# Patient Record
Sex: Female | Born: 1965 | Race: White | Hispanic: No | Marital: Married | Smoking: Current every day smoker
Health system: Southern US, Community
[De-identification: ages and names within clinical notes are randomized; demographics above are authoritative.]

## PROBLEM LIST (undated history)

## (undated) DIAGNOSIS — E559 Vitamin D deficiency, unspecified: Secondary | ICD-10-CM

## (undated) DIAGNOSIS — M26609 Unspecified temporomandibular joint disorder, unspecified side: Secondary | ICD-10-CM

## (undated) DIAGNOSIS — R232 Flushing: Secondary | ICD-10-CM

## (undated) DIAGNOSIS — F17200 Nicotine dependence, unspecified, uncomplicated: Secondary | ICD-10-CM

## (undated) DIAGNOSIS — M79659 Pain in unspecified thigh: Secondary | ICD-10-CM

## (undated) DIAGNOSIS — J189 Pneumonia, unspecified organism: Secondary | ICD-10-CM

## (undated) DIAGNOSIS — M2550 Pain in unspecified joint: Secondary | ICD-10-CM

## (undated) DIAGNOSIS — G4730 Sleep apnea, unspecified: Secondary | ICD-10-CM

## (undated) DIAGNOSIS — F32A Depression, unspecified: Secondary | ICD-10-CM

## (undated) DIAGNOSIS — K219 Gastro-esophageal reflux disease without esophagitis: Secondary | ICD-10-CM

## (undated) DIAGNOSIS — J309 Allergic rhinitis, unspecified: Secondary | ICD-10-CM

## (undated) DIAGNOSIS — E785 Hyperlipidemia, unspecified: Secondary | ICD-10-CM

## (undated) DIAGNOSIS — K589 Irritable bowel syndrome without diarrhea: Secondary | ICD-10-CM

## (undated) DIAGNOSIS — R202 Paresthesia of skin: Secondary | ICD-10-CM

## (undated) DIAGNOSIS — E119 Type 2 diabetes mellitus without complications: Secondary | ICD-10-CM

## (undated) DIAGNOSIS — I10 Essential (primary) hypertension: Secondary | ICD-10-CM

## (undated) DIAGNOSIS — M7918 Myalgia, other site: Secondary | ICD-10-CM

## (undated) DIAGNOSIS — K760 Fatty (change of) liver, not elsewhere classified: Secondary | ICD-10-CM

## (undated) DIAGNOSIS — R200 Anesthesia of skin: Secondary | ICD-10-CM

## (undated) DIAGNOSIS — M1990 Unspecified osteoarthritis, unspecified site: Secondary | ICD-10-CM

## (undated) DIAGNOSIS — K5900 Constipation, unspecified: Secondary | ICD-10-CM

## (undated) DIAGNOSIS — K143 Hypertrophy of tongue papillae: Secondary | ICD-10-CM

## (undated) DIAGNOSIS — M5432 Sciatica, left side: Secondary | ICD-10-CM

## (undated) DIAGNOSIS — K449 Diaphragmatic hernia without obstruction or gangrene: Secondary | ICD-10-CM

## (undated) DIAGNOSIS — M549 Dorsalgia, unspecified: Secondary | ICD-10-CM

## (undated) HISTORY — DX: Nicotine dependence, unspecified, uncomplicated: F17.200

## (undated) HISTORY — PX: BREAST BIOPSY: SHX20

## (undated) HISTORY — DX: Fatty (change of) liver, not elsewhere classified: K76.0

## (undated) HISTORY — PX: BREAST EXCISIONAL BIOPSY: SUR124

## (undated) HISTORY — PX: LAPAROSCOPIC NISSEN FUNDOPLICATION: SHX1932

## (undated) HISTORY — PX: ESOPHAGEAL MANOMETRY: SHX5429

## (undated) HISTORY — PX: CERVICAL FUSION: SHX112

## (undated) HISTORY — DX: Type 2 diabetes mellitus without complications: E11.9

## (undated) HISTORY — PX: COLONOSCOPY: SHX174

## (undated) HISTORY — PX: HERNIA REPAIR: SHX51

## (undated) HISTORY — PX: TONSILLECTOMY AND ADENOIDECTOMY: SUR1326

## (undated) HISTORY — DX: Diaphragmatic hernia without obstruction or gangrene: K44.9

## (undated) HISTORY — DX: Vitamin D deficiency, unspecified: E55.9

## (undated) HISTORY — PX: ESOPHAGOGASTRODUODENOSCOPY: SHX1529

## (undated) HISTORY — DX: Gastro-esophageal reflux disease without esophagitis: K21.9

## (undated) HISTORY — DX: Unspecified temporomandibular joint disorder, unspecified side: M26.609

## (undated) HISTORY — PX: BREAST SURGERY: SHX581

## (undated) HISTORY — DX: Constipation, unspecified: K59.00

## (undated) HISTORY — PX: GASTRIC FUNDOPLICATION: SHX226

## (undated) HISTORY — DX: Dorsalgia, unspecified: M54.9

## (undated) HISTORY — PX: DILATION AND CURETTAGE OF UTERUS: SHX78

## (undated) HISTORY — DX: Essential (primary) hypertension: I10

## (undated) HISTORY — DX: Sciatica, left side: M54.32

## (undated) HISTORY — PX: CARPAL TUNNEL RELEASE: SHX101

## (undated) HISTORY — PX: LUMBAR DISC SURGERY: SHX700

## (undated) HISTORY — DX: Pain in unspecified joint: M25.50

## (undated) HISTORY — PX: UPPER GASTROINTESTINAL ENDOSCOPY: SHX188

## (undated) HISTORY — DX: Flushing: R23.2

## (undated) HISTORY — DX: Hyperlipidemia, unspecified: E78.5

## (undated) HISTORY — PX: APPENDECTOMY: SHX54

## (undated) HISTORY — DX: Unspecified osteoarthritis, unspecified site: M19.90

## (undated) HISTORY — DX: Allergic rhinitis, unspecified: J30.9

## (undated) HISTORY — DX: Irritable bowel syndrome, unspecified: K58.9

## (undated) HISTORY — DX: Hypertrophy of tongue papillae: K14.3

---

## 1999-10-26 ENCOUNTER — Encounter: Payer: Self-pay | Admitting: Neurosurgery

## 1999-10-26 ENCOUNTER — Inpatient Hospital Stay (HOSPITAL_COMMUNITY): Admission: RE | Admit: 1999-10-26 | Discharge: 1999-10-27 | Payer: Self-pay | Admitting: Neurosurgery

## 2003-12-12 HISTORY — PX: LUMBAR LAMINECTOMY: SHX95

## 2004-08-27 ENCOUNTER — Ambulatory Visit (HOSPITAL_COMMUNITY): Admission: RE | Admit: 2004-08-27 | Discharge: 2004-08-27 | Payer: Self-pay | Admitting: Family Medicine

## 2004-11-08 ENCOUNTER — Other Ambulatory Visit: Admission: RE | Admit: 2004-11-08 | Discharge: 2004-11-08 | Payer: Self-pay | Admitting: Family Medicine

## 2005-08-08 ENCOUNTER — Other Ambulatory Visit: Admission: RE | Admit: 2005-08-08 | Discharge: 2005-08-08 | Payer: Self-pay | Admitting: Obstetrics and Gynecology

## 2005-08-28 ENCOUNTER — Encounter: Admission: RE | Admit: 2005-08-28 | Discharge: 2005-08-28 | Payer: Self-pay | Admitting: General Surgery

## 2005-08-28 ENCOUNTER — Encounter (INDEPENDENT_AMBULATORY_CARE_PROVIDER_SITE_OTHER): Payer: Self-pay | Admitting: *Deleted

## 2006-08-06 ENCOUNTER — Ambulatory Visit (HOSPITAL_COMMUNITY): Admission: RE | Admit: 2006-08-06 | Discharge: 2006-08-07 | Payer: Self-pay | Admitting: Neurological Surgery

## 2006-10-04 ENCOUNTER — Ambulatory Visit (HOSPITAL_COMMUNITY): Admission: RE | Admit: 2006-10-04 | Discharge: 2006-10-04 | Payer: Self-pay | Admitting: Neurological Surgery

## 2007-10-13 ENCOUNTER — Encounter (INDEPENDENT_AMBULATORY_CARE_PROVIDER_SITE_OTHER): Payer: Self-pay | Admitting: General Surgery

## 2007-10-13 ENCOUNTER — Observation Stay (HOSPITAL_COMMUNITY): Admission: EM | Admit: 2007-10-13 | Discharge: 2007-10-14 | Payer: Self-pay | Admitting: Emergency Medicine

## 2008-12-01 ENCOUNTER — Ambulatory Visit: Payer: Self-pay | Admitting: Internal Medicine

## 2008-12-01 DIAGNOSIS — F17200 Nicotine dependence, unspecified, uncomplicated: Secondary | ICD-10-CM | POA: Insufficient documentation

## 2008-12-01 DIAGNOSIS — M1990 Unspecified osteoarthritis, unspecified site: Secondary | ICD-10-CM | POA: Insufficient documentation

## 2008-12-01 DIAGNOSIS — K219 Gastro-esophageal reflux disease without esophagitis: Secondary | ICD-10-CM

## 2008-12-01 DIAGNOSIS — J069 Acute upper respiratory infection, unspecified: Secondary | ICD-10-CM | POA: Insufficient documentation

## 2008-12-01 HISTORY — DX: Unspecified osteoarthritis, unspecified site: M19.90

## 2008-12-01 HISTORY — DX: Nicotine dependence, unspecified, uncomplicated: F17.200

## 2008-12-01 HISTORY — DX: Gastro-esophageal reflux disease without esophagitis: K21.9

## 2008-12-07 LAB — CONVERTED CEMR LAB
ALT: 20 units/L (ref 0–35)
AST: 21 units/L (ref 0–37)
Albumin: 4.4 g/dL (ref 3.5–5.2)
Alkaline Phosphatase: 62 units/L (ref 39–117)
BUN: 11 mg/dL (ref 6–23)
Basophils Absolute: 0 10*3/uL (ref 0.0–0.1)
Basophils Relative: 0.3 % (ref 0.0–3.0)
Bilirubin, Direct: 0.1 mg/dL (ref 0.0–0.3)
CO2: 28 meq/L (ref 19–32)
Calcium: 9.6 mg/dL (ref 8.4–10.5)
Chloride: 105 meq/L (ref 96–112)
Cholesterol: 213 mg/dL (ref 0–200)
Creatinine, Ser: 0.7 mg/dL (ref 0.4–1.2)
Direct LDL: 149.3 mg/dL
Eosinophils Absolute: 0.1 10*3/uL (ref 0.0–0.7)
Eosinophils Relative: 1 % (ref 0.0–5.0)
GFR calc Af Amer: 117 mL/min
GFR calc non Af Amer: 97 mL/min
Glucose, Bld: 96 mg/dL (ref 70–99)
HCT: 41.8 % (ref 36.0–46.0)
Hemoglobin: 14.3 g/dL (ref 12.0–15.0)
Lymphocytes Relative: 27.1 % (ref 12.0–46.0)
MCHC: 34.3 g/dL (ref 30.0–36.0)
MCV: 95.2 fL (ref 78.0–100.0)
Monocytes Absolute: 0.6 10*3/uL (ref 0.1–1.0)
Monocytes Relative: 5.3 % (ref 3.0–12.0)
Neutro Abs: 8.1 10*3/uL — ABNORMAL HIGH (ref 1.4–7.7)
Neutrophils Relative %: 66.3 % (ref 43.0–77.0)
Platelets: 228 10*3/uL (ref 150–400)
Potassium: 3.9 meq/L (ref 3.5–5.1)
RBC: 4.39 M/uL (ref 3.87–5.11)
RDW: 13 % (ref 11.5–14.6)
Sodium: 142 meq/L (ref 135–145)
TSH: 0.5 microintl units/mL (ref 0.35–5.50)
Total Bilirubin: 0.8 mg/dL (ref 0.3–1.2)
Total Protein: 7.5 g/dL (ref 6.0–8.3)
WBC: 12.1 10*3/uL — ABNORMAL HIGH (ref 4.5–10.5)

## 2008-12-08 ENCOUNTER — Ambulatory Visit: Payer: Self-pay | Admitting: Family Medicine

## 2008-12-08 DIAGNOSIS — M26609 Unspecified temporomandibular joint disorder, unspecified side: Secondary | ICD-10-CM

## 2008-12-08 HISTORY — DX: Unspecified temporomandibular joint disorder, unspecified side: M26.609

## 2009-04-06 ENCOUNTER — Ambulatory Visit: Payer: Self-pay | Admitting: Internal Medicine

## 2009-04-06 DIAGNOSIS — R5381 Other malaise: Secondary | ICD-10-CM | POA: Insufficient documentation

## 2009-04-06 DIAGNOSIS — R5383 Other fatigue: Secondary | ICD-10-CM

## 2009-04-07 ENCOUNTER — Telehealth: Payer: Self-pay | Admitting: Internal Medicine

## 2009-04-08 LAB — CONVERTED CEMR LAB
Basophils Absolute: 0 10*3/uL (ref 0.0–0.1)
Basophils Relative: 0 % (ref 0.0–3.0)
Eosinophils Absolute: 0.2 10*3/uL (ref 0.0–0.7)
Eosinophils Relative: 2 % (ref 0.0–5.0)
HCT: 37.9 % (ref 36.0–46.0)
Hemoglobin: 13.2 g/dL (ref 12.0–15.0)
Lymphocytes Relative: 40.8 % (ref 12.0–46.0)
Lymphs Abs: 4.2 10*3/uL — ABNORMAL HIGH (ref 0.7–4.0)
MCHC: 34.9 g/dL (ref 30.0–36.0)
MCV: 93.4 fL (ref 78.0–100.0)
Monocytes Absolute: 0.7 10*3/uL (ref 0.1–1.0)
Monocytes Relative: 6.9 % (ref 3.0–12.0)
Neutro Abs: 5.3 10*3/uL (ref 1.4–7.7)
Neutrophils Relative %: 50.3 % (ref 43.0–77.0)
Platelets: 222 10*3/uL (ref 150.0–400.0)
RBC: 4.06 M/uL (ref 3.87–5.11)
RDW: 12.6 % (ref 11.5–14.6)
TSH: 0.95 microintl units/mL (ref 0.35–5.50)
WBC: 10.4 10*3/uL (ref 4.5–10.5)

## 2009-04-19 ENCOUNTER — Encounter: Payer: Self-pay | Admitting: Internal Medicine

## 2009-05-05 ENCOUNTER — Telehealth: Payer: Self-pay | Admitting: Internal Medicine

## 2009-10-18 ENCOUNTER — Ambulatory Visit: Payer: Self-pay | Admitting: Internal Medicine

## 2009-10-18 LAB — CONVERTED CEMR LAB
Bilirubin Urine: NEGATIVE
Blood in Urine, dipstick: NEGATIVE
Glucose, Urine, Semiquant: NEGATIVE
Ketones, urine, test strip: NEGATIVE
Nitrite: NEGATIVE
Protein, U semiquant: NEGATIVE
Specific Gravity, Urine: <1.005
Urobilinogen, UA: 0.2
WBC Urine, dipstick: NEGATIVE
pH: 6

## 2009-10-20 ENCOUNTER — Encounter: Payer: Self-pay | Admitting: Internal Medicine

## 2009-10-20 ENCOUNTER — Telehealth: Payer: Self-pay | Admitting: Internal Medicine

## 2009-11-02 ENCOUNTER — Ambulatory Visit: Payer: Self-pay | Admitting: Internal Medicine

## 2009-11-03 ENCOUNTER — Telehealth: Payer: Self-pay | Admitting: Internal Medicine

## 2010-01-24 ENCOUNTER — Ambulatory Visit: Payer: Self-pay | Admitting: Internal Medicine

## 2010-01-24 DIAGNOSIS — M549 Dorsalgia, unspecified: Secondary | ICD-10-CM

## 2010-01-24 HISTORY — DX: Dorsalgia, unspecified: M54.9

## 2010-02-03 ENCOUNTER — Encounter: Payer: Self-pay | Admitting: Internal Medicine

## 2010-02-08 ENCOUNTER — Encounter: Payer: Self-pay | Admitting: Internal Medicine

## 2010-03-08 ENCOUNTER — Telehealth: Payer: Self-pay | Admitting: Internal Medicine

## 2010-03-14 ENCOUNTER — Ambulatory Visit: Payer: Self-pay | Admitting: Family Medicine

## 2010-03-14 DIAGNOSIS — J309 Allergic rhinitis, unspecified: Secondary | ICD-10-CM | POA: Insufficient documentation

## 2010-03-14 DIAGNOSIS — K143 Hypertrophy of tongue papillae: Secondary | ICD-10-CM

## 2010-03-14 HISTORY — DX: Allergic rhinitis, unspecified: J30.9

## 2010-03-14 HISTORY — DX: Hypertrophy of tongue papillae: K14.3

## 2010-03-14 LAB — CONVERTED CEMR LAB: Vitamin B-12: 256 pg/mL (ref 211–911)

## 2010-03-17 LAB — CONVERTED CEMR LAB: Vit D, 25-Hydroxy: 25 ng/mL — ABNORMAL LOW (ref 30–89)

## 2010-04-04 ENCOUNTER — Ambulatory Visit (HOSPITAL_COMMUNITY): Admission: RE | Admit: 2010-04-04 | Discharge: 2010-04-04 | Payer: Self-pay | Admitting: Neurological Surgery

## 2010-05-23 ENCOUNTER — Ambulatory Visit: Payer: Self-pay | Admitting: Internal Medicine

## 2010-05-23 ENCOUNTER — Encounter (INDEPENDENT_AMBULATORY_CARE_PROVIDER_SITE_OTHER): Payer: Self-pay | Admitting: *Deleted

## 2010-05-23 DIAGNOSIS — E559 Vitamin D deficiency, unspecified: Secondary | ICD-10-CM

## 2010-05-23 HISTORY — DX: Vitamin D deficiency, unspecified: E55.9

## 2010-05-23 LAB — CONVERTED CEMR LAB: Vit D, 25-Hydroxy: 36 ng/mL (ref 30–89)

## 2010-05-27 ENCOUNTER — Ambulatory Visit: Payer: Self-pay | Admitting: Gastroenterology

## 2010-05-27 DIAGNOSIS — K449 Diaphragmatic hernia without obstruction or gangrene: Secondary | ICD-10-CM | POA: Insufficient documentation

## 2010-05-27 HISTORY — DX: Diaphragmatic hernia without obstruction or gangrene: K44.9

## 2010-05-31 ENCOUNTER — Ambulatory Visit (HOSPITAL_COMMUNITY): Admission: RE | Admit: 2010-05-31 | Discharge: 2010-05-31 | Payer: Self-pay | Admitting: Gastroenterology

## 2010-06-22 ENCOUNTER — Encounter: Payer: Self-pay | Admitting: Internal Medicine

## 2010-06-23 ENCOUNTER — Telehealth: Payer: Self-pay | Admitting: Gastroenterology

## 2010-07-09 ENCOUNTER — Telehealth: Payer: Self-pay | Admitting: Physician Assistant

## 2010-07-12 ENCOUNTER — Ambulatory Visit (HOSPITAL_COMMUNITY): Admission: RE | Admit: 2010-07-12 | Discharge: 2010-07-12 | Payer: Self-pay | Admitting: Gastroenterology

## 2010-07-13 ENCOUNTER — Telehealth: Payer: Self-pay | Admitting: Gastroenterology

## 2010-07-21 ENCOUNTER — Encounter (INDEPENDENT_AMBULATORY_CARE_PROVIDER_SITE_OTHER): Payer: Self-pay | Admitting: *Deleted

## 2010-07-21 ENCOUNTER — Ambulatory Visit: Payer: Self-pay | Admitting: Gastroenterology

## 2010-07-22 ENCOUNTER — Ambulatory Visit: Payer: Self-pay | Admitting: Internal Medicine

## 2010-07-22 LAB — CONVERTED CEMR LAB
ALT: 24 units/L (ref 0–35)
AST: 21 units/L (ref 0–37)
Albumin: 4.2 g/dL (ref 3.5–5.2)
Alkaline Phosphatase: 57 units/L (ref 39–117)
BUN: 12 mg/dL (ref 6–23)
Basophils Absolute: 0.1 10*3/uL (ref 0.0–0.1)
Basophils Relative: 0.4 % (ref 0.0–3.0)
Bilirubin Urine: NEGATIVE
Bilirubin, Direct: 0.1 mg/dL (ref 0.0–0.3)
CO2: 28 meq/L (ref 19–32)
Calcium: 9.7 mg/dL (ref 8.4–10.5)
Chloride: 105 meq/L (ref 96–112)
Cholesterol: 203 mg/dL — ABNORMAL HIGH (ref 0–200)
Creatinine, Ser: 0.7 mg/dL (ref 0.4–1.2)
Direct LDL: 153.7 mg/dL
Eosinophils Absolute: 0.1 10*3/uL (ref 0.0–0.7)
Eosinophils Relative: 0.6 % (ref 0.0–5.0)
GFR calc non Af Amer: 94.43 mL/min (ref >60)
Glucose, Bld: 89 mg/dL (ref 70–99)
Glucose, Urine, Semiquant: NEGATIVE
HCT: 41.1 % (ref 36.0–46.0)
HDL: 36.1 mg/dL — ABNORMAL LOW (ref >39.00)
Hemoglobin: 14 g/dL (ref 12.0–15.0)
Ketones, urine, test strip: NEGATIVE
Lymphocytes Relative: 32.6 % (ref 12.0–46.0)
Lymphs Abs: 4.6 10*3/uL — ABNORMAL HIGH (ref 0.7–4.0)
MCHC: 34.1 g/dL (ref 30.0–36.0)
MCV: 96 fL (ref 78.0–100.0)
Monocytes Absolute: 0.7 10*3/uL (ref 0.1–1.0)
Monocytes Relative: 5.3 % (ref 3.0–12.0)
Neutro Abs: 8.6 10*3/uL — ABNORMAL HIGH (ref 1.4–7.7)
Neutrophils Relative %: 61.1 % (ref 43.0–77.0)
Nitrite: NEGATIVE
Platelets: 230 10*3/uL (ref 150.0–400.0)
Potassium: 4.7 meq/L (ref 3.5–5.1)
Protein, U semiquant: NEGATIVE
RBC: 4.28 M/uL (ref 3.87–5.11)
RDW: 14.3 % (ref 11.5–14.6)
Sodium: 142 meq/L (ref 135–145)
Specific Gravity, Urine: 1.02
TSH: 1.16 microintl units/mL (ref 0.35–5.50)
Total Bilirubin: 0.5 mg/dL (ref 0.3–1.2)
Total CHOL/HDL Ratio: 6
Total Protein: 7 g/dL (ref 6.0–8.3)
Triglycerides: 204 mg/dL — ABNORMAL HIGH (ref 0.0–149.0)
Urobilinogen, UA: 0.2
VLDL: 40.8 mg/dL — ABNORMAL HIGH (ref 0.0–40.0)
WBC Urine, dipstick: NEGATIVE
WBC: 14 10*3/uL — ABNORMAL HIGH (ref 4.5–10.5)
pH: 7

## 2010-07-26 ENCOUNTER — Ambulatory Visit (HOSPITAL_COMMUNITY): Admission: RE | Admit: 2010-07-26 | Discharge: 2010-07-26 | Payer: Self-pay | Admitting: Gastroenterology

## 2010-07-26 ENCOUNTER — Ambulatory Visit: Payer: Self-pay | Admitting: Gastroenterology

## 2010-07-29 ENCOUNTER — Ambulatory Visit: Payer: Self-pay | Admitting: Internal Medicine

## 2010-08-19 ENCOUNTER — Encounter: Payer: Self-pay | Admitting: Gastroenterology

## 2010-08-24 ENCOUNTER — Encounter: Payer: Self-pay | Admitting: Gastroenterology

## 2010-08-29 ENCOUNTER — Encounter
Admission: RE | Admit: 2010-08-29 | Discharge: 2010-11-27 | Payer: Self-pay | Source: Home / Self Care | Attending: Neurological Surgery | Admitting: Neurological Surgery

## 2010-09-22 ENCOUNTER — Ambulatory Visit: Payer: Self-pay | Admitting: Internal Medicine

## 2010-09-26 ENCOUNTER — Ambulatory Visit (HOSPITAL_COMMUNITY): Admission: RE | Admit: 2010-09-26 | Discharge: 2010-09-26 | Payer: Self-pay | Admitting: Surgery

## 2010-09-28 ENCOUNTER — Telehealth: Payer: Self-pay | Admitting: Internal Medicine

## 2010-10-07 ENCOUNTER — Telehealth: Payer: Self-pay | Admitting: Internal Medicine

## 2010-10-26 ENCOUNTER — Encounter: Payer: Self-pay | Admitting: Gastroenterology

## 2010-11-23 ENCOUNTER — Inpatient Hospital Stay (HOSPITAL_COMMUNITY)
Admission: RE | Admit: 2010-11-23 | Discharge: 2010-11-26 | Payer: Self-pay | Source: Home / Self Care | Attending: Surgery | Admitting: Surgery

## 2010-12-15 ENCOUNTER — Encounter: Payer: Self-pay | Admitting: Internal Medicine

## 2011-01-10 NOTE — Letter (Signed)
Summary: Application for Wage Replacement/[employer]  Application for Wage Replacement/[employer]   Imported By: Maryln Gottron 02/09/2010 15:48:30  _____________________________________________________________________  External Attachment:    Type:   Image     Comment:   External Document

## 2011-01-10 NOTE — Progress Notes (Signed)
Summary: pt req out of work letter for rest of the week  Phone Note Call from Patient Call back at [employer] [phone]   Caller: Patient Summary of Call: Pt called and said that she is not feeling any better. Pt is wanting Dr. Amador Cunas to do an out of work letter for the rest of this week. Please call.  Initial call taken by: Lucy Antigua,  October 20, 2009 10:48 AM  Follow-up for Phone Call        not better- still having sweats, weak. Diarrhea better, still has cough. Want to return to work 11/15 if you will write her a note. Follow-up by: [name], RN,  October 20, 2009 11:25 AM    ok

## 2011-01-10 NOTE — Letter (Signed)
Summary: [facility]  [facility]   Imported By: Lester Trempealeau 11/11/2010 10:18:05  _____________________________________________________________________  External Attachment:    Type:   Image     Comment:   External Document

## 2011-01-10 NOTE — Letter (Signed)
Summary: Out of Work  Adult nurse at [employer]  [address]   [place], [place] [address]   Phone: [phone]  Fax: [phone]    October 20, 2009   Employee:  BRITTINY LEVITZ    To Whom It May Concern:   For Medical reasons, please excuse the above named employee from work for the following dates:  Start:   10-14-2009  End:   10-23-2009  If you need additional information, please feel free to contact our office.         Sincerely,    Gordy Savers  MD

## 2011-01-10 NOTE — Assessment & Plan Note (Signed)
Summary: acid reflux//ccm   Vital Signs:  Patient profile:   45 year old female Weight:      196 pounds Temp:     98.0 degrees F oral BP sitting:   100 / 70  (right arm) Cuff size:   regular  Vitals Entered By: Duard Brady LPN (May 23, 2010 11:05 AM) CC: c/o reflux getting worse, choking feeling Is Patient Diabetic? No   CC:  c/o reflux getting worse and choking feeling.  History of Present Illness: 45 year old patient who is seen today for follow-up.  She has multiple somatic complaints.  She does have a history of significant reflux.  Has had a difficult time getting preauthorization for Earna Coder had twice daily.  She has had ENT and GI evaluation in the past.  She is followed closely by neurosurgery due to cervical disk disease.  She has osteoarthritis and ongoing tobacco use.  Allergies: 1)  ! * Torecan  Past History:  Past Medical History: GERD since 1988  Osteoarthritis tobacco use Cervical fusion 07-2009  Past Surgical History: Appendectomy 2009 Lumpectomy 2001 Lumbar laminectomy 2000 Cervical fusion 2007, 07-2009 gravida two, para one, abortus one, status post C-section 1988  colonoscopy x 2 ( November 2007)- [facility]  Family History: Reviewed history from 12/01/2008 and no changes required.  father died age 47, complications of pneumonia ;history of EtOH mothered died age 72, history of coronary artery disease, breast cancer, hypertension, impaired glucose tolerance  A number of half-brothers and half-sisters from both her mother's and father's side of the family:  positive for colonic polyps, hypertension, alcoholism; coronary artery disease, peripheral vascular occlusive disease  Review of Systems       The patient complains of weight gain and severe indigestion/heartburn.  The patient denies anorexia, fever, weight loss, vision loss, decreased hearing, hoarseness, chest pain, syncope, dyspnea on exertion, peripheral edema, prolonged cough, headaches,  hemoptysis, abdominal pain, melena, hematochezia, hematuria, incontinence, genital sores, muscle weakness, suspicious skin lesions, transient blindness, difficulty walking, depression, unusual weight change, abnormal bleeding, enlarged lymph nodes, angioedema, and breast masses.    Physical Exam  General:  overweight-appearing.  normal  blood pressureoverweight-appearing.   Head:  Normocephalic and atraumatic without obvious abnormalities. No apparent alopecia or balding. Eyes:  No corneal or conjunctival inflammation noted. EOMI. Perrla. Funduscopic exam benign, without hemorrhages, exudates or papilledema. Vision grossly normal. Mouth:  Oral mucosa and oropharynx without lesions or exudates.  Teeth in good repair. Neck:  No deformities, masses, or tenderness noted. Lungs:  Normal respiratory effort, chest expands symmetrically. Lungs are clear to auscultation, no crackles or wheezes. Heart:  Normal rate and regular rhythm. S1 and S2 normal without gallop, murmur, click, rub or other extra sounds. Abdomen:  Bowel sounds positive,abdomen soft and non-tender without masses, organomegaly or hernias noted. Msk:  No deformity or scoliosis noted of thoracic or lumbar spine.   Pulses:  R and L carotid,radial,femoral,dorsalis pedis and posterior tibial pulses are full and equal bilaterally Extremities:  No clubbing, cyanosis, edema, or deformity noted with normal full range of motion of all joints.     Impression & Recommendations:  Problem # 1:  ALLERGIC RHINITIS (ICD-477.9)  The following medications were removed from the medication list:    Nasonex 50 Mcg/act Susp (Mometasone furoate) .Marland Kitchen... 2 sprays ea nostril two times a day Her updated medication list for this problem includes:    Fluticasone Propionate 50 Mcg/act Susp (Fluticasone propionate) ..... Use daily  The following medications were removed from the medication list:  Nasonex 50 Mcg/act Susp (Mometasone furoate) .Marland Kitchen... 2 sprays ea  nostril two times a day Her updated medication list for this problem includes:    Fluticasone Propionate 50 Mcg/act Susp (Fluticasone propionate) ..... Use daily  Problem # 2:  TOBACCO USE (ICD-305.1)  Problem # 3:  OSTEOARTHRITIS (ICD-715.90)  Her updated medication list for this problem includes:    Tramadol Hcl 50 Mg Tabs (Tramadol hcl) ..... One every 6 hours as needed for pain    Percocet 10-325 Mg Tabs (Oxycodone-acetaminophen) .Marland Kitchen... Prn  Her updated medication list for this problem includes:    Tramadol Hcl 50 Mg Tabs (Tramadol hcl) ..... One every 6 hours as needed for pain    Percocet 10-325 Mg Tabs (Oxycodone-acetaminophen) .Marland Kitchen... Prn  Problem # 4:  GERD (ICD-530.81)  Her updated medication list for this problem includes:    Zegerid 40-1100 Mg Caps (Omeprazole-sodium bicarbonate) .Marland Kitchen... 1 two times a day  Her updated medication list for this problem includes:    Zegerid 40-1100 Mg Caps (Omeprazole-sodium bicarbonate) .Marland Kitchen... 1 two times a day  Complete Medication List: 1)  Zegerid 40-1100 Mg Caps (Omeprazole-sodium bicarbonate) .Marland Kitchen.. 1 two times a day 2)  Diazepam 5 Mg Tabs (Diazepam) .... One at bedtime p.r.n. sleep 3)  Furosemide 20 Mg Tabs (Furosemide) .... One by mouth daily for swelling 4)  Tramadol Hcl 50 Mg Tabs (Tramadol hcl) .... One every 6 hours as needed for pain 5)  Percocet 10-325 Mg Tabs (Oxycodone-acetaminophen) .... Prn 6)  Soma 350 Mg Tabs (Carisoprodol) .... At bedtime prn 7)  Claritin-d 24 Hour 10-240 Mg Xr24h-tab (Loratadine-pseudoephedrine) .... Once daily as needed 8)  Vitamin D (ergocalciferol) 50000 Unit Caps (Ergocalciferol) .Marland Kitchen.. 1 by mouth qweek for 6 mos 9)  Fluticasone Propionate 50 Mcg/act Susp (Fluticasone propionate) .... Use daily  Other Orders: Venipuncture ([address]) T-Vitamin D (25-Hydroxy) [phone])  Patient Instructions: 1)  Please schedule a follow-up appointment in 4 months for annual exam 2)  Limit your Sodium (Salt) to less than  2 grams a day(slightly less than 1/2 a teaspoon) to prevent fluid retention, swelling, or worsening of symptoms. 3)  Avoid foods high in acid (tomatoes, citrus juices, spicy foods). Avoid eating within two hours of lying down or before exercising. Do not over eat; try smaller more frequent meals. Elevate head of bed twelve inches when sleeping. 4)  It is important that you exercise regularly at least 20 minutes 5 times a week. If you develop chest pain, have severe difficulty breathing, or feel very tired , stop exercising immediately and seek medical attention. 5)  You need to lose weight. Consider a lower calorie diet and regular exercise.  Prescriptions: FLUTICASONE PROPIONATE 50 MCG/ACT SUSP (FLUTICASONE PROPIONATE) use daily  #3 x 6   Entered and Authorized by:   Gordy Savers  MD   Signed by:   Gordy Savers  MD on 05/23/2010   Method used:   Print then Give to Patient   RxID:   [id number] VITAMIN D (ERGOCALCIFEROL) 50000 UNIT CAPS (ERGOCALCIFEROL) 1 by mouth qweek for 6 mos  #24 x 0   Entered and Authorized by:   Gordy Savers  MD   Signed by:   Gordy Savers  MD on 05/23/2010   Method used:   Print then Give to Patient   RxID:   [id number] TRAMADOL HCL 50 MG TABS (TRAMADOL HCL) one every 6 hours as needed for pain  #50 x 3   Entered and Authorized by:  Gordy Savers  MD   Signed by:   Gordy Savers  MD on 05/23/2010   Method used:   Print then Give to Patient   RxID:   [id number] FUROSEMIDE 20 MG TABS (FUROSEMIDE) one by mouth daily for swelling  #90 Tablet x 6   Entered and Authorized by:   Gordy Savers  MD   Signed by:   Gordy Savers  MD on 05/23/2010   Method used:   Print then Give to Patient   RxID:   [id number] DIAZEPAM 5 MG TABS (DIAZEPAM) one at bedtime p.r.n. sleep  #30 x 2   Entered and Authorized by:   Gordy Savers  MD   Signed by:   Gordy Savers  MD on 05/23/2010   Method used:    Print then Give to Patient   RxID:   [id number] ZEGERID 40-1100 MG CAPS (OMEPRAZOLE-SODIUM BICARBONATE) 1 two times a day  #180 Capsule x 6   Entered and Authorized by:   Gordy Savers  MD   Signed by:   Gordy Savers  MD on 05/23/2010   Method used:   Print then Give to Patient   RxID:   [id number]

## 2011-01-10 NOTE — Progress Notes (Signed)
Summary: please advise - needs abx  Phone Note Call from Patient Call back at Home Phone [phone] Call back at [phone]   Caller: Patient---left on voicemail @ 5:01 pm yesterday Summary of Call: was seen last week with bronchitis. was scheduled to have surgery today, because the bronchitis has not cleared up. pt is requesting a zpak. the other abx is not helping.  [facility]   ph----[phone]. please advise pt. on Muccinex too. Initial call taken by: Warnell Forester,  September 28, 2010 9:07 AM  Follow-up for Phone Call        z pack OK  Follow-up by: Gordy Savers  MD,  September 28, 2010 12:25 PM  Additional Follow-up for Phone Call Additional follow up Details #1::        changes med list - [facility] - [facility] aware -  pt now requesting cough med - can't sleep , using mucinex dm two times a day - not helping. KIK Additional Follow-up by: Duard Brady LPN,  September 28, 2010 1:03 PM    Additional Follow-up for Phone Call Additional follow up Details #2::    generic hydromet 6 oz one tsp every 6 hrs  for cough Follow-up by: Gordy Savers  MD,  September 29, 2010 11:08 AM  New/Updated Medications: ZITHROMAX 1 GM PACK (AZITHROMYCIN) take as diected HYDROMET 5-1.5 MG/5ML SYRP (HYDROCODONE-HOMATROPINE) 1 tsp every 6 hours pn cough Prescriptions: HYDROMET 5-1.5 MG/5ML SYRP (HYDROCODONE-HOMATROPINE) 1 tsp every 6 hours pn cough  #6oz x 0   Entered by:   Willy Eddy, LPN   Authorized by:   Gordy Savers  MD   Signed by:   Willy Eddy, LPN on 46/96/2952   Method used:   Telephoned to ...       [facility]  [address] [phone]* (retail)       [address]       [place], [place]  [address]       Ph: [phone] or [phone]       Fax: [phone]   RxID:   [id number] ZITHROMAX 1 GM PACK (AZITHROMYCIN) take as diected  #1 x 1   Entered by:   Duard Brady LPN   Authorized by:   Gordy Savers  MD  Signed by:   Duard Brady LPN on 09/32/3557   Method used:   Electronically to        [facility] [phone][facility] (retail)       [address]       [place], [place]  [address]       Ph: [phone] or [phone]       Fax: [phone]   RxID:   [id number]

## 2011-01-10 NOTE — Progress Notes (Signed)
Summary: REFILL  Phone Note Call from Patient Call back at [phone]   Caller: Patient-LIVE CALL Reason for Call: Refill Medication Summary of Call: RF DIAZEPAM . PT IS OUT. NEEDS TODAY. CALL [phone].  Initial call taken by: Warnell Forester,  November 03, 2009 4:49 PM    Prescriptions: DIAZEPAM 5 MG TABS (DIAZEPAM) one at bedtime p.r.n. sleep  #20 x 0   Entered by:   Lynann Beaver CMA   Authorized by:   Gordy Savers  MD   Signed by:   Lynann Beaver CMA on 11/03/2009   Method used:   Telephoned to ...       [facility] [phone]* (retail)       [address] [place]       [place], [place]  [address]       Ph: [phone] or [phone]       Fax: [phone]   RxID:   [id number]

## 2011-01-10 NOTE — Letter (Signed)
Summary: [facility]   Imported By: Lennie Odor 09/06/2010 14:51:17  _____________________________________________________________________  External Attachment:    Type:   Image     Comment:   External Document

## 2011-01-10 NOTE — Miscellaneous (Signed)
Summary: [facility] Previsit/prep  Clinical Lists Changes  Allergies: Changed allergy or adverse reaction from MORPHINE to MORPHINE Observations: Added new observation of ALLERGY REV: Done (07/21/2010 10:32)

## 2011-01-10 NOTE — Assessment & Plan Note (Signed)
Summary: feet swelling/tsh/njr   Vital Signs:  Patient profile:   45 year old female Weight:      194 pounds BMI:     32.65 Temp:     98.6 degrees F oral BP sitting:   124 / 86  (left arm) Cuff size:   regular  Vitals Entered By: [name], RN (April 06, 2009 4:26 PM)  CC:  C/o tired, sleeps a lot, and swollen hands & feet.Marland Kitchen  History of Present Illness: 45 year old female, who presents with a several week history of fatigue, myalgias, swelling, dry skin.  She states she has little energy, and hypersomnolence.  She also describes some left ear discomfort and tongue discomfort.  She continues to smoke.  She has a history of gastroesophageal reflux disease.  A TSH was normal in December of last year  Preventive Screening-Counseling & Management     Smoking Cessation Counseling: yes  Allergies: 1)  ! * Torecan  Review of Systems       The patient complains of weight gain and depression.    Physical Exam  General:  overweight-appearing.   Head:  Normocephalic and atraumatic without obvious abnormalities. No apparent alopecia or balding. Eyes:  No corneal or conjunctival inflammation noted. EOMI. Perrla. Funduscopic exam benign, without hemorrhages, exudates or papilledema. Vision grossly normal. Ears:  External ear exam shows no significant lesions or deformities.  Otoscopic examination reveals clear canals, tympanic membranes are intact bilaterally without bulging, retraction, inflammation or discharge. Hearing is grossly normal bilaterally. Mouth:  Oral mucosa and oropharynx without lesions or exudates.  Teeth in good repair. Neck:  No deformities, masses, or tenderness noted. Lungs:  Normal respiratory effort, chest expands symmetrically. Lungs are clear to auscultation, no crackles or wheezes. Heart:  Normal rate and regular rhythm. S1 and S2 normal without gallop, murmur, click, rub or other extra sounds. Abdomen:  Bowel sounds positive,abdomen soft and non-tender without  masses, organomegaly or hernias noted. Extremities:  hands appear to be a mildly erythematous Neurologic:  reflexes were probably normal.  Suggestion of a delayed relaxation phase   Impression & Recommendations:  Problem # 1:  TOBACCO USE (ICD-305.1)  Her updated medication list for this problem includes:    Chantix Starting Month Pak 0.5 Mg X 11 & 1 Mg X 42 Misc (Varenicline tartrate) ..... Uad  Her updated medication list for this problem includes:    Chantix Starting Month Pak 0.5 Mg X 11 & 1 Mg X 42 Misc (Varenicline tartrate) ..... Uad  Problem # 2:  GERD (ICD-530.81)  Her updated medication list for this problem includes:    Zegerid 40-1100 Mg Caps (Omeprazole-sodium bicarbonate) .Marland Kitchen... 1 two times a day  Her updated medication list for this problem includes:    Zegerid 40-1100 Mg Caps (Omeprazole-sodium bicarbonate) .Marland Kitchen... 1 two times a day  Problem # 3:  LASSITUDE (ICD-780.79)  Orders: Venipuncture (29518) TLB-TSH (Thyroid Stimulating Hormone) (84443-TSH) TLB-CBC Platelet - w/Differential (85025-CBCD) if TSH is normal will consider p.r.n. diuretic use for her mild edema.  Diet and exercise and weight loss.  All encouraged.  May consider treatment for depression.  If symptoms persist or worsen  Complete Medication List: 1)  Zegerid 40-1100 Mg Caps (Omeprazole-sodium bicarbonate) .Marland Kitchen.. 1 two times a day 2)  Diazepam 5 Mg Tabs (Diazepam) .... One at bedtime p.r.n. sleep 3)  Chantix Starting Month Pak 0.5 Mg X 11 & 1 Mg X 42 Misc (Varenicline tartrate) .... Uad  Patient Instructions: 1)  Tobacco is very  bad for your health and your loved ones! You Should stop smoking!. 2)  It is important that you exercise regularly at least 20 minutes 5 times a week. If you develop chest pain, have severe difficulty breathing, or feel very tired , stop exercising immediately and seek medical attention. 3)  You need to lose weight. Consider a lower calorie diet and regular exercise.

## 2011-01-10 NOTE — Assessment & Plan Note (Signed)
Summary: GERD....EM   History of Present Illness Visit Type: Initial Visit Primary GI MD: Melvia Heaps MD [facility] Primary Provider: Gordy Savers  MD Chief Complaint: Patient complains of increased reflux which she has seen three GI MDs for since 1991. She states that Zegerid works great but she knows that her insurance will not cover without a prior [place]. She complains of choking from the acid reflux.  History of Present Illness:   [name] is a 45 year old white female referred at the request of  Eleonore Chiquito, M.D.for evaluation of reflux.  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  She suffers from chronic hoarseness and coughing, especially if she discontinues her medicines.  She is now complaining of pressure in her upper chest and neck with and without swallowing suggestive of  globus.  She has undergone 2 cervical spine fusions.  She denies dysphagia, per se.  With high-dose PPI therapy  pyrosis and coughing are fairly well controlled.  She has undergone several endoscopies in the past and colonoscopy. She has taken various PPIs in the past as well.    GI Review of Systems    Reports abdominal pain, acid reflux, and  belching.     Location of  Abdominal pain: epigastric area.    Denies bloating, chest pain, dysphagia with liquids, dysphagia with solids, heartburn, loss of appetite, nausea, vomiting, vomiting blood, weight loss, and  weight gain.        Denies anal fissure, black tarry stools, change in bowel habit, constipation, diarrhea, diverticulosis, fecal incontinence, heme positive stool, hemorrhoids, irritable bowel syndrome, jaundice, light color stool, liver problems, rectal bleeding, and  rectal pain. Preventive Screening-Counseling & Management      Drug Use:  no.      Current Medications (verified): 1)  Zegerid Otc 20-1100 Mg Caps (Omeprazole-Sodium Bicarbonate) .... Take Two Tabs By Mouth Two Times A Day 2)  Diazepam 5 Mg Tabs (Diazepam)  .... One At Bedtime P.r.n. Sleep 3)  Furosemide 20 Mg Tabs (Furosemide) .... One By Mouth Daily For Swelling 4)  Percocet 10-325 Mg Tabs (Oxycodone-Acetaminophen) .... Prn 5)  Vitamin D (Ergocalciferol) 50000 Unit Caps (Ergocalciferol) .Marland Kitchen.. 1 By Mouth Qweek For 6 Mos  Allergies: 1)  ! * Torecan 2)  ! Morphine  Past History:  Past Medical History: GERD since 1988  Osteoarthritis tobacco use Cervical fusion 07-2009 hiatal hernia  Past Surgical History: Reviewed history from 05/23/2010 and no changes required. Appendectomy 2009 Lumpectomy 2001 Lumbar laminectomy 2000 Cervical fusion 2007, 07-2009 gravida two, para one, abortus one, status post C-section 1988  colonoscopy x 2 ( November 2007)- [facility]  Family History:  father died age 8, complications of pneumonia ;history of EtOH mothered died age 80, history of coronary artery disease, breast cancer, hypertension, impaired glucose tolerance A number of half-brothers and half-sisters from both her mother's and father's side of the family:  positive for colonic polyps, hypertension, alcoholism; coronary artery disease, peripheral vascular occlusive disease Family History of Colon Polyps: 1/2 brothers and sister  Social History: Married Current Smoker stepdaughter and stepson Alcohol Use - no Daily Caffeine Use 4-6 per day Illicit Drug Use - no Drug Use:  no  Review of Systems       The patient complains of allergy/sinus, anxiety-new, arthritis/joint pain, back pain, depression-new, fatigue, headaches-new, muscle pains/cramps, sleeping problems, and swelling of feet/legs.  The patient denies anemia, blood in urine, breast changes/lumps, change in vision, confusion, cough, coughing up blood, fainting, fever,  hearing problems, heart murmur, heart rhythm changes, itching, menstrual pain, night sweats, nosebleeds, pregnancy symptoms, shortness of breath, skin rash, sore throat, swollen lymph glands, thirst - excessive, urination -  excessive, urination changes/pain, urine leakage, vision changes, and voice change.         All other systems were reviewed and were negative   Vital Signs:  Patient profile:   45 year old female Height:      64.75 inches Weight:      194.0 pounds BMI:     32.65 Pulse rate:   80 / minute Pulse rhythm:   regular BP sitting:   124 / 80  (right arm) Cuff size:   regular  Vitals Entered By: Harlow Mares CMA Duncan Dull) (May 27, 2010 10:32 AM)  Physical Exam  Additional Exam:  She is a well-developed nourished female  skin: anicteric HEENT: normocephalic; PEERLA; no nasal or pharyngeal abnormalities neck: supple nodes: no cervical lymphadenopathy chest: clear to ausculatation and percussion heart: no murmurs, gallops, or rubs abd: soft, nontender; BS normoactive; no abdominal masses, tenderness, organomegaly rectal: deferred ext: no cynanosis, clubbing, edema skeletal: no deformities neuro: oriented x 3; no focal abnormalities    Impression & Recommendations:  Problem # 1:  GERD (ICD-530.81) Tthe patient is clearly PPI-dependent and probably symptomatic despite high-dose PPI therapy.  Her sensation of globus may be due to acid reflux or possible partial impingement of the cervical esophagus from her spine disease.  At any rate, I believe that she is an excellent candidate for fundoplication on the basis of the longevity of her symptoms requiring high-dose PPI therapy and possible persistence of symptoms contributing to her globus.  Accordingly, she will be referred to surgery, Dr. Wenda Low, for consideration of fundoplication.  In the interim she will continue Zegerid twice a day.  Dr. Daphine Deutscher can decide whether or not an esophageal manometry is required.  Problem # 2:  TMJ SYNDROME (ICD-524.60)  Patient Instructions: 1)  Copy sent to : Gordy Savers  MD, Rolan Bucco, MD 2)  We are referring you to Dr Luretha Murphy at [facility] on 06/22/2010 at 3:55 to arrive at 3:30pm 3)  Your  Upper GI series is scheduled at [facility] radiology on 05/31/2010 at 9am 4)  The medication list was reviewed and reconciled.  All changed / newly prescribed medications were explained.  A complete medication list was provided to the patient / caregiver.  Appended Document: Orders Update    Clinical Lists Changes  Problems: Added new problem of HIATAL HERNIA (ICD-553.3) Orders: Added new Test order of [facility] ([facility]) - Signed Added new Test order of [facility] ([facility]) - Signed

## 2011-01-10 NOTE — Progress Notes (Signed)
  Phone Note Call from Patient   Reason for Call: Talk to Doctor Summary of Call: PT IS SCHEDULED FOR MANOMETRY AND PH STUDY ON 8/2 AND IS OFF HER PPI-HAVING HORRIBLE HEARTBURN AND WANTED TO KNOE IF IT IS OK TO TAKE VINEGAR ?   SOUNDS AWFUL BUT HAS HELPED HER IN THE PAST-ADVISED OK TO TAKE. Initial call taken by: Sammuel Cooper PA-c,  July 09, 2010 2:19 PM

## 2011-01-10 NOTE — Procedures (Signed)
Summary: Medication list/[facility]  Medication list/ Endoscopy   Imported By: Sherian Rein 07/25/2010 09:39:26  _____________________________________________________________________  External Attachment:    Type:   Image     Comment:   External Document

## 2011-01-10 NOTE — Procedures (Signed)
Summary: Upper Endoscopy  Patient: Daun Rens Note: All result statuses are Final unless otherwise noted.  Tests: (1) Upper Endoscopy (EGD)   EGD Upper Endoscopy       DONE     [facility]     [address] [place], [place]  [address]           ENDOSCOPY PROCEDURE REPORT           PATIENT:  Olga, Bourbeau  MR#:  [id number]     BIRTHDATE:  04/21/1966, 44 yrs. old  GENDER:  female           ENDOSCOPIST:  Barbette Hair. Arlyce Dice, MD     Referred by:           PROCEDURE DATE:  07/26/2010     PROCEDURE:  Bravo pH probe placement     ASA CLASS:  Class I     INDICATIONS:  GERD           MEDICATIONS:   Fentanyl 150 mg, Versed 12 mg, Benadryl 50 mg     TOPICAL ANESTHETIC:  Cetacaine Spray           DESCRIPTION OF PROCEDURE:   After the risks benefits and     alternatives of the procedure were thoroughly explained, informed     consent was obtained.  The EG-2990i ([id number]) endoscope was     introduced through the mouth and advanced to the third portion of     the duodenum, without limitations.  The instrument was slowly     withdrawn as the mucosa was fully examined.     <<PROCEDUREIMAGES>>           Few erosions were found in the bulb of the duodenum (see     image001). Several superficial erosions  Otherwise the examination     was normal. GE junction at 39 cm from incisors. A Bravo pH monitor     was attached to the esophageal mucosa. Following removal of scope     Bravo pH monitor was placed.     Esophageal manometry tube was placed subsequently and knocked off     pH probe.    Retroflexed views revealed no abnormalities.    The     scope was then withdrawn from the patient and the procedure     completed.           COMPLICATIONS:  None           ENDOSCOPIC IMPRESSION:     1) Mild erosive duodenitis     2) s/p placement, then dislodgement of pH probe     3) s/p manometry tube placement     RECOMMENDATIONS:Await manometry results           REPEAT EXAM:  No           ______________________________     Barbette Hair. Arlyce Dice, MD           CC:  Luretha Murphy, MD, Gordy Savers, MD           n.     eSIGNED:   Barbette Hair. Tomekia Helton at 07/26/2010 09:35 AM           Tye Savoy, [id number]  Note: An exclamation mark (!) indicates a result that was not dispersed into the flowsheet. Document Creation Date: 07/26/2010 9:36 AM _______________________________________________________________________  (1) Order result status: Final Collection or observation date-time: 07/26/2010 09:28 Requested date-time:  Receipt date-time:  Reported  date-time:  Referring Physician:   Ordering Physician: Melvia Heaps [phone]) Specimen Source:  Source: Launa Grill Order Number: [id number] Lab site:

## 2011-01-10 NOTE — Assessment & Plan Note (Signed)
Summary: BACK PAIN // RS   Vital Signs:  Patient profile:   45 year old female Weight:      193 pounds Temp:     98.5 degrees F oral BP sitting:   120 / 70  (right arm) Cuff size:   regular  Vitals Entered By: Duard Brady LPN (January 24, 2010 3:33 PM) CC: c/o (r) arm and shoulder pain, no injury notied , x1wk   CC:  c/o (r) arm and shoulder pain, no injury notied , and x1wk.  History of Present Illness: 45 year old patient, who presents with a one-week history of pain in the left upper back area just medial to the scapula.  She has been using the diazepam and ibuprofen without much benefit.  She works on [employer] and requires considerable physical activity with her job.  She describes a making this in the interscapular area, and a sense of discomfort  Allergies: 1)  ! * Torecan  Review of Systems  The patient denies anorexia, fever, weight loss, weight gain, vision loss, decreased hearing, hoarseness, chest pain, syncope, dyspnea on exertion, peripheral edema, prolonged cough, headaches, hemoptysis, abdominal pain, melena, hematochezia, severe indigestion/heartburn, hematuria, incontinence, genital sores, muscle weakness, suspicious skin lesions, transient blindness, difficulty walking, depression, unusual weight change, abnormal bleeding, enlarged lymph nodes, angioedema, and breast masses.    Physical Exam  General:  overweight-appearing.  overweight-appearing.   Msk:  range-of-motion of the left shoulder intact; minimal tenderness noted involving the area just medial to the scapula on the left;  twisting of the upper thorax clockwise did tend to aggravate the pain   Impression & Recommendations:  Problem # 1:  BACK PAIN (ICD-724.5)  Her updated medication list for this problem includes:    Tramadol Hcl 50 Mg Tabs (Tramadol hcl) ..... One every 6 hours as needed for pain  Her updated medication list for this problem includes:    Tramadol Hcl 50 Mg Tabs  (Tramadol hcl) ..... One every 6 hours as needed for pain  Problem # 2:  OSTEOARTHRITIS (ICD-715.90)  Her updated medication list for this problem includes:    Tramadol Hcl 50 Mg Tabs (Tramadol hcl) ..... One every 6 hours as needed for pain  Her updated medication list for this problem includes:    Tramadol Hcl 50 Mg Tabs (Tramadol hcl) ..... One every 6 hours as needed for pain  Complete Medication List: 1)  Zegerid 40-1100 Mg Caps (Omeprazole-sodium bicarbonate) .Marland Kitchen.. 1 two times a day 2)  Diazepam 5 Mg Tabs (Diazepam) .... One at bedtime p.r.n. sleep 3)  Nasonex 50 Mcg/act Susp (Mometasone furoate) .... 2 sprays ea nostril two times a day 4)  Mucinex Dm Maximum Strength 60-1200 Mg Xr12h-tab (Dextromethorphan-guaifenesin) .... One by mouth bid 5)  Furosemide 20 Mg Tabs (Furosemide) .... One by mouth daily for swelling 6)  Vitamin D (ergocalciferol) 50000 Unit Caps (Ergocalciferol) .... Twice weekly 7)  Tramadol Hcl 50 Mg Tabs (Tramadol hcl) .... One every 6 hours as needed for pain  Patient Instructions: 1)  You may move around but avoid painful motions. Apply  heat  to sore area for 20 minutes 3-4 times a day for 2-3 days. Prescriptions: TRAMADOL HCL 50 MG TABS (TRAMADOL HCL) one every 6 hours as needed for pain  #50 x 3   Entered and Authorized by:   Gordy Savers  MD   Signed by:   Gordy Savers  MD on 01/24/2010   Method used:   Print then Give to  Patient   RxID:   [id number] DIAZEPAM 5 MG TABS (DIAZEPAM) one at bedtime p.r.n. sleep  #30 x 2   Entered and Authorized by:   Gordy Savers  MD   Signed by:   Gordy Savers  MD on 01/24/2010   Method used:   Print then Give to Patient   RxID:   [id number]

## 2011-01-10 NOTE — Assessment & Plan Note (Signed)
Summary: oral thrush?  strept throat/dm   Vital Signs:  Patient Profile:   45 Years Old Female Height:     64.75 inches Weight:      189 pounds Temp:     98.2 degrees F oral BP sitting:   110 / 80  (left arm) Cuff size:   regular  Vitals Entered By: Kern Reap CMA (December 08, 2008 9:37 AM)                 Chief Complaint:  left ear pain and thrush.  History of Present Illness: Lori [name] is a 45 year old female, smoker, who comes in today for a 3 to 4-week history of left earache and possible thrush.  She states she saw Dr. Kirtland Bouchard. a week or so ago as a new primary care patient.  However, she's had this problem with her ear for quite awhile.  She describes it as sometimes sharp sometimes dull.  She points to her left TMJ as the source of her discomfort.  She also is a smoker and has irritation of her tongue    Prior Medication List:  ZEGERID 40-1100 MG CAPS (OMEPRAZOLE-SODIUM BICARBONATE) 1 two times a day DIAZEPAM 5 MG TABS (DIAZEPAM) one at bedtime p.r.n. sleep   Current Allergies (reviewed today): ! * TORECAN  Past Medical History:    Reviewed history from 12/01/2008 and no changes required:       GERD       Osteoarthritis       tobacco use   Social History:    Reviewed history from 12/01/2008 and no changes required:       Married       Current Smoker       60 year old daughter       stepson   Risk Factors:     Counseled to quit/cut down tobacco use:  yes   Review of Systems      See HPI   Physical Exam  General:     Well-developed,well-nourished,in no acute distress; alert,appropriate and cooperative throughout examination Head:     Normocephalic and atraumatic without obvious abnormalities. No apparent alopecia or balding. Eyes:     No corneal or conjunctival inflammation noted. EOMI. Perrla. Funduscopic exam benign, without hemorrhages, exudates or papilledema. Vision grossly normal. Ears:     External ear exam shows no significant lesions or  deformities.  Otoscopic examination reveals clear canals, tympanic membranes are intact bilaterally without bulging, retraction, inflammation or discharge. Hearing is grossly normal bilaterally. Nose:     External nasal examination shows no deformity or inflammation. Nasal mucosa are pink and moist without lesions or exudates. Mouth:     Oral mucosa and oropharynx without lesions or exudates.  Teeth in good repair. Neck:     tender to palpation left TMJ    Impression & Recommendations:  Problem # 1:  TMJ SYNDROME (ICD-524.60) Assessment: New  Orders: Tobacco use cessation intermediate 3-10 minutes ([address])   Problem # 2:  TOBACCO USE (ICD-305.1) Assessment: Unchanged  Orders: Tobacco use cessation intermediate 3-10 minutes ([address])  Her updated medication list for this problem includes:    Chantix Starting Month Pak 0.5 Mg X 11 & 1 Mg X 42 Misc (Varenicline tartrate) ..... Uad   Complete Medication List: 1)  Zegerid 40-1100 Mg Caps (Omeprazole-sodium bicarbonate) .Marland Kitchen.. 1 two times a day 2)  Diazepam 5 Mg Tabs (Diazepam) .... One at bedtime p.r.n. sleep 3)  Chantix Starting Month Pak 0.5 Mg X 11 & 1 Mg X  42 Misc (Varenicline tartrate) .... Uad   Patient Instructions: 1)  Tobacco is very bad for your health and your loved ones! You Should stop smoking!. 2)  Stop Smoking Tips: Choose a Quit date. Cut down before the Quit date. decide what you will do as a substitute when you feel the urge to smoke(gum,toothpick,exercise). 3)  begin the smoking cessation program as outlined. 4)  Platelet trend 800 mg twice a day with food.  Avoid chewing gum and things that are difficult to chew.  Return in 3 weeks to see Dr. Kirtland Bouchard for follow-up   Prescriptions: CHANTIX STARTING MONTH PAK 0.5 MG X 11 & 1 MG X 42 MISC (VARENICLINE TARTRATE) UAD  #1 x 0   Entered and Authorized by:   Roderick Pee MD   Signed by:   Roderick Pee MD on 12/08/2008   Method used:   Print then Give to Patient   RxID:    [id number]  ]

## 2011-01-10 NOTE — Letter (Signed)
Summary: [facility]   Imported By: Maryln Gottron 07/04/2010 15:54:37  _____________________________________________________________________  External Attachment:    Type:   Image     Comment:   External Document

## 2011-01-10 NOTE — Progress Notes (Signed)
  Phone Note Call from Patient Call back at Home Phone [phone]   Caller: Patient Call For: Gordy Savers  MD Summary of Call: Pt is still coughing and cannot have surgery until it stops.  Wants to know whether to take the refill on the Zpack?  Initial call taken by: Lynann Beaver CMA AAMA,  October 07, 2010 4:44 PM  Follow-up for Phone Call        not necessary- stop all tobacco Follow-up by: Gordy Savers  MD,  October 07, 2010 4:48 PM  Additional Follow-up for Phone Call Additional follow up Details #1::        Notified pt. Additional Follow-up by: Lynann Beaver CMA AAMA,  October 07, 2010 5:18 PM

## 2011-01-10 NOTE — Letter (Signed)
Summary: New Patient letter  [facility] [place] [facility]  [address] [place], [place] [address]   Phone: [phone]  Fax: [phone]       05/23/2010 MRN: [id number]  Lori [name] [address] [place], [place]  [address]  Dear Lori [name],  Welcome to the Gastroenterology Division at [facility].    You are scheduled to see Dr.  Melvia Heaps on May 27, 2010 at 10:00am on the 3rd floor at [facility], [address]  We ask that you try to arrive at our office 15 minutes prior to your appointment time to allow for check-in.  We would like you to complete the enclosed self-administered evaluation form prior to your visit and bring it with you on the day of your appointment.  We will review it with you.  Also, please bring a complete list of all your medications or, if you prefer, bring the medication bottles and we will list them.  Please bring your insurance card so that we may make a copy of it.  If your insurance requires a referral to see a specialist, please bring your referral form from your primary care physician.  Co-payments are due at the time of your visit and may be paid by cash, check or credit card.     Your office visit will consist of a consult with your physician (includes a physical exam), any laboratory testing he/she may order, scheduling of any necessary diagnostic testing (e.g. x-ray, ultrasound, CT-scan), and scheduling of a procedure (e.g. Endoscopy, Colonoscopy) if required.  Please allow enough time on your schedule to allow for any/all of these possibilities.    If you cannot keep your appointment, please call [phone] to cancel or reschedule prior to your appointment date.  This allows [name] the opportunity to schedule an appointment for another patient in need of care.  If you do not cancel or reschedule by 5 p.m. the business day prior to your appointment date, you will be charged a $50.00 late cancellation/no-show fee.    Thank you for choosing  [facility] for your medical needs.  We appreciate the opportunity to care for you.  Please visit [name] at our website  to learn more about our practice.                     Sincerely,                                                             The Gastroenterology Division

## 2011-01-10 NOTE — Progress Notes (Signed)
Summary: Manometry/24 hour PH Probe Ordered  Phone Note From Other Clinic   Caller: Misty Stanley w/Dr.Matt Daphine Deutscher at [facility]  [phone] Call For: Dr.Yatzary Merriweather Summary of Call: Pt. was seen for a [employer] consult, referred by Dr.Browning South[place]. Dr.Martin wants pt. to have a 24 hour PH probe and a Manometry.  She is scheduled at [facility] for 07-11-10 at 11:30am. Appt. information reviewed w/pt. by phone and mailed to her. Pt. instructed to call back as needed.  Initial call taken by: Laureen Ochs LPN,  June 23, 2010 3:02 PM     Appended Document: Manometry/24 hour [facility] Probe Ordered [facility] Booking# [id number].

## 2011-01-10 NOTE — Procedures (Signed)
Summary: Instructions for procedure/[place]  Instructions for procedure/[place]   Imported By: Sherian Rein 07/25/2010 09:38:33  _____________________________________________________________________  External Attachment:    Type:   Image     Comment:   External Document

## 2011-01-10 NOTE — Progress Notes (Signed)
Summary: oral thrush  Phone Note Call from Patient   Caller: Patient Call For: Gordy Savers  MD Summary of Call: Pt took steroids about 6 weeks ago, and was given steroids from Urgent Care.   She now has what sounds like oral thrush, sore and white tongue.   [facility]) [phone] Initial call taken by: Lynann Beaver CMA,  March 08, 2010 1:26 PM  Follow-up for Phone Call        [facility]  Magic mouthwash-  6 ounces, 1 teaspoon swish and swallow q.i.d., refill one Follow-up by: Gordy Savers  MD,  March 08, 2010 2:10 PM  Additional Follow-up for Phone Call Additional follow up Details #1::        Called to pharmacy and pt notified. Additional Follow-up by: Lynann Beaver CMA,  March 08, 2010 3:04 PM

## 2011-01-10 NOTE — Letter (Signed)
Summary: [facility]   Imported By: Lester Oktaha 09/07/2010 11:42:26  _____________________________________________________________________  External Attachment:    Type:   Image     Comment:   External Document

## 2011-01-10 NOTE — Assessment & Plan Note (Signed)
Summary: consult re: coating on tongue and cough/cjr   Vital Signs:  Patient profile:   45 year old female Weight:      191 pounds Temp:     98.7 degrees F oral BP sitting:   126 / 80  Vitals Entered By: Duard Brady LPN (March 14, 6044 10:47 AM) CC: c/o coating to tongue , has tried several meds to resolve , also c/o (R) ear pain, has neuro appt this friday Is Patient Diabetic? No   History of Present Illness: Here for several issues. First for 2 weeks she has had  a fuzzy coating over her tongue and a brown discoloration on it. No burning or discomfort. This started after she took a course of steroids for her back. she tried a Diflucan pill from her GYN doctor, and then uses Magic Mouthwash from Dr. Amador Cunas, but nothing seems to help. Second, she would like some labs drawn per her GYN, Dr. Tenny Craw. She recently had complete labs drawn there including a thyroid panel, BMET, and CBC. All of these were normal, but she still complains of some chronic fatigue. Lastly she has had stuffy head, ear discomfort, and sneezing for several weeks.   Allergies: 1)  ! * Torecan  Past History:  Past Medical History: Reviewed history from 12/01/2008 and no changes required. GERD Osteoarthritis tobacco use  Past Surgical History: Reviewed history from 12/01/2008 and no changes required. Appendectomy 2009 Lumpectomy 2001 Lumbar laminectomy 2000 Cervical fusion 2007 gravida two, para one, abortus one, status post C-section 1988  colonoscopy x 2 ( November 2007)  Review of Systems  The patient denies anorexia, fever, weight loss, weight gain, vision loss, decreased hearing, hoarseness, chest pain, syncope, dyspnea on exertion, peripheral edema, prolonged cough, headaches, hemoptysis, abdominal pain, melena, hematochezia, severe indigestion/heartburn, hematuria, incontinence, genital sores, muscle weakness, suspicious skin lesions, transient blindness, difficulty walking, depression,  unusual weight change, abnormal bleeding, enlarged lymph nodes, angioedema, breast masses, and testicular masses.    Physical Exam  General:  Well-developed,well-nourished,in no acute distress; alert,appropriate and cooperative throughout examination Head:  Normocephalic and atraumatic without obvious abnormalities. No apparent alopecia or balding. Eyes:  No corneal or conjunctival inflammation noted. EOMI. Perrla. Funduscopic exam benign, without hemorrhages, exudates or papilledema. Vision grossly normal. Ears:  External ear exam shows no significant lesions or deformities.  Otoscopic examination reveals clear canals, tympanic membranes are intact bilaterally without bulging, retraction, inflammation or discharge. Hearing is grossly normal bilaterally. Nose:  External nasal examination shows no deformity or inflammation. Nasal mucosa are pink and moist without lesions or exudates. Mouth:  the tongue has elongated papillae posteriorly and a brown discoloration Neck:  No deformities, masses, or tenderness noted. Lungs:  Normal respiratory effort, chest expands symmetrically. Lungs are clear to auscultation, no crackles or wheezes. Heart:  Normal rate and regular rhythm. S1 and S2 normal without gallop, murmur, click, rub or other extra sounds.   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 2 sprays ea nostril two times a day  Complete Medication List: 1)  Zegerid 40-1100 Mg Caps (Omeprazole-sodium bicarbonate) .Marland Kitchen.. 1 two times a day 2)  Diazepam 5 Mg Tabs (Diazepam) .... One at bedtime p.r.n. sleep 3)  Nasonex 50 Mcg/act Susp (Mometasone furoate)  .... 2 sprays ea nostril two times a day 4)  Mucinex Dm Maximum Strength 60-1200 Mg Xr12h-tab (Dextromethorphan-guaifenesin) .... One by mouth bid 5)  Furosemide 20 Mg Tabs (Furosemide) .... One by mouth daily for swelling 6)  Vitamin D (ergocalciferol) 50000 Unit Caps (Ergocalciferol) .... Twice weekly 7)  Tramadol Hcl 50 Mg Tabs (Tramadol hcl) .... One every 6 hours as needed for pain 8)  Percocet 10-325 Mg Tabs (Oxycodone-acetaminophen) .... Prn 9)  Soma 350 Mg Tabs (Carisoprodol) .... At bedtime prn 10)  Magic Mouthwash  .... 1 tsp swish and swallow 11)  Claritin-d 24 Hour 10-240 Mg Xr24h-tab (Loratadine-pseudoephedrine) .... Once daily as needed  Patient Instructions: 1)  Reassured her that the hairy tongue is benign and self-limited. She could brush the tongue with her toothbrush several times a day. Try Claritin D for her allergies. Draw labs as below.  Prescriptions: MAGIC MOUTHWASH 1 tsp swish and swallow  #6oz x 0   Entered by:   Duard Brady LPN   Authorized by:   Nelwyn Salisbury MD   Signed by:   Nelwyn Salisbury MD on 03/14/2010   Method used:   Print then Give to Patient   RxID:   [id number]

## 2011-01-10 NOTE — Letter (Signed)
Summary: EGD Instructions   Gastroenterology  [address] [place], [place] [address]   Phone: [phone]  Fax: [phone]       Lori [name]    22-Feb-1966    MRN: [id number]       Procedure Day [name]:  [name]  07/26/10     Arrival Time: 7:30AM     Procedure Time:  8:30AM     Location of Procedure:                     _ X _ [facility] ( Outpatient Registration)    PREPARATION FOR ENDOSCOPY   On  07/26/10  TUESDAY THE DAY OF THE PROCEDURE:  1.   No solid foods, milk or milk products are allowed after midnight the night before your procedure.  2.   Do not drink anything colored red or purple.  Avoid juices with pulp.  No orange juice.  3.  You may drink clear liquids until 4:30AM, which is 4 hours before your procedure.                                                                                                CLEAR LIQUIDS INCLUDE: Water Jello Ice Popsicles Tea (sugar ok, no milk/cream) Powdered fruit flavored drinks Coffee (sugar ok, no milk/cream) Gatorade Juice: apple, white grape, white cranberry  Lemonade Clear bullion, consomm, broth Carbonated beverages (any kind) Strained chicken noodle soup Hard Candy   MEDICATION INSTRUCTIONS  Unless otherwise instructed, you should take regular prescription medications with a small sip of water as early as possible the morning of your procedure.    Additional medication instructions: Hold Furosemide the morning of precedure.             OTHER INSTRUCTIONS  You will need a responsible adult at least 46 years of age to accompany you and drive you home.   This person must remain in the waiting room during your procedure.  Wear loose fitting clothing that is easily removed.  Leave jewelry and other valuables at home.  However, you may wish to bring a book to read or an iPod/MP3 player to listen to music as you wait for your procedure to start.  Remove all body piercing jewelry and  leave at home.  Total time from sign-in until discharge is approximately 2-3 hours.  You should go home directly after your procedure and rest.  You can resume normal activities the day after your procedure.  The day of your procedure you should not:   Drive   Make legal decisions   Operate machinery   Drink alcohol   Return to work  You will receive specific instructions about eating, activities and medications before you leave.    The above instructions have been reviewed and explained to me by  Wyona Almas RN  July 21, 2010 10:48 AM     I fully understand and can verbalize these instructions _____________________________ Date _________      Appended Document: EGD Instructions Message left on pts. cell phone [phone] to hold PPI X3 days prior  to procedure and may take antacids up to 12 hours befor and then hold.

## 2011-01-10 NOTE — Assessment & Plan Note (Signed)
Summary: cpx/no pap/njr   Vital Signs:  [name] profile:   45 year old female Height:      65 inches Weight:      188 pounds BMI:     31.40 Temp:     98.2 degrees F oral BP sitting:   110 / 70  (left arm) Cuff size:   regular  Vitals Entered By: Duard Brady LPN (July 29, 2010 1:12 PM) CC: cpx - good Is [name] Diabetic? No   Primary Care Provider:  Gordy Savers  MD  CC:  cpx - good.  History of Present Illness: Lori [name] who is seen today for a wellness exam.  She has a history of cervical disk disease and is status post recent fusion in April of this year.  She has severe reflux symptoms and is considering a  Niesen fundoplication.  She has had  a recent gynecologic exam.  Preventive Screening-Counseling & Management  Alcohol-Tobacco     Smoking Cessation Counseling: yes  Allergies: 1)  ! * Torecan 2)  ! Morphine  Past History:  Past Medical History: GERD since 1988  Osteoarthritis tobacco use Cervical fusion 07-2009 hiatal hernia Back Pain   Past Surgical History: Appendectomy 2009 Lumpectomy 2001 Lumbar laminectomy 2000 Cervical fusion 2007, 07-2009, 04-04-2010 gravida two, para one, abortus one, status post C-section 1988  EGD 8-11 with manometry  colonoscopy x 2 ( November 2007)- [facility]  Family History: Reviewed history from 05/27/2010 and no changes required.  father died age 81, complications of pneumonia ;history of EtOH mothered died age 44, history of coronary artery disease, breast cancer, hypertension, impaired glucose tolerance A number of half-brothers and half-sisters from both her mother's and father's side of the family:  positive for colonic polyps, hypertension, alcoholism; coronary artery disease, peripheral vascular occlusive disease Family History of Colon Polyps: 1/2 brothers and sister  Social History: Reviewed history from 05/27/2010 and no changes required. Married Current Smoker stepdaughter and  stepson Alcohol Use - no Daily Caffeine Use 4-6 per day Illicit Drug Use - no  Review of Systems  The [name] denies anorexia, fever, weight loss, weight gain, vision loss, decreased hearing, hoarseness, chest pain, syncope, dyspnea on exertion, peripheral edema, prolonged cough, headaches, hemoptysis, abdominal pain, melena, hematochezia, severe indigestion/heartburn, hematuria, incontinence, genital sores, muscle weakness, suspicious skin lesions, transient blindness, difficulty walking, depression, unusual weight change, abnormal bleeding, enlarged lymph nodes, angioedema, and breast masses.    Physical Exam  General:  overweight-appearing.  110/74overweight-appearing.   Head:  Normocephalic and atraumatic without obvious abnormalities. No apparent alopecia or balding. Eyes:  No corneal or conjunctival inflammation noted. EOMI. Perrla. Funduscopic exam benign, without hemorrhages, exudates or papilledema. Vision grossly normal. Ears:  External ear exam shows no significant lesions or deformities.  Otoscopic examination reveals clear canals, tympanic membranes are intact bilaterally without bulging, retraction, inflammation or discharge. Hearing is grossly normal bilaterally. Nose:  External nasal examination shows no deformity or inflammation. Nasal mucosa are pink and moist without lesions or exudates. Mouth:  Oral mucosa and oropharynx without lesions or exudates.  Teeth in good repair. Neck:  surgical scar, left anterior neck Chest Wall:  No deformities, masses, or tenderness noted. Lungs:  Normal respiratory effort, chest expands symmetrically. Lungs are clear to auscultation, no crackles or wheezes. Heart:  Normal rate and regular rhythm. S1 and S2 normal without gallop, murmur, click, rub or other extra sounds. Abdomen:  Bowel sounds positive,abdomen soft and non-tender without masses, organomegaly or hernias noted. Msk:  No  deformity or scoliosis noted of thoracic or lumbar spine.    Pulses:  R and L carotid,radial,femoral,dorsalis pedis and posterior tibial pulses are full and equal bilaterally Extremities:  No clubbing, cyanosis, edema, or deformity noted with normal full range of motion of all joints.   Neurologic:  No cranial nerve deficits noted. Station and gait are normal. Plantar reflexes are down-going bilaterally. DTRs are symmetrical throughout. Sensory, motor and coordinative functions appear intact. Skin:  Intact without suspicious lesions or rashes Cervical Nodes:  No lymphadenopathy noted Axillary Nodes:  No palpable lymphadenopathy Inguinal Nodes:  No significant adenopathy Psych:  Cognition and judgment appear intact. Alert and cooperative with normal attention span and concentration. No apparent delusions, illusions, hallucinations   Impression & Recommendations:  Problem # 1:  Preventive Health Care (ICD-V70.0)  Complete Medication List: 1)  Zegerid Otc 20-1100 Mg Caps (Omeprazole-sodium bicarbonate) .... Take two tabs by mouth two times a day 2)  Diazepam 5 Mg Tabs (Diazepam) .... One at bedtime p.r.n. sleep 3)  Furosemide 20 Mg Tabs (Furosemide) .... One by mouth daily for swelling 4)  Percocet 10-325 Mg Tabs (Oxycodone-acetaminophen) .... Prn 5)  Vitamin D (ergocalciferol) 50000 Unit Caps (Ergocalciferol) .Marland Kitchen.. 1 by mouth qweek for 6 mos  [name] Instructions: 1)  Avoid foods high in acid (tomatoes, citrus juices, spicy foods). Avoid eating within two hours of lying down or before exercising. Do not over eat; try smaller more frequent meals. Elevate head of bed twelve inches when sleeping. 2)  Tobacco is very bad for your health and your loved ones! You Should stop smoking!. 3)  It is important that you exercise regularly at least 20 minutes 5 times a week. If you develop chest pain, have severe difficulty breathing, or feel very tired , stop exercising immediately and seek medical attention. 4)  You need to lose weight. Consider a lower calorie  diet and regular exercise.  Prescriptions: FUROSEMIDE 20 MG TABS (FUROSEMIDE) one by mouth daily for swelling  #90 Tablet x 6   Entered and Authorized by:   Gordy Savers  MD   Signed by:   Gordy Savers  MD on 07/29/2010   Method used:   Print then Give to [name]   RxID:   [id number] DIAZEPAM 5 MG TABS (DIAZEPAM) one at bedtime p.r.n. sleep  #30 x 2   Entered and Authorized by:   Gordy Savers  MD   Signed by:   Gordy Savers  MD on 07/29/2010   Method used:   Print then Give to [name]   RxID:   [id number]

## 2011-01-10 NOTE — Assessment & Plan Note (Signed)
Summary: NEW TO BE EST/MHF   Vital Signs:  Patient Profile:   45 Years Old Female Height:     64.75 inches Weight:      181 pounds Temp:     99.1 degrees F Pulse rate:   80 / minute Pulse rhythm:   regular BP sitting:   120 / 90  (left arm) Cuff size:   regular  Vitals Entered By: [name], RN (December 01, 2008 2:19 PM)                 Chief Complaint:  New, wants check-up. C/o GERD, sore glands, and cramps L forearm.Lori [name]  History of Present Illness: 45 year old female seen today for an exam and to establish with our practice.  Medical problems include gastroesophageal reflux disease.  She has DJD affecting primarily the cervical and lumbar spine complaints today include minor URI symptoms.  She is followed by gynecology and has had no recent laboratory studies    Current Allergies: ! Natale Milch  Past Medical History:    Reviewed history and no changes required:       GERD       Osteoarthritis       tobacco use  Past Surgical History:    Reviewed history and no changes required:       Appendectomy 2009       Lumpectomy 2001       Lumbar laminectomy 2000       Cervical fusion 2007       gravida two, para one, abortus one, status post C-section 1988              colonoscopy x 2 ( November 2007)   Family History:    Reviewed history and no changes required:        father died age 32, complications of pneumonia ;history of EtOH       mothered died age 47, history of coronary artery disease, breast cancer, hypertension, impaired glucose tolerance              A number of half-brothers and half-sisters from both her mother's and father's side of the family:  positive for colonic polyps, hypertension, alcoholism; coronary artery disease, peripheral vascular occlusive disease  Social History:    Reviewed history and no changes required:       Married       Current Smoker       68 year old daughter       stepson   Risk Factors:  Tobacco use:  current    Review of Systems       The patient complains of severe indigestion/heartburn.  The patient denies anorexia, fever, weight loss, weight gain, vision loss, decreased hearing, hoarseness, chest pain, syncope, dyspnea on exertion, peripheral edema, prolonged cough, headaches, hemoptysis, abdominal pain, melena, hematochezia, hematuria, incontinence, genital sores, muscle weakness, suspicious skin lesions, transient blindness, difficulty walking, depression, unusual weight change, abnormal bleeding, enlarged lymph nodes, angioedema, and breast masses.     Physical Exam  General:     overweight-appearing.  140/ 90 Head:     Normocephalic and atraumatic without obvious abnormalities. No apparent alopecia or balding. Eyes:     No corneal or conjunctival inflammation noted. EOMI. Perrla. Funduscopic exam benign, without hemorrhages, exudates or papilledema. Vision grossly normal. Ears:     External ear exam shows no significant lesions or deformities.  Otoscopic examination reveals clear canals, tympanic membranes are intact bilaterally without bulging, retraction, inflammation or discharge. Hearing is  grossly normal bilaterally. Nose:     External nasal examination shows no deformity or inflammation. Nasal mucosa are pink and moist without lesions or exudates. Mouth:     pharyngeal erythema.   Neck:     No deformities, masses, or tenderness noted. Chest Wall:     No deformities, masses, or tenderness noted. Breasts:     No mass, nodules, thickening, tenderness, bulging, retraction, inflamation, nipple discharge or skin changes noted.   Lungs:     Normal respiratory effort, chest expands symmetrically. Lungs are clear to auscultation, no crackles or wheezes. Heart:     Normal rate and regular rhythm. S1 and S2 normal without gallop, murmur, click, rub or other extra sounds. Abdomen:     Bowel sounds positive,abdomen soft and non-tender without masses, organomegaly or hernias noted. Msk:      No deformity or scoliosis noted of thoracic or lumbar spine.   Pulses:     R and L carotid,radial,femoral,dorsalis pedis and posterior tibial pulses are full and equal bilaterally Extremities:     No clubbing, cyanosis, edema, or deformity noted with normal full range of motion of all joints.   Neurologic:     No cranial nerve deficits noted. Station and gait are normal. Plantar reflexes are down-going bilaterally. . Sensory, motor and coordinative functions appear intact.  the right Achilles reflex diminished compared to the left Skin:     Intact without suspicious lesions or rashes Cervical Nodes:     No lymphadenopathy noted Axillary Nodes:     No palpable lymphadenopathy Inguinal Nodes:     No significant adenopathy Psych:     Cognition and judgment appear intact. Alert and cooperative with normal attention span and concentration. No apparent delusions, illusions, hallucinations    Impression & Recommendations:  Problem # 1:  GERD (ICD-530.81)  Her updated medication list for this problem includes:    Zegerid 40-1100 Mg Caps (Omeprazole-sodium bicarbonate) .Lori [name]... 1 two times a day  Orders: Venipuncture (19147) TLB-BMP (Basic Metabolic Panel-BMET) (80048-METABOL) TLB-CBC Platelet - w/Differential (85025-CBCD) TLB-Hepatic/Liver Function Pnl (80076-HEPATIC) TLB-TSH (Thyroid Stimulating Hormone) (84443-TSH) TLB-Cholesterol, Total (82465-CHO)   Problem # 2:  OSTEOARTHRITIS (ICD-715.90)  Orders: Venipuncture (82956) TLB-BMP (Basic Metabolic Panel-BMET) (80048-METABOL) TLB-CBC Platelet - w/Differential (85025-CBCD) TLB-Hepatic/Liver Function Pnl (80076-HEPATIC) TLB-TSH (Thyroid Stimulating Hormone) (84443-TSH) TLB-Cholesterol, Total (82465-CHO)   Problem # 3:  URI (ICD-465.9)  Orders: Venipuncture (21308) TLB-BMP (Basic Metabolic Panel-BMET) (80048-METABOL) TLB-CBC Platelet - w/Differential (85025-CBCD) TLB-Hepatic/Liver Function Pnl (80076-HEPATIC) TLB-TSH  (Thyroid Stimulating Hormone) (84443-TSH) TLB-Cholesterol, Total (82465-CHO)   Problem # 4:  TOBACCO USE (ICD-305.1)  Orders: Venipuncture (65784) TLB-BMP (Basic Metabolic Panel-BMET) (80048-METABOL) TLB-CBC Platelet - w/Differential (85025-CBCD) TLB-Hepatic/Liver Function Pnl (80076-HEPATIC) TLB-TSH (Thyroid Stimulating Hormone) (84443-TSH) TLB-Cholesterol, Total (82465-CHO)   Complete Medication List: 1)  Zegerid 40-1100 Mg Caps (Omeprazole-sodium bicarbonate) .Lori [name].. 1 two times a day 2)  Diazepam 5 Mg Tabs (Diazepam) .... One at bedtime p.r.n. sleep   Patient Instructions: 1)  Get plenty of rest, drink lots of clear liquids, and use Tylenol or Ibuprofen for fever and comfort. Return in 7-10 days if you're not better:sooner if you're feeling worse. 2)  Limit your Sodium (Salt). 3)  It is important that you exercise regularly at least 20 minutes 5 times a week. If you develop chest pain, have severe difficulty breathing, or feel very tired , stop exercising immediately and seek medical attention. 4)  You need to lose weight. Consider a lower calorie diet and regular exercise.  5)  Check your Blood Pressure  regularly. If it is above:  140/90 you should make an appointment.   Prescriptions: DIAZEPAM 5 MG TABS (DIAZEPAM) one at bedtime p.r.n. sleep  #30 x 3   Entered and Authorized by:   Gordy Savers  MD   Signed by:   Gordy Savers  MD on 12/01/2008   Method used:   Print then Give to Patient   RxID:   [id number] ZEGERID 40-1100 MG CAPS (OMEPRAZOLE-SODIUM BICARBONATE) 1 two times a day  #180 x 6   Entered and Authorized by:   Gordy Savers  MD   Signed by:   Gordy Savers  MD on 12/01/2008   Method used:   Print then Give to Patient   RxID:   [id number]  ]

## 2011-01-10 NOTE — Miscellaneous (Signed)
  Clinical Lists Changes  Medications: Added new medication of NASONEX 50 MCG/ACT SUSP (MOMETASONE FUROATE) 2 sprays ea nostril two times a day - Signed Rx of NASONEX 50 MCG/ACT SUSP (MOMETASONE FUROATE) 2 sprays ea nostril two times a day;  #17 x 2;  Signed;  Entered by: [name], RN;  Authorized by: Gordy Savers  MD;  Method used: [facility] to [facility] [phone]*, [address] [place], [place], [place]  [address], Ph: [phone] or [phone], Fax: [phone]    Prescriptions: NASONEX 50 MCG/ACT SUSP (MOMETASONE FUROATE) 2 sprays ea nostril two times a day  #17 x 2   Entered by:   [name], RN   Authorized by:   Gordy Savers  MD   Signed by:   [name], RN on 04/19/2009   Method used:   Electronically to        [facility] [phone]* (retail)       [address]       [place], [place]  [address]       Ph: [phone] or [phone]       Fax: [phone]   RxID:   [id number]

## 2011-01-10 NOTE — Progress Notes (Signed)
Summary: lab results  Phone Note Call from Patient Call back at [phone]   Caller: live Call For: kwia Reason for Call: Lab or Test Results Summary of Call: Lab is not signed off. Initial call taken by: Rudy Jew, RN,  April 07, 2009 4:04 PM  Follow-up for Phone Call        noted Follow-up by: Gordy Savers  MD,  April 08, 2009 7:39 AM

## 2011-01-10 NOTE — Procedures (Signed)
Summary: Esophageal Manometry  Esophageal Manometry   Imported By: Lester Clermont 08/23/2010 10:04:37  _____________________________________________________________________  External Attachment:    Type:   Image     Comment:   External Document

## 2011-01-10 NOTE — Progress Notes (Signed)
Summary: Triage-PH/Manometry Rescheduled  Phone Note Call from Patient Call back at Home Phone [phone] Call back at [phone]   Caller: Patient Call For: Dr. Arlyce Dice Reason for Call: Talk to Nurse Summary of Call: Wasn't able to do her Manometry yesterday and wants to know what she needs to do Initial call taken by: Karna Christmas,  July 13, 2010 11:16 AM  Follow-up for Phone Call        Pt. was for a Mano/24 hour PH, per request of Dr.Matt Daphine Deutscher.  Stephanie at [facility]. states pt. did not do well, she panicked, they couldn't get past her nose, she was crying and screaming. Judeth Cornfield doesn't  think even Valium would help this patient.  They attempted twice, in both nasal passages, no success.   [facility] PLEASE ADVISE  Follow-up by: Laureen Ochs LPN,  July 13, 2010 11:36 AM  Additional Follow-up for Phone Call Additional follow up Details #1::        schedule her for egd with pH probe placement.  I will place manometry tube while she's asleep Additional Follow-up by: Louis Meckel MD,  July 13, 2010 12:00 PM    Additional Follow-up for Phone Call Additional follow up Details #2::    Judeth Cornfield RN at [facility] Endo. will need to discuss this w/her manager and get back to me about scheduling.  The pt. is aware I will call her when procedures are scheduled.  Laureen Ochs LPN  July 13, 2010 3:23 PM  Per Arlys John at [facility]., Pt. cannot have Manometry done when sedated.  [facility] PLEASE ADVISE Laureen Ochs LPN  July 15, 2010 1:30 PM    Additional Follow-up for Phone Call Additional follow up Details #3:: Details for Additional Follow-up Action Taken: I will talk with Trey Paula when she returns on Monday  OK to schedule EGD/ph probe/manometry for 1 sitting.   Additional Follow-up by: Louis Meckel MD,  July 15, 2010 2:03 PM  Pt. is scheduled for a previsit on 07-21-10 at 10:30am. She is scheduled for an Endoscopy with Bravo PH probe placement and she will  have a Manometry probe placed while sedated, at [facility] on 07-26-10 at 8:30am. Pt. instructed to call back as needed.

## 2011-01-10 NOTE — Letter (Signed)
Summary: Results Letter   Gastroenterology  [address] [place], [place] [address]   Phone: [phone]  Fax: [phone]        May 27, 2010 MRN: [id number]    Lori [name] [address] [place], [place]  [address]    Dear Ms. GOINS,  It is my pleasure to have treated you recently as a new patient in my office. I appreciate your confidence and the opportunity to participate in your care.  Since I do have a busy inpatient endoscopy schedule and office schedule, my office hours vary weekly. I am, however, available for emergency calls everyday through my office. If I am not available for an urgent office appointment, another one of our gastroenterologist will be able to assist you.  My well-trained staff are prepared to help you at all times. For emergencies after office hours, a physician from our Gastroenterology section is always available through my 24 hour answering service  Once again I welcome you as a new patient and I look forward to a happy and healthy relationship             Sincerely,  Louis Meckel MD  This letter has been electronically signed by your physician.  Appended Document: Results Letter LETTER MAILED

## 2011-01-10 NOTE — Assessment & Plan Note (Signed)
Summary: ?bronchitus/cjr   Vital Signs:  Patient profile:   45 year old female Weight:      191 pounds Temp:     98.2 degrees F oral BP sitting:   120 / 80  (right arm) Cuff size:   regular  Vitals Entered By: Duard Brady LPN (September 22, 2010 3:34 PM) CC: c/o chest congestion , cough - productive  - having surgery next week Is Patient Diabetic? No   Primary Care Provider:  Gordy Savers  MD  CC:  c/o chest congestion  and cough - productive  - having surgery next week.  History of Present Illness: 45 year old patient who is seen today for follow-up.  She is scheduled for a surgery next week due to severe reflux refractory to medical therapy.  She has seen gynecology recently and is on Ceftin for a breast soft tissue infection.  She's also developed congestion and cough.  She continues to smoke.  She is fearful that her URI.  Will postpone her surgery next week.  She also is asking for a refill of Percocet.  She was told that I do not prescribe this medication, but would do well in to give her a limited supply of hydrocodone  Preventive Screening-Counseling & Management  Alcohol-Tobacco     Smoking Cessation Counseling: yes  Allergies: 1)  ! * Torecan 2)  ! Morphine  Past History:  Past Medical History: Reviewed history from 07/29/2010 and no changes required. GERD since 1988  Osteoarthritis tobacco use Cervical fusion 07-2009 hiatal hernia Back Pain   Past Surgical History: Reviewed history from 07/29/2010 and no changes required. Appendectomy 2009 Lumpectomy 2001 Lumbar laminectomy 2000 Cervical fusion 2007, 07-2009, 04-04-2010 gravida two, para one, abortus one, status post C-section 1988  EGD 8-11 with manometry  colonoscopy x 2 ( November 2007)- [facility]  Review of Systems       The patient complains of prolonged cough.  The patient denies anorexia, fever, weight loss, weight gain, vision loss, decreased hearing, hoarseness, chest pain, syncope,  dyspnea on exertion, peripheral edema, headaches, hemoptysis, abdominal pain, melena, hematochezia, severe indigestion/heartburn, hematuria, incontinence, genital sores, muscle weakness, suspicious skin lesions, transient blindness, difficulty walking, depression, unusual weight change, abnormal bleeding, enlarged lymph nodes, angioedema, and breast masses.    Physical Exam  General:  overweight-appearing.  afebrile frequent paroxysms of coughingoverweight-appearing.   Head:  Normocephalic and atraumatic without obvious abnormalities. No apparent alopecia or balding. Eyes:  No corneal or conjunctival inflammation noted. EOMI. Perrla. Funduscopic exam benign, without hemorrhages, exudates or papilledema. Vision grossly normal. Ears:  External ear exam shows no significant lesions or deformities.  Otoscopic examination reveals clear canals, tympanic membranes are intact bilaterally without bulging, retraction, inflammation or discharge. Hearing is grossly normal bilaterally. Nose:  rhinorrhea Mouth:  Oral mucosa and oropharynx without lesions or exudates.  Teeth in good repair. Neck:  No deformities, masses, or tenderness noted. Lungs:  Normal respiratory effort, chest expands symmetrically. Lungs are clear to auscultation, no crackles or wheezes. O2 saturation 97% Heart:  Normal rate and regular rhythm. S1 and S2 normal without gallop, murmur, click, rub or other extra sounds. Abdomen:  Bowel sounds positive,abdomen soft and non-tender without masses, organomegaly or hernias noted. Msk:  No deformity or scoliosis noted of thoracic or lumbar spine.   Pulses:  R and L carotid,radial,femoral,dorsalis pedis and posterior tibial pulses are full and equal bilaterally   Impression & Recommendations:  Problem # 1:  BACK PAIN (ICD-724.5)  The following medications were  removed from the medication list:    Percocet 10-325 Mg Tabs (Oxycodone-acetaminophen) .Marland Kitchen... Prn Her updated medication list for this  problem includes:    Hydrocodone-acetaminophen 7.5-750 Mg Tabs (Hydrocodone-acetaminophen) ..... One every 6 hours as needed for pain  The following medications were removed from the medication list:    Percocet 10-325 Mg Tabs (Oxycodone-acetaminophen) .Marland Kitchen... Prn Her updated medication list for this problem includes:    Hydrocodone-acetaminophen 7.5-750 Mg Tabs (Hydrocodone-acetaminophen) ..... One every 6 hours as needed for pain  Problem # 2:  URI (ICD-465.9)  Problem # 3:  GERD (ICD-530.81)  Her updated medication list for this problem includes:    Zegerid Otc 20-1100 Mg Caps (Omeprazole-sodium bicarbonate) .Marland Kitchen... Take two tabs by mouth two times a day  Her updated medication list for this problem includes:    Zegerid Otc 20-1100 Mg Caps (Omeprazole-sodium bicarbonate) .Marland Kitchen... Take two tabs by mouth two times a day  Complete Medication List: 1)  Zegerid Otc 20-1100 Mg Caps (Omeprazole-sodium bicarbonate) .... Take two tabs by mouth two times a day 2)  Diazepam 5 Mg Tabs (Diazepam) .... One at bedtime p.r.n. sleep 3)  Furosemide 20 Mg Tabs (Furosemide) .... One by mouth daily for swelling 4)  Vitamin D (ergocalciferol) 50000 Unit Caps (Ergocalciferol) .Marland Kitchen.. 1 by mouth qweek for 6 mos 5)  Ceftin 250 Mg Tabs (Cefuroxime axetil) .... Two times a day x10days 6)  Diflucan 150mg   .... 1 by mouth once daily for 3 days 7)  Hydrocodone-acetaminophen 7.5-750 Mg Tabs (Hydrocodone-acetaminophen) .... One every 6 hours as needed for pain  Patient Instructions: 1)  Get plenty of rest, drink lots of clear liquids, and use Tylenol or Ibuprofen for fever and comfort. Return in 7-10 days if you're not better:sooner if you're feeling worse. 2)  Take your antibiotic as prescribed until ALL of it is gone, but stop if you develop a rash or swelling and contact our office as soon as possible. 3)  Tobacco is very bad for your health and your loved ones! You Should stop  smoking!. Prescriptions: HYDROCODONE-ACETAMINOPHEN 7.5-750 MG TABS (HYDROCODONE-ACETAMINOPHEN) one every 6 hours as needed for pain  #50 x 0   Entered and Authorized by:   Gordy Savers  MD   Signed by:   Gordy Savers  MD on 09/22/2010   Method used:   Print then Give to Patient   RxID:   [id number]

## 2011-01-10 NOTE — Letter (Signed)
Summary: Attending Physician's Statement  Attending Physician's Statement   Imported By: Maryln Gottron 11/03/2009 14:58:09  _____________________________________________________________________  External Attachment:    Type:   Image     Comment:   External Document

## 2011-01-10 NOTE — Assessment & Plan Note (Signed)
Summary: flu like symptoms/cough/body aches/diarrhea/cjr   Vital Signs:  Patient profile:   45 year old female Weight:      196 pounds BMI:     32.99 Temp:     98.5 degrees F oral BP sitting:   110 / 84  (left arm) Cuff size:   regular  Vitals Entered By: [name], RN (October 18, 2009 11:14 AM) CC: Sick since Thursday with achy, diarrhea, vomiting, back and headache.   CC:  Sick since Thursday with achy, diarrhea, vomiting, and back and headache..  History of Present Illness: 45 year old female, who is stable until late last week, when she developed nausea, vomiting, and diarrhea.  This resolved after two or 3 days, but she has persistent weakness, headache, cough, and some chills.  She was also concerned about a UTI due to some strong odor to the urine.  She has a history of reflux disease and does take proton inhibitor chronically.  Denies any fever or productive cough.  No chest pain or shortness of breath.  Preventive Screening-Counseling & Management  Alcohol-Tobacco     Smoking Status: current     Smoking Cessation Counseling: yes  Allergies: 1)  ! * Torecan  Past History:  Past Medical History: Reviewed history from 12/01/2008 and no changes required. GERD Osteoarthritis tobacco use  Review of Systems       The patient complains of prolonged cough, headaches, and abdominal pain.  The patient denies anorexia, fever, weight loss, weight gain, vision loss, decreased hearing, hoarseness, chest pain, syncope, dyspnea on exertion, peripheral edema, hemoptysis, melena, hematochezia, severe indigestion/heartburn, hematuria, incontinence, genital sores, muscle weakness, suspicious skin lesions, transient blindness, difficulty walking, depression, unusual weight change, abnormal bleeding, enlarged lymph nodes, angioedema, and breast masses.    Physical Exam  General:  overweight-appearing.  no distress.  Normal blood pressure. Head:  Normocephalic and atraumatic  without obvious abnormalities. No apparent alopecia or balding. Eyes:  No corneal or conjunctival inflammation noted. EOMI. Perrla. Funduscopic exam benign, without hemorrhages, exudates or papilledema. Vision grossly normal. Ears:  External ear exam shows no significant lesions or deformities.  Otoscopic examination reveals clear canals, tympanic membranes are intact bilaterally without bulging, retraction, inflammation or discharge. Hearing is grossly normal bilaterally. Nose:  External nasal examination shows no deformity or inflammation. Nasal mucosa are pink and moist without lesions or exudates. Mouth:  Oral mucosa and oropharynx without lesions or exudates.  Teeth in good repair. Neck:  No deformities, masses, or tenderness noted. Lungs:  Normal respiratory effort, chest expands symmetrically. Lungs are clear to auscultation, no crackles or wheezes. Heart:  Normal rate and regular rhythm. S1 and S2 normal without gallop, murmur, click, rub or other extra sounds. Abdomen:  Bowel sounds positive,abdomen soft and non-tender without masses, organomegaly or hernias noted.   Impression & Recommendations:  Problem # 1:  URI (ICD-465.9)  Her updated medication list for this problem includes:    Mucinex Dm Maximum Strength 60-1200 Mg Xr12h-tab (Dextromethorphan-guaifenesin) ..... One by mouth bid    Hydrocodone-homatropine 5-1.5 Mg/3ml Syrp (Hydrocodone-homatropine) .Marland Kitchen... 1 teaspoon every 6 hours as needed for cough or congestion  Problem # 2:  GERD (ICD-530.81)  Her updated medication list for this problem includes:    Zegerid 40-1100 Mg Caps (Omeprazole-sodium bicarbonate) .Marland Kitchen... 1 two times a day  Complete Medication List: 1)  Zegerid 40-1100 Mg Caps (Omeprazole-sodium bicarbonate) .Marland Kitchen.. 1 two times a day 2)  Diazepam 5 Mg Tabs (Diazepam) .... One at bedtime p.r.n. sleep 3)  Chantix Starting Month Pak 0.5 Mg X 11 & 1 Mg X 42 Misc (Varenicline tartrate) .... Uad 4)  Nasonex 50 Mcg/act Susp  (Mometasone furoate) .... 2 sprays ea nostril two times a day 5)  Mucinex Dm Maximum Strength 60-1200 Mg Xr12h-tab (Dextromethorphan-guaifenesin) .... One by mouth bid 6)  Furosemide 20 Mg Tabs (Furosemide) .... One by mouth daily for swelling 7)  Hydrocodone-homatropine 5-1.5 Mg/63ml Syrp (Hydrocodone-homatropine) .Marland Kitchen.. 1 teaspoon every 6 hours as needed for cough or congestion  Other Orders: UA Dipstick w/o Micro (manual) ([id number])  Patient Instructions: 1)  Tobacco is very bad for your health and your loved ones! You Should stop smoking!. 2)  Get plenty of rest, drink lots of clear liquids, and use Tylenol or Ibuprofen for fever and comfort. Return in 7-10 days if you're not better:sooner if you're feeling worse. 3)  Drink as much fluid as you can tolerate for the next few days. Prescriptions: HYDROCODONE-HOMATROPINE 5-1.5 MG/5ML SYRP (HYDROCODONE-HOMATROPINE) 1 teaspoon every 6 hours as needed for cough or congestion  #6 oz x 0   Entered and Authorized by:   Gordy Savers  MD   Signed by:   Gordy Savers  MD on 10/18/2009   Method used:   Print then Give to Patient   RxID:   [id number] NASONEX 50 MCG/ACT SUSP (MOMETASONE FUROATE) 2 sprays ea nostril two times a day  #17 x 2   Entered and Authorized by:   Gordy Savers  MD   Signed by:   Gordy Savers  MD on 10/18/2009   Method used:   Print then Give to Patient   RxID:   [id number]   Laboratory Results   Urine Tests    Routine Urinalysis   Color: lt. yellow Appearance: Clear Glucose: negative   (Normal Range: Negative) Bilirubin: negative   (Normal Range: Negative) Ketone: negative   (Normal Range: Negative) Spec. Gravity: <1.005   (Normal Range: 1.003-1.035) Blood: negative   (Normal Range: Negative) pH: 6.0   (Normal Range: 5.0-8.0) Protein: negative   (Normal Range: Negative) Urobilinogen: 0.2   (Normal Range: 0-1) Nitrite: negative   (Normal Range: Negative) Leukocyte Esterace:  negative   (Normal Range: Negative)

## 2011-01-12 NOTE — Letter (Addendum)
Summary: [facility]   Imported By: Maryln Gottron 01/05/2011 11:01:16  _____________________________________________________________________  External Attachment:    Type:   Image     Comment:   External Document

## 2011-02-20 LAB — CBC
HCT: 36.5 % (ref 36.0–46.0)
Hemoglobin: 12 g/dL (ref 12.0–15.0)
MCH: 31.5 pg (ref 26.0–34.0)
MCHC: 32.9 g/dL (ref 30.0–36.0)
MCV: 95.8 fL (ref 78.0–100.0)
Platelets: 179 10*3/uL (ref 150–400)
RBC: 3.81 MIL/uL — ABNORMAL LOW (ref 3.87–5.11)
RDW: 13.5 % (ref 11.5–15.5)
WBC: 11.9 10*3/uL — ABNORMAL HIGH (ref 4.0–10.5)

## 2011-02-21 LAB — MRSA CULTURE

## 2011-02-21 LAB — BASIC METABOLIC PANEL
BUN: 6 mg/dL (ref 6–23)
CO2: 28 mEq/L (ref 19–32)
Calcium: 9.4 mg/dL (ref 8.4–10.5)
Chloride: 104 mEq/L (ref 96–112)
Creatinine, Ser: 0.74 mg/dL (ref 0.4–1.2)
GFR calc Af Amer: >60 mL/min (ref >60)
GFR calc non Af Amer: >60 mL/min (ref >60)
Glucose, Bld: 106 mg/dL — ABNORMAL HIGH (ref 70–99)
Potassium: 3.5 mEq/L (ref 3.5–5.1)
Sodium: 140 mEq/L (ref 135–145)

## 2011-02-21 LAB — SURGICAL PCR SCREEN: Staphylococcus aureus: INVALID — AB

## 2011-02-21 LAB — PREGNANCY, URINE: Preg Test, Ur: NEGATIVE

## 2011-02-22 LAB — BASIC METABOLIC PANEL
BUN: 6 mg/dL (ref 6–23)
CO2: 28 mEq/L (ref 19–32)
Calcium: 9.5 mg/dL (ref 8.4–10.5)
Chloride: 105 mEq/L (ref 96–112)
Creatinine, Ser: 0.64 mg/dL (ref 0.4–1.2)
GFR calc Af Amer: >60 mL/min (ref >60)
GFR calc non Af Amer: >60 mL/min (ref >60)
Glucose, Bld: 105 mg/dL — ABNORMAL HIGH (ref 70–99)
Potassium: 3.3 mEq/L — ABNORMAL LOW (ref 3.5–5.1)
Sodium: 140 mEq/L (ref 135–145)

## 2011-02-22 LAB — CBC
HCT: 41.1 % (ref 36.0–46.0)
Hemoglobin: 14.2 g/dL (ref 12.0–15.0)
MCH: 33 pg (ref 26.0–34.0)
MCHC: 34.5 g/dL (ref 30.0–36.0)
MCV: 95.5 fL (ref 78.0–100.0)
Platelets: 206 10*3/uL (ref 150–400)
RBC: 4.3 MIL/uL (ref 3.87–5.11)
RDW: 14.3 % (ref 11.5–15.5)
WBC: 11.6 10*3/uL — ABNORMAL HIGH (ref 4.0–10.5)

## 2011-02-22 LAB — PREGNANCY, URINE: Preg Test, Ur: NEGATIVE

## 2011-02-22 LAB — SURGICAL PCR SCREEN
MRSA, PCR: NEGATIVE
Staphylococcus aureus: NEGATIVE

## 2011-02-28 LAB — CBC
HCT: 42.7 % (ref 36.0–46.0)
Hemoglobin: 14.8 g/dL (ref 12.0–15.0)
MCHC: 34.8 g/dL (ref 30.0–36.0)
MCV: 95.1 fL (ref 78.0–100.0)
Platelets: 230 10*3/uL (ref 150–400)
RBC: 4.48 MIL/uL (ref 3.87–5.11)
RDW: 13.9 % (ref 11.5–15.5)
WBC: 12.1 10*3/uL — ABNORMAL HIGH (ref 4.0–10.5)

## 2011-02-28 LAB — SURGICAL PCR SCREEN
MRSA, PCR: NEGATIVE
Staphylococcus aureus: NEGATIVE

## 2011-04-25 NOTE — Op Note (Signed)
Lori [name], Lori [name]                 ACCOUNT NO.:  [id number]   MEDICAL RECORD NO.:  [id number]          PATIENT TYPE:  INP   LOCATION:  0106                         FACILITY:  [facility]   PHYSICIAN:  Ollen Gross. Vernell Morgans, M.D. DATE OF BIRTH:  12/03/1966   DATE OF PROCEDURE:  10/13/2007  DATE OF DISCHARGE:                               OPERATIVE REPORT   PREOPERATIVE DIAGNOSIS:  Appendicitis.   POSTOPERATIVE DIAGNOSIS:  Appendicitis.   PROCEDURE:  Laparoscopic appendectomy.   SURGEON:  Ollen Gross. Vernell Morgans, M.D.   ANESTHESIA:  General endotracheal.   PROCEDURE:  After informed consent was obtained, the patient was brought  to the operating room, placed in supine position on the table.  After  adequate induction of general anesthesia, the patient's abdomen was  prepped with Betadine and draped in usual sterile manner.  The area  below the umbilicus was infiltrated 0.25% Marcaine.  Small incision was  made with a 15 blade knife.  This incision was carried down through the  subcutaneous tissue bluntly with hemostat and [employer] retractors until  the linea alba was identified.  The linea alba was incised with a 15  blade knife and each side was grasped with Kocher clamps and elevated  anteriorly.  The preperitoneal space was probed bluntly with a hemostat  until the peritoneum was opened and access was gained to the abdominal  cavity.  A 0 Vicryl pursestring stitch was placed in the fascia  surrounding the opening.  A Hasson cannula was placed through the  opening and anchored in placed with the previously placed Vicryl  pursestring stitch.  The abdomen was then insufflated carbon dioxide  without difficulty.  The patient was placed in Trendelenburg position  and rotated with the right side up.  A laparoscope was inserted through  the Hasson cannula and the lower quadrant was inspected.  An enlarged,  inflamed appendix was readily identified.  Next a suprapubic region was  infiltrated with  0.25% Marcaine.  Small incision was made with a 15  blade knife and a 10 meters port was placed bluntly through this  incision into the abdominal cavity under direct vision.  A site between  the two was chosen for a 5 mm port.  This area was infiltrated 0.25%  Marcaine.  A small stab incision was made with a 15 blade knife and a 5  mm port was placed bluntly through this incision into the abdominal  cavity under direct vision.  The laparoscope was then moved to the  suprapubic port.  Using the [facility] grasper and harmonic scalpel, the  appendix was able to be elevated.  The mesoappendix was taken down  sharply with the harmonic scalpel until the base of the appendix at its  junction with the cecum was readily identified, cleared of all tissue  debris.  A laparoscopic 45-mm blue load 6 row stapler was then placed  through the Hasson cannula and placed across the base the appendix at  its junction with the cecum, clamped and fired thereby dividing the base  of the appendix between staple lines.  A laparoscopic bag was then  inserted through the Hasson cannula, the appendix placed in the bag and  bag was sealed.  The staple line was inspected and found to be  completely hemostatic.  Abdomen was then irrigated with copious amounts  of saline.  The abdomen was inspected generally and no other  abnormalities were noted.  We could not see her ovarian cyst very well  in her left pelvis.  At this point the appendix was then removed with  the bag with the Hasson cannula through the infraumbilical port without  difficulty.  The fascial defect was closed with the previously placed  Vicryl pursestring stitch as well as another interrupted figure-of-eight  0 Vicryl stitch.  The rest of ports were removed under direct vision and  found to be hemostatic.  Gas was allowed to escape.  The skin incisions  were all closed with  interrupted 4-0 Monocryl subcuticular stitches.  Benzoin, Steri-Strips  were  applied.  The patient tolerated well.  At end of the case all  needle, sponge and instrument counts correct.  The patient was awakened  and taken to recovery room in stable condition.      Ollen Gross. Vernell Morgans, M.D.  Electronically Signed     PST/MEDQ  D:  10/13/2007  T:  10/14/2007  Job:  [id number]

## 2011-04-25 NOTE — H&P (Signed)
Lori [name], Lori [name]                 ACCOUNT NO.:  [id number]   MEDICAL RECORD NO.:  [id number]          PATIENT TYPE:  EMS   LOCATION:  ED                           FACILITY:  [facility]   PHYSICIAN:  Ollen Gross. Vernell Morgans, M.D. DATE OF BIRTH:  September 09, 1966   DATE OF ADMISSION:  10/13/2007  DATE OF DISCHARGE:                              HISTORY & PHYSICAL   Ms. Darcus Austin is a 45 year old white female who presents with right lower  quadrant pain that started yesterday morning.  The pain has been  associated with nausea and vomiting.  She has had great fevers at home.  The pain has gotten worse over the last day.  She denies any chest pain,  shortness of breath, diarrhea, or dysuria.  Her other review of systems  are unremarkable.   PAST MEDICAL HISTORY:  Significant for reflux.   PAST SURGICAL HISTORY:  Significant for:  1. A C-section.  2. A diskectomy.  3. A lumpectomy.  4. A C-spine fusion.   MEDICATIONS:  Zegerid.   ALLERGIES:  NO KNOWN DRUG ALLERGIES.   SOCIAL HISTORY:  She smokes about a pack of cigarettes a day and only on  occasion drinks any alcohol.   FAMILY HISTORY:  Noncontributory.   PHYSICAL EXAM:  Temp is 97.5  Blood pressure 113/64.  Pulse of 91.  GENERAL:  She is well-developed, well-nourished white female in no acute  distress.  SKIN:  Warm and dry.  No jaundice.  EYES:  Her extraocular muscles are intact.  Pupils equal, round, and  reactive to light.  Sclerae nonicteric.  LUNGS:  Clear bilaterally with no use of accessory respiratory muscles.  HEART:  Regular rate and rhythm with an impulse pulse in the left chest.  ABDOMEN:  Soft.  Focal right lower quadrant tenderness but no  peritonitis.  No palpable mass or hepatosplenomegaly.  EXTREMITIES:  No cyanosis, clubbing, or edema with good strength of her  arms and legs.  PSYCHOLOGICALLY:  She is alert and oriented x3 with no evidence of  anxiety or depression.   On review of her lab work, it was significant for an  elevated white  count at 23,500.  Her CT scan was reviewed with the radiologist and did  show an enlarged inflamed appendix consistent with acute appendicitis.  No signs of perforation.   ASSESSMENT AND PLAN:  This is a 45 year old white female with what  appears to be acute appendicitis.  Because of the risk of perforation  and sepsis, I think she would benefit from having her appendix removed.  She  would also like to have this done.  I have explained to her in detail  the risks and benefits of the operation to remove the appendix, as well  as some of the technical aspects and she understands and wished to  proceed.  We will plan to do this tonight in the operating room as soon  as we have a room available.      Ollen Gross. Vernell Morgans, M.D.  Electronically Signed     PST/MEDQ  D:  10/13/2007  T:  10/14/2007  Job:  [id number]

## 2011-04-28 NOTE — Op Note (Signed)
NAMEPORSHE, FLEAGLE                 ACCOUNT NO.:  [id number]   MEDICAL RECORD NO.:  [id number]          PATIENT TYPE:  AMB   LOCATION:  SDS                          FACILITY:  [facility]   PHYSICIAN:  Stefani Dama, M.D.  DATE OF BIRTH:  1966/10/03   DATE OF PROCEDURE:  08/06/2006  DATE OF DISCHARGE:                                 OPERATIVE REPORT   PREOPERATIVE DIAGNOSIS:  Herniated nucleus pulposus C6-C7 left, with left  cervical radiculopathy, spondylosis C5-C6 with radiculopathy.   POSTOPERATIVE DIAGNOSIS:  Herniated nucleus pulposus C6-C7 left, with left  cervical radiculopathy, spondylosis C5-C6 with radiculopathy.   PROCEDURES:  Anterior cervical decompression C5-6 and C6-7; arthrodesis with  structural PEEK spacer autograft and allograft, Alphatec plate fixation C5-  C7.   SURGEON:  Dr. Danielle Dess.   ANESTHESIA:  General endotracheal.   INDICATIONS:  Ms. Taneshia Lorence is a 45 year old individual who has had  significant neck, shoulder and left arm pain, severe and acute for about a  week.  She has had cervical radiculopathy on the left side with weakness in  the triceps, weakness in the grip, weakness in the wrist extensors; also has  some modest weakness in the biceps.  She has severe spondylosis C5-6 and a  herniated nucleus pulposus C6-7.  She has inconsolable despite strong  narcotic pain medications and steroids.   PROCEDURE:  The patient was brought to the operating room, placed on the  table in supine position.  After smooth induction of general endotracheal  anesthesia, she was placed in 5 pounds of halter traction.  Neck was prepped  with DuraPrep and draped in a sterile fashion.  A transverse incision was  made through the left side of neck, and the dissection was carried down to  the prevertebral space.  First identifiable disk space was noted to be that  of C5-6.  C6-7 was then opened after stripping the longus coli muscle off  either side of the midline and placing  a Agricultural engineer.  The disk space  was then evacuated of a significant quantity of severely degenerated disk  material.  As a region of the left side a posterior longitudinal ligament  was reached, there was noted to be an opening with several fragments of disk  material poking through it.  These were removed, and the opening was  enlarged further with a 2-mm Kerrison punch.  Further fragments of disks  were encountered out along the track of the nerve root.  These were removed.  Some epidural veins in this area were cauterized and divided with micro  scissors, and some small pledgets of Gelfoam were left over this area to  maintain hemostasis.  Small uncinate spur was drilled down on the right  side.  Decompression was easily carried out.  The endplates were then  drilled smoothed to remove any remnants of any cartilaginous material.  In  the end, this interspace was sized for a 7-mm medium-sized Alphatec PEEK  spacer.  This was filled with a combination of autograft from the patient's  own drilling of the bone and allograft in  the form of demineralized bone  matrix.  C5-6 was then decompressed.  This was noted to be much more  spondylitic, and osteophytes were much larger.  Uncinate process spurs were  drilled away, as was the posterior spur from the inferior margin of the body  of C5.  This area was then decompressed, and a 6-mm medium-sized spacer was  filled with the patient's own bone and demineralized bone matrix.  Ventral  aspects of the vertebral bodies were then checked, and the osteophytes were  trimmed.  A 37-mm standard size Alphatec plate was then fitted with 6  locking 14-mm screws.  Fixed angle screws were used in C6 and C7.  C5 had  variable angle screws.  Hemostasis was then obtained in the soft tissues.  Localizing radiograph identified good position of all the hardware.  The  screws were locked in the final locking position.  Platysma was then closed  with 3-0 Vicryl  in interrupted fashion; 3-0 Vicryl was used in the  subcuticular tissues; and Dermabond was placed on the skin.  The patient  tolerated the procedure well and was returned to the recovery room in stable  condition.      Stefani Dama, M.D.  Electronically Signed     HJE/MEDQ  D:  08/06/2006  T:  08/07/2006  Job:  [id number]

## 2011-06-15 ENCOUNTER — Encounter: Payer: Self-pay | Admitting: Internal Medicine

## 2011-06-16 ENCOUNTER — Ambulatory Visit (INDEPENDENT_AMBULATORY_CARE_PROVIDER_SITE_OTHER): Payer: Managed Care, Other (non HMO) | Admitting: Internal Medicine

## 2011-06-16 ENCOUNTER — Telehealth: Payer: Self-pay | Admitting: Internal Medicine

## 2011-06-16 ENCOUNTER — Encounter: Payer: Self-pay | Admitting: Internal Medicine

## 2011-06-16 VITALS — BP 110/70 | Temp 98.1°F | Wt 179.0 lb

## 2011-06-16 DIAGNOSIS — J069 Acute upper respiratory infection, unspecified: Secondary | ICD-10-CM

## 2011-06-16 MED ORDER — FLUTICASONE PROPIONATE 50 MCG/ACT NA SUSP: 2.0000 | Freq: Every day | NASAL | Status: DC

## 2011-06-16 MED ORDER — HYDROCODONE-ACETAMINOPHEN 7.5-750 MG PO TABS: 1.0000 | ORAL_TABLET | Freq: Four times a day (QID) | ORAL | Status: DC | PRN

## 2011-06-16 NOTE — Telephone Encounter (Signed)
rx efiled to [facility]

## 2011-06-16 NOTE — Telephone Encounter (Signed)
Pt said to call nasal spray in to [facility] in [place]

## 2011-06-16 NOTE — Progress Notes (Signed)
  Subjective:    Patient ID: Lori [name], female    DOB: 1966-08-14, 45 y.o.   MRN: [id number]  HPI  45 year old patient who is seen today for followup she has multiple complaints including fatigue and sinus headaches and nausea altered taste sensation fullness and ringing in the left ear. She describes a general sense of unwellness. She describes excess gaseousness and bloating. In December of last year she underwent what sounds like a Nissen fundoplication for severe reflux. She does have a history of allergic rhinitis and tobacco use. No fever or purulent sputum production at the present time she is taking no medications. Her this week she was able to work 3 12 hour shifts    Review of Systems  Constitutional: Positive for fatigue.  HENT: Positive for hearing loss, congestion and rhinorrhea. Negative for sore throat, dental problem, sinus pressure and tinnitus.   Eyes: Negative for pain, discharge and visual disturbance.  Respiratory: Negative for cough and shortness of breath.   Cardiovascular: Negative for chest pain, palpitations and leg swelling.  Gastrointestinal: Negative for nausea, vomiting, abdominal pain, diarrhea, constipation, blood in stool and abdominal distention.  Genitourinary: Negative for dysuria, urgency, frequency, hematuria, flank pain, vaginal bleeding, vaginal discharge, difficulty urinating, vaginal pain and pelvic pain.  Musculoskeletal: Negative for joint swelling, arthralgias and gait problem.  Skin: Negative for rash.  Neurological: Positive for headaches. Negative for dizziness, syncope, speech difficulty, weakness and numbness.  Hematological: Negative for adenopathy.  Psychiatric/Behavioral: Negative for behavioral problems, dysphoric mood and agitation. The patient is not nervous/anxious.        Objective:   Physical Exam  Constitutional: She is oriented to person, place, and time. She appears well-developed and well-nourished.  HENT:  Head:  Normocephalic.  Right Ear: External ear normal.  Left Ear: External ear normal.  Mouth/Throat: Oropharynx is clear and moist.  Eyes: Conjunctivae and EOM are normal. Pupils are equal, round, and reactive to light.  Neck: Normal range of motion. Neck supple. No thyromegaly present.  Cardiovascular: Normal rate, regular rhythm, normal heart sounds and intact distal pulses.   Pulmonary/Chest: Effort normal and breath sounds normal.  Abdominal: Soft. Bowel sounds are normal. She exhibits no mass. There is no tenderness.  Musculoskeletal: Normal range of motion.  Lymphadenopathy:    She has no cervical adenopathy.  Neurological: She is alert and oriented to person, place, and time.  Skin: Skin is warm and dry. No rash noted.  Psychiatric: She has a normal mood and affect. Her behavior is normal.          Assessment & Plan:   Multiple somatic complaints. Probable viral syndrome. She requests a refill on her fluticasone this was refilled with his symptomatically with decongestants. Will call if unimproved

## 2011-06-16 NOTE — Patient Instructions (Signed)
Call or return to clinic prn if these symptoms worsen or fail to improve as anticipated.

## 2011-06-30 ENCOUNTER — Telehealth: Payer: Self-pay | Admitting: Internal Medicine

## 2011-06-30 NOTE — Telephone Encounter (Signed)
It was not rxs, they were samples that Dr Kirtland Bouchard gave her. Still did not work.

## 2011-06-30 NOTE — Telephone Encounter (Signed)
Called pt and gave dr. Vernon Prey instructions

## 2011-06-30 NOTE — Telephone Encounter (Signed)
Please advise 

## 2011-06-30 NOTE — Telephone Encounter (Signed)
Continue to take fluticasone  Allegra-D twice daily

## 2011-06-30 NOTE — Telephone Encounter (Signed)
Was rx'd some meds for her ears and head a few weeks ago. Not helping. [facility] [place].

## 2011-08-29 ENCOUNTER — Encounter: Payer: Self-pay | Admitting: Internal Medicine

## 2011-08-29 ENCOUNTER — Ambulatory Visit (INDEPENDENT_AMBULATORY_CARE_PROVIDER_SITE_OTHER): Payer: Managed Care, Other (non HMO) | Admitting: Internal Medicine

## 2011-08-29 DIAGNOSIS — M549 Dorsalgia, unspecified: Secondary | ICD-10-CM

## 2011-08-29 DIAGNOSIS — M1990 Unspecified osteoarthritis, unspecified site: Secondary | ICD-10-CM

## 2011-08-29 MED ORDER — DIAZEPAM 5 MG PO TABS: 5.0000 mg | ORAL_TABLET | Freq: Every evening | ORAL | Status: DC | PRN

## 2011-08-29 MED ORDER — METHYLPREDNISOLONE ACETATE 80 MG/ML IJ SUSP
80.0000 mg | Freq: Once | INTRAMUSCULAR | Status: AC
  Administered 2011-08-29: 80 mg via INTRAMUSCULAR

## 2011-08-29 MED ORDER — HYDROCODONE-ACETAMINOPHEN 7.5-750 MG PO TABS: 1.0000 | ORAL_TABLET | Freq: Four times a day (QID) | ORAL | Status: DC | PRN

## 2011-08-29 NOTE — Patient Instructions (Signed)
You  may move around, but avoid painful motions and activities.  Apply heat  to the sore area for 15 to 20 minutes 3 or 4 times daily for the next two to 3 days.  Take 400-600 mg of ibuprofen ( Advil, Motrin) with food every 4 to 6 hours as needed for pain relief or control of fever  Call or return to clinic prn if these symptoms worsen or fail to improve as anticipated.

## 2011-08-29 NOTE — Progress Notes (Signed)
  Subjective:    Patient ID: Lori [name], female    DOB: [birth year], 45 y.o.   MRN: [id number]  HPI  45 year old patient who presents with a chief complaint of upper back pain. Her work does require a considerable physical activity. Saturday morning she had a considerable interscapular back pain the right greater than left. She spent much the day in bed ice in the area down. She has been quite uncomfortable with the episodes of intense muscle spasm. She has been using ibuprofen tramadol and hydrocodone with only minimal benefit.    Review of Systems  Constitutional: Negative.   HENT: Negative for hearing loss, congestion, sore throat, rhinorrhea, dental problem, sinus pressure and tinnitus.   Eyes: Negative for pain, discharge and visual disturbance.  Respiratory: Negative for cough and shortness of breath.   Cardiovascular: Negative for chest pain, palpitations and leg swelling.  Gastrointestinal: Negative for nausea, vomiting, abdominal pain, diarrhea, constipation, blood in stool and abdominal distention.  Genitourinary: Negative for dysuria, urgency, frequency, hematuria, flank pain, vaginal bleeding, vaginal discharge, difficulty urinating, vaginal pain and pelvic pain.  Musculoskeletal: Positive for back pain. Negative for joint swelling, arthralgias and gait problem.  Skin: Negative for rash.  Neurological: Negative for dizziness, syncope, speech difficulty, weakness, numbness and headaches.  Hematological: Negative for adenopathy.  Psychiatric/Behavioral: Negative for behavioral problems, dysphoric mood and agitation. The patient is not nervous/anxious.        Objective:   Physical Exam  Constitutional: She is oriented to person, place, and time. She appears well-developed and well-nourished.  HENT:  Head: Normocephalic.  Right Ear: External ear normal.  Left Ear: External ear normal.  Eyes: Conjunctivae and EOM are normal. Pupils are equal, round, and reactive to light.  Neck:  No thyromegaly present.  Cardiovascular: Normal rate, regular rhythm, normal heart sounds and intact distal pulses.   Pulmonary/Chest: Effort normal and breath sounds normal.  Abdominal: She exhibits no mass. There is no tenderness.  Musculoskeletal: Normal range of motion.       Full range of motion cervical spine. No low back pain. She had tenderness in the interscapular region  right greater than left.  Lymphadenopathy:    She has no cervical adenopathy.  Neurological: She is alert and oriented to person, place, and time.  Skin: Skin is warm and dry. No rash noted.  Psychiatric: She has a normal mood and affect. Her behavior is normal.          Assessment & Plan:    Upper back pain. Suspect musculoligamentous. We'll treat with Depo-Medrol. We'll substitute warm compresses rest and gentle massage and discontinue application of the eyes. We'll continue anti-inflammatories. We'll refill her analgesics and placed on muscle relaxers.

## 2011-09-19 LAB — BASIC METABOLIC PANEL
BUN: 14
CO2: 25
Calcium: 9.8
Chloride: 104
Creatinine, Ser: 0.79
GFR calc Af Amer: >60
GFR calc non Af Amer: >60
Glucose, Bld: 118 — ABNORMAL HIGH
Potassium: 4.6
Sodium: 138

## 2011-09-19 LAB — CBC
HCT: 42.3
Hemoglobin: 14.7
MCHC: 34.9
MCV: 91.2
Platelets: 241
RBC: 4.64
RDW: 13.3
WBC: 23.5 — ABNORMAL HIGH

## 2011-09-19 LAB — DIFFERENTIAL
Basophils Absolute: 0.1
Basophils Relative: 0
Eosinophils Absolute: 0.1
Eosinophils Relative: 0
Lymphocytes Relative: 14
Lymphs Abs: 3.2
Monocytes Absolute: 1.2 — ABNORMAL HIGH
Monocytes Relative: 5
Neutro Abs: 18.9 — ABNORMAL HIGH
Neutrophils Relative %: 81 — ABNORMAL HIGH

## 2011-09-19 LAB — URINALYSIS, ROUTINE W REFLEX MICROSCOPIC
Bilirubin Urine: NEGATIVE
Glucose, UA: NEGATIVE
Ketones, ur: NEGATIVE
Leukocytes, UA: NEGATIVE
Nitrite: NEGATIVE
Protein, ur: NEGATIVE
Specific Gravity, Urine: 1.032 — ABNORMAL HIGH
Urobilinogen, UA: 0.2
pH: 5.5

## 2011-09-19 LAB — URINE MICROSCOPIC-ADD ON

## 2011-09-19 LAB — PREGNANCY, URINE: Preg Test, Ur: NEGATIVE

## 2012-03-05 ENCOUNTER — Other Ambulatory Visit: Payer: Self-pay

## 2012-03-05 MED ORDER — HYDROCODONE-ACETAMINOPHEN 7.5-750 MG PO TABS: 1.0000 | ORAL_TABLET | Freq: Four times a day (QID) | ORAL | Status: DC | PRN

## 2012-03-05 NOTE — Telephone Encounter (Signed)
ok 

## 2012-03-05 NOTE — Telephone Encounter (Signed)
[facility] 

## 2012-03-05 NOTE — Telephone Encounter (Signed)
[facility] [place] [facility] vicodin es Last seen 08/29/11 - back pain Last written 08/29/11 # 60 1RF Please advise

## 2012-03-27 ENCOUNTER — Other Ambulatory Visit: Payer: Self-pay | Admitting: Internal Medicine

## 2012-05-23 ENCOUNTER — Other Ambulatory Visit: Payer: Self-pay | Admitting: Obstetrics and Gynecology

## 2012-05-23 DIAGNOSIS — R109 Unspecified abdominal pain: Secondary | ICD-10-CM

## 2012-05-24 ENCOUNTER — Other Ambulatory Visit: Payer: Managed Care, Other (non HMO)

## 2012-05-28 ENCOUNTER — Ambulatory Visit
Admission: RE | Admit: 2012-05-28 | Discharge: 2012-05-28 | Disposition: A | Discharge status: Home / Self Care | Payer: Managed Care, Other (non HMO) | Source: Ambulatory Visit | Attending: Obstetrics and Gynecology | Admitting: Obstetrics and Gynecology

## 2012-05-28 DIAGNOSIS — R109 Unspecified abdominal pain: Secondary | ICD-10-CM

## 2012-05-28 MED ORDER — IOHEXOL 300 MG/ML  SOLN
100.0000 mL | Freq: Once | INTRAMUSCULAR | Status: AC | PRN
  Administered 2012-05-28: 100 mL via INTRAVENOUS

## 2012-06-04 ENCOUNTER — Other Ambulatory Visit: Payer: Self-pay | Admitting: Internal Medicine

## 2012-06-24 ENCOUNTER — Ambulatory Visit (INDEPENDENT_AMBULATORY_CARE_PROVIDER_SITE_OTHER): Payer: Managed Care, Other (non HMO) | Admitting: Internal Medicine

## 2012-06-24 ENCOUNTER — Encounter: Payer: Self-pay | Admitting: Internal Medicine

## 2012-06-24 VITALS — BP 110/70 | Temp 98.9°F | Wt 194.0 lb

## 2012-06-24 DIAGNOSIS — M25569 Pain in unspecified knee: Secondary | ICD-10-CM

## 2012-06-24 DIAGNOSIS — M25561 Pain in right knee: Secondary | ICD-10-CM

## 2012-06-24 DIAGNOSIS — M1990 Unspecified osteoarthritis, unspecified site: Secondary | ICD-10-CM

## 2012-06-24 DIAGNOSIS — M25562 Pain in left knee: Secondary | ICD-10-CM

## 2012-06-24 MED ORDER — HYDROCODONE-ACETAMINOPHEN 7.5-750 MG PO TABS: 1.0000 | ORAL_TABLET | Freq: Four times a day (QID) | ORAL | Status: DC | PRN

## 2012-06-24 MED ORDER — DIAZEPAM 5 MG PO TABS: 5.0000 mg | ORAL_TABLET | Freq: Every evening | ORAL | Status: DC | PRN

## 2012-06-24 NOTE — Progress Notes (Signed)
  Subjective:    Patient ID: Lori [name], female    DOB: 1966-07-30, 46 y.o.   MRN: [id number]  HPI  46 year old patient who has a history of osteoarthritis. She is seen today complaining of bilateral knee pain. 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 She has a history of cervical and lumbar pain as well. She is status post ACDF C5-7. She's had C-spine surgery in 2007 and 2011. She also had lumbar surgery performed due to the L5 disc in 2000. Dr. Danielle Dess performed epidurals in 2011  without much benefit.  Past Medical History  Diagnosis Date  . ALLERGIC RHINITIS 03/14/2010  . BACK PAIN 01/24/2010  . GERD 12/01/2008  . HIATAL HERNIA 05/27/2010  . 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 OSTEOARTHRITIS 12/01/2008  . 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 TOBACCO USE 12/01/2008  . VITAMIN D DEFICIENCY 05/23/2010    History   Social History  . Marital Status: Married    Spouse Name: N/A    Number of Children: N/A  . Years of Education: N/A   Occupational History  . Not on file.   Social History Main Topics  . Smoking status: Current Everyday Smoker -- 1.0 packs/day    Types: Cigarettes  . Smokeless tobacco: Never Used  . Alcohol Use: Yes     rarely  . Drug Use: No  . Sexually Active: Not on file   Other Topics Concern  . Not on file   Social History Narrative  . No narrative on file    Past Surgical History  Procedure Date  . Appendectomy   . Breast surgery     lumpectomy  . Lumbar laminectomy   . Cervical fusion   . Esophagogastroduodenoscopy   . Esophageal manometry     No family history on file.  Allergies  Allergen Reactions  . Morphine     REACTION: hyper    Current Outpatient Prescriptions on File Prior to Visit  Medication Sig Dispense Refill  . diazepam (VALIUM) 5 MG tablet Take 1 tablet (5 mg total) by mouth at bedtime as needed.  30 tablet  1  . furosemide (LASIX) 20 MG tablet TAKE 1 TABLET BY MOUTH DAILY FOR  SWELLING  90 tablet  1  . HYDROcodone-acetaminophen (VICODIN ES) 7.5-750 MG per tablet Take 1 tablet by mouth every 6 (six) hours as needed.  60 tablet  1  . DISCONTD: fluticasone (FLONASE) 50 MCG/ACT nasal spray Place 2 sprays into the nose daily.  16 g  12    BP 110/70  Temp 98.9 F (37.2 C) (Oral)  Wt 194 lb (87.998 kg)  LMP 05/12/2012       Review of Systems  Musculoskeletal: Positive for back pain and arthralgias.       Objective:   Physical Exam  Constitutional: She appears well-developed and well-nourished. No distress.  Neck: No thyromegaly present.  Musculoskeletal:       Negative straight leg test Neurovascular intact  Lymphadenopathy:    She has no cervical adenopathy.          Assessment & Plan:   Osteoarthritis Chronic neck pain  status post surgery x2 Chronic low back pain status post surgery x1  Bilateral knee pain. Patient has requested orthopedic referral

## 2012-06-24 NOTE — Patient Instructions (Signed)
Orthopedic followup as scheduled  Call or return to clinic prn if these symptoms worsen or fail to improve as anticipated.  

## 2012-07-08 ENCOUNTER — Other Ambulatory Visit (INDEPENDENT_AMBULATORY_CARE_PROVIDER_SITE_OTHER): Payer: Managed Care, Other (non HMO)

## 2012-07-08 DIAGNOSIS — Z0000 Encounter for general adult medical examination without abnormal findings: Secondary | ICD-10-CM

## 2012-07-08 LAB — HEPATIC FUNCTION PANEL
ALT: 20 U/L (ref 0–35)
AST: 20 U/L (ref 0–37)
Albumin: 4.4 g/dL (ref 3.5–5.2)
Alkaline Phosphatase: 58 U/L (ref 39–117)
Bilirubin, Direct: 0 mg/dL (ref 0.0–0.3)
Total Bilirubin: 0.6 mg/dL (ref 0.3–1.2)
Total Protein: 7.3 g/dL (ref 6.0–8.3)

## 2012-07-08 LAB — POCT URINALYSIS DIPSTICK
Bilirubin, UA: NEGATIVE
Glucose, UA: NEGATIVE
Ketones, UA: NEGATIVE
Leukocytes, UA: NEGATIVE
Nitrite, UA: NEGATIVE
Protein, UA: NEGATIVE
Spec Grav, UA: 1.01
Urobilinogen, UA: 0.2
pH, UA: 6

## 2012-07-08 LAB — BASIC METABOLIC PANEL
BUN: 15 mg/dL (ref 6–23)
CO2: 26 mEq/L (ref 19–32)
Calcium: 9.5 mg/dL (ref 8.4–10.5)
Chloride: 101 mEq/L (ref 96–112)
Creatinine, Ser: 0.8 mg/dL (ref 0.4–1.2)
GFR: 78.18 mL/min (ref >60.00)
Glucose, Bld: 87 mg/dL (ref 70–99)
Potassium: 4.4 mEq/L (ref 3.5–5.1)
Sodium: 136 mEq/L (ref 135–145)

## 2012-07-08 LAB — CBC WITH DIFFERENTIAL/PLATELET
Basophils Absolute: 0 10*3/uL (ref 0.0–0.1)
Basophils Relative: 0.4 % (ref 0.0–3.0)
Eosinophils Absolute: 0.2 10*3/uL (ref 0.0–0.7)
Eosinophils Relative: 2 % (ref 0.0–5.0)
HCT: 43.5 % (ref 36.0–46.0)
Hemoglobin: 14.4 g/dL (ref 12.0–15.0)
Lymphocytes Relative: 36.8 % (ref 12.0–46.0)
Lymphs Abs: 4.5 10*3/uL — ABNORMAL HIGH (ref 0.7–4.0)
MCHC: 33.1 g/dL (ref 30.0–36.0)
MCV: 96.9 fl (ref 78.0–100.0)
Monocytes Absolute: 0.5 10*3/uL (ref 0.1–1.0)
Monocytes Relative: 4 % (ref 3.0–12.0)
Neutro Abs: 6.9 10*3/uL (ref 1.4–7.7)
Neutrophils Relative %: 56.8 % (ref 43.0–77.0)
Platelets: 231 10*3/uL (ref 150.0–400.0)
RBC: 4.49 Mil/uL (ref 3.87–5.11)
RDW: 14.6 % (ref 11.5–14.6)
WBC: 12.1 10*3/uL — ABNORMAL HIGH (ref 4.5–10.5)

## 2012-07-08 LAB — LDL CHOLESTEROL, DIRECT: Direct LDL: 151.7 mg/dL

## 2012-07-08 LAB — LIPID PANEL
Cholesterol: 215 mg/dL — ABNORMAL HIGH (ref 0–200)
HDL: 41.2 mg/dL (ref >39.00)
Total CHOL/HDL Ratio: 5
Triglycerides: 157 mg/dL — ABNORMAL HIGH (ref 0.0–149.0)
VLDL: 31.4 mg/dL (ref 0.0–40.0)

## 2012-07-08 LAB — TSH: TSH: 1.27 u[IU]/mL (ref 0.35–5.50)

## 2012-07-16 ENCOUNTER — Ambulatory Visit (INDEPENDENT_AMBULATORY_CARE_PROVIDER_SITE_OTHER): Payer: Managed Care, Other (non HMO) | Admitting: Internal Medicine

## 2012-07-16 ENCOUNTER — Encounter: Payer: Self-pay | Admitting: Internal Medicine

## 2012-07-16 VITALS — BP 122/74 | HR 76 | Temp 98.2°F | Resp 20 | Ht 65.0 in | Wt 196.0 lb

## 2012-07-16 DIAGNOSIS — J309 Allergic rhinitis, unspecified: Secondary | ICD-10-CM

## 2012-07-16 DIAGNOSIS — Z0000 Encounter for general adult medical examination without abnormal findings: Secondary | ICD-10-CM

## 2012-07-16 DIAGNOSIS — K219 Gastro-esophageal reflux disease without esophagitis: Secondary | ICD-10-CM

## 2012-07-16 DIAGNOSIS — M549 Dorsalgia, unspecified: Secondary | ICD-10-CM

## 2012-07-16 DIAGNOSIS — E559 Vitamin D deficiency, unspecified: Secondary | ICD-10-CM

## 2012-07-16 DIAGNOSIS — F17200 Nicotine dependence, unspecified, uncomplicated: Secondary | ICD-10-CM

## 2012-07-16 MED ORDER — MELOXICAM 15 MG PO TABS: 15.0000 mg | ORAL_TABLET | Freq: Every day | ORAL | Status: DC

## 2012-07-16 MED ORDER — DIAZEPAM 5 MG PO TABS: 5.0000 mg | ORAL_TABLET | Freq: Every evening | ORAL | Status: DC | PRN

## 2012-07-16 MED ORDER — FUROSEMIDE 20 MG PO TABS: 20.0000 mg | ORAL_TABLET | Freq: Every day | ORAL | Status: DC

## 2012-07-16 MED ORDER — FLUTICASONE PROPIONATE 50 MCG/ACT NA SUSP: 2.0000 | Freq: Every day | NASAL | Status: DC

## 2012-07-16 MED ORDER — HYDROCODONE-ACETAMINOPHEN 7.5-750 MG PO TABS: 1.0000 | ORAL_TABLET | Freq: Four times a day (QID) | ORAL | Status: DC | PRN

## 2012-07-16 NOTE — Progress Notes (Signed)
Subjective:    Patient ID: Lori [name], female    DOB: November 27, 1966, 46 y.o.   MRN: [id number]  HPI  46 year old patient who is seen today for followup and preventive health examination. She's had a recent gynecologic evaluation. She has been followed by orthopedics GYN as well as gastroenterology. Her reflux symptoms have been stable. She does have a history of vitamin D deficiency but no longer takes supplements. Laboratory studies were reviewed.  Past Medical History  Diagnosis Date  . ALLERGIC RHINITIS 03/14/2010  . BACK PAIN 01/24/2010  . GERD 12/01/2008  . HIATAL HERNIA 05/27/2010  . 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 OSTEOARTHRITIS 12/01/2008  . 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 TOBACCO USE 12/01/2008  . VITAMIN D DEFICIENCY 05/23/2010    History   Social History  . Marital Status: Married    Spouse Name: N/A    Number of Children: N/A  . Years of Education: N/A   Occupational History  . Not on file.   Social History Main Topics  . Smoking status: Current Everyday Smoker -- 1.0 packs/day    Types: Cigarettes  . Smokeless tobacco: Never Used  . Alcohol Use: Yes     rarely  . Drug Use: No  . Sexually Active: Not on file   Other Topics Concern  . Not on file   Social History Narrative  . No narrative on file    Past Surgical History  Procedure Date  . Appendectomy   . Breast surgery     lumpectomy  . Lumbar laminectomy   . Cervical fusion   . Esophagogastroduodenoscopy   . Esophageal manometry     No family history on file.  Allergies  Allergen Reactions  . Morphine     REACTION: hyper    Current Outpatient Prescriptions on File Prior to Visit  Medication Sig Dispense Refill  . diazepam (VALIUM) 5 MG tablet Take 1 tablet (5 mg total) by mouth at bedtime as needed.  30 tablet  1  . fluticasone (FLONASE) 50 MCG/ACT nasal spray Place 2 sprays into the nose daily.      . furosemide (LASIX) 20 MG tablet TAKE 1 TABLET BY MOUTH DAILY FOR SWELLING  90  tablet  1  . HYDROcodone-acetaminophen (VICODIN ES) 7.5-750 MG per tablet Take 1 tablet by mouth every 6 (six) hours as needed.  90 tablet  2    BP 122/74  Pulse 76  Temp 98.2 F (36.8 C) (Oral)  Resp 20  Ht 5\' 5"  (1.651 m)  Wt 196 lb (88.905 kg)  BMI 32.62 kg/m2  SpO2 98%          Review of Systems  Constitutional: Negative for fever, appetite change, fatigue and unexpected weight change.  HENT: Negative for hearing loss, ear pain, nosebleeds, congestion, sore throat, mouth sores, trouble swallowing, neck stiffness, dental problem, voice change, sinus pressure and tinnitus.   Eyes: Negative for photophobia, pain, redness and visual disturbance.  Respiratory: Negative for cough, chest tightness and shortness of breath.   Cardiovascular: Negative for chest pain, palpitations and leg swelling.  Gastrointestinal: Negative for nausea, vomiting, abdominal pain, diarrhea, constipation, blood in stool, abdominal distention and rectal pain.  Genitourinary: Negative for dysuria, urgency, frequency, hematuria, flank pain, vaginal bleeding, vaginal discharge, difficulty urinating, genital sores, vaginal pain, menstrual problem and pelvic pain.  Musculoskeletal: Positive for back pain and arthralgias.  Skin: Negative for rash.  Neurological: Negative for dizziness, syncope, speech difficulty, weakness, light-headedness, numbness and headaches.  Hematological:  Negative for adenopathy. Does not bruise/bleed easily.  Psychiatric/Behavioral: Negative for suicidal ideas, behavioral problems, self-injury, dysphoric mood and agitation. The patient is not nervous/anxious.        Objective:   Physical Exam  Constitutional: She is oriented to person, place, and time. She appears well-developed and well-nourished.  HENT:  Head: Normocephalic and atraumatic.  Right Ear: External ear normal.  Left Ear: External ear normal.  Mouth/Throat: Oropharynx is clear and moist.  Eyes: Conjunctivae and EOM  are normal.  Neck: Normal range of motion. Neck supple. No JVD present. No thyromegaly present.  Cardiovascular: Normal rate, regular rhythm, normal heart sounds and intact distal pulses.   No murmur heard. Pulmonary/Chest: Effort normal and breath sounds normal. She has no wheezes. She has no rales.  Abdominal: Soft. Bowel sounds are normal. She exhibits no distension and no mass. There is no tenderness. There is no rebound and no guarding.  Musculoskeletal: Normal range of motion. She exhibits no edema and no tenderness.  Neurological: She is alert and oriented to person, place, and time. She has normal reflexes. No cranial nerve deficit. She exhibits normal muscle tone. Coordination normal.  Skin: Skin is warm and dry. No rash noted.  Psychiatric: She has a normal mood and affect. Her behavior is normal.          Assessment & Plan:   Preventive health examination. Tobacco use discussed Vitamin E deficiency. We'll resume supplements Allergic rhinitis Osteoarthritis  Recheck 1 year or as needed for

## 2012-07-16 NOTE — Patient Instructions (Signed)
Smoking tobacco is very bad for your health. You should stop smoking immediately.    It is important that you exercise regularly, at least 20 minutes 3 to 4 times per week.  If you develop chest pain or shortness of breath seek  medical attention.  Avoids foods high in acid such as tomatoes citrus juices, and spicy foods.  Avoid eating within two hours of lying down or before exercising.  Do not overheat.  Try smaller more frequent meals.  If symptoms persist, elevate the head of her bed 12 inches while sleeping.  Take a calcium supplement, plus [phone] units of vitamin D  Return in one year for follow-up

## 2012-12-12 ENCOUNTER — Other Ambulatory Visit: Payer: Self-pay | Admitting: Internal Medicine

## 2012-12-16 ENCOUNTER — Other Ambulatory Visit: Payer: Self-pay | Admitting: *Deleted

## 2012-12-16 MED ORDER — HYDROCODONE-ACETAMINOPHEN 7.5-325 MG PO TABS: 1.0000 | ORAL_TABLET | Freq: Four times a day (QID) | ORAL | Status: DC | PRN

## 2012-12-16 NOTE — Telephone Encounter (Signed)
Pharmacy called need to change Hydrocodone Rx no longer available 7.5/750 mg. New order given by Sid Falcon,  LPN for Hydrocodone 7.5/325 mg one tablet by mouth every 6 hours as needed, disp # 90, refill 0.

## 2012-12-19 ENCOUNTER — Encounter: Payer: Self-pay | Admitting: Internal Medicine

## 2012-12-19 ENCOUNTER — Ambulatory Visit (INDEPENDENT_AMBULATORY_CARE_PROVIDER_SITE_OTHER): Payer: Managed Care, Other (non HMO) | Admitting: Internal Medicine

## 2012-12-19 VITALS — BP 120/80 | HR 93 | Temp 98.7°F | Resp 20 | Wt 180.0 lb

## 2012-12-19 DIAGNOSIS — F17200 Nicotine dependence, unspecified, uncomplicated: Secondary | ICD-10-CM

## 2012-12-19 DIAGNOSIS — J069 Acute upper respiratory infection, unspecified: Secondary | ICD-10-CM

## 2012-12-19 DIAGNOSIS — J309 Allergic rhinitis, unspecified: Secondary | ICD-10-CM

## 2012-12-19 MED ORDER — HYDROCODONE-ACETAMINOPHEN 7.5-325 MG PO TABS: 1.0000 | ORAL_TABLET | Freq: Four times a day (QID) | ORAL | Status: DC | PRN

## 2012-12-19 NOTE — Progress Notes (Signed)
Subjective:    Patient ID: Lori [name], female    DOB: Jun 10, 1966, 47 y.o.   MRN: [id number]  HPI  47 year old patient who has a history of allergic rhinitis ongoing tobacco use who has been ill for 7 days. 3 days ago she was seen at any urgent care and has been placed on Augmentin guaifenesin for bronchitis. She continues to have significant incest and cough. She has been out of work this week no fever or significant sputum production. Denies any shortness of breath wheezing or chest pain  Past Medical History  Diagnosis Date  . ALLERGIC RHINITIS 03/14/2010  . BACK PAIN 01/24/2010  . GERD 12/01/2008  . HIATAL HERNIA 05/27/2010  . 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 OSTEOARTHRITIS 12/01/2008  . 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 TOBACCO USE 12/01/2008  . VITAMIN D DEFICIENCY 05/23/2010    History   Social History  . Marital Status: Married    Spouse Name: N/A    Number of Children: N/A  . Years of Education: N/A   Occupational History  . Not on file.   Social History Main Topics  . Smoking status: Current Every Day Smoker -- 1.0 packs/day    Types: Cigarettes  . Smokeless tobacco: Never Used  . Alcohol Use: Yes     Comment: rarely  . Drug Use: No  . Sexually Active: Not on file   Other Topics Concern  . Not on file   Social History Narrative  . No narrative on file    Past Surgical History  Procedure Date  . Appendectomy   . Breast surgery     lumpectomy  . Lumbar laminectomy   . Cervical fusion   . Esophagogastroduodenoscopy   . Esophageal manometry     No family history on file.  Allergies  Allergen Reactions  . Morphine     REACTION: hyper    Current Outpatient Prescriptions on File Prior to Visit  Medication Sig Dispense Refill  . diazepam (VALIUM) 5 MG tablet TAKE ONE TABLET BY MOUTH AT BEDTIME AS NEEDED  30 tablet  0  . fluticasone (FLONASE) 50 MCG/ACT nasal spray USE 2 SPRAYS IN NOSE DAILY  16 g  6  . furosemide (LASIX) 20 MG tablet Take 1 tablet  (20 mg total) by mouth daily.  90 tablet  2  . HYDROcodone-acetaminophen (NORCO) 7.5-325 MG per tablet Take 1 tablet by mouth every 6 (six) hours as needed for pain.  90 tablet  0  . meloxicam (MOBIC) 15 MG tablet Take 1 tablet (15 mg total) by mouth daily.  90 tablet  4  . tinidazole (TINDAMAX) 500 MG tablet Take 500 mg by mouth daily with breakfast.        BP 120/80  Pulse 93  Temp 98.7 F (37.1 C) (Oral)  Resp 20  Wt 180 lb (81.647 kg)  SpO2 97%  LMP 11/22/2012       Review of Systems  Constitutional: Negative.   HENT: Positive for congestion, postnasal drip and sinus pressure. Negative for hearing loss, sore throat, rhinorrhea, dental problem and tinnitus.   Eyes: Negative for pain, discharge and visual disturbance.  Respiratory: Positive for cough. Negative for shortness of breath.   Cardiovascular: Negative for chest pain, palpitations and leg swelling.  Gastrointestinal: Negative for nausea, vomiting, abdominal pain, diarrhea, constipation, blood in stool and abdominal distention.  Genitourinary: Negative for dysuria, urgency, frequency, hematuria, flank pain, vaginal bleeding, vaginal discharge, difficulty urinating, vaginal pain and pelvic pain.  Musculoskeletal:  Negative for joint swelling, arthralgias and gait problem.  Skin: Negative for rash.  Neurological: Negative for dizziness, syncope, speech difficulty, weakness, numbness and headaches.  Hematological: Negative for adenopathy.  Psychiatric/Behavioral: Negative for behavioral problems, dysphoric mood and agitation. The patient is not nervous/anxious.        Objective:   Physical Exam  Constitutional: She is oriented to person, place, and time. She appears well-developed and well-nourished. No distress.       Frequent paroxysms of coughing Afebrile O2 saturation 97%  HENT:  Head: Normocephalic.  Right Ear: External ear normal.  Left Ear: External ear normal.  Mouth/Throat: Oropharynx is clear and moist.    Eyes: Conjunctivae normal and EOM are normal. Pupils are equal, round, and reactive to light.  Neck: Normal range of motion. Neck supple. No thyromegaly present.  Cardiovascular: Normal rate, regular rhythm, normal heart sounds and intact distal pulses.   Pulmonary/Chest: Effort normal and breath sounds normal.  Abdominal: Soft. Bowel sounds are normal. She exhibits no mass. There is no tenderness.  Musculoskeletal: Normal range of motion.  Lymphadenopathy:    She has no cervical adenopathy.  Neurological: She is alert and oriented to person, place, and time.  Skin: Skin is warm and dry. No rash noted.  Psychiatric: She has a normal mood and affect. Her behavior is normal.          Assessment & Plan:   Viral URI with cough. We'll continue symptomatic treatment We'll give a letter to be out of work until January 13 Cessation of smoking encouraged

## 2012-12-19 NOTE — Patient Instructions (Signed)
Use saline irrigation, warm  moist compresses and over-the-counter decongestants only as directed.  Call if there is no improvement in 5 to 7 days, or sooner if you develop increasing pain, fever, or any new symptoms. 

## 2012-12-23 ENCOUNTER — Telehealth: Payer: Self-pay | Admitting: Internal Medicine

## 2012-12-23 NOTE — Telephone Encounter (Signed)
Pt was seen in the office for cough and URI.  Pt completed antibiotic and is still coughing.  Pt states she had a refill of Metronidazole 500mg  1 PO BID x 7 days, so she has filled that medication.  Pt wants to know if this is ok to take for her symptoms.  RN unable to answer question for patient regarding antibiotic.  OFFICE PLEASE FOLLOW UP WITH PT REGARDING ANTIBIOTIC, IF THIS WILL BE OK TO TAKE FOR URI/COUGH SYMPTOMS OR IF NEW MEDICATION SHOULD BE CALLED IN.

## 2012-12-24 NOTE — Telephone Encounter (Signed)
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 Gave pt message Metronidazole is not appropriate medication and do not recommend using. Continue cough medications and cessation of tobacco use. Pt verbalized understanding. New return to work note given.

## 2012-12-24 NOTE — Telephone Encounter (Signed)
Metronidazole is not an appropriate medication for bronchitis and did not recommend using. Patient has completed Augmentin. Continue other cough medications and encourage total cessation of tobacco use

## 2013-01-16 ENCOUNTER — Telehealth: Payer: Self-pay | Admitting: *Deleted

## 2013-01-16 NOTE — Telephone Encounter (Signed)
Left message on voicemail. That forms for FMLA will have to be filled out by Dr. Amador Cunas when he gets back on 2/11.

## 2013-01-20 DIAGNOSIS — Z0279 Encounter for issue of other medical certificate: Secondary | ICD-10-CM

## 2013-04-17 ENCOUNTER — Other Ambulatory Visit: Payer: Self-pay | Admitting: Obstetrics and Gynecology

## 2013-04-21 ENCOUNTER — Other Ambulatory Visit: Payer: Self-pay | Admitting: Obstetrics and Gynecology

## 2013-04-21 DIAGNOSIS — R928 Other abnormal and inconclusive findings on diagnostic imaging of breast: Secondary | ICD-10-CM

## 2013-04-22 ENCOUNTER — Other Ambulatory Visit: Payer: Self-pay | Admitting: Internal Medicine

## 2013-05-13 ENCOUNTER — Other Ambulatory Visit: Payer: Managed Care, Other (non HMO)

## 2013-05-26 ENCOUNTER — Ambulatory Visit
Admission: RE | Admit: 2013-05-26 | Discharge: 2013-05-26 | Disposition: A | Discharge status: Home / Self Care | Payer: Managed Care, Other (non HMO) | Source: Ambulatory Visit | Attending: Obstetrics and Gynecology | Admitting: Obstetrics and Gynecology

## 2013-05-26 DIAGNOSIS — R928 Other abnormal and inconclusive findings on diagnostic imaging of breast: Secondary | ICD-10-CM

## 2013-07-10 ENCOUNTER — Other Ambulatory Visit: Payer: Self-pay | Admitting: Internal Medicine

## 2013-07-21 ENCOUNTER — Other Ambulatory Visit: Payer: Self-pay | Admitting: Internal Medicine

## 2013-09-08 ENCOUNTER — Other Ambulatory Visit: Payer: Self-pay | Admitting: Internal Medicine

## 2013-10-01 ENCOUNTER — Telehealth: Payer: Self-pay | Admitting: Internal Medicine

## 2013-10-01 NOTE — Telephone Encounter (Signed)
Yes, that is fine. 

## 2013-10-01 NOTE — Telephone Encounter (Signed)
Pt needs cpx before 10-14-13. Can I create 30 min slot? Pt last cpx 07-2012

## 2013-10-01 NOTE — Telephone Encounter (Signed)
lmom for pt to call back

## 2013-10-07 NOTE — Telephone Encounter (Signed)
lmom at cell/hm # for pt to callback

## 2013-10-09 MED ORDER — HYDROCODONE-ACETAMINOPHEN 7.5-325 MG PO TABS: ORAL_TABLET | ORAL | Status: DC

## 2013-10-09 NOTE — Telephone Encounter (Signed)
Pt needs new rx hydrocodone. Pt has sch her cpx for 12-23-2013

## 2013-10-09 NOTE — Telephone Encounter (Signed)
Pt notified Rx ready for pick up will be at the front desk. Rx printed and signed, put at front desk.

## 2013-10-19 ENCOUNTER — Other Ambulatory Visit: Payer: Self-pay | Admitting: Internal Medicine

## 2013-11-13 ENCOUNTER — Telehealth: Payer: Self-pay | Admitting: Internal Medicine

## 2013-11-13 MED ORDER — HYDROCODONE-ACETAMINOPHEN 7.5-325 MG PO TABS: ORAL_TABLET | ORAL | Status: DC

## 2013-11-13 NOTE — Telephone Encounter (Signed)
Pt notified Rx ready for pickup. Rx printed and signed, put at front desk. 

## 2013-11-13 NOTE — Telephone Encounter (Signed)
Pt request refill HYDROcodone-acetaminophen (NORCO) 7.5-325 MG per tablet °

## 2013-12-16 ENCOUNTER — Other Ambulatory Visit (INDEPENDENT_AMBULATORY_CARE_PROVIDER_SITE_OTHER): Payer: 59

## 2013-12-16 DIAGNOSIS — Z0000 Encounter for general adult medical examination without abnormal findings: Secondary | ICD-10-CM

## 2013-12-16 DIAGNOSIS — E559 Vitamin D deficiency, unspecified: Secondary | ICD-10-CM

## 2013-12-16 LAB — BASIC METABOLIC PANEL
BUN: 13 mg/dL (ref 6–23)
CO2: 26 mEq/L (ref 19–32)
Calcium: 9.2 mg/dL (ref 8.4–10.5)
Chloride: 102 mEq/L (ref 96–112)
Creatinine, Ser: 0.8 mg/dL (ref 0.4–1.2)
GFR: 79.93 mL/min (ref >60.00)
Glucose, Bld: 86 mg/dL (ref 70–99)
Potassium: 3.7 mEq/L (ref 3.5–5.1)
Sodium: 137 mEq/L (ref 135–145)

## 2013-12-16 LAB — CBC WITH DIFFERENTIAL/PLATELET
Basophils Absolute: 0 10*3/uL (ref 0.0–0.1)
Basophils Relative: 0.4 % (ref 0.0–3.0)
Eosinophils Absolute: 0.2 10*3/uL (ref 0.0–0.7)
Eosinophils Relative: 2 % (ref 0.0–5.0)
HCT: 39.2 % (ref 36.0–46.0)
Hemoglobin: 13.4 g/dL (ref 12.0–15.0)
Lymphocytes Relative: 34 % (ref 12.0–46.0)
Lymphs Abs: 3.6 10*3/uL (ref 0.7–4.0)
MCHC: 34.2 g/dL (ref 30.0–36.0)
MCV: 93.4 fl (ref 78.0–100.0)
Monocytes Absolute: 0.6 10*3/uL (ref 0.1–1.0)
Monocytes Relative: 5.8 % (ref 3.0–12.0)
Neutro Abs: 6.2 10*3/uL (ref 1.4–7.7)
Neutrophils Relative %: 57.8 % (ref 43.0–77.0)
Platelets: 220 10*3/uL (ref 150.0–400.0)
RBC: 4.19 Mil/uL (ref 3.87–5.11)
RDW: 13.8 % (ref 11.5–14.6)
WBC: 10.7 10*3/uL — ABNORMAL HIGH (ref 4.5–10.5)

## 2013-12-16 LAB — HEPATIC FUNCTION PANEL
ALT: 23 U/L (ref 0–35)
AST: 23 U/L (ref 0–37)
Albumin: 4.4 g/dL (ref 3.5–5.2)
Alkaline Phosphatase: 61 U/L (ref 39–117)
Bilirubin, Direct: 0.1 mg/dL (ref 0.0–0.3)
Total Bilirubin: 0.5 mg/dL (ref 0.3–1.2)
Total Protein: 7.1 g/dL (ref 6.0–8.3)

## 2013-12-16 LAB — POCT URINALYSIS DIPSTICK
Bilirubin, UA: NEGATIVE
Glucose, UA: NEGATIVE
Ketones, UA: NEGATIVE
Leukocytes, UA: NEGATIVE
Nitrite, UA: NEGATIVE
Protein, UA: NEGATIVE
Spec Grav, UA: >=1.03
Urobilinogen, UA: 0.2
pH, UA: 5.5

## 2013-12-16 LAB — TSH: TSH: 0.99 u[IU]/mL (ref 0.35–5.50)

## 2013-12-16 LAB — LIPID PANEL
Cholesterol: 177 mg/dL (ref 0–200)
HDL: 42.1 mg/dL (ref >39.00)
LDL Cholesterol: 118 mg/dL — ABNORMAL HIGH (ref 0–99)
Total CHOL/HDL Ratio: 4
Triglycerides: 83 mg/dL (ref 0.0–149.0)
VLDL: 16.6 mg/dL (ref 0.0–40.0)

## 2013-12-17 LAB — VITAMIN D 25 HYDROXY (VIT D DEFICIENCY, FRACTURES): Vit D, 25-Hydroxy: 26 ng/mL — ABNORMAL LOW (ref 30–89)

## 2013-12-23 ENCOUNTER — Ambulatory Visit (INDEPENDENT_AMBULATORY_CARE_PROVIDER_SITE_OTHER): Payer: 59 | Admitting: Internal Medicine

## 2013-12-23 ENCOUNTER — Encounter: Payer: Self-pay | Admitting: Internal Medicine

## 2013-12-23 VITALS — BP 150/86 | HR 97 | Temp 98.8°F | Resp 20 | Ht 65.0 in | Wt 193.0 lb

## 2013-12-23 DIAGNOSIS — M549 Dorsalgia, unspecified: Secondary | ICD-10-CM

## 2013-12-23 DIAGNOSIS — F17200 Nicotine dependence, unspecified, uncomplicated: Secondary | ICD-10-CM

## 2013-12-23 DIAGNOSIS — Z23 Encounter for immunization: Secondary | ICD-10-CM

## 2013-12-23 DIAGNOSIS — K219 Gastro-esophageal reflux disease without esophagitis: Secondary | ICD-10-CM

## 2013-12-23 DIAGNOSIS — Z0000 Encounter for general adult medical examination without abnormal findings: Secondary | ICD-10-CM

## 2013-12-23 DIAGNOSIS — J309 Allergic rhinitis, unspecified: Secondary | ICD-10-CM

## 2013-12-23 DIAGNOSIS — M1990 Unspecified osteoarthritis, unspecified site: Secondary | ICD-10-CM

## 2013-12-23 MED ORDER — DIAZEPAM 5 MG PO TABS: ORAL_TABLET | ORAL | Status: DC

## 2013-12-23 MED ORDER — HYDROCODONE-ACETAMINOPHEN 7.5-325 MG PO TABS: ORAL_TABLET | ORAL | Status: DC

## 2013-12-23 NOTE — Progress Notes (Signed)
Subjective:    Patient ID: Lori [name], female    DOB: 10-Mar-1966, 48 y.o.   MRN: [id number]  HPI 48 -year-old patient who is seen today for followup and preventive health examination. She's had a recent gynecologic evaluation. She has been followed by orthopedics GYN as well as gastroenterology. Her reflux symptoms have been stable. She does have a history of vitamin D deficiency. Laboratory studies were reviewed.  BP Readings from Last 3 Encounters:  12/23/13 150/86  12/19/12 120/80  07/16/12 122/74    Past Medical History  Diagnosis Date  . ALLERGIC RHINITIS 03/14/2010  . BACK PAIN 01/24/2010  . GERD 12/01/2008  . HIATAL HERNIA 05/27/2010  . 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 OSTEOARTHRITIS 12/01/2008  . 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 TOBACCO USE 12/01/2008  . VITAMIN D DEFICIENCY 05/23/2010    History   Social History  . Marital Status: Married    Spouse Name: N/A    Number of Children: N/A  . Years of Education: N/A   Occupational History  . Not on file.   Social History Main Topics  . Smoking status: Current Every Day Smoker -- 1.00 packs/day    Types: Cigarettes  . Smokeless tobacco: Never Used  . Alcohol Use: Yes     Comment: rarely  . Drug Use: No  . Sexual Activity: Not on file   Other Topics Concern  . Not on file   Social History Narrative  . No narrative on file    Past Surgical History  Procedure Laterality Date  . Appendectomy    . Breast surgery      lumpectomy  . Lumbar laminectomy    . Cervical fusion    . Esophagogastroduodenoscopy    . Esophageal manometry      History reviewed. No pertinent family history.  Allergies  Allergen Reactions  . Morphine     REACTION: hyper    Current Outpatient Prescriptions on File Prior to Visit  Medication Sig Dispense Refill  . diazepam (VALIUM) 5 MG tablet TAKE ONE TABLET BY MOUTH AT BEDTIME AS NEEDED  30 tablet  0  . fluticasone (FLONASE) 50 MCG/ACT nasal spray USE 2 SPRAYS IN NOSE DAILY   16 g  6  . HYDROcodone-acetaminophen (NORCO) 7.5-325 MG per tablet TAKE ONE TABLET BY MOUTH EVERY 6 HOURS AS NEEDED FOR PAIN  90 tablet  0   No current facility-administered medications on file prior to visit.    BP 150/86  Pulse 97  Temp(Src) 98.8 F (37.1 C) (Oral)  Resp 20  Ht 5\' 5"  (1.651 m)  Wt 193 lb (87.544 kg)  BMI 32.12 kg/m2  SpO2 98%  LMP 49/03/2014          Review of Systems  Constitutional: Negative for fever, appetite change, fatigue and unexpected weight change.  HENT: Negative for congestion, dental problem, ear pain, hearing loss, mouth sores, nosebleeds, sinus pressure, sore throat, tinnitus, trouble swallowing and voice change.   Eyes: Negative for photophobia, pain, redness and visual disturbance.  Respiratory: Negative for cough, chest tightness and shortness of breath.   Cardiovascular: Negative for chest pain, palpitations and leg swelling.  Gastrointestinal: Negative for nausea, vomiting, abdominal pain, diarrhea, constipation, blood in stool, abdominal distention and rectal pain.  Genitourinary: Negative for dysuria, urgency, frequency, hematuria, flank pain, vaginal bleeding, vaginal discharge, difficulty urinating, genital sores, vaginal pain, menstrual problem and pelvic pain.  Musculoskeletal: Positive for arthralgias and back pain. Negative for neck stiffness.  Skin: Negative for  rash.  Neurological: Negative for dizziness, syncope, speech difficulty, weakness, light-headedness, numbness and headaches.  Hematological: Negative for adenopathy. Does not bruise/bleed easily.  Psychiatric/Behavioral: Negative for suicidal ideas, behavioral problems, self-injury, dysphoric mood and agitation. The patient is not nervous/anxious.        Objective:   Physical Exam  Constitutional: She is oriented to person, place, and time. She appears well-developed and well-nourished.  HENT:  Head: Normocephalic and atraumatic.  Right Ear: External ear normal.   Left Ear: External ear normal.  Mouth/Throat: Oropharynx is clear and moist.  Eyes: Conjunctivae and EOM are normal.  Neck: Normal range of motion. Neck supple. No JVD present. No thyromegaly present.  Cardiovascular: Normal rate, regular rhythm, normal heart sounds and intact distal pulses.   No murmur heard. Pulmonary/Chest: Effort normal and breath sounds normal. She has no wheezes. She has no rales.  Abdominal: Soft. Bowel sounds are normal. She exhibits no distension and no mass. There is no tenderness. There is no rebound and no guarding.  Musculoskeletal: Normal range of motion. She exhibits no edema and no tenderness.  Neurological: She is alert and oriented to person, place, and time. She has normal reflexes. No cranial nerve deficit. She exhibits normal muscle tone. Coordination normal.  Skin: Skin is warm and dry. No rash noted.  Psychiatric: She has a normal mood and affect. Her behavior is normal.          Assessment & Plan:   Preventive health examination. Tobacco use discussed Vitamin D deficiency. We'll continue supplements Allergic rhinitis Osteoarthritis  Recheck 1 year or as needed

## 2013-12-23 NOTE — Progress Notes (Signed)
Pre-visit discussion using our clinic review tool. No additional management support is needed unless otherwise documented below in the visit note.  

## 2013-12-23 NOTE — Patient Instructions (Signed)
Limit your sodium (Salt) intake    It is important that you exercise regularly, at least 20 minutes 3 to 4 times per week.  If you develop chest pain or shortness of breath seek  medical attention.  You need to lose weight.  Consider a lower calorie diet and regular exercise.  Avoids foods high in acid such as tomatoes citrus juices, and spicy foods.  Avoid eating within two hours of lying down or before exercising.  Do not overheat.  Try smaller more frequent meals.  If symptoms persist, elevate the head of her bed 12 inches while sleeping.  Take a calcium supplement, plus [phone] units of vitamin D

## 2014-01-10 ENCOUNTER — Telehealth: Payer: Self-pay | Admitting: Internal Medicine

## 2014-01-10 NOTE — Telephone Encounter (Signed)
Relevant patient education mailed to patient.  

## 2014-02-02 ENCOUNTER — Telehealth: Payer: Self-pay | Admitting: Internal Medicine

## 2014-02-02 MED ORDER — HYDROCODONE-ACETAMINOPHEN 7.5-325 MG PO TABS: ORAL_TABLET | ORAL | Status: DC

## 2014-02-02 NOTE — Telephone Encounter (Signed)
Pt is requesting new rx HYDROcodone-acetaminophen (NORCO) 7.5-325 MG per tablet, please call and advise when available for pick up.

## 2014-02-02 NOTE — Telephone Encounter (Signed)
Pt notified Rx ready for pickup, will be at the front desk. Rx printed and signed. 

## 2014-04-23 ENCOUNTER — Telehealth: Payer: Self-pay | Admitting: Internal Medicine

## 2014-04-23 MED ORDER — HYDROCODONE-ACETAMINOPHEN 7.5-325 MG PO TABS: ORAL_TABLET | ORAL | Status: DC

## 2014-04-23 NOTE — Telephone Encounter (Signed)
Left detailed message Rx ready for pickup. Rx printed and signed. 

## 2014-04-23 NOTE — Telephone Encounter (Signed)
Pt req rx on HYDROcodone-acetaminophen (NORCO) 7.5-325 MG per tablet ° °

## 2014-07-08 ENCOUNTER — Telehealth: Payer: Self-pay | Admitting: Internal Medicine

## 2014-07-08 MED ORDER — HYDROCODONE-ACETAMINOPHEN 7.5-325 MG PO TABS: ORAL_TABLET | ORAL | Status: DC

## 2014-07-08 NOTE — Telephone Encounter (Signed)
Pt request refill of the following: HYDROcodone-acetaminophen (NORCO) 7.5-325 MG per tablet ° ° °Phamacy: ° °

## 2014-07-08 NOTE — Telephone Encounter (Signed)
Left detailed message on personal voicemail Rx ready for pickup. Rx printed and signed by Dr. Elease Hashimoto.

## 2014-07-30 ENCOUNTER — Other Ambulatory Visit: Payer: Self-pay | Admitting: Internal Medicine

## 2014-08-06 ENCOUNTER — Telehealth: Payer: Self-pay | Admitting: Internal Medicine

## 2014-08-06 NOTE — Telephone Encounter (Signed)
Pt is needing new rx HYDROcodone-acetaminophen (NORCO) 7.5-325 MG per tablet, and diazepam (valium) 5 mg, please call when available for pick up.

## 2014-08-07 MED ORDER — HYDROCODONE-ACETAMINOPHEN 7.5-325 MG PO TABS: ORAL_TABLET | ORAL | Status: DC

## 2014-08-07 MED ORDER — DIAZEPAM 5 MG PO TABS: ORAL_TABLET | ORAL | Status: DC

## 2014-08-07 NOTE — Telephone Encounter (Signed)
Left detailed message Rx ready for pickup. Rx printed and signed. 

## 2014-09-03 ENCOUNTER — Telehealth: Payer: Self-pay | Admitting: Internal Medicine

## 2014-09-03 NOTE — Telephone Encounter (Signed)
Pt needs new rx hydrocodone °

## 2014-09-04 MED ORDER — HYDROCODONE-ACETAMINOPHEN 7.5-325 MG PO TABS: ORAL_TABLET | ORAL | Status: DC

## 2014-09-04 NOTE — Telephone Encounter (Signed)
Left detailed message Rx ready for pickup. Rx printed and signed. 

## 2014-09-28 ENCOUNTER — Telehealth: Payer: Self-pay | Admitting: Internal Medicine

## 2014-09-28 NOTE — Telephone Encounter (Signed)
Pt request refill of the following: HYDROcodone-acetaminophen (NORCO) 7.5-325 MG per tablet ° ° ° °Phamacy: °   Pick up  °

## 2014-10-01 MED ORDER — HYDROCODONE-ACETAMINOPHEN 7.5-325 MG PO TABS: ORAL_TABLET | ORAL | Status: DC

## 2014-10-01 NOTE — Telephone Encounter (Signed)
Pt is aware rx is ready for pick up. 

## 2014-10-01 NOTE — Telephone Encounter (Signed)
Rx printed and signed, put at front desk.

## 2014-10-14 ENCOUNTER — Ambulatory Visit (INDEPENDENT_AMBULATORY_CARE_PROVIDER_SITE_OTHER): Payer: 59 | Admitting: Internal Medicine

## 2014-10-14 ENCOUNTER — Encounter: Payer: Self-pay | Admitting: Internal Medicine

## 2014-10-14 VITALS — BP 126/80 | HR 81 | Temp 98.3°F | Resp 20 | Ht 65.0 in | Wt 193.0 lb

## 2014-10-14 DIAGNOSIS — M8949 Other hypertrophic osteoarthropathy, multiple sites: Secondary | ICD-10-CM

## 2014-10-14 DIAGNOSIS — M159 Polyosteoarthritis, unspecified: Secondary | ICD-10-CM

## 2014-10-14 DIAGNOSIS — B9789 Other viral agents as the cause of diseases classified elsewhere: Secondary | ICD-10-CM

## 2014-10-14 DIAGNOSIS — M150 Primary generalized (osteo)arthritis: Secondary | ICD-10-CM

## 2014-10-14 DIAGNOSIS — M549 Dorsalgia, unspecified: Secondary | ICD-10-CM

## 2014-10-14 DIAGNOSIS — J309 Allergic rhinitis, unspecified: Secondary | ICD-10-CM

## 2014-10-14 DIAGNOSIS — J069 Acute upper respiratory infection, unspecified: Secondary | ICD-10-CM

## 2014-10-14 DIAGNOSIS — Z720 Tobacco use: Secondary | ICD-10-CM

## 2014-10-14 DIAGNOSIS — F17200 Nicotine dependence, unspecified, uncomplicated: Secondary | ICD-10-CM

## 2014-10-14 MED ORDER — BENZONATATE 200 MG PO CAPS: 200.0000 mg | ORAL_CAPSULE | Freq: Two times a day (BID) | ORAL | Status: DC | PRN

## 2014-10-14 NOTE — Patient Instructions (Signed)
Acute bronchitis symptoms  are generally not helped by antibiotics.  Take over-the-counter expectorants and cough medications such as  Mucinex DM.  Call if there is no improvement in 5 to 7 days or if  you develop worsening cough, fever, or new symptoms, such as shortness of breath or chest pain.  Smoking tobacco is very bad for your health. You should stop smoking immediately.

## 2014-10-14 NOTE — Progress Notes (Signed)
Subjective:    Patient ID: Lori [name], female    DOB: Apr 22, 1966, 48 y.o.   MRN: [id number]  HPI  48 year old patient who was seen infrequently with a history of chronic low back pain.  She has had surgery for both cervical and lumbar disc disease.  She remains on hydrocodone 4 times daily. She has a history of ongoing tobacco use.  She presented with a two-week history of cough and congestion.  She was seen at an urgent care approximately 2 weeks ago and was treated with amoxicillin.  This she tolerated poorly with diarrhea, epigastric pain and a vaginal yeast infection.  At the present time, her chief complaint is nonproductive cough.  She has also been using Claritin  Past Medical History  Diagnosis Date  . ALLERGIC RHINITIS 03/14/2010  . BACK PAIN 01/24/2010  . GERD 12/01/2008  . HIATAL HERNIA 05/27/2010  . 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 OSTEOARTHRITIS 12/01/2008  . 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 TOBACCO USE 12/01/2008  . VITAMIN D DEFICIENCY 05/23/2010    History   Social History  . Marital Status: Married    Spouse Name: N/A    Number of Children: N/A  . Years of Education: N/A   Occupational History  . Not on file.   Social History Main Topics  . Smoking status: Current Every Day Smoker -- 1.00 packs/day    Types: Cigarettes  . Smokeless tobacco: Never Used  . Alcohol Use: Yes     Comment: rarely  . Drug Use: No  . Sexual Activity: Not on file   Other Topics Concern  . Not on file   Social History Narrative    Past Surgical History  Procedure Laterality Date  . Appendectomy    . Breast surgery      lumpectomy  . Lumbar laminectomy    . Cervical fusion    . Esophagogastroduodenoscopy    . Esophageal manometry    . Laparoscopic nissen fundoplication      No family history on file.  Allergies  Allergen Reactions  . Amoxicillin Nausea Only    Yeast Infection  . Morphine     REACTION: hyper    Current Outpatient Prescriptions on File  Prior to Visit  Medication Sig Dispense Refill  . Cholecalciferol (VITAMIN D-3) 1000 UNITS CAPS Take 1 capsule by mouth daily.    . diazepam (VALIUM) 5 MG tablet TAKE ONE TABLET BY MOUTH AT BEDTIME AS NEEDED 60 tablet 5  . fluticasone (FLONASE) 50 MCG/ACT nasal spray USE 2 SPRAYS IN NOSE DAILY 16 g 6  . furosemide (LASIX) 20 MG tablet TAKE 1 TABLET BY MOUTH DAILY 90 tablet 0  . HYDROcodone-acetaminophen (NORCO) 7.5-325 MG per tablet TAKE ONE TABLET BY MOUTH EVERY 6 HOURS AS NEEDED FOR PAIN 90 tablet 0   No current facility-administered medications on file prior to visit.    BP 126/80 mmHg  Pulse 81  Temp(Src) 98.3 F (36.8 C) (Oral)  Resp 20  Ht 5\' 5"  (1.651 m)  Wt 193 lb (87.544 kg)  BMI 32.12 kg/m2  SpO2 98%  LMP 10/12/2014     Review of Systems  Constitutional: Positive for activity change, appetite change and fatigue.  HENT: Positive for congestion. Negative for dental problem, hearing loss, rhinorrhea, sinus pressure, sore throat and tinnitus.   Eyes: Negative for pain, discharge and visual disturbance.  Respiratory: Positive for cough. Negative for shortness of breath.   Cardiovascular: Negative for chest pain, palpitations and leg swelling.  Gastrointestinal: Negative for nausea, vomiting, abdominal pain, diarrhea, constipation, blood in stool and abdominal distention.  Genitourinary: Negative for dysuria, urgency, frequency, hematuria, flank pain, vaginal bleeding, vaginal discharge, difficulty urinating, vaginal pain and pelvic pain.  Musculoskeletal: Positive for back pain. Negative for joint swelling, arthralgias and gait problem.  Skin: Negative for rash.  Neurological: Negative for dizziness, syncope, speech difficulty, weakness, numbness and headaches.  Hematological: Negative for adenopathy.  Psychiatric/Behavioral: Negative for behavioral problems, dysphoric mood and agitation. The patient is not nervous/anxious.        Objective:   Physical Exam    Constitutional: She is oriented to person, place, and time. She appears well-developed and well-nourished.  Blood pressure well controlled No distress Frequent paroxysms of coughing  HENT:  Head: Normocephalic.  Right Ear: External ear normal.  Left Ear: External ear normal.  Mouth/Throat: Oropharynx is clear and moist.  Eyes: Conjunctivae and EOM are normal. Pupils are equal, round, and reactive to light.  Neck: Normal range of motion. Neck supple. No thyromegaly present.  Cardiovascular: Normal rate, regular rhythm, normal heart sounds and intact distal pulses.   Pulmonary/Chest: Effort normal and breath sounds normal. No respiratory distress. She has no wheezes. She has no rales.  Abdominal: Soft. Bowel sounds are normal. She exhibits no mass. There is no tenderness.  Musculoskeletal: Normal range of motion.  Lymphadenopathy:    She has no cervical adenopathy.  Neurological: She is alert and oriented to person, place, and time.  Skin: Skin is warm and dry. No rash noted.  Psychiatric: She has a normal mood and affect. Her behavior is normal.          Assessment & Plan:   Viral URI with cough.  Will treat symptomatically Chronic low back pain Allergic rhinitis History of ongoing tobacco use.  Total smoking cessation encouraged  Preventative health exam next year as scheduled

## 2014-10-16 ENCOUNTER — Telehealth: Payer: Self-pay | Admitting: Internal Medicine

## 2014-10-16 NOTE — Telephone Encounter (Signed)
Dr. Yong Channel will you be willing to do this for Dr. Raliegh Ip in his absence?

## 2014-10-16 NOTE — Telephone Encounter (Signed)
Can you please call pt since this is more clinical and I can't answer them??

## 2014-10-16 NOTE — Telephone Encounter (Signed)
Yes ma'm :)

## 2014-10-16 NOTE — Telephone Encounter (Signed)
Lori [name] can you please call pt and inform per Dr. Yong Channel please?

## 2014-10-16 NOTE — Telephone Encounter (Signed)
Pt states she saw Dr. Raliegh Ip on 09/13/14 and was advised that if she wasn't feeling any better to call back for RX.  Pt said her cough is getting worse and she would like a RX called into [facility] in [place]. Please advise.

## 2014-10-16 NOTE — Telephone Encounter (Signed)
Patient seen on 11/4 and advised "Call if there is no improvement in 5 to 7 days or if you develop worsening cough, fever, or new symptoms, such as shortness of breath or chest pain."  It has only been 2 days. She should call back Monday.

## 2014-10-20 ENCOUNTER — Telehealth: Payer: Self-pay | Admitting: Internal Medicine

## 2014-10-20 MED ORDER — AZITHROMYCIN 250 MG PO TABS: ORAL_TABLET | ORAL | Status: DC

## 2014-10-20 NOTE — Telephone Encounter (Signed)
Please advise 

## 2014-10-20 NOTE — Telephone Encounter (Signed)
Left detailed message Rx requested was sent to pharmacy.

## 2014-10-20 NOTE — Telephone Encounter (Signed)
Pt was seen on 11-4 and now requesting zpak call into [facility] [place]. Pt has cough and chest congestion

## 2014-10-20 NOTE — Telephone Encounter (Signed)
ok 

## 2014-10-23 ENCOUNTER — Telehealth: Payer: Self-pay | Admitting: Internal Medicine

## 2014-10-23 NOTE — Telephone Encounter (Addendum)
Pt request refill HYDROcodone-acetaminophen (NORCO) 7.5-325 MG per tablet Last refill 10/22.  Pt states she is working 6 days a wk. 12 hrs a day and needs to take 4/day.    Pt aware dr Raliegh Ip out until 11/17

## 2014-10-28 MED ORDER — HYDROCODONE-ACETAMINOPHEN 7.5-325 MG PO TABS: ORAL_TABLET | ORAL | Status: DC

## 2014-10-28 NOTE — Telephone Encounter (Signed)
Left detailed message Rx ready for pickup. Rx printed and signed. 

## 2014-11-04 ENCOUNTER — Other Ambulatory Visit: Payer: Self-pay | Admitting: Internal Medicine

## 2014-11-26 ENCOUNTER — Telehealth: Payer: Self-pay | Admitting: Internal Medicine

## 2014-11-26 MED ORDER — HYDROCODONE-ACETAMINOPHEN 7.5-325 MG PO TABS: ORAL_TABLET | ORAL | Status: DC

## 2014-11-26 NOTE — Telephone Encounter (Addendum)
Pt needs new rx hydrocodone. Pt has about 6 pills left. Pt is aware md out of office

## 2014-11-26 NOTE — Telephone Encounter (Signed)
Pt notified Rx ready for pickup. Rx printed and signed by Dr. Todd.  

## 2014-12-25 ENCOUNTER — Telehealth: Payer: Self-pay | Admitting: Internal Medicine

## 2014-12-25 MED ORDER — HYDROCODONE-ACETAMINOPHEN 7.5-325 MG PO TABS: ORAL_TABLET | ORAL | Status: DC

## 2014-12-25 NOTE — Telephone Encounter (Signed)
Pt request refill of the following: HYDROcodone-acetaminophen (NORCO) 7.5-325 MG per tablet ° ° °Phamacy: ° °

## 2014-12-25 NOTE — Telephone Encounter (Signed)
Left detailed message Rx ready for pickup, will be at the front desk. Rx printed and signed. 

## 2015-01-18 ENCOUNTER — Telehealth: Payer: Self-pay | Admitting: Internal Medicine

## 2015-01-18 NOTE — Telephone Encounter (Signed)
Pt request refill of the following: HYDROcodone-acetaminophen (NORCO) 7.5-325 MG per tablet ° ° °Phamacy: ° °

## 2015-01-21 NOTE — Telephone Encounter (Signed)
Pt following up on refill request. pls advise

## 2015-01-22 MED ORDER — HYDROCODONE-ACETAMINOPHEN 7.5-325 MG PO TABS: ORAL_TABLET | ORAL | Status: DC

## 2015-01-22 NOTE — Telephone Encounter (Signed)
Left detailed message on personal voicemail Rx ready for pickup. Rx printed and signed.  

## 2015-02-05 ENCOUNTER — Other Ambulatory Visit: Payer: Self-pay | Admitting: Internal Medicine

## 2015-02-17 ENCOUNTER — Telehealth: Payer: Self-pay | Admitting: Internal Medicine

## 2015-02-17 NOTE — Telephone Encounter (Signed)
Pt request refill of the following: HYDROcodone-acetaminophen (NORCO) 7.5-325 MG per tablet ° ° °Phamacy: ° °

## 2015-02-19 MED ORDER — HYDROCODONE-ACETAMINOPHEN 7.5-325 MG PO TABS: ORAL_TABLET | ORAL | Status: DC

## 2015-02-19 NOTE — Telephone Encounter (Signed)
Left detailed message Rx ready for pickup. Rx printed and signed. 

## 2015-03-19 ENCOUNTER — Telehealth: Payer: Self-pay | Admitting: Internal Medicine

## 2015-03-19 MED ORDER — HYDROCODONE-ACETAMINOPHEN 7.5-325 MG PO TABS: ORAL_TABLET | ORAL | Status: DC

## 2015-03-19 NOTE — Telephone Encounter (Signed)
Pt here for Rx. Rx printed and signed by Dr. Elease Hashimoto.

## 2015-03-19 NOTE — Telephone Encounter (Signed)
Pt needs new rx hydrocodone °

## 2015-04-07 ENCOUNTER — Other Ambulatory Visit: Payer: Self-pay | Admitting: Internal Medicine

## 2015-04-16 ENCOUNTER — Telehealth: Payer: Self-pay | Admitting: Internal Medicine

## 2015-04-16 MED ORDER — HYDROCODONE-ACETAMINOPHEN 7.5-325 MG PO TABS
ORAL_TABLET | ORAL | Status: DC
Start: 2015-04-16 — End: 2015-05-14

## 2015-04-16 NOTE — Telephone Encounter (Signed)
ok 

## 2015-04-16 NOTE — Telephone Encounter (Signed)
Left detailed message Rx ready for pickup. Rx printed and signed. 

## 2015-04-16 NOTE — Telephone Encounter (Signed)
Pt needs new rx hydrocodone. Pt take 4 pills a day. Pt needs #120 for 30 day supply Pt is sch for cpx in June.

## 2015-04-16 NOTE — Telephone Encounter (Signed)
Please see message and advise if okay to increase quantity to 120 per month?

## 2015-05-07 ENCOUNTER — Other Ambulatory Visit (INDEPENDENT_AMBULATORY_CARE_PROVIDER_SITE_OTHER): Payer: 59

## 2015-05-07 DIAGNOSIS — Z0000 Encounter for general adult medical examination without abnormal findings: Secondary | ICD-10-CM | POA: Diagnosis not present

## 2015-05-07 DIAGNOSIS — E559 Vitamin D deficiency, unspecified: Secondary | ICD-10-CM | POA: Diagnosis not present

## 2015-05-07 LAB — CBC WITH DIFFERENTIAL/PLATELET
Basophils Absolute: 0 10*3/uL (ref 0.0–0.1)
Basophils Relative: 0.4 % (ref 0.0–3.0)
Eosinophils Absolute: 0.2 10*3/uL (ref 0.0–0.7)
Eosinophils Relative: 1.6 % (ref 0.0–5.0)
HCT: 42.9 % (ref 36.0–46.0)
Hemoglobin: 14.6 g/dL (ref 12.0–15.0)
Lymphocytes Relative: 32.1 % (ref 12.0–46.0)
Lymphs Abs: 3.2 10*3/uL (ref 0.7–4.0)
MCHC: 34.2 g/dL (ref 30.0–36.0)
MCV: 93.8 fl (ref 78.0–100.0)
Monocytes Absolute: 0.4 10*3/uL (ref 0.1–1.0)
Monocytes Relative: 4.6 % (ref 3.0–12.0)
Neutro Abs: 6 10*3/uL (ref 1.4–7.7)
Neutrophils Relative %: 61.3 % (ref 43.0–77.0)
Platelets: 221 10*3/uL (ref 150.0–400.0)
RBC: 4.57 Mil/uL (ref 3.87–5.11)
RDW: 13.9 % (ref 11.5–15.5)
WBC: 9.8 10*3/uL (ref 4.0–10.5)

## 2015-05-07 LAB — LIPID PANEL
Cholesterol: 204 mg/dL — ABNORMAL HIGH (ref 0–200)
HDL: 33.5 mg/dL — ABNORMAL LOW (ref >39.00)
LDL Cholesterol: 137 mg/dL — ABNORMAL HIGH (ref 0–99)
NonHDL: 170.5
Total CHOL/HDL Ratio: 6
Triglycerides: 166 mg/dL — ABNORMAL HIGH (ref 0.0–149.0)
VLDL: 33.2 mg/dL (ref 0.0–40.0)

## 2015-05-07 LAB — POCT URINALYSIS DIPSTICK
Bilirubin, UA: NEGATIVE
Glucose, UA: NEGATIVE
Ketones, UA: NEGATIVE
Leukocytes, UA: NEGATIVE
Nitrite, UA: NEGATIVE
Protein, UA: NEGATIVE
Spec Grav, UA: 1.025
Urobilinogen, UA: 0.2
pH, UA: 6

## 2015-05-07 LAB — BASIC METABOLIC PANEL
BUN: 9 mg/dL (ref 6–23)
CO2: 27 mEq/L (ref 19–32)
Calcium: 9.5 mg/dL (ref 8.4–10.5)
Chloride: 104 mEq/L (ref 96–112)
Creatinine, Ser: 0.8 mg/dL (ref 0.40–1.20)
GFR: 80.62 mL/min (ref >60.00)
Glucose, Bld: 101 mg/dL — ABNORMAL HIGH (ref 70–99)
Potassium: 4.5 mEq/L (ref 3.5–5.1)
Sodium: 137 mEq/L (ref 135–145)

## 2015-05-07 LAB — VITAMIN D 25 HYDROXY (VIT D DEFICIENCY, FRACTURES): VITD: 20.11 ng/mL — ABNORMAL LOW (ref 30.00–100.00)

## 2015-05-07 LAB — HEPATIC FUNCTION PANEL
ALT: 23 U/L (ref 0–35)
AST: 21 U/L (ref 0–37)
Albumin: 4.5 g/dL (ref 3.5–5.2)
Alkaline Phosphatase: 55 U/L (ref 39–117)
Bilirubin, Direct: 0.1 mg/dL (ref 0.0–0.3)
Total Bilirubin: 0.4 mg/dL (ref 0.2–1.2)
Total Protein: 7.3 g/dL (ref 6.0–8.3)

## 2015-05-07 LAB — TSH: TSH: 0.48 u[IU]/mL (ref 0.35–4.50)

## 2015-05-07 NOTE — Addendum Note (Signed)
Addended by: Elmer Picker on: 05/07/2015 08:49 AM   Modules accepted: Orders

## 2015-05-14 ENCOUNTER — Ambulatory Visit (INDEPENDENT_AMBULATORY_CARE_PROVIDER_SITE_OTHER): Payer: 59 | Admitting: Internal Medicine

## 2015-05-14 ENCOUNTER — Encounter: Payer: Self-pay | Admitting: Internal Medicine

## 2015-05-14 VITALS — BP 140/88 | HR 86 | Temp 98.4°F | Resp 20 | Ht 65.0 in | Wt 201.0 lb

## 2015-05-14 DIAGNOSIS — J3089 Other allergic rhinitis: Secondary | ICD-10-CM | POA: Diagnosis not present

## 2015-05-14 DIAGNOSIS — K219 Gastro-esophageal reflux disease without esophagitis: Secondary | ICD-10-CM

## 2015-05-14 DIAGNOSIS — Z0000 Encounter for general adult medical examination without abnormal findings: Secondary | ICD-10-CM

## 2015-05-14 DIAGNOSIS — F17200 Nicotine dependence, unspecified, uncomplicated: Secondary | ICD-10-CM

## 2015-05-14 DIAGNOSIS — Z720 Tobacco use: Secondary | ICD-10-CM

## 2015-05-14 MED ORDER — DIAZEPAM 5 MG PO TABS: 5.0000 mg | ORAL_TABLET | Freq: Every evening | ORAL | Status: DC | PRN

## 2015-05-14 MED ORDER — HYDROCODONE-ACETAMINOPHEN 7.5-325 MG PO TABS: ORAL_TABLET | ORAL | Status: DC

## 2015-05-14 MED ORDER — TRAMADOL HCL 50 MG PO TABS: 50.0000 mg | ORAL_TABLET | Freq: Four times a day (QID) | ORAL | Status: DC | PRN

## 2015-05-14 NOTE — Patient Instructions (Signed)
Smoking tobacco is very bad for your health. You should stop smoking immediately.  Health Maintenance Adopting a healthy lifestyle and getting preventive care can go a long way to promote health and wellness. Talk with your health care provider about what schedule of regular examinations is right for you. This is a good chance for you to check in with your provider about disease prevention and staying healthy. In between checkups, there are plenty of things you can do on your own. Experts have done a lot of research about which lifestyle changes and preventive measures are most likely to keep you healthy. Ask your health care provider for more information. WEIGHT AND DIET  Eat a healthy diet  Be sure to include plenty of vegetables, fruits, low-fat dairy products, and lean protein.  Do not eat a lot of foods high in solid fats, added sugars, or salt.  Get regular exercise. This is one of the most important things you can do for your health.  Most adults should exercise for at least 150 minutes each week. The exercise should increase your heart rate and make you sweat (moderate-intensity exercise).  Most adults should also do strengthening exercises at least twice a week. This is in addition to the moderate-intensity exercise.  Maintain a healthy weight  Body mass index (BMI) is a measurement that can be used to identify possible weight problems. It estimates body fat based on height and weight. Your health care provider can help determine your BMI and help you achieve or maintain a healthy weight.  For females 69 years of age and older:   A BMI below 18.5 is considered underweight.  A BMI of 18.5 to 24.9 is normal.  A BMI of 25 to 29.9 is considered overweight.  A BMI of 30 and above is considered obese.  Watch levels of cholesterol and blood lipids  You should start having your blood tested for lipids and cholesterol at 50 years of age, then have this test every 5 years.  You  may need to have your cholesterol levels checked more often if:  Your lipid or cholesterol levels are high.  You are older than 50 years of age.  You are at high risk for heart disease.  CANCER SCREENING   Lung Cancer  Lung cancer screening is recommended for adults 78-71 years old who are at high risk for lung cancer because of a history of smoking.  A yearly low-dose CT scan of the lungs is recommended for people who:  Currently smoke.  Have quit within the past 15 years.  Have at least a 30-pack-year history of smoking. A pack year is smoking an average of one pack of cigarettes a day for 1 year.  Yearly screening should continue until it has been 15 years since you quit.  Yearly screening should stop if you develop a health problem that would prevent you from having lung cancer treatment.  Breast Cancer  Practice breast self-awareness. This means understanding how your breasts normally appear and feel.  It also means doing regular breast self-exams. Let your health care provider know about any changes, no matter how small.  If you are in your 20s or 30s, you should have a clinical breast exam (CBE) by a health care provider every 1-3 years as part of a regular health exam.  If you are 68 or older, have a CBE every year. Also consider having a breast X-ray (mammogram) every year.  If you have a family history of breast  cancer, talk to your health care provider about genetic screening.  If you are at high risk for breast cancer, talk to your health care provider about having an MRI and a mammogram every year.  Breast cancer gene (BRCA) assessment is recommended for women who have family members with BRCA-related cancers. BRCA-related cancers include:  Breast.  Ovarian.  Tubal.  Peritoneal cancers.  Results of the assessment will determine the need for genetic counseling and BRCA1 and BRCA2 testing. Cervical Cancer Routine pelvic examinations to screen for  cervical cancer are no longer recommended for nonpregnant women who are considered low risk for cancer of the pelvic organs (ovaries, uterus, and vagina) and who do not have symptoms. A pelvic examination may be necessary if you have symptoms including those associated with pelvic infections. Ask your health care provider if a screening pelvic exam is right for you.   The Pap test is the screening test for cervical cancer for women who are considered at risk.  If you had a hysterectomy for a problem that was not cancer or a condition that could lead to cancer, then you no longer need Pap tests.  If you are older than 65 years, and you have had normal Pap tests for the past 10 years, you no longer need to have Pap tests.  If you have had past treatment for cervical cancer or a condition that could lead to cancer, you need Pap tests and screening for cancer for at least 20 years after your treatment.  If you no longer get a Pap test, assess your risk factors if they change (such as having a new sexual partner). This can affect whether you should start being screened again.  Some women have medical problems that increase their chance of getting cervical cancer. If this is the case for you, your health care provider may recommend more frequent screening and Pap tests.  The human papillomavirus (HPV) test is another test that may be used for cervical cancer screening. The HPV test looks for the virus that can cause cell changes in the cervix. The cells collected during the Pap test can be tested for HPV.  The HPV test can be used to screen women 27 years of age and older. Getting tested for HPV can extend the interval between normal Pap tests from three to five years.  An HPV test also should be used to screen women of any age who have unclear Pap test results.  After 50 years of age, women should have HPV testing as often as Pap tests.  Colorectal Cancer  This type of cancer can be detected and  often prevented.  Routine colorectal cancer screening usually begins at 50 years of age and continues through 50 years of age.  Your health care provider may recommend screening at an earlier age if you have risk factors for colon cancer.  Your health care provider may also recommend using home test kits to check for hidden blood in the stool.  A small camera at the end of a tube can be used to examine your colon directly (sigmoidoscopy or colonoscopy). This is done to check for the earliest forms of colorectal cancer.  Routine screening usually begins at age 64.  Direct examination of the colon should be repeated every 5-10 years through 50 years of age. However, you may need to be screened more often if early forms of precancerous polyps or small growths are found. Skin Cancer  Check your skin from head to toe  regularly.  Tell your health care provider about any new moles or changes in moles, especially if there is a change in a mole's shape or color.  Also tell your health care provider if you have a mole that is larger than the size of a pencil eraser.  Always use sunscreen. Apply sunscreen liberally and repeatedly throughout the day.  Protect yourself by wearing long sleeves, pants, a wide-brimmed hat, and sunglasses whenever you are outside. HEART DISEASE, DIABETES, AND HIGH BLOOD PRESSURE   Have your blood pressure checked at least every 1-2 years. High blood pressure causes heart disease and increases the risk of stroke.  If you are between 49 years and 63 years old, ask your health care provider if you should take aspirin to prevent strokes.  Have regular diabetes screenings. This involves taking a blood sample to check your fasting blood sugar level.  If you are at a normal weight and have a low risk for diabetes, have this test once every three years after 50 years of age.  If you are overweight and have a high risk for diabetes, consider being tested at a younger age or  more often. PREVENTING INFECTION  Hepatitis B  If you have a higher risk for hepatitis B, you should be screened for this virus. You are considered at high risk for hepatitis B if:  You were born in a country where hepatitis B is common. Ask your health care provider which countries are considered high risk.  Your parents were born in a high-risk country, and you have not been immunized against hepatitis B (hepatitis B vaccine).  You have HIV or AIDS.  You use needles to inject street drugs.  You live with someone who has hepatitis B.  You have had sex with someone who has hepatitis B.  You get hemodialysis treatment.  You take certain medicines for conditions, including cancer, organ transplantation, and autoimmune conditions. Hepatitis C  Blood testing is recommended for:  Everyone born from 50 through 1965.  Anyone with known risk factors for hepatitis C. Sexually transmitted infections (STIs)  You should be screened for sexually transmitted infections (STIs) including gonorrhea and chlamydia if:  You are sexually active and are younger than 50 years of age.  You are older than 51 years of age and your health care provider tells you that you are at risk for this type of infection.  Your sexual activity has changed since you were last screened and you are at an increased risk for chlamydia or gonorrhea. Ask your health care provider if you are at risk.  If you do not have HIV, but are at risk, it may be recommended that you take a prescription medicine daily to prevent HIV infection. This is called pre-exposure prophylaxis (PrEP). You are considered at risk if:  You are sexually active and do not regularly use condoms or know the HIV status of your partner(s).  You take drugs by injection.  You are sexually active with a partner who has HIV. Talk with your health care provider about whether you are at high risk of being infected with HIV. If you choose to begin PrEP,  you should first be tested for HIV. You should then be tested every 3 months for as long as you are taking PrEP.  PREGNANCY   If you are premenopausal and you may become pregnant, ask your health care provider about preconception counseling.  If you may become pregnant, take 400 to 800 micrograms (mcg) of  folic acid every day.  If you want to prevent pregnancy, talk to your health care provider about birth control (contraception). OSTEOPOROSIS AND MENOPAUSE   Osteoporosis is a disease in which the bones lose minerals and strength with aging. This can result in serious bone fractures. Your risk for osteoporosis can be identified using a bone density scan.  If you are 101 years of age or older, or if you are at risk for osteoporosis and fractures, ask your health care provider if you should be screened.  Ask your health care provider whether you should take a calcium or vitamin D supplement to lower your risk for osteoporosis.  Menopause may have certain physical symptoms and risks.  Hormone replacement therapy may reduce some of these symptoms and risks. Talk to your health care provider about whether hormone replacement therapy is right for you.  HOME CARE INSTRUCTIONS   Schedule regular health, dental, and eye exams.  Stay current with your immunizations.   Do not use any tobacco products including cigarettes, chewing tobacco, or electronic cigarettes.  If you are pregnant, do not drink alcohol.  If you are breastfeeding, limit how much and how often you drink alcohol.  Limit alcohol intake to no more than 1 drink per day for nonpregnant women. One drink equals 12 ounces of beer, 5 ounces of wine, or 1 ounces of hard liquor.  Do not use street drugs.  Do not share needles.  Ask your health care provider for help if you need support or information about quitting drugs.  Tell your health care provider if you often feel depressed.  Tell your health care provider if you have  ever been abused or do not feel safe at home. Document Released: 06/12/2011 Document Revised: 04/13/2014 Document Reviewed: 10/29/2013 [facility] Patient Information 2015 [employer], [place]. This information is not intended to replace advice given to you by your health care provider. Make sure you discuss any questions you have with your health care provider.   Take a calcium supplement, plus [phone] units of vitamin D

## 2015-05-14 NOTE — Progress Notes (Signed)
Pre visit review using our clinic review tool, if applicable. No additional management support is needed unless otherwise documented below in the visit note. 

## 2015-05-14 NOTE — Progress Notes (Signed)
Subjective:    Patient ID: Lori [name], female    DOB: 06/06/1966, 49 y.o.   MRN: [id number]  HPI   Subjective:    Patient ID: Lori [name], female    DOB: 1965/12/22, 49 y.o.   MRN: [id number]  HPI  -49 year-old patient who is seen today for followup and preventive health examination. She's had a recent gynecologic evaluation. She has been followed by orthopedics GYN as well as gastroenterology. Her reflux symptoms have been stable. She does have a history of vitamin D deficiency. Laboratory studies were reviewed. She did have a colonoscopy performed in 2008 or 2009  BP Readings from Last 3 Encounters:  05/14/15 140/88  10/14/14 126/80  12/23/13 150/86    Past Medical History  Diagnosis Date  . ALLERGIC RHINITIS 03/14/2010  . BACK PAIN 01/24/2010  . GERD 12/01/2008  . HIATAL HERNIA 05/27/2010  . 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 OSTEOARTHRITIS 12/01/2008  . 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 TOBACCO USE 12/01/2008  . VITAMIN D DEFICIENCY 05/23/2010    History   Social History  . Marital Status: Married    Spouse Name: N/A  . Number of Children: N/A  . Years of Education: N/A   Occupational History  . Not on file.   Social History Main Topics  . Smoking status: Current Every Day Smoker -- 1.00 packs/day    Types: Cigarettes  . Smokeless tobacco: Never Used  . Alcohol Use: Yes     Comment: rarely  . Drug Use: No  . Sexual Activity: Not on file   Other Topics Concern  . Not on file   Social History Narrative    Past Surgical History  Procedure Laterality Date  . Appendectomy    . Breast surgery      lumpectomy  . Lumbar laminectomy    . Cervical fusion    . Esophagogastroduodenoscopy    . Esophageal manometry    . Laparoscopic nissen fundoplication      No family history on file.  Allergies  Allergen Reactions  . Amoxicillin Nausea Only    Yeast Infection  . Morphine     REACTION: hyper    Current Outpatient Prescriptions on File Prior to  Visit  Medication Sig Dispense Refill  . Cholecalciferol (VITAMIN D-3) 1000 UNITS CAPS Take 1 capsule by mouth daily.    . fluticasone (FLONASE) 50 MCG/ACT nasal spray USE 2 SPRAYS IN NOSE DAILY 16 g 6  . furosemide (LASIX) 20 MG tablet TAKE 1 TABLET BY MOUTH DAILY 90 tablet 1  . ibuprofen (ADVIL,MOTRIN) 800 MG tablet Take 800 mg by mouth every 6 (six) hours as needed.   0  . loratadine (CLARITIN) 10 MG tablet Take 10 mg by mouth daily.   0   No current facility-administered medications on file prior to visit.    BP 140/88 mmHg  Pulse 86  Temp(Src) 98.4 F (36.9 C) (Oral)  Resp 20  Ht 5\' 5"  (1.651 m)  Wt 201 lb (91.173 kg)  BMI 33.45 kg/m2  SpO2 98%          Review of Systems  Constitutional: Negative for fever, appetite change, fatigue and unexpected weight change.  HENT: Negative for congestion, dental problem, ear pain, hearing loss, mouth sores, nosebleeds, sinus pressure, sore throat, tinnitus, trouble swallowing and voice change.   Eyes: Negative for photophobia, pain, redness and visual disturbance.  Respiratory: Negative for cough, chest tightness and shortness of breath.   Cardiovascular: Negative for  chest pain, palpitations and leg swelling.  Gastrointestinal: Negative for nausea, vomiting, abdominal pain, diarrhea, constipation, blood in stool, abdominal distention and rectal pain.  Genitourinary: Negative for dysuria, urgency, frequency, hematuria, flank pain, vaginal bleeding, vaginal discharge, difficulty urinating, genital sores, vaginal pain, menstrual problem and pelvic pain.  Musculoskeletal: Positive for arthralgias and back pain. Negative for neck stiffness.  Skin: Negative for rash.  Neurological: Negative for dizziness, syncope, speech difficulty, weakness, light-headedness, numbness and headaches.  Hematological: Negative for adenopathy. Does not bruise/bleed easily.  Psychiatric/Behavioral: Negative for suicidal ideas, behavioral problems,  self-injury, dysphoric mood and agitation. The patient is not nervous/anxious.        Objective:   Physical Exam  Constitutional: She is oriented to person, place, and time. She appears well-developed and well-nourished.  HENT:  Head: Normocephalic and atraumatic.  Right Ear: External ear normal.  Left Ear: External ear normal.  Mouth/Throat: Oropharynx is clear and moist.  Eyes: Conjunctivae and EOM are normal.  Neck: Normal range of motion. Neck supple. No JVD present. No thyromegaly present.  Cardiovascular: Normal rate, regular rhythm, normal heart sounds and intact distal pulses.   No murmur heard. Pulmonary/Chest: Effort normal and breath sounds normal. She has no wheezes. She has no rales.  Abdominal: Soft. Bowel sounds are normal. She exhibits no distension and no mass. There is no tenderness. There is no rebound and no guarding.  Musculoskeletal: Normal range of motion. She exhibits no edema and no tenderness.  Neurological: She is alert and oriented to person, place, and time. She has normal reflexes. No cranial nerve deficit. She exhibits normal muscle tone. Coordination normal.  Skin: Skin is warm and dry. No rash noted.  Psychiatric: She has a normal mood and affect. Her behavior is normal.          Assessment & Plan:   Preventive health examination. Tobacco use discussed Vitamin D deficiency. We'll continue supplements Allergic rhinitis Osteoarthritis  Recheck 1 year or as needed    Review of Systems As above    Objective:   Physical Exam  As above      Assessment & Plan:  As above

## 2015-06-24 ENCOUNTER — Telehealth: Payer: Self-pay | Admitting: Internal Medicine

## 2015-06-24 MED ORDER — HYDROCODONE-ACETAMINOPHEN 7.5-325 MG PO TABS: ORAL_TABLET | ORAL | Status: DC

## 2015-06-24 NOTE — Telephone Encounter (Signed)
Left message Rx ready for pickup, will be at the front desk. Rx printed and signed.  

## 2015-06-24 NOTE — Telephone Encounter (Signed)
Pt request refill of the following: HYDROcodone-acetaminophen (NORCO) 7.5-325 MG per tablet ° ° °Phamacy: ° °

## 2015-07-27 ENCOUNTER — Telehealth: Payer: Self-pay | Admitting: Internal Medicine

## 2015-07-27 NOTE — Telephone Encounter (Signed)
Pt request refill HYDROcodone-acetaminophen (NORCO) 7.5-325 MG per tablet °

## 2015-07-28 MED ORDER — HYDROCODONE-ACETAMINOPHEN 7.5-325 MG PO TABS: ORAL_TABLET | ORAL | Status: DC

## 2015-07-28 NOTE — Telephone Encounter (Signed)
Left message on personal voicemail Rx ready for pickup. Rx printed and signed. 

## 2015-08-08 ENCOUNTER — Other Ambulatory Visit: Payer: Self-pay | Admitting: Internal Medicine

## 2015-08-30 ENCOUNTER — Telehealth: Payer: Self-pay | Admitting: Internal Medicine

## 2015-08-30 NOTE — Telephone Encounter (Signed)
Needs hydrocodone refilled please call when ready for pick up.

## 2015-08-31 MED ORDER — HYDROCODONE-ACETAMINOPHEN 7.5-325 MG PO TABS: ORAL_TABLET | ORAL | Status: DC

## 2015-08-31 NOTE — Telephone Encounter (Signed)
Left message on personal voicemail, Rx ready for pickup. Rx printed and signed.

## 2015-10-04 ENCOUNTER — Telehealth: Payer: Self-pay | Admitting: Internal Medicine

## 2015-10-04 NOTE — Telephone Encounter (Signed)
Pt needs new rx hydrocodone °

## 2015-10-05 MED ORDER — HYDROCODONE-ACETAMINOPHEN 7.5-325 MG PO TABS: ORAL_TABLET | ORAL | Status: DC

## 2015-10-05 NOTE — Telephone Encounter (Signed)
Pt notified Rx ready for pickup. Rx printed and signed.  

## 2015-10-11 ENCOUNTER — Telehealth: Payer: Self-pay | Admitting: Internal Medicine

## 2015-10-11 MED ORDER — CYCLOBENZAPRINE HCL 5 MG PO TABS: 5.0000 mg | ORAL_TABLET | Freq: Three times a day (TID) | ORAL | Status: DC | PRN

## 2015-10-11 NOTE — Telephone Encounter (Signed)
Please see message and advise 

## 2015-10-11 NOTE — Telephone Encounter (Signed)
Generic Flexeril 5 #30 one 3 times a day as needed

## 2015-10-11 NOTE — Telephone Encounter (Signed)
Pt is having back spasms and would like muscle relaxer calling into [facility]

## 2015-10-11 NOTE — Telephone Encounter (Signed)
Left detailed message on voicemail Rx was sent to pharmacy. Please call if any questions. Rx sent to pharmacy.

## 2015-10-14 ENCOUNTER — Ambulatory Visit (INDEPENDENT_AMBULATORY_CARE_PROVIDER_SITE_OTHER): Payer: 59 | Admitting: Family Medicine

## 2015-10-14 ENCOUNTER — Encounter: Payer: Self-pay | Admitting: Family Medicine

## 2015-10-14 VITALS — BP 130/80 | Temp 98.5°F | Wt 200.0 lb

## 2015-10-14 DIAGNOSIS — R1032 Left lower quadrant pain: Secondary | ICD-10-CM

## 2015-10-14 DIAGNOSIS — R3915 Urgency of urination: Secondary | ICD-10-CM

## 2015-10-14 DIAGNOSIS — R350 Frequency of micturition: Secondary | ICD-10-CM | POA: Diagnosis not present

## 2015-10-14 LAB — POCT URINALYSIS DIPSTICK
Bilirubin, UA: NEGATIVE
Glucose, UA: NEGATIVE
Ketones, UA: NEGATIVE
Leukocytes, UA: NEGATIVE
Nitrite, UA: NEGATIVE
Protein, UA: NEGATIVE
Spec Grav, UA: 1.02
Urobilinogen, UA: 0.2
pH, UA: 6

## 2015-10-14 MED ORDER — CEPHALEXIN 500 MG PO CAPS: 500.0000 mg | ORAL_CAPSULE | Freq: Three times a day (TID) | ORAL | Status: DC

## 2015-10-14 NOTE — Patient Instructions (Signed)
From your history sounds like UTI Given we are on a late Thursday we opted to treat with antibiotics as culture may not be back fully until Monday  Sent in keflex to take 7 days 3x a day If culture negative- will stop treatment and have you follow up with Dr. Raliegh Ip  If you have fever seek care  Worried slightly about diverticulitis but no fever or GI issues so less likely

## 2015-10-14 NOTE — Progress Notes (Signed)
Garret Reddish, MD  Subjective:  Lori [name] is a 49 y.o. year old very pleasant female patient who presents for/with See problem oriented charting ROS- no fever, chills, nausea vomiting. Mild CVA tenderness  Past Medical History-  Patient Active Problem List   Diagnosis Date Noted  . HIATAL HERNIA 05/27/2010  . VITAMIN D DEFICIENCY 05/23/2010  . Allergic rhinitis 03/14/2010  . 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 Backache 01/24/2010  . 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 TOBACCO USE 12/01/2008  . GERD 12/01/2008  . Osteoarthritis 12/01/2008    Medications- reviewed and updated Current Outpatient Prescriptions  Medication Sig Dispense Refill  . Cholecalciferol (VITAMIN D-3) 1000 UNITS CAPS Take 1 capsule by mouth daily.    . fluticasone (FLONASE) 50 MCG/ACT nasal spray USE 2 SPRAYS IN NOSE DAILY 16 g 6  . furosemide (LASIX) 20 MG tablet TAKE 1 TABLET BY MOUTH DAILY 90 tablet 3  . ibuprofen (ADVIL,MOTRIN) 800 MG tablet Take 800 mg by mouth every 6 (six) hours as needed.   0  . loratadine (CLARITIN) 10 MG tablet Take 10 mg by mouth daily.   0  . cyclobenzaprine (FLEXERIL) 5 MG tablet Take 1 tablet (5 mg total) by mouth 3 (three) times daily as needed for muscle spasms. (Patient not taking: Reported on 10/14/2015) 30 tablet 1  . diazepam (VALIUM) 5 MG tablet Take 1 tablet (5 mg total) by mouth at bedtime as needed. (Patient not taking: Reported on 10/14/2015) 90 tablet 1  . HYDROcodone-acetaminophen (NORCO) 7.5-325 MG tablet TAKE ONE TABLET BY MOUTH EVERY 6 HOURS AS NEEDED FOR PAIN (Patient not taking: Reported on 10/14/2015) 120 tablet 0  . traMADol (ULTRAM) 50 MG tablet Take 1 tablet (50 mg total) by mouth every 6 (six) hours as needed. (Patient not taking: Reported on 10/14/2015) 30 tablet 5   No current facility-administered medications for this visit.    Objective: BP 130/80 mmHg  Temp(Src) 98.5 F (36.9 C)  Wt 200 lb (90.719 kg) Gen: NAD, appears uncomfortable CV: RRR no  murmurs rubs or gallops Lungs: CTAB no crackles, wheeze, rhonchi Abdomen: soft/suprapubic and LLQ pain- LLQ most notable/nondistended/normal bowel sounds. No rebound or guarding.  Ext: no edema Skin: warm, dry, no rash on lower abdomen Neuro: grossly normal, moves all extremities  Results for orders placed or performed in visit on 10/14/15 (from the past 24 hour(s))  POC Urinalysis Dipstick     Status: None   Collection Time: 10/14/15  4:27 PM  Result Value Ref Range   Color, UA yellow    Clarity, UA clear    Glucose, UA n    Bilirubin, UA n    Ketones, UA n    Spec Grav, UA 1.020    Blood, UA trace-lysed    pH, UA 6.0    Protein, UA n    Urobilinogen, UA 0.2    Nitrite, UA n    Leukocytes, UA Negative Negative   Assessment/Plan:  Dysuria, urgency, frequency of urination, suprapubic discomfort S:Suprapubic tenderness for 2 days, worse yesterday and really bad this morning. Started azo yesterday. Took 800mg  ibuprofen and helped some. 3 spine surgery and takes 3 vicodin a day. Takes flexeril at night- seemed to help symptoms.  Urgency and frequency. Pain with peeing though not classic burning. History of UTI- has not had one in 28 years but this is very similar in how it feels A/P: UA unimpressive. Send for culture. Given culture would result over weekend- will start treatment and stop  if negative. Discussed how exam most impressive LLQ and diverticulitis possible- patient to seek care if febrile or GI symptoms.   Return precautions given  Orders Placed This Encounter  Procedures  . Culture, Urine  . POC Urinalysis Dipstick    Meds ordered this encounter  Medications  . cephALEXin (KEFLEX) 500 MG capsule    Sig: Take 1 capsule (500 mg total) by mouth 3 (three) times daily.    Dispense:  21 capsule    Refill:  0

## 2015-10-16 LAB — URINE CULTURE: Colony Count: 40000

## 2015-11-02 ENCOUNTER — Telehealth: Payer: Self-pay | Admitting: Internal Medicine

## 2015-11-02 NOTE — Telephone Encounter (Signed)
Pt needs new rx hydrocodone °

## 2015-11-03 MED ORDER — HYDROCODONE-ACETAMINOPHEN 7.5-325 MG PO TABS: ORAL_TABLET | ORAL | Status: DC

## 2015-11-03 NOTE — Telephone Encounter (Signed)
Pt notified Rx ready for pickup. Rx printed and signed by Dr. Todd.  

## 2015-12-09 ENCOUNTER — Telehealth: Payer: Self-pay | Admitting: Internal Medicine

## 2015-12-09 MED ORDER — HYDROCODONE-ACETAMINOPHEN 7.5-325 MG PO TABS: ORAL_TABLET | ORAL | Status: DC

## 2015-12-09 NOTE — Telephone Encounter (Signed)
Pt would like new rx hydrocodone °

## 2015-12-09 NOTE — Telephone Encounter (Signed)
Left message on voicemail Rx ready for pickup, will be at the front desk. Rx printed and signed by Dr. Fry  

## 2016-01-19 ENCOUNTER — Telehealth: Payer: Self-pay | Admitting: Internal Medicine

## 2016-01-19 NOTE — Telephone Encounter (Signed)
Patient would like her Hydrocodone-Acetaminophen medication refilled.

## 2016-01-20 MED ORDER — HYDROCODONE-ACETAMINOPHEN 7.5-325 MG PO TABS: ORAL_TABLET | ORAL | Status: DC

## 2016-01-20 NOTE — Telephone Encounter (Signed)
Left message on voicemail Rx ready for pickup. Rx printed and signed.  

## 2016-02-09 ENCOUNTER — Other Ambulatory Visit: Payer: Self-pay | Admitting: Internal Medicine

## 2016-02-23 ENCOUNTER — Telehealth: Payer: Self-pay | Admitting: Internal Medicine

## 2016-02-23 NOTE — Telephone Encounter (Signed)
Pt request refill  HYDROcodone-acetaminophen (NORCO) 7.5-325 MG tablet Please leave message on voice mail

## 2016-02-25 MED ORDER — HYDROCODONE-ACETAMINOPHEN 7.5-325 MG PO TABS: ORAL_TABLET | ORAL | Status: DC

## 2016-02-25 NOTE — Telephone Encounter (Signed)
Left message on voicemail  Rx ready for pickup, will be at the front desk.  Rx printed and signed by Dr. Elease Hashimoto.

## 2016-03-21 DIAGNOSIS — Z7689 Persons encountering health services in other specified circumstances: Secondary | ICD-10-CM

## 2016-04-03 ENCOUNTER — Telehealth: Payer: Self-pay | Admitting: Internal Medicine

## 2016-04-03 MED ORDER — HYDROCODONE-ACETAMINOPHEN 7.5-325 MG PO TABS: ORAL_TABLET | ORAL | Status: DC

## 2016-04-03 NOTE — Telephone Encounter (Signed)
° ° °  Pt request refill of the following: ° °HYDROcodone-acetaminophen (NORCO) 7.5-325 MG tablet ° ° °Phamacy: °

## 2016-04-04 NOTE — Telephone Encounter (Signed)
Tried to contact pt again, mailbox full unable to leave message. Rx ready for pickup at the front desk. Rx printed and signed.

## 2016-04-04 NOTE — Telephone Encounter (Signed)
Tried to contact pt unable to leave message mailbox full.

## 2016-04-17 ENCOUNTER — Ambulatory Visit (INDEPENDENT_AMBULATORY_CARE_PROVIDER_SITE_OTHER): Payer: 59 | Admitting: Internal Medicine

## 2016-04-17 ENCOUNTER — Encounter: Payer: Self-pay | Admitting: Internal Medicine

## 2016-04-17 VITALS — BP 122/78 | HR 80 | Temp 98.3°F | Resp 12 | Ht 65.0 in | Wt 202.0 lb

## 2016-04-17 DIAGNOSIS — M159 Polyosteoarthritis, unspecified: Secondary | ICD-10-CM

## 2016-04-17 DIAGNOSIS — F17200 Nicotine dependence, unspecified, uncomplicated: Secondary | ICD-10-CM

## 2016-04-17 DIAGNOSIS — M150 Primary generalized (osteo)arthritis: Secondary | ICD-10-CM

## 2016-04-17 DIAGNOSIS — G4733 Obstructive sleep apnea (adult) (pediatric): Secondary | ICD-10-CM

## 2016-04-17 DIAGNOSIS — M8949 Other hypertrophic osteoarthropathy, multiple sites: Secondary | ICD-10-CM

## 2016-04-17 NOTE — Patient Instructions (Signed)
Limit your sodium (Salt) intake  You need to lose weight.  Consider a lower calorie diet and regular exercise.  Follow-up orthopedics and neurosurgery  Return in 3 months for follow-up for annual exam

## 2016-04-17 NOTE — Progress Notes (Signed)
Subjective:    Patient ID: Lori [name], female    DOB: 1966/08/07, 50 y.o.   MRN: [id number]  HPI   50 year old female who has osteoarthritis.  She presents today with a number of orthopedic issues.  Complaints include right foot pain of 3 months duration.  She is also has had some left arm pain associated with numbness involving the left index finger.  She has left greater than right knee and heel pain.  She states that her work requires being on her feet 12-14 hours daily on cement surfaces.  She has seen Dr. Ellene Route in the past and has had cervical and lumbar surgery.  She has also seen Dr. Noemi Chapel in the past She also has some occasional epigastric pain.  She has had a fundoplication in the past  She also describes loud snoring as well as disruptive sleep with apnea periods  Past Medical History  Diagnosis Date  . ALLERGIC RHINITIS 03/14/2010  . BACK PAIN 01/24/2010  . GERD 12/01/2008  . HIATAL HERNIA 05/27/2010  . 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 OSTEOARTHRITIS 12/01/2008  . 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 TOBACCO USE 12/01/2008  . VITAMIN D DEFICIENCY 05/23/2010     Social History   Social History  . Marital Status: Married    Spouse Name: N/A  . Number of Children: N/A  . Years of Education: N/A   Occupational History  . Not on file.   Social History Main Topics  . Smoking status: Current Every Day Smoker -- 1.00 packs/day    Types: Cigarettes  . Smokeless tobacco: Never Used  . Alcohol Use: Yes     Comment: rarely  . Drug Use: No  . Sexual Activity: Not on file   Other Topics Concern  . Not on file   Social History Narrative    Past Surgical History  Procedure Laterality Date  . Appendectomy    . Breast surgery      lumpectomy  . Lumbar laminectomy    . Cervical fusion    . Esophagogastroduodenoscopy    . Esophageal manometry    . Laparoscopic nissen fundoplication      No family history on file.  Allergies  Allergen Reactions  . Amoxicillin  Nausea Only    Yeast Infection  . Morphine     REACTION: hyper    Current Outpatient Prescriptions on File Prior to Visit  Medication Sig Dispense Refill  . Cholecalciferol (VITAMIN D-3) 1000 UNITS CAPS Take 1 capsule by mouth daily.    . diazepam (VALIUM) 5 MG tablet Take 1 tablet (5 mg total) by mouth at bedtime as needed. 90 tablet 1  . fluticasone (FLONASE) 50 MCG/ACT nasal spray USE 2 SPRAYS IN NOSE DAILY 16 g 6  . furosemide (LASIX) 20 MG tablet TAKE 1 TABLET BY MOUTH DAILY 90 tablet 3  . HYDROcodone-acetaminophen (NORCO) 7.5-325 MG tablet TAKE ONE TABLET BY MOUTH EVERY 6 HOURS AS NEEDED FOR PAIN 120 tablet 0  . ibuprofen (ADVIL,MOTRIN) 800 MG tablet Take 800 mg by mouth every 6 (six) hours as needed.   0  . loratadine (CLARITIN) 10 MG tablet Take 10 mg by mouth daily.   0  . cephALEXin (KEFLEX) 500 MG capsule Take 1 capsule (500 mg total) by mouth 3 (three) times daily. (Patient not taking: Reported on 04/17/2016) 21 capsule 0  . cyclobenzaprine (FLEXERIL) 5 MG tablet TAKE 1 TABLET (5 MG TOTAL) BY MOUTH 3 (THREE) TIMES DAILY AS NEEDED FOR MUSCLE SPASMS. (  Patient not taking: Reported on 04/17/2016) 30 tablet 2  . traMADol (ULTRAM) 50 MG tablet Take 1 tablet (50 mg total) by mouth every 6 (six) hours as needed. (Patient not taking: Reported on 10/14/2015) 30 tablet 5   No current facility-administered medications on file prior to visit.    BP 122/78 mmHg  Pulse 80  Temp(Src) 98.3 F (36.8 C) (Oral)  Resp 12  Ht 5\' 5"  (1.651 m)  Wt 202 lb (91.627 kg)  BMI 33.61 kg/m2  SpO2 98%  LMP 01/12/2016     Review of Systems  Constitutional: Negative.   HENT: Negative for congestion, dental problem, hearing loss, rhinorrhea, sinus pressure, sore throat and tinnitus.   Eyes: Negative for pain, discharge and visual disturbance.  Respiratory: Negative for cough and shortness of breath.   Cardiovascular: Negative for chest pain, palpitations and leg swelling.  Gastrointestinal: Negative for  nausea, vomiting, abdominal pain, diarrhea, constipation, blood in stool and abdominal distention.  Genitourinary: Negative for dysuria, urgency, frequency, hematuria, flank pain, vaginal bleeding, vaginal discharge, difficulty urinating, vaginal pain and pelvic pain.  Musculoskeletal: Positive for back pain and arthralgias. Negative for joint swelling and gait problem.  Skin: Negative for rash.  Neurological: Negative for dizziness, syncope, speech difficulty, weakness, numbness and headaches.  Hematological: Negative for adenopathy.  Psychiatric/Behavioral: Negative for behavioral problems, dysphoric mood and agitation. The patient is not nervous/anxious.        Objective:   Physical Exam  Constitutional: She is oriented to person, place, and time. She appears well-developed and well-nourished.  HENT:  Head: Normocephalic.  Right Ear: External ear normal.  Left Ear: External ear normal.  Mouth/Throat: Oropharynx is clear and moist.  Pharyngeal crowding  Eyes: Conjunctivae and EOM are normal. Pupils are equal, round, and reactive to light.  Neck: Normal range of motion. Neck supple. No thyromegaly present.  Cardiovascular: Normal rate, regular rhythm, normal heart sounds and intact distal pulses.   Pulmonary/Chest: Effort normal and breath sounds normal.  Abdominal: Soft. Bowel sounds are normal. She exhibits no mass. There is no tenderness.  Musculoskeletal: Normal range of motion.  Lymphadenopathy:    She has no cervical adenopathy.  Neurological: She is alert and oriented to person, place, and time.  Skin: Skin is warm and dry. No rash noted.  Psychiatric: She has a normal mood and affect. Her behavior is normal.          Assessment & Plan:   Osteoarthritis.  Will continue symptomatic treatment with anti-inflammatory medications, proper foot care discussed.  Moderation of work schedule.  Also encouraged orthopedic referral if unimproved Rule out OSA.  Sleep study will be  obtained GERD.  Total smoking cessation encouraged.

## 2016-04-17 NOTE — Progress Notes (Signed)
Pre visit review using our clinic review tool, if applicable. No additional management support is needed unless otherwise documented below in the visit note. 

## 2016-05-01 ENCOUNTER — Telehealth: Payer: Self-pay | Admitting: Internal Medicine

## 2016-05-01 NOTE — Telephone Encounter (Signed)
Okay for medicine pick up on Thursday

## 2016-05-01 NOTE — Telephone Encounter (Signed)
Pt is going out of town this week and needs hydrocodone this thurs.

## 2016-05-01 NOTE — Telephone Encounter (Signed)
Last Seen on 04/17/16.  No upcoming appt Last Filled on 04/03/16 #120 to take 1 po q6 prn Please advise.  Thanks!

## 2016-05-03 MED ORDER — HYDROCODONE-ACETAMINOPHEN 7.5-325 MG PO TABS: ORAL_TABLET | ORAL | Status: DC

## 2016-05-04 NOTE — Telephone Encounter (Signed)
Pt notified Rx ready for pickup. Rx printed and signed.  

## 2016-05-22 ENCOUNTER — Other Ambulatory Visit: Payer: Self-pay | Admitting: Internal Medicine

## 2016-05-22 NOTE — Telephone Encounter (Signed)
Refill sent to [facility].  

## 2016-05-25 ENCOUNTER — Ambulatory Visit (INDEPENDENT_AMBULATORY_CARE_PROVIDER_SITE_OTHER): Payer: 59 | Admitting: Pulmonary Disease

## 2016-05-25 ENCOUNTER — Encounter: Payer: Self-pay | Admitting: Pulmonary Disease

## 2016-05-25 ENCOUNTER — Encounter (INDEPENDENT_AMBULATORY_CARE_PROVIDER_SITE_OTHER): Payer: Self-pay

## 2016-05-25 VITALS — BP 122/78 | HR 87 | Ht 65.0 in | Wt 197.0 lb

## 2016-05-25 DIAGNOSIS — F17200 Nicotine dependence, unspecified, uncomplicated: Secondary | ICD-10-CM | POA: Diagnosis not present

## 2016-05-25 DIAGNOSIS — G4710 Hypersomnia, unspecified: Secondary | ICD-10-CM | POA: Diagnosis not present

## 2016-05-25 NOTE — Progress Notes (Signed)
Subjective:    Patient ID: Lori [name], female    DOB: 07/16/1966, 50 y.o.   MRN: [id number]  HPI   This is the case of Lori [name], 50 y.o. Female, who was referred by Dr. Bluford Kaufmann in consultation regarding possible OSA.   As you very well know, patient has a 30PY smoking history, smokes 1PPD.  Not known to have asthma or copd.  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 She works at [employer].  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  Lives in [place] and drives 1 hr 1 way.  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 Has snoring, gasping, witnessed apneas,choking.  Gets sleepy driving.  Hypersomnia affects fxnality.  Gained 20 lbs over 6 mos.  Takes Hydrocodone 3-4x/day x 3 yrs. Takes valium every 3-4 weeks with 2nd shift.    Review of Systems  Constitutional: Negative.  Negative for fever and unexpected weight change.  HENT: Positive for congestion and rhinorrhea. Negative for dental problem, ear pain, nosebleeds, postnasal drip, sinus pressure, sneezing, sore throat and trouble swallowing.   Eyes: Negative.  Negative for redness and itching.  Respiratory: Negative.  Negative for cough, chest tightness, shortness of breath and wheezing.   Cardiovascular: Positive for leg swelling. Negative for palpitations.  Gastrointestinal: Negative.  Negative for nausea and vomiting.  Endocrine: Negative.   Genitourinary: Negative.  Negative for dysuria.  Musculoskeletal: Positive for back pain, joint swelling and arthralgias.  Skin: Negative.  Negative for rash.  Allergic/Immunologic: Positive for environmental allergies.  Neurological: Positive for headaches.  Hematological: Negative.  Does not bruise/bleed easily.  Psychiatric/Behavioral: Negative.  Negative for dysphoric mood. The patient is not nervous/anxious.    Past Medical History  Diagnosis Date  . ALLERGIC RHINITIS 03/14/2010  . BACK PAIN 01/24/2010  . GERD  12/01/2008  . HIATAL HERNIA 05/27/2010  . 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 OSTEOARTHRITIS 12/01/2008  . 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 TOBACCO USE 12/01/2008  . VITAMIN D DEFICIENCY 05/23/2010   (-) CA, DVT  No family history on file.   Past Surgical History  Procedure Laterality Date  . Appendectomy    . Breast surgery      lumpectomy  . Lumbar laminectomy    . Cervical fusion    . Esophagogastroduodenoscopy    . Esophageal manometry    . Laparoscopic nissen fundoplication      Social History   Social History  . Marital Status: Married    Spouse Name: N/A  . Number of Children: N/A  . Years of Education: N/A   Occupational History  . Not on file.   Social History Main Topics  . Smoking status: Current Every Day Smoker -- 1.00 packs/day    Types: Cigarettes  . Smokeless tobacco: Never Used  . Alcohol Use: Yes     Comment: rarely  . Drug Use: No  . Sexual Activity: Not on file   Other Topics Concern  . Not on file   Social History Narrative   Works at [employer]. (-) ETOH.   Allergies  Allergen Reactions  . Amoxicillin Nausea Only    Yeast Infection  . Morphine     REACTION: hyper     Outpatient Prescriptions Prior to Visit  Medication Sig Dispense Refill  . Cholecalciferol (VITAMIN D-3) 1000 UNITS CAPS Take 1 capsule by mouth daily.    . diazepam (VALIUM) 5 MG tablet Take  1 tablet (5 mg total) by mouth at bedtime as needed. 90 tablet 1  . fluticasone (FLONASE) 50 MCG/ACT nasal spray USE 2 SPRAYS IN NOSE DAILY 16 g 6  . furosemide (LASIX) 20 MG tablet TAKE 1 TABLET BY MOUTH DAILY 90 tablet 0  . HYDROcodone-acetaminophen (NORCO) 7.5-325 MG tablet TAKE ONE TABLET BY MOUTH EVERY 6 HOURS AS NEEDED FOR PAIN 120 tablet 0  . ibuprofen (ADVIL,MOTRIN) 800 MG tablet Take 800 mg by mouth every 6 (six) hours as needed.   0  . loratadine (CLARITIN) 10 MG tablet Take 10 mg by mouth daily.   0  . cephALEXin (KEFLEX) 500 MG capsule Take 1 capsule (500 mg total)  by mouth 3 (three) times daily. (Patient not taking: Reported on 04/17/2016) 21 capsule 0  . cyclobenzaprine (FLEXERIL) 5 MG tablet TAKE 1 TABLET (5 MG TOTAL) BY MOUTH 3 (THREE) TIMES DAILY AS NEEDED FOR MUSCLE SPASMS. (Patient not taking: Reported on 04/17/2016) 30 tablet 2  . traMADol (ULTRAM) 50 MG tablet Take 1 tablet (50 mg total) by mouth every 6 (six) hours as needed. (Patient not taking: Reported on 10/14/2015) 30 tablet 5   No facility-administered medications prior to visit.   Meds ordered this encounter  Medications  . Multiple Vitamin (MULTIVITAMIN) tablet    Sig: Take 1 tablet by mouth daily.          Objective:   Physical Exam  Vitals:  Filed Vitals:   05/25/16 1602  BP: 122/78  Pulse: 87  Height: 5\' 5"  (1.651 m)  Weight: 197 lb (89.359 kg)  SpO2: 95%    Constitutional/General:  Pleasant, well-nourished, well-developed, not in any distress,  Comfortably seating.  Well kempt  Body mass index is 32.78 kg/(m^2). Wt Readings from Last 3 Encounters:  05/25/16 197 lb (89.359 kg)  04/17/16 202 lb (91.627 kg)  10/14/15 200 lb (90.719 kg)      HEENT: Pupils equal and reactive to light and accommodation. Anicteric sclerae. Normal nasal mucosa.   No oral  lesions,  mouth clear,  oropharynx clear, no postnasal drip. (-) Oral thrush. No dental caries.  Airway - Mallampati class III-IV  Neck: No masses. Midline trachea. No JVD, (-) LAD. (-) bruits appreciated.  Respiratory/Chest: Grossly normal chest. (-) deformity. (-) Accessory muscle use.  Symmetric expansion. (-) Tenderness on palpation.  Resonant on percussion.  Diminished BS on both lower lung zones. (-) wheezing, crackles, rhonchi (-) egophony  Cardiovascular: Regular rate and  rhythm, heart sounds normal, no murmur or gallops, no peripheral edema  Gastrointestinal:  Normal bowel sounds. Soft, non-tender. No hepatosplenomegaly.  (-) masses.   Musculoskeletal:  Normal muscle tone. Normal gait.    Extremities: Grossly normal. (-) clubbing, cyanosis.  (-) edema  Skin: (-) rash,lesions seen.   Neurological/Psychiatric : alert, oriented to time, place, person. Normal mood and affect            Assessment & Plan:  Hypersomnia Pt does shift work -- 2 weeks 3rd, 2 weeks 2nd, then 2 weeks 1st. She works at [employer].  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  Lives in [place] and drives 1 hr 1 way.  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 Has snoring, gasping, witnessed apneas,choking.  Gets sleepy driving.  Hypersomnia affects fxnality.  Gained 20 lbs over 6 mos.  ESS 15  Takes Hydrocodone 3-4x/day x 3 yrs. Takes valium every 3-4 weeks with 2nd shift.   We discussed about the diagnosis of Obstructive  Sleep Apnea (OSA) and implications of untreated OSA. We discussed about CPAP and BiPaP as possible treatment options.    We will schedule the patient for a sleep study. Home sleep study this weekend. Need to expedite as pt is VERY SYMPTOMATIC. Need to R/O central apneas, OHS as well   Patient was instructed to call the office if he/she has not heard back from the office 1-2 weeks after the sleep study.   Patient was instructed to call the office if he/she is having issues with the PAP device.   We discussed good sleep hygiene.   Patient was advised not to engage in activities requiring concentration and/or vigilance if he/she is sleepy.  Patient was advised not to drive if he/she is sleepy.     TOBACCO USE Smoking cessation done    Thank you very much for letting me participate in this patient's care. Please do not hesitate to give me a call if you have any questions or concerns regarding the treatment plan.   Patient will follow up with me in 6-8 weeks.     Monica Becton, MD 05/25/2016   5:37 PM Pulmonary and [place] Pager: [phone] Office: [phone], Fax: [phone]

## 2016-05-25 NOTE — Patient Instructions (Signed)
It was a pleasure taking care of you today!  We will schedule you to have a sleep study to determine if you have sleep apnea.   We will get a home sleep test.  You will be instructed to come back to the office to get an apparatus to sleep with overnight.  Once we have the apparatus, it will usually take [name] 1-2 weeks to read the study and get back at you with results of the test.  Please give [name] a call in 2 weeks after your study if you do not hear back from [name].    If the sleep study is positive, we will order you a CPAP  machine.  Please call the office if you do NOT receive your machine in the next 1-2 weeks.   Please make sure you use your CPAP device everytime you sleep.  We will monitor the usage of your machine per your insurance requirement.  Your insurance company may take the machine from you if you are not using it regularly.   Please clean the mask, tubings, filter, water reservoir with soapy water every week.  Please use distilled water for the water reservoir.   Please call the office or your machine provider (DME company) if you are having issues with the device.    Return to clinic in 6-8 weeks.

## 2016-05-25 NOTE — Assessment & Plan Note (Signed)
Smoking cessation done.  

## 2016-05-25 NOTE — Assessment & Plan Note (Signed)
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 She works at [employer].  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  Lives in [place] and drives 1 hr 1 way.  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 Has snoring, gasping, witnessed apneas,choking.  Gets sleepy driving.  Hypersomnia affects fxnality.  Gained 20 lbs over 6 mos.  ESS 15  Takes Hydrocodone 3-4x/day x 3 yrs. Takes valium every 3-4 weeks with 2nd shift.   We discussed about the diagnosis of Obstructive Sleep Apnea (OSA) and implications of untreated OSA. We discussed about CPAP and BiPaP as possible treatment options.    We will schedule the patient for a sleep study. Home sleep study this weekend. Need to expedite as pt is VERY SYMPTOMATIC. Need to R/O central apneas, OHS as well   Patient was instructed to call the office if he/she has not heard back from the office 1-2 weeks after the sleep study.   Patient was instructed to call the office if he/she is having issues with the PAP device.   We discussed good sleep hygiene.   Patient was advised not to engage in activities requiring concentration and/or vigilance if he/she is sleepy.  Patient was advised not to drive if he/she is sleepy.

## 2016-05-28 DIAGNOSIS — G4733 Obstructive sleep apnea (adult) (pediatric): Secondary | ICD-10-CM | POA: Diagnosis not present

## 2016-05-29 ENCOUNTER — Telehealth: Payer: Self-pay | Admitting: Pulmonary Disease

## 2016-05-29 DIAGNOSIS — G4733 Obstructive sleep apnea (adult) (pediatric): Secondary | ICD-10-CM

## 2016-05-29 NOTE — Telephone Encounter (Signed)
  Please call the pt and tell the pt the [place] mild  OSA.   Pt stops breathing  5  times an hour.   Please order autoCPAP 5-15 cm H2O. Patient will need a mask fitting session. Patient will need a 1 month download.   Patient needs to be seen by me or any of the NPs/APPs  4-6 weeks after obtaining the cpap machine. Let me know if you receive this.   Thanks!   J. Shirl Harris, MD 05/29/2016, 4:40 PM

## 2016-05-30 NOTE — Telephone Encounter (Signed)
LMTCB

## 2016-05-30 NOTE — Telephone Encounter (Signed)
Patient notified of Sleep Study results. Order entered for CPAP machine and mask fitting. Nothing further needed.

## 2016-05-30 NOTE — Telephone Encounter (Signed)
Patient returned call- states she works 3rd shift so if she can get a call back as soon as possible -prm

## 2016-05-31 ENCOUNTER — Telehealth: Payer: Self-pay | Admitting: Pulmonary Disease

## 2016-05-31 NOTE — Telephone Encounter (Signed)
[facility] home [facility] company(?), automated message said that a representative would be with me shortly and line ended.   Called back and had same encounter. wcb

## 2016-06-01 DIAGNOSIS — G4733 Obstructive sleep apnea (adult) (pediatric): Secondary | ICD-10-CM | POA: Diagnosis not present

## 2016-06-02 NOTE — Telephone Encounter (Signed)
Called and spoke with Harriett.  She said that she is not sure if they participate with patient's insurance company.  They will run the order through, but if they do not take the insurance, they will talk to patient and find out what company they will accept and send them the order.   Nothing further needed.

## 2016-06-05 ENCOUNTER — Other Ambulatory Visit: Payer: Self-pay | Admitting: *Deleted

## 2016-06-05 DIAGNOSIS — G4710 Hypersomnia, unspecified: Secondary | ICD-10-CM

## 2016-06-20 ENCOUNTER — Ambulatory Visit (HOSPITAL_BASED_OUTPATIENT_CLINIC_OR_DEPARTMENT_OTHER): Payer: 59 | Attending: Pulmonary Disease | Admitting: Pulmonary Disease

## 2016-06-20 DIAGNOSIS — G4733 Obstructive sleep apnea (adult) (pediatric): Secondary | ICD-10-CM

## 2016-06-20 DIAGNOSIS — R0683 Snoring: Secondary | ICD-10-CM

## 2016-06-21 ENCOUNTER — Other Ambulatory Visit: Payer: Self-pay | Admitting: Internal Medicine

## 2016-06-21 NOTE — Telephone Encounter (Signed)
Pt need new Rx for Hydrocodone °

## 2016-06-23 MED ORDER — HYDROCODONE-ACETAMINOPHEN 7.5-325 MG PO TABS: ORAL_TABLET | ORAL | Status: DC

## 2016-06-23 NOTE — Telephone Encounter (Signed)
Rx printed and put up front. 

## 2016-06-23 NOTE — Progress Notes (Signed)
[facility] PCCM  Patient had a mask fitting session with the Sleep Lab.  Monica Becton, MD 06/23/2016, 2:04 PM [place] [facility] Pager [phone] After 3 pm or if no answer, call [phone]

## 2016-06-26 ENCOUNTER — Telehealth: Payer: Self-pay | Admitting: Pulmonary Disease

## 2016-06-26 NOTE — Telephone Encounter (Signed)
Order was sent to [facility] home medical 05/30/16 LMTCB x1

## 2016-06-27 NOTE — Telephone Encounter (Signed)
Spoke with [facility] and they state that they do not accept [facility]. When I asked why we were not notified they state that they never received order. I advised that we received fax confirmation on 05/30/16. They do not know of another DME in that area that accepts [facility].   Spoke with M[place], [facility], and she states that [place] the pt's area and takes [facility]. Order sent to [facility] today and asked to be expedited.  Spoke with pt and advised as to current situation. Apologized that it has taken so long. Gave pt [facility]'s phone nbr and advised her that if she has not heard from them by next week to call us or them to follow up.   Nothing further needed at this time.

## 2016-06-27 NOTE — Telephone Encounter (Signed)
[phone] callingback

## 2016-06-27 NOTE — Telephone Encounter (Signed)
lmomtcb x 2  

## 2016-07-04 NOTE — Procedures (Signed)
    NAME: Lori [name] DATE OF BIRTH:  [birth year] MEDICAL RECORD NUMBER [id number]  LOCATION: [facility]  PHYSICIAN: Grand  DATE OF STUDY: 06/20/2016  This patient had a mask fitting session at the sleep lab.   Monica Becton, MD 07/04/2016, 2:52 PM [place] [facility] Pager [phone] After 3 pm or if no answer, call [phone]

## 2016-07-12 ENCOUNTER — Telehealth: Payer: Self-pay | Admitting: Pulmonary Disease

## 2016-07-12 NOTE — Telephone Encounter (Signed)
Pt has not received her CPAP machine but has received all the supplies. States that she was advised by [facility] that they needed the order signed by the physician before they would release any products.   Per Rodena Piety [facility], she has the CMN to be signed by AD, just waiting on his return to the office 07/13/16. Pt aware.  Dr Corrie Dandy returns to office tomorrow. Will send to Rodena Piety to ensure that this is taken care of tomorrow morning.

## 2016-07-14 NOTE — Telephone Encounter (Signed)
Rodena Piety, did he ever sign this CMN?

## 2016-07-14 NOTE — Telephone Encounter (Signed)
This form was signed and faxed on 07/13/2016

## 2016-07-17 ENCOUNTER — Telehealth: Payer: Self-pay | Admitting: Pulmonary Disease

## 2016-07-17 NOTE — Telephone Encounter (Signed)
Per previous phone note this was faxed on 07/13/2016. I just called [facility] in [place], [place] and they stated they did get the signed order on 07/13/2016 and that they will contact the patient

## 2016-07-17 NOTE — Telephone Encounter (Signed)
Lori [name] - have you received a CMN for this patient?  Please advise.

## 2016-08-02 ENCOUNTER — Telehealth: Payer: Self-pay | Admitting: Internal Medicine

## 2016-08-02 NOTE — Telephone Encounter (Signed)
Pt request refill  HYDROcodone-acetaminophen (NORCO) 7.5-325 MG tablet 120 tabs

## 2016-08-03 MED ORDER — HYDROCODONE-ACETAMINOPHEN 7.5-325 MG PO TABS: ORAL_TABLET | ORAL | 0 refills | Status: DC

## 2016-08-03 NOTE — Telephone Encounter (Signed)
Left message on personal voicemail Rx ready for pickup, will be at the front desk. Rx printed and signed by Dr. Sherren Mocha.

## 2016-08-08 ENCOUNTER — Encounter: Payer: Self-pay | Admitting: Family Medicine

## 2016-08-08 ENCOUNTER — Ambulatory Visit (INDEPENDENT_AMBULATORY_CARE_PROVIDER_SITE_OTHER): Payer: 59 | Admitting: Family Medicine

## 2016-08-08 VITALS — BP 126/90 | HR 92 | Temp 98.5°F | Resp 12 | Ht 65.0 in | Wt 198.0 lb

## 2016-08-08 DIAGNOSIS — J988 Other specified respiratory disorders: Secondary | ICD-10-CM

## 2016-08-08 DIAGNOSIS — J309 Allergic rhinitis, unspecified: Secondary | ICD-10-CM | POA: Diagnosis not present

## 2016-08-08 MED ORDER — AZITHROMYCIN 250 MG PO TABS: ORAL_TABLET | ORAL | 0 refills | Status: AC

## 2016-08-08 MED ORDER — PREDNISONE 20 MG PO TABS: 40.0000 mg | ORAL_TABLET | Freq: Every day | ORAL | 0 refills | Status: AC

## 2016-08-08 MED ORDER — BENZONATATE 100 MG PO CAPS: 200.0000 mg | ORAL_CAPSULE | Freq: Two times a day (BID) | ORAL | 0 refills | Status: AC | PRN

## 2016-08-08 MED ORDER — ALBUTEROL SULFATE HFA 108 (90 BASE) MCG/ACT IN AERS: 2.0000 | INHALATION_SPRAY | Freq: Four times a day (QID) | RESPIRATORY_TRACT | 0 refills | Status: DC

## 2016-08-08 NOTE — Progress Notes (Signed)
HPI:  ACUTE VISIT:  Chief Complaint  Patient presents with  . Cough    Started yesterday, no fever, coughing up chunks of phlegm. Started cpap machine a month ago. Pt knocked it over and all the water started going up her nose.    Lori [name] is a 50 y.o. female, who is here today complaining of a day of respiratory symptoms.   Productive cough with clear/greenish sputum, small amount.  No fever, chill, or myalgias. + Nasal congestion, rhinorrhea, sore throat, and bilateral earache. + Dysphonia.  No hearing loss.  Denies chest pain, dyspnea, or wheezing.  + Smoker. No Hx of recent travel. No sick contact. No known insect bite. +Hx of allergies, allergic rhinitis, she is on Flonase nasal spray as needed.Marland Kitchen  Hx of OSA, wearing CPAP. On 08/05/16 hose got loose and was dripping on her nose when she woke up, felt like she was choking, felt water going down through her throat.  OTC medications for this problem: None Symptoms otherwise stable.  Hx of lower back pain, according to pt, neurosurgeon recommended epidural injection and recommended coming to be seen because can not do it while she is sick.   Review of Systems  Constitutional: Positive for fatigue. Negative for appetite change and fever.  HENT: Positive for congestion, ear pain, postnasal drip, rhinorrhea, sinus pressure, sneezing, sore throat and voice change. Negative for ear discharge, hearing loss, mouth sores and trouble swallowing.   Eyes: Negative for discharge, redness and itching.  Respiratory: Positive for cough and chest tightness. Negative for shortness of breath and wheezing.   Cardiovascular: Negative for chest pain, palpitations and leg swelling.  Gastrointestinal: Negative for abdominal pain, diarrhea, nausea and vomiting.  Musculoskeletal: Positive for back pain. Negative for arthralgias, myalgias and neck pain.  Skin: Negative for color change and rash.  Allergic/Immunologic: Negative for  environmental allergies.  Neurological: Negative for syncope, weakness and headaches.  Hematological: Negative for adenopathy. Does not bruise/bleed easily.      Current Outpatient Prescriptions on File Prior to Visit  Medication Sig Dispense Refill  . Cholecalciferol (VITAMIN D-3) 1000 UNITS CAPS Take 1 capsule by mouth daily.    . diazepam (VALIUM) 5 MG tablet Take 1 tablet (5 mg total) by mouth at bedtime as needed. 90 tablet 1  . fluticasone (FLONASE) 50 MCG/ACT nasal spray USE 2 SPRAYS IN NOSE DAILY 16 g 6  . furosemide (LASIX) 20 MG tablet TAKE 1 TABLET BY MOUTH DAILY 90 tablet 0  . HYDROcodone-acetaminophen (NORCO) 7.5-325 MG tablet TAKE ONE TABLET BY MOUTH EVERY 6 HOURS AS NEEDED FOR PAIN 120 tablet 0  . ibuprofen (ADVIL,MOTRIN) 800 MG tablet Take 800 mg by mouth every 6 (six) hours as needed.   0  . loratadine (CLARITIN) 10 MG tablet Take 10 mg by mouth daily.   0  . Multiple Vitamin (MULTIVITAMIN) tablet Take 1 tablet by mouth daily.     No current facility-administered medications on file prior to visit.      Past Medical History:  Diagnosis Date  . ALLERGIC RHINITIS 03/14/2010  . BACK PAIN 01/24/2010  . GERD 12/01/2008  . HIATAL HERNIA 05/27/2010  . 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 OSTEOARTHRITIS 12/01/2008  . 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 TOBACCO USE 12/01/2008  . VITAMIN D DEFICIENCY 05/23/2010   Allergies  Allergen Reactions  . Amoxicillin Nausea Only    Yeast Infection  . Morphine     REACTION: hyper    Social  History   Social History  . Marital status: Married    Spouse name: N/A  . Number of children: N/A  . Years of education: N/A   Social History Main Topics  . Smoking status: Current Every Day Smoker    Packs/day: 1.00    Types: Cigarettes  . Smokeless tobacco: Never Used  . Alcohol use Yes     Comment: rarely  . Drug use: No  . Sexual activity: Not Asked   Other Topics Concern  . None   Social History Narrative  . None     Vitals:   08/08/16 1103  BP: 126/90  Pulse: 92  Resp: 12  Temp: 98.5 F (36.9 C)    O2 sat at RA 98%.  Body mass index is 32.95 kg/m.      Physical Exam  Nursing note and vitals reviewed. Constitutional: She is oriented to person, place, and time. She appears well-developed. She does not appear ill. No distress.  HENT:  Head: Atraumatic.  Right Ear: Tympanic membrane, external ear and ear canal normal.  Left Ear: External ear and ear canal normal. Tympanic membrane is not erythematous. A middle ear effusion is present.  Nose: Rhinorrhea present. Right sinus exhibits no maxillary sinus tenderness and no frontal sinus tenderness. Left sinus exhibits no maxillary sinus tenderness and no frontal sinus tenderness.  Mouth/Throat: Uvula is midline and mucous membranes are normal. Posterior oropharyngeal erythema present. No oropharyngeal exudate or posterior oropharyngeal edema.  Hypertrophic turbinates. Nasal voice. Mild dysphonia.   Eyes: Conjunctivae are normal.  Neck: No muscular tenderness present. No edema and no erythema present.  Cardiovascular: Normal rate and regular rhythm.   No murmur heard. Respiratory: Effort normal and breath sounds normal. No stridor. No respiratory distress.  Lymphadenopathy:       Head (right side): No submandibular adenopathy present.       Head (left side): No submandibular adenopathy present.    She has cervical adenopathy.       Right cervical: Posterior cervical adenopathy present.       Left cervical: Posterior cervical adenopathy present.  Neurological: She is alert and oriented to person, place, and time. She has normal strength. Coordination and gait normal.  Skin: Skin is warm. No rash noted. No erythema.  Psychiatric: Her speech is normal. Her mood appears anxious.  Well groomed, good eye contact.      ASSESSMENT AND PLAN:     Jiyana was seen today for cough.  Diagnoses and all orders for this visit:  Respiratory  tract infection  Symptoms suggests a viral etiology, I explained patient that symptomatic treatment is usually recommended because abx does not help. Because Hx of smoking + incident with CPAP machine, Rx for Azithromycin was sent. She could hold on abx for a couple days and start if fever or worsening symptoms. Albuterol inh 2 puff qid for a week then as needed.  Instructed to monitor for signs of complications and clearly instructed about warning signs. I also explained that cough and nasal congestion can last a few days and sometimes weeks, smoking cessation encouraged. Note for work given. F/U as needed.   -     azithromycin (ZITHROMAX) 250 MG tablet; 2 tabs today then 1 tab daily for 5 days. -     albuterol (PROVENTIL HFA;VENTOLIN HFA) 108 (90 Base) MCG/ACT inhaler; Inhale 2 puffs into the lungs every 6 (six) hours. -     benzonatate (TESSALON) 100 MG capsule; Take 2 capsules (200 mg total)  by mouth 2 (two) times daily as needed for cough.  Allergic rhinitis, unspecified allergic rhinitis type  Continue Flonase nasal spray as needed. Nasal irrigations with saline and steam inhalations may help. She has taken Prednisone in the past and has helped with similar symptoms. Explained it may help with acute nasal congestion/sinus pressure, some side effects discussed.  -     predniSONE (DELTASONE) 20 MG tablet; Take 2 tablets (40 mg total) by mouth daily with breakfast.        Return if symptoms worsen or fail to improve.     -Ms.Kelaiah Cacal Goins advised to return or notify a doctor immediately if symptoms worsen or persist or new concerns arise.       Calia Napp G. [place], MD  [facility]. [place] office.

## 2016-08-08 NOTE — Progress Notes (Signed)
Pre visit review using our clinic review tool, if applicable. No additional management support is needed unless otherwise documented below in the visit note. 

## 2016-08-08 NOTE — Patient Instructions (Addendum)
  [name] I have seen you today for an acute visit because your primary care provider was not available.   1. Allergic rhinitis, unspecified allergic rhinitis type Nasal saline drops. Continue Flonase nasal spray daily as needed.    - predniSONE (DELTASONE) 20 MG tablet; Take 2 tablets (40 mg total) by mouth daily with breakfast.  Dispense: 6 tablet; Refill: 0  2. Respiratory tract infection It is most likely viral but because provided history antibiotic was sent to pharmacy. You could hold on antibiotics for a couple days and start if fever of worsening symptoms, even though  Infections usually are worse for the first 4-5 days.  viral infections are self-limited and we treat each symptom depending of severity.  Over the counter medications as decongestants and cold medications usually help, they need to be taken with caution if there is a history of high blood pressure or palpitations. Tylenol and/or Ibuprofen also helps with most symptoms (headache, muscle aching, fever,etc) Plenty of fluids. Honey helps with cough. Steam inhalations helps with runny nose, nasal congestion, and may prevent sinus infections. Cough and nasal congestion could last a few days and sometimes weeks. Please follow in not any better in 1-2 weeks or if symptoms get worse.  Smoking causes persistent symptoms.   - azithromycin (ZITHROMAX) 250 MG tablet; 2 tabs today then 1 tab daily for 5 days.  Dispense: 6 tablet; Refill: 0 Albuterol 2 puffs every 6 hours for a week and benzonatate 1-2 capsules 3 times per day as needed for cough.   Monitor for signs of worsening symptoms and seek immediate medical attention if any concerning/warning symptom as we discussed.   If symptoms are not resolved in 1-2 weeks you should schedule a follow up appointment with your doctor, before if symptoms get worse.  Please be sure you have an appointment already scheduled with your PCP.

## 2016-08-10 ENCOUNTER — Ambulatory Visit (INDEPENDENT_AMBULATORY_CARE_PROVIDER_SITE_OTHER): Payer: 59 | Admitting: Pulmonary Disease

## 2016-08-10 ENCOUNTER — Encounter: Payer: Self-pay | Admitting: Pulmonary Disease

## 2016-08-10 DIAGNOSIS — J209 Acute bronchitis, unspecified: Secondary | ICD-10-CM | POA: Diagnosis not present

## 2016-08-10 DIAGNOSIS — F17200 Nicotine dependence, unspecified, uncomplicated: Secondary | ICD-10-CM | POA: Diagnosis not present

## 2016-08-10 DIAGNOSIS — G4733 Obstructive sleep apnea (adult) (pediatric): Secondary | ICD-10-CM | POA: Diagnosis not present

## 2016-08-10 MED ORDER — PREDNISONE 10 MG PO TABS: 20.0000 mg | ORAL_TABLET | Freq: Every day | ORAL | 0 refills | Status: AC

## 2016-08-10 NOTE — Assessment & Plan Note (Addendum)
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 She works at [employer].  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  Lives in [place] and drives 1 hr 1 way.  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 Has snoring, gasping, witnessed apneas,choking.  Gets sleepy driving.  Hypersomnia affects fxnality.  Gained 20 lbs over 6 mos.  ESS 15  Takes Hydrocodone 3-4x/day x 3 yrs. Takes valium every 3-4 weeks with 2nd shift.   HST (June 2017) AHI 5.  She started CPAP mid August. Feels better using it. More energy, less sleepiness. Unfortunately, she feels pressures too much. Recent sinus congestion and postnasal drip.  Plan : We extensively discussed the importance of treating OSA and the need to use PAP therapy.   Continue with cpap -- we will decrease the pressure to 5-10 cm water. Will need a 1 month download and compliance and need to be seen after that recent bronchitis and sinus congestion. Continue with prednisone for at least one week. Told her to decrease humidity as well. She has it set at 6. We need to have good compliance with her as she is very symptomatic, takes pain medications, the shift work, drives an hour to work 1 way.   Patient was instructed to have mask, tubings, filter, reservoir cleaned at least once a week with soapy water.  Patient was instructed to call the office if he/she is having issues with the PAP device.    I advised patient to obtain sufficient amount of sleep --  7 to 8 hours at least in a 24 hr period.  Patient was advised to follow good sleep hygiene.  Patient was advised NOT to engage in activities requiring concentration and/or vigilance if he/she is and  sleepy.  Patient is NOT to drive if he/she is sleepy.

## 2016-08-10 NOTE — Assessment & Plan Note (Signed)
Smoking cessation done.  

## 2016-08-10 NOTE — Patient Instructions (Signed)
It was a pleasure taking care of you today!   It was a pleasure taking care of you today!  Continue using your CPAP machine. We will try to cut down the pressure of her CPAP machine. Let us know if the pressure still too much.  Please make sure you use your CPAP device everytime you sleep.  We will monitor the usage of your machine per your insurance requirement.  Your insurance company may take the machine from you if you are not using it regularly.   Please clean the mask, tubings, filter, water reservoir with soapy water every week.  Please use distilled water for the water reservoir.   Please call the office or your machine provider (DME company) if you are having issues with the device.   Finish off prednisone. We will prescribe you 20 mg a day for 1 week.  Return to clinic in 4-6 weeks with Dr. Corrie Dandy or NP

## 2016-08-10 NOTE — Progress Notes (Signed)
Subjective:    Patient ID: Lori [name], female    DOB: 1966/03/02, 50 y.o.   MRN: [id number]  HPI   This is the case of Lori [name], 50 y.o. Female, who was referred by Dr. Bluford Kaufmann in consultation regarding possible OSA.   As you very well know, patient has a 30PY smoking history, smokes 1PPD.  Not known to have asthma or copd.  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 She works at [employer].  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  Lives in [place] and drives 1 hr 1 way.  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 Has snoring, gasping, witnessed apneas,choking.  Gets sleepy driving.  Hypersomnia affects fxnality.  Gained 20 lbs over 6 mos.  Takes Hydrocodone 3-4x/day x 3 yrs. Takes valium every 3-4 weeks with 2nd shift.   ROV 08/10/16 Patient is here for follow-up on her sleep apnea. Since last seen, she had a home sleep test which showed an AHI of 5. (June 2017). She was started on auto CPAP August 12. Since that time, she has nasal congestion and postnasal drip, cough. Download shows her AHI was 2. Pressures too much. Also with recent nasal congestion and bronchitis. Not sure if it's related to CPAP or recent sick exposure. Better with prednisone. Continues to smoke a pack a day.   Review of Systems  Constitutional: Negative.  Negative for fever and unexpected weight change.  HENT: Positive for congestion and rhinorrhea. Negative for dental problem, ear pain, nosebleeds, postnasal drip, sinus pressure, sneezing, sore throat and trouble swallowing.   Eyes: Negative.  Negative for redness and itching.  Respiratory: Negative.  Negative for cough, chest tightness, shortness of breath and wheezing.   Cardiovascular: Positive for leg swelling. Negative for palpitations.  Gastrointestinal: Negative.  Negative for nausea and vomiting.  Endocrine: Negative.   Genitourinary: Negative.  Negative for dysuria.    Musculoskeletal: Positive for arthralgias, back pain and joint swelling.  Skin: Negative.  Negative for rash.  Allergic/Immunologic: Positive for environmental allergies.  Neurological: Positive for headaches.  Hematological: Negative.  Does not bruise/bleed easily.  Psychiatric/Behavioral: Negative.  Negative for dysphoric mood. The patient is not nervous/anxious.       Objective:   Physical Exam  Vitals:  Vitals:   08/10/16 1525  BP: (!) 128/92  Pulse: 88  SpO2: 96%  Weight: 196 lb (88.9 kg)  Height: 5\' 5"  (1.651 m)    Constitutional/General:  Pleasant, well-nourished, well-developed, not in any distress,  Comfortably seating.  Well kempt  Body mass index is 32.62 kg/m. Wt Readings from Last 3 Encounters:  08/10/16 196 lb (88.9 kg)  08/08/16 198 lb (89.8 kg)  05/25/16 197 lb (89.4 kg)      HEENT: Pupils equal and reactive to light and accommodation. Anicteric sclerae. Normal nasal mucosa.   No oral  lesions,  mouth clear,  oropharynx clear, no postnasal drip. (-) Oral thrush. No dental caries.  Airway - Mallampati class III-IV  Neck: No masses. Midline trachea. No JVD, (-) LAD. (-) bruits appreciated.  Respiratory/Chest: Grossly normal chest. (-) deformity. (-) Accessory muscle use.  Symmetric expansion. (-) Tenderness on palpation.  Resonant on percussion.  Diminished BS on both lower lung zones. (-) wheezing, crackles, rhonchi (-) egophony  Cardiovascular: Regular rate and  rhythm, heart sounds normal, no murmur or gallops, no peripheral edema  Gastrointestinal:  Normal bowel sounds. Soft, non-tender. No hepatosplenomegaly.  (-)  masses.   Musculoskeletal:  Normal muscle tone. Normal gait.   Extremities: Grossly normal. (-) clubbing, cyanosis.  (-) edema  Skin: (-) rash,lesions seen.   Neurological/Psychiatric : alert, oriented to time, place, person. Normal mood and affect            Assessment & Plan:  OSA (obstructive sleep apnea) Pt  does shift work -- 2 weeks 3rd, 2 weeks 2nd, then 2 weeks 1st. She works at [employer].  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  Lives in [place] and drives 1 hr 1 way.  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 Has snoring, gasping, witnessed apneas,choking.  Gets sleepy driving.  Hypersomnia affects fxnality.  Gained 20 lbs over 6 mos.  ESS 15  Takes Hydrocodone 3-4x/day x 3 yrs. Takes valium every 3-4 weeks with 2nd shift.   HST (June 2017) AHI 5.  She started CPAP mid August. Feels better using it. More energy, less sleepiness. Unfortunately, she feels pressures too much. Recent sinus congestion and postnasal drip.  Plan : We extensively discussed the importance of treating OSA and the need to use PAP therapy.   Continue with cpap -- we will decrease the pressure to 5-10 cm water. Will need a 1 month download and compliance and need to be seen after that recent bronchitis and sinus congestion. Continue with prednisone for at least one week. Told her to decrease humidity as well. She has it set at 6. We need to have good compliance with her as she is very symptomatic, takes pain medications, the shift work, drives an hour to work 1 way.   Patient was instructed to have mask, tubings, filter, reservoir cleaned at least once a week with soapy water.  Patient was instructed to call the office if he/she is having issues with the PAP device.    I advised patient to obtain sufficient amount of sleep --  7 to 8 hours at least in a 24 hr period.  Patient was advised to follow good sleep hygiene.  Patient was advised NOT to engage in activities requiring concentration and/or vigilance if he/she is and  sleepy.  Patient is NOT to drive if he/she is sleepy.      TOBACCO USE Smoking cessation done.   Acute bronchitis Recent bronchitis and congestion and sinus issue with CPAP use and possible sick contact. On her 3rd day of prednisone. I think she needs one  more week, 20 mg/d a day. Z-Pak has been ordered as well. Told patient to give [name] a call if she is not better in a couple days.    Patient will follow up with me in 6-8 weeks.     Monica Becton, MD 08/10/2016   3:50 PM [facility] Pager: [phone] Office: [phone], Fax: [phone]

## 2016-08-10 NOTE — Assessment & Plan Note (Signed)
Recent bronchitis and congestion and sinus issue with CPAP use and possible sick contact. On her 3rd day of prednisone. I think she needs one more week, 20 mg/d a day. Z-Pak has been ordered as well. Told patient to give [name] a call if she is not better in a couple days.

## 2016-08-22 ENCOUNTER — Telehealth: Payer: Self-pay | Admitting: Internal Medicine

## 2016-08-22 DIAGNOSIS — E559 Vitamin D deficiency, unspecified: Secondary | ICD-10-CM

## 2016-08-22 NOTE — Telephone Encounter (Signed)
Pt is on vit d pills and would like to have her vit d recheck with her cpx labs on 10-06-16. Can I sch?

## 2016-08-22 NOTE — Telephone Encounter (Signed)
lmom for pt to call back

## 2016-08-22 NOTE — Telephone Encounter (Signed)
Yes, you can schedule I already put order for Vit D in [facility].

## 2016-08-23 NOTE — Telephone Encounter (Signed)
Pt is aware.  

## 2016-09-04 ENCOUNTER — Encounter: Payer: Self-pay | Admitting: Pulmonary Disease

## 2016-09-08 ENCOUNTER — Telehealth: Payer: Self-pay | Admitting: Adult Health

## 2016-09-08 NOTE — Telephone Encounter (Signed)
ATC pt, received message unable to leave VM d/t being no space. WCB

## 2016-09-11 ENCOUNTER — Encounter: Payer: Self-pay | Admitting: Adult Health

## 2016-09-11 ENCOUNTER — Ambulatory Visit (INDEPENDENT_AMBULATORY_CARE_PROVIDER_SITE_OTHER): Payer: 59 | Admitting: Adult Health

## 2016-09-11 VITALS — BP 118/86 | HR 91 | Temp 98.9°F | Ht 65.0 in | Wt 200.0 lb

## 2016-09-11 DIAGNOSIS — G4733 Obstructive sleep apnea (adult) (pediatric): Secondary | ICD-10-CM

## 2016-09-11 NOTE — Progress Notes (Signed)
Subjective:    Patient ID: SHERISSA GRAVELY, female    DOB: [birth year], 50 y.o.   MRN: [id number]  HPI 50 year old female followed for mild OSA on CPAP   TEST  HST 05/2016 AHI 5   09/11/2016 follow-up sleep apnea Patient returns for a one-month follow-up. Patient says she is doing better on C Pap. Last visit. Patient's pressures were adjusted. Feels like to decrease in pressure has helped her be more compliant. She says she is wearing her C Pap each night about 4-5 hours.. She says it is helping her daytime sleepiness. She denies any chest pain, orthopnea, PND or leg swelling. Download was requested   Past Medical History:  Diagnosis Date  . ALLERGIC RHINITIS 03/14/2010  . BACK PAIN 01/24/2010  . GERD 12/01/2008  . HIATAL HERNIA 05/27/2010  . 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 OSTEOARTHRITIS 12/01/2008  . 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 TOBACCO USE 12/01/2008  . VITAMIN D DEFICIENCY 05/23/2010    Current Outpatient Prescriptions on File Prior to Visit  Medication Sig Dispense Refill  . albuterol (PROVENTIL HFA;VENTOLIN HFA) 108 (90 Base) MCG/ACT inhaler Inhale 2 puffs into the lungs every 6 (six) hours. 1 Inhaler 0  . Cholecalciferol (VITAMIN D-3) 1000 UNITS CAPS Take 1 capsule by mouth daily.    . cyclobenzaprine (FLEXERIL) 5 MG tablet Take 1 tablet by mouth as needed.    . diazepam (VALIUM) 5 MG tablet Take 1 tablet (5 mg total) by mouth at bedtime as needed. 90 tablet 1  . fluticasone (FLONASE) 50 MCG/ACT nasal spray USE 2 SPRAYS IN NOSE DAILY 16 g 6  . furosemide (LASIX) 20 MG tablet TAKE 1 TABLET BY MOUTH DAILY 90 tablet 0  . HYDROcodone-acetaminophen (NORCO) 7.5-325 MG tablet TAKE ONE TABLET BY MOUTH EVERY 6 HOURS AS NEEDED FOR PAIN 120 tablet 0  . ibuprofen (ADVIL,MOTRIN) 800 MG tablet Take 800 mg by mouth every 6 (six) hours as needed.   0  . Multiple Vitamin (MULTIVITAMIN) tablet Take 1 tablet by mouth daily.    Marland Kitchen loratadine (CLARITIN) 10 MG tablet Take 10 mg by mouth daily.    0   No current facility-administered medications on file prior to visit.      Review of Systems Constitutional:   No  weight loss, night sweats,  Fevers, chills, fatigue, or  lassitude.  HEENT:   No headaches,  Difficulty swallowing,  Tooth/dental problems, or  Sore throat,                No sneezing, itching, ear ache, nasal congestion, post nasal drip,   CV:  No chest pain,  Orthopnea, PND, swelling in lower extremities, anasarca, dizziness, palpitations, syncope.   GI  No heartburn, indigestion, abdominal pain, nausea, vomiting, diarrhea, change in bowel habits, loss of appetite, bloody stools.   Resp: No shortness of breath with exertion or at rest.  No excess mucus, no productive cough,  No non-productive cough,  No coughing up of blood.  No change in color of mucus.  No wheezing.  No chest wall deformity  Skin: no rash or lesions.  GU: no dysuria, change in color of urine, no urgency or frequency.  No flank pain, no hematuria   MS:  No joint pain or swelling.  No decreased range of motion.  No back pain.  Psych:  No change in mood or affect. No depression or anxiety.  No memory loss.         Objective:   Physical  Exam Vitals:   09/11/16 1206  BP: 118/86  Pulse: 91  Temp: 98.9 F (37.2 C)  TempSrc: Oral  SpO2: 97%  Weight: 200 lb (90.7 kg)  Height: 5\' 5"  (1.651 m)   Body mass index is 33.28 kg/m.   GEN: A/Ox3; pleasant , NAD,  Obese    HEENT:  [place]/AT,  EACs-clear, TMs-wnl, NOSE-clear, THROAT-clear, no lesions, no postnasal drip or exudate noted. Class 2-3 MP airway   NECK:  Supple w/ fair ROM; no JVD; normal carotid impulses w/o bruits; no thyromegaly or nodules palpated; no lymphadenopathy.    RESP  Clear  P & A; w/o, wheezes/ rales/ or rhonchi. no accessory muscle use, no dullness to percussion  CARD:  RRR, no m/r/g  , no peripheral edema, pulses intact, no cyanosis or clubbing.  GI:   Soft & nt; nml bowel sounds; no organomegaly or masses detected.    Musco: Warm bil, no deformities or joint swelling noted.   Neuro: alert, no focal deficits noted.    Skin: Warm, no lesions or rashes  Sivan Quast NP-C  [facility]  09/11/2016

## 2016-09-11 NOTE — Assessment & Plan Note (Signed)
Mild OSA controlled on CPAP   Plan   Patient Instructions  Continue on CPAP At bedtime   CPAP download.  Do not drive if sleepy .  Continue to work on quitting smoking  Follow up with Dr. Murlean Iba in 4-6 months and As needed

## 2016-09-11 NOTE — Patient Instructions (Addendum)
Continue on CPAP At bedtime   CPAP download.  Do not drive if sleepy .  Continue to work on quitting smoking  Follow up with Dr. Murlean Iba in 4-6 months and As needed

## 2016-09-12 NOTE — Telephone Encounter (Signed)
We have already spoken with the pt about this matter. Nothing further was needed.

## 2016-09-13 ENCOUNTER — Telehealth: Payer: Self-pay | Admitting: Internal Medicine

## 2016-09-13 NOTE — Telephone Encounter (Signed)
Pt need new Rx for Hydrocodone and diazepam

## 2016-09-14 MED ORDER — HYDROCODONE-ACETAMINOPHEN 7.5-325 MG PO TABS: ORAL_TABLET | ORAL | 0 refills | Status: DC

## 2016-09-14 MED ORDER — DIAZEPAM 5 MG PO TABS: 5.0000 mg | ORAL_TABLET | Freq: Every evening | ORAL | 0 refills | Status: DC | PRN

## 2016-09-14 NOTE — Telephone Encounter (Signed)
Pt notified Rx's ready for pickup will be at the front desk. Rx's printed and signed by Dr. Elease Hashimoto.

## 2016-09-26 ENCOUNTER — Other Ambulatory Visit: Payer: Self-pay | Admitting: Obstetrics & Gynecology

## 2016-09-27 ENCOUNTER — Inpatient Hospital Stay (HOSPITAL_COMMUNITY)
Admission: RE | Admit: 2016-09-27 | Discharge: 2016-09-27 | Disposition: A | Discharge status: Home / Self Care | Payer: 59 | Source: Ambulatory Visit

## 2016-10-06 ENCOUNTER — Other Ambulatory Visit (INDEPENDENT_AMBULATORY_CARE_PROVIDER_SITE_OTHER): Payer: 59

## 2016-10-06 DIAGNOSIS — E559 Vitamin D deficiency, unspecified: Secondary | ICD-10-CM

## 2016-10-06 DIAGNOSIS — Z0000 Encounter for general adult medical examination without abnormal findings: Secondary | ICD-10-CM | POA: Diagnosis not present

## 2016-10-06 LAB — CBC WITH DIFFERENTIAL/PLATELET
Basophils Absolute: 0 10*3/uL (ref 0.0–0.1)
Basophils Relative: 0.3 % (ref 0.0–3.0)
Eosinophils Absolute: 0.1 10*3/uL (ref 0.0–0.7)
Eosinophils Relative: 1.2 % (ref 0.0–5.0)
HCT: 47.1 % — ABNORMAL HIGH (ref 36.0–46.0)
Hemoglobin: 16 g/dL — ABNORMAL HIGH (ref 12.0–15.0)
Lymphocytes Relative: 37.3 % (ref 12.0–46.0)
Lymphs Abs: 4.2 10*3/uL — ABNORMAL HIGH (ref 0.7–4.0)
MCHC: 34.1 g/dL (ref 30.0–36.0)
MCV: 96.1 fl (ref 78.0–100.0)
Monocytes Absolute: 0.5 10*3/uL (ref 0.1–1.0)
Monocytes Relative: 4.2 % (ref 3.0–12.0)
Neutro Abs: 6.5 10*3/uL (ref 1.4–7.7)
Neutrophils Relative %: 57 % (ref 43.0–77.0)
Platelets: 208 10*3/uL (ref 150.0–400.0)
RBC: 4.9 Mil/uL (ref 3.87–5.11)
RDW: 14.6 % (ref 11.5–15.5)
WBC: 11.4 10*3/uL — ABNORMAL HIGH (ref 4.0–10.5)

## 2016-10-06 LAB — HEPATIC FUNCTION PANEL
ALT: 26 U/L (ref 0–35)
AST: 17 U/L (ref 0–37)
Albumin: 4.7 g/dL (ref 3.5–5.2)
Alkaline Phosphatase: 57 U/L (ref 39–117)
Bilirubin, Direct: 0.1 mg/dL (ref 0.0–0.3)
Total Bilirubin: 0.5 mg/dL (ref 0.2–1.2)
Total Protein: 7.5 g/dL (ref 6.0–8.3)

## 2016-10-06 LAB — BASIC METABOLIC PANEL
BUN: 14 mg/dL (ref 6–23)
CO2: 26 mEq/L (ref 19–32)
Calcium: 10.1 mg/dL (ref 8.4–10.5)
Chloride: 105 mEq/L (ref 96–112)
Creatinine, Ser: 0.82 mg/dL (ref 0.40–1.20)
GFR: 77.92 mL/min (ref >60.00)
Glucose, Bld: 87 mg/dL (ref 70–99)
Potassium: 4 mEq/L (ref 3.5–5.1)
Sodium: 140 mEq/L (ref 135–145)

## 2016-10-06 LAB — LIPID PANEL
Cholesterol: 222 mg/dL — ABNORMAL HIGH (ref 0–200)
HDL: 55 mg/dL (ref >39.00)
LDL Cholesterol: 139 mg/dL — ABNORMAL HIGH (ref 0–99)
NonHDL: 167.22
Total CHOL/HDL Ratio: 4
Triglycerides: 139 mg/dL (ref 0.0–149.0)
VLDL: 27.8 mg/dL (ref 0.0–40.0)

## 2016-10-06 LAB — POC URINALSYSI DIPSTICK (AUTOMATED)
Bilirubin, UA: NEGATIVE
Glucose, UA: NEGATIVE
Ketones, UA: NEGATIVE
Leukocytes, UA: NEGATIVE
Nitrite, UA: NEGATIVE
Protein, UA: NEGATIVE
Spec Grav, UA: <=1.005
Urobilinogen, UA: 0.2
pH, UA: 6.5

## 2016-10-09 LAB — VITAMIN D 25 HYDROXY (VIT D DEFICIENCY, FRACTURES): VITD: 29.32 ng/mL — ABNORMAL LOW (ref 30.00–100.00)

## 2016-10-09 LAB — TSH: TSH: 1.1 u[IU]/mL (ref 0.35–4.50)

## 2016-10-10 ENCOUNTER — Other Ambulatory Visit: Payer: Self-pay | Admitting: Obstetrics & Gynecology

## 2016-10-11 NOTE — Patient Instructions (Addendum)
Your procedure is scheduled on:  Tuesday, Nov. 7, 2017  Enter through the [employer] [facility] at:  8:45 AM  Pick up the phone at the desk and dial [phone].  Call this number if you have problems the morning of surgery: [phone].  Remember: Do NOT eat food or drink after:  Midnight Monday  Take these medicines the morning of surgery with a SIP OF WATER:  None  Bring Asthma Inhaler day of surgery  Stop ALL herbal medications at this time  Do NOT smoke the day of surgery  Do NOT wear jewelry (body piercing), metal hair clips/bobby pins, make-up, or nail polish. Do NOT wear lotions, powders, or perfumes.  You may wear deodorant. Do NOT shave for 48 hours prior to surgery. Do NOT bring valuables to the hospital. Contacts, dentures, or bridgework may not be worn into surgery.  Leave suitcase in car.  After surgery it may be brought to your room.  For patients admitted to the hospital, checkout time is 11:00 AM the day of discharge.  Bring CPAP machine day of surgery

## 2016-10-12 ENCOUNTER — Encounter (HOSPITAL_COMMUNITY): Payer: Self-pay

## 2016-10-12 ENCOUNTER — Encounter (HOSPITAL_COMMUNITY)
Admission: RE | Admit: 2016-10-12 | Discharge: 2016-10-12 | Disposition: A | Discharge status: Home / Self Care | Payer: 59 | Source: Ambulatory Visit | Attending: Obstetrics & Gynecology | Admitting: Obstetrics & Gynecology

## 2016-10-12 DIAGNOSIS — Z01812 Encounter for preprocedural laboratory examination: Secondary | ICD-10-CM | POA: Diagnosis present

## 2016-10-12 HISTORY — DX: Myalgia, other site: M79.18

## 2016-10-12 HISTORY — DX: Anesthesia of skin: R20.0

## 2016-10-12 HISTORY — DX: Sleep apnea, unspecified: G47.30

## 2016-10-12 HISTORY — DX: Pain in unspecified thigh: M79.659

## 2016-10-12 HISTORY — DX: Anesthesia of skin: R20.2

## 2016-10-12 LAB — CBC
HCT: 44.6 % (ref 36.0–46.0)
Hemoglobin: 15.5 g/dL — ABNORMAL HIGH (ref 12.0–15.0)
MCH: 33.3 pg (ref 26.0–34.0)
MCHC: 34.8 g/dL (ref 30.0–36.0)
MCV: 95.9 fL (ref 78.0–100.0)
Platelets: 236 10*3/uL (ref 150–400)
RBC: 4.65 MIL/uL (ref 3.87–5.11)
RDW: 14.3 % (ref 11.5–15.5)
WBC: 13.1 10*3/uL — ABNORMAL HIGH (ref 4.0–10.5)

## 2016-10-12 LAB — TYPE AND SCREEN
ABO/RH(D): O NEG
Antibody Screen: NEGATIVE

## 2016-10-12 LAB — ABO/RH: ABO/RH(D): O NEG

## 2016-10-13 ENCOUNTER — Encounter: Payer: Self-pay | Admitting: Internal Medicine

## 2016-10-13 ENCOUNTER — Ambulatory Visit (INDEPENDENT_AMBULATORY_CARE_PROVIDER_SITE_OTHER): Payer: 59 | Admitting: Internal Medicine

## 2016-10-13 VITALS — BP 126/80 | HR 94 | Temp 98.0°F | Resp 20 | Ht 64.25 in | Wt 199.2 lb

## 2016-10-13 DIAGNOSIS — Z0000 Encounter for general adult medical examination without abnormal findings: Secondary | ICD-10-CM | POA: Diagnosis not present

## 2016-10-13 NOTE — Progress Notes (Signed)
Subjective:    Patient ID: Lori [name], female    DOB: 03/02/1966, 50 y.o.   MRN: [id number]  HPI  50 year old patient who is seen today for a preventive health examination .  Since her last exam.  She has been diagnosed with OSA and now is on CPAP and followed by pulmonary medicine .  She has a history of cervical lumbar degenerative disc disease and is followed by Dr. Ellene Route.   She is scheduled for elective hysterectomy next week  Past Medical History:  Diagnosis Date  . ALLERGIC RHINITIS 03/14/2010  . BACK PAIN 01/24/2010  . Buttock pain   . GERD 12/01/2008  . HIATAL HERNIA 05/27/2010  . 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 Numbness and tingling of left upper and lower extremity   . OSTEOARTHRITIS 12/01/2008  . Sleep apnea   . Thigh pain   . 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 TOBACCO USE 12/01/2008  . VITAMIN D DEFICIENCY 05/23/2010     Social History   Social History  . Marital status: Legally Separated    Spouse name: N/A  . Number of children: N/A  . Years of education: N/A   Occupational History  . Not on file.   Social History Main Topics  . Smoking status: Current Every Day Smoker    Packs/day: 1.00    Years: 31.00    Types: Cigarettes  . Smokeless tobacco: Never Used  . Alcohol use Yes     Comment: social  . Drug use: No  . Sexual activity: Not on file   Other Topics Concern  . Not on file   Social History Narrative  . No narrative on file    Past Surgical History:  Procedure Laterality Date  . APPENDECTOMY    . BREAST SURGERY     lumpectomy  . CERVICAL FUSION    . CESAREAN SECTION    . COLONOSCOPY    . DILATION AND CURETTAGE OF UTERUS     miscarriage  . ESOPHAGEAL MANOMETRY    . ESOPHAGOGASTRODUODENOSCOPY    . LAPAROSCOPIC NISSEN FUNDOPLICATION    . LUMBAR DISC SURGERY    . LUMBAR LAMINECTOMY    . TONSILLECTOMY AND ADENOIDECTOMY      No family history on file.  Allergies  Allergen Reactions  . Amoxicillin Nausea Only    Yeast  Infection Has patient had a PCN reaction causing immediate rash, facial/tongue/throat swelling, SOB or lightheadedness with hypotension: no Has patient had a PCN reaction causing severe rash involving mucus membranes or skin necrosis: no Has patient had a PCN reaction that required hospitalization yes Has patient had a PCN reaction occurring within the last 10 years: yes If all of the above answers are "NO", then may proceed with Cephalosporin use.   Marland Kitchen Morphine     REACTION: hyper  . Torecan [Thiethylperazine]     Current Outpatient Prescriptions on File Prior to Visit  Medication Sig Dispense Refill  . Biotin w/ Vitamins C & E (HAIR/SKIN/NAILS PO) Take 1 tablet by mouth every morning.    . Cholecalciferol (VITAMIN D-3) 1000 UNITS CAPS Take 1 capsule by mouth daily.    . diazepam (VALIUM) 5 MG tablet Take 1 tablet (5 mg total) by mouth at bedtime as needed. (Patient taking differently: Take 5 mg by mouth at bedtime as needed for anxiety. ) 90 tablet 0  . fluticasone (FLONASE) 50 MCG/ACT nasal spray USE 2 SPRAYS IN NOSE DAILY 16 g 6  . furosemide (LASIX) 20 MG  tablet TAKE 1 TABLET BY MOUTH DAILY 90 tablet 0  . HYDROcodone-acetaminophen (NORCO) 7.5-325 MG tablet TAKE ONE TABLET BY MOUTH EVERY 6 HOURS AS NEEDED FOR PAIN 120 tablet 0  . ibuprofen (ADVIL,MOTRIN) 800 MG tablet Take 800 mg by mouth every 6 (six) hours as needed for moderate pain.   0  . Multiple Vitamin (MULTIVITAMIN) tablet Take 1 tablet by mouth daily.    Marland Kitchen albuterol (PROVENTIL HFA;VENTOLIN HFA) 108 (90 Base) MCG/ACT inhaler Inhale 2 puffs into the lungs every 6 (six) hours. 1 Inhaler 0   No current facility-administered medications on file prior to visit.     BP 126/80 (BP Location: Left Arm, Patient Position: Sitting, Cuff Size: Large)   Pulse 94   Temp 98 F (36.7 C) (Oral)   Resp 20   Ht 5' 4.25" (1.632 m)   Wt 199 lb 4 oz (90.4 kg)   LMP 09/27/2016 (Approximate)   SpO2 97%   BMI 33.94 kg/m       Review of  Systems  Constitutional: Negative for appetite change, fatigue, fever and unexpected weight change.  HENT: Negative for congestion, dental problem, ear pain, hearing loss, mouth sores, nosebleeds, sinus pressure, sore throat, tinnitus, trouble swallowing and voice change.   Eyes: Negative for photophobia, pain, redness and visual disturbance.  Respiratory: Negative for cough, chest tightness and shortness of breath.   Cardiovascular: Negative for chest pain, palpitations and leg swelling.  Gastrointestinal: Negative for abdominal distention, abdominal pain, blood in stool, constipation, diarrhea, nausea, rectal pain and vomiting.  Genitourinary: Negative for difficulty urinating, dysuria, flank pain, frequency, genital sores, hematuria, menstrual problem, pelvic pain, urgency, vaginal bleeding, vaginal discharge and vaginal pain.  Musculoskeletal: Positive for back pain and neck pain. Negative for arthralgias and neck stiffness.  Skin: Negative for rash.  Neurological: Positive for numbness. Negative for dizziness, syncope, speech difficulty, weakness, light-headedness and headaches.  Hematological: Negative for adenopathy. Does not bruise/bleed easily.  Psychiatric/Behavioral: Negative for agitation, behavioral problems, dysphoric mood, self-injury and suicidal ideas. The patient is not nervous/anxious.        Objective:   Physical Exam  Constitutional: She is oriented to person, place, and time. She appears well-developed and well-nourished.  HENT:  Head: Normocephalic and atraumatic.  Right Ear: External ear normal.  Left Ear: External ear normal.  Mouth/Throat: Oropharynx is clear and moist.  Eyes: Conjunctivae and EOM are normal.  Neck: Normal range of motion. Neck supple. No JVD present. No thyromegaly present.  2.  Surgical scar is anterior neck  Cardiovascular: Normal rate, regular rhythm, normal heart sounds and intact distal pulses.   No murmur heard. Pulmonary/Chest: Effort  normal and breath sounds normal. She has no wheezes. She has no rales.  Abdominal: Soft. Bowel sounds are normal. She exhibits no distension and no mass. There is no tenderness. There is no rebound and no guarding.  Lower abdominal surgical scar  Musculoskeletal: Normal range of motion. She exhibits no edema or tenderness.  Neurological: She is alert and oriented to person, place, and time. She has normal reflexes. No cranial nerve deficit. She exhibits normal muscle tone. Coordination normal.  Skin: Skin is warm and dry. No rash noted.  Psychiatric: She has a normal mood and affect. Her behavior is normal.          Assessment & Plan:   .  Preventive health examination .  Exogenous obesity/, OSA, weight loss encouraged.  Pulmonary follow-up  .  Vitamin D deficiency.  We'll continue vitamin D  supplementation , allergic rhinitis, stable  tobacco use disorder.  Total smoking cessation encouraged   medicines updated  laboratory studies reviewed .  Follow-up one year or as needed  Nyoka Cowden

## 2016-10-13 NOTE — Progress Notes (Signed)
Pre visit review using our clinic review tool, if applicable. No additional management support is needed unless otherwise documented below in the visit note. 

## 2016-10-13 NOTE — Patient Instructions (Addendum)
Limit your sodium (Salt) intake  Please check your blood pressure on a regular basis.  If it is consistently greater than 150/90, please make an office appointment.    It is important that you exercise regularly, at least 20 minutes 3 to 4 times per week.  If you develop chest pain or shortness of breath seek  medical attention.  You need to lose weight.  Consider a lower calorie diet and regular exercise.  Smoking tobacco is very bad for your health. You should stop smoking immediately.  Menopause is a normal process in which your reproductive ability comes to an end. This process happens gradually over a span of months to years, usually between the ages of 76 and 43. Menopause is complete when you have missed 12 consecutive menstrual periods. It is important to talk with your health care provider about some of the most common conditions that affect postmenopausal women, such as heart disease, cancer, and bone loss (osteoporosis). Adopting a healthy lifestyle and getting preventive care can help to promote your health and wellness. Those actions can also lower your chances of developing some of these common conditions. WHAT SHOULD I KNOW ABOUT MENOPAUSE? During menopause, you may experience a number of symptoms, such as:  Moderate-to-severe hot flashes.  Night sweats.  Decrease in sex drive.  Mood swings.  Headaches.  Tiredness.  Irritability.  Memory problems.  Insomnia. Choosing to treat or not to treat menopausal changes is an individual decision that you make with your health care provider. WHAT SHOULD I KNOW ABOUT HORMONE REPLACEMENT THERAPY AND SUPPLEMENTS? Hormone therapy products are effective for treating symptoms that are associated with menopause, such as hot flashes and night sweats. Hormone replacement carries certain risks, especially as you become older. If you are thinking about using estrogen or estrogen with progestin treatments, discuss the benefits and risks  with your health care provider. WHAT SHOULD I KNOW ABOUT HEART DISEASE AND STROKE? Heart disease, heart attack, and stroke become more likely as you age. This may be due, in part, to the hormonal changes that your body experiences during menopause. These can affect how your body processes dietary fats, triglycerides, and cholesterol. Heart attack and stroke are both medical emergencies. There are many things that you can do to help prevent heart disease and stroke:  Have your blood pressure checked at least every 1-2 years. High blood pressure causes heart disease and increases the risk of stroke.  If you are 55-65 years old, ask your health care provider if you should take aspirin to prevent a heart attack or a stroke.  Do not use any tobacco products, including cigarettes, chewing tobacco, or electronic cigarettes. If you need help quitting, ask your health care provider.  It is important to eat a healthy diet and maintain a healthy weight.  Be sure to include plenty of vegetables, fruits, low-fat dairy products, and lean protein.  Avoid eating foods that are high in solid fats, added sugars, or salt (sodium).  Get regular exercise. This is one of the most important things that you can do for your health.  Try to exercise for at least 150 minutes each week. The type of exercise that you do should increase your heart rate and make you sweat. This is known as moderate-intensity exercise.  Try to do strengthening exercises at least twice each week. Do these in addition to the moderate-intensity exercise.  Know your numbers.Ask your health care provider to check your cholesterol and your blood glucose. Continue to  have your blood tested as directed by your health care provider. WHAT SHOULD I KNOW ABOUT CANCER SCREENING? There are several types of cancer. Take the following steps to reduce your risk and to catch any cancer development as early as possible. Breast Cancer  Practice breast  self-awareness.  This means understanding how your breasts normally appear and feel.  It also means doing regular breast self-exams. Let your health care provider know about any changes, no matter how small.  If you are 63 or older, have a clinician do a breast exam (clinical breast exam or CBE) every year. Depending on your age, family history, and medical history, it may be recommended that you also have a yearly breast X-ray (mammogram).  If you have a family history of breast cancer, talk with your health care provider about genetic screening.  If you are at high risk for breast cancer, talk with your health care provider about having an MRI and a mammogram every year.  Breast cancer (BRCA) gene test is recommended for women who have family members with BRCA-related cancers. Results of the assessment will determine the need for genetic counseling and BRCA1 and for BRCA2 testing. BRCA-related cancers include these types:  Breast. This occurs in males or females.  Ovarian.  Tubal. This may also be called fallopian tube cancer.  Cancer of the abdominal or pelvic lining (peritoneal cancer).  Prostate.  Pancreatic. Cervical, Uterine, and Ovarian Cancer Your health care provider may recommend that you be screened regularly for cancer of the pelvic organs. These include your ovaries, uterus, and vagina. This screening involves a pelvic exam, which includes checking for microscopic changes to the surface of your cervix (Pap test).  For women ages 21-65, health care providers may recommend a pelvic exam and a Pap test every three years. For women ages 41-65, they may recommend the Pap test and pelvic exam, combined with testing for human papilloma virus (HPV), every five years. Some types of HPV increase your risk of cervical cancer. Testing for HPV may also be done on women of any age who have unclear Pap test results.  Other health care providers may not recommend any screening for  nonpregnant women who are considered low risk for pelvic cancer and have no symptoms. Ask your health care provider if a screening pelvic exam is right for you.  If you have had past treatment for cervical cancer or a condition that could lead to cancer, you need Pap tests and screening for cancer for at least 20 years after your treatment. If Pap tests have been discontinued for you, your risk factors (such as having a new sexual partner) need to be reassessed to determine if you should start having screenings again. Some women have medical problems that increase the chance of getting cervical cancer. In these cases, your health care provider may recommend that you have screening and Pap tests more often.  If you have a family history of uterine cancer or ovarian cancer, talk with your health care provider about genetic screening.  If you have vaginal bleeding after reaching menopause, tell your health care provider.  There are currently no reliable tests available to screen for ovarian cancer. Lung Cancer Lung cancer screening is recommended for adults 50-60 years old who are at high risk for lung cancer because of a history of smoking. A yearly low-dose CT scan of the lungs is recommended if you:  Currently smoke.  Have a history of at least 30 pack-years of smoking and  you currently smoke or have quit within the past 15 years. A pack-year is smoking an average of one pack of cigarettes per day for one year. Yearly screening should:  Continue until it has been 15 years since you quit.  Stop if you develop a health problem that would prevent you from having lung cancer treatment. Colorectal Cancer  This type of cancer can be detected and can often be prevented.  Routine colorectal cancer screening usually begins at age 79 and continues through age 14.  If you have risk factors for colon cancer, your health care provider may recommend that you be screened at an earlier age.  If you have  a family history of colorectal cancer, talk with your health care provider about genetic screening.  Your health care provider may also recommend using home test kits to check for hidden blood in your stool.  A small camera at the end of a tube can be used to examine your colon directly (sigmoidoscopy or colonoscopy). This is done to check for the earliest forms of colorectal cancer.  Direct examination of the colon should be repeated every 5-10 years until age 94. However, if early forms of precancerous polyps or small growths are found or if you have a family history or genetic risk for colorectal cancer, you may need to be screened more often. Skin Cancer  Check your skin from head to toe regularly.  Monitor any moles. Be sure to tell your health care provider:  About any new moles or changes in moles, especially if there is a change in a mole's shape or color.  If you have a mole that is larger than the size of a pencil eraser.  If any of your family members has a history of skin cancer, especially at a young age, talk with your health care provider about genetic screening.  Always use sunscreen. Apply sunscreen liberally and repeatedly throughout the day.  Whenever you are outside, protect yourself by wearing long sleeves, pants, a wide-brimmed hat, and sunglasses. WHAT SHOULD I KNOW ABOUT OSTEOPOROSIS? Osteoporosis is a condition in which bone destruction happens more quickly than new bone creation. After menopause, you may be at an increased risk for osteoporosis. To help prevent osteoporosis or the bone fractures that can happen because of osteoporosis, the following is recommended:  If you are 40-49 years old, get at least 1,000 mg of calcium and at least 600 mg of vitamin D per day.  If you are older than age 55 but younger than age 64, get at least 1,200 mg of calcium and at least 600 mg of vitamin D per day.  If you are older than age 19, get at least 1,200 mg of calcium and  at least 800 mg of vitamin D per day. Smoking and excessive alcohol intake increase the risk of osteoporosis. Eat foods that are rich in calcium and vitamin D, and do weight-bearing exercises several times each week as directed by your health care provider. WHAT SHOULD I KNOW ABOUT HOW MENOPAUSE AFFECTS [place]? Depression may occur at any age, but it is more common as you become older. Common symptoms of depression include:  Low or sad mood.  Changes in sleep patterns.  Changes in appetite or eating patterns.  Feeling an overall lack of motivation or enjoyment of activities that you previously enjoyed.  Frequent crying spells. Talk with your health care provider if you think that you are experiencing depression. WHAT SHOULD I KNOW ABOUT IMMUNIZATIONS?  It is important that you get and maintain your immunizations. These include:  Tetanus, diphtheria, and pertussis (Tdap) booster vaccine.  Influenza every year before the flu season begins.  Pneumonia vaccine.  Shingles vaccine. Your health care provider may also recommend other immunizations.   This information is not intended to replace advice given to you by your health care provider. Make sure you discuss any questions you have with your health care provider.   Document Released: 01/19/2006 Document Revised: 12/18/2014 Document Reviewed: 07/30/2014 Elsevier Interactive Patient Education [employer].

## 2016-10-16 MED ORDER — GENTAMICIN SULFATE 40 MG/ML IJ SOLN
INTRAMUSCULAR | Status: AC
  Administered 2016-10-17: 114.5 mL via INTRAVENOUS
  Filled 2016-10-16: qty 8.75

## 2016-10-17 ENCOUNTER — Encounter (HOSPITAL_COMMUNITY)
Admission: RE | Disposition: A | Discharge status: Home / Self Care | Payer: Self-pay | Source: Ambulatory Visit | Attending: Obstetrics & Gynecology

## 2016-10-17 ENCOUNTER — Inpatient Hospital Stay (HOSPITAL_COMMUNITY): Payer: 59 | Admitting: Anesthesiology

## 2016-10-17 ENCOUNTER — Encounter (HOSPITAL_COMMUNITY): Payer: Self-pay

## 2016-10-17 ENCOUNTER — Inpatient Hospital Stay (HOSPITAL_COMMUNITY)
Admission: RE | Admit: 2016-10-17 | Discharge: 2016-10-19 | DRG: 743 | Disposition: A | Discharge status: Home / Self Care | Payer: 59 | Source: Ambulatory Visit | Attending: Obstetrics & Gynecology | Admitting: Obstetrics & Gynecology

## 2016-10-17 DIAGNOSIS — Z9079 Acquired absence of other genital organ(s): Secondary | ICD-10-CM

## 2016-10-17 DIAGNOSIS — F17210 Nicotine dependence, cigarettes, uncomplicated: Secondary | ICD-10-CM | POA: Diagnosis present

## 2016-10-17 DIAGNOSIS — K219 Gastro-esophageal reflux disease without esophagitis: Secondary | ICD-10-CM | POA: Diagnosis present

## 2016-10-17 DIAGNOSIS — N83202 Unspecified ovarian cyst, left side: Secondary | ICD-10-CM | POA: Diagnosis present

## 2016-10-17 DIAGNOSIS — D259 Leiomyoma of uterus, unspecified: Secondary | ICD-10-CM | POA: Diagnosis present

## 2016-10-17 DIAGNOSIS — Z90722 Acquired absence of ovaries, bilateral: Secondary | ICD-10-CM

## 2016-10-17 DIAGNOSIS — N939 Abnormal uterine and vaginal bleeding, unspecified: Secondary | ICD-10-CM | POA: Diagnosis present

## 2016-10-17 DIAGNOSIS — Z90710 Acquired absence of both cervix and uterus: Secondary | ICD-10-CM

## 2016-10-17 HISTORY — PX: SALPINGOOPHORECTOMY: SHX82

## 2016-10-17 HISTORY — PX: CYSTOSCOPY: SHX5120

## 2016-10-17 HISTORY — PX: ABDOMINAL HYSTERECTOMY: SHX81

## 2016-10-17 LAB — CBC
HCT: 41.7 % (ref 36.0–46.0)
Hemoglobin: 14.5 g/dL (ref 12.0–15.0)
MCH: 33.3 pg (ref 26.0–34.0)
MCHC: 34.8 g/dL (ref 30.0–36.0)
MCV: 95.9 fL (ref 78.0–100.0)
Platelets: 224 10*3/uL (ref 150–400)
RBC: 4.35 MIL/uL (ref 3.87–5.11)
RDW: 14.2 % (ref 11.5–15.5)
WBC: 20.2 10*3/uL — ABNORMAL HIGH (ref 4.0–10.5)

## 2016-10-17 LAB — CREATININE, SERUM
Creatinine, Ser: 0.79 mg/dL (ref 0.44–1.00)
GFR calc Af Amer: >60 mL/min (ref >60)
GFR calc non Af Amer: >60 mL/min (ref >60)

## 2016-10-17 LAB — PREGNANCY, URINE: Preg Test, Ur: NEGATIVE

## 2016-10-17 SURGERY — HYSTERECTOMY, ABDOMINAL
Anesthesia: General | Site: Bladder

## 2016-10-17 MED ORDER — FENTANYL CITRATE (PF) 100 MCG/2ML IJ SOLN
INTRAMUSCULAR | Status: AC
  Filled 2016-10-17: qty 2

## 2016-10-17 MED ORDER — HYDROMORPHONE 1 MG/ML IV SOLN
INTRAVENOUS | Status: DC
  Administered 2016-10-17: 16:00:00 via INTRAVENOUS
  Administered 2016-10-17: 2.5 mL via INTRAVENOUS
  Administered 2016-10-17: 7.5 mg via INTRAVENOUS
  Administered 2016-10-18: 3 mg via INTRAVENOUS
  Administered 2016-10-18: 2.7 mg via INTRAVENOUS
  Filled 2016-10-17: qty 25

## 2016-10-17 MED ORDER — BUPIVACAINE LIPOSOME 1.3 % IJ SUSP
20.0000 mL | Freq: Once | INTRAMUSCULAR | Status: AC
  Administered 2016-10-17: 20 mL
  Filled 2016-10-17: qty 20

## 2016-10-17 MED ORDER — FENTANYL CITRATE (PF) 100 MCG/2ML IJ SOLN
INTRAMUSCULAR | Status: DC | PRN
  Administered 2016-10-17: 50 ug via INTRAVENOUS
  Administered 2016-10-17: 100 ug via INTRAVENOUS
  Administered 2016-10-17 (×5): 50 ug via INTRAVENOUS
  Administered 2016-10-17: 100 ug via INTRAVENOUS

## 2016-10-17 MED ORDER — METOCLOPRAMIDE HCL 5 MG/ML IJ SOLN
INTRAMUSCULAR | Status: AC
  Filled 2016-10-17: qty 2

## 2016-10-17 MED ORDER — METOCLOPRAMIDE HCL 5 MG/ML IJ SOLN
INTRAMUSCULAR | Status: DC | PRN
  Administered 2016-10-17: 10 mg via INTRAVENOUS

## 2016-10-17 MED ORDER — LACTATED RINGERS IV SOLN
INTRAVENOUS | Status: DC
  Administered 2016-10-17: 12:00:00 via INTRAVENOUS
  Administered 2016-10-17: 125 mL/h via INTRAVENOUS
  Administered 2016-10-17: 11:00:00 via INTRAVENOUS

## 2016-10-17 MED ORDER — ONDANSETRON HCL 4 MG/2ML IJ SOLN: 4.0000 mg | Freq: Four times a day (QID) | INTRAMUSCULAR | Status: DC | PRN

## 2016-10-17 MED ORDER — SUCCINYLCHOLINE CHLORIDE 20 MG/ML IJ SOLN
INTRAMUSCULAR | Status: DC | PRN
  Administered 2016-10-17: 120 mg via INTRAVENOUS

## 2016-10-17 MED ORDER — VASOPRESSIN 20 UNIT/ML IV SOLN
INTRAVENOUS | Status: AC
Start: 2016-10-17 — End: 2016-10-17
  Filled 2016-10-17: qty 1

## 2016-10-17 MED ORDER — ZOLPIDEM TARTRATE 5 MG PO TABS
5.0000 mg | ORAL_TABLET | Freq: Every evening | ORAL | Status: DC | PRN
  Administered 2016-10-17 – 2016-10-19 (×2): 5 mg via ORAL
  Filled 2016-10-17 (×2): qty 1

## 2016-10-17 MED ORDER — HYDROMORPHONE HCL 1 MG/ML IJ SOLN
INTRAMUSCULAR | Status: DC | PRN
  Administered 2016-10-17 (×3): 1 mg via INTRAVENOUS

## 2016-10-17 MED ORDER — NEOSTIGMINE METHYLSULFATE 10 MG/10ML IV SOLN
INTRAVENOUS | Status: AC
  Filled 2016-10-17: qty 1

## 2016-10-17 MED ORDER — ROCURONIUM BROMIDE 100 MG/10ML IV SOLN
INTRAVENOUS | Status: DC | PRN
  Administered 2016-10-17: 10 mg via INTRAVENOUS
  Administered 2016-10-17: 48 mg via INTRAVENOUS
  Administered 2016-10-17: 2 mg via INTRAVENOUS

## 2016-10-17 MED ORDER — ONDANSETRON HCL 4 MG PO TABS: 4.0000 mg | ORAL_TABLET | Freq: Four times a day (QID) | ORAL | Status: DC | PRN

## 2016-10-17 MED ORDER — SUGAMMADEX SODIUM 500 MG/5ML IV SOLN
INTRAVENOUS | Status: AC
  Filled 2016-10-17: qty 5

## 2016-10-17 MED ORDER — PROPOFOL 10 MG/ML IV BOLUS
INTRAVENOUS | Status: AC
  Filled 2016-10-17: qty 20

## 2016-10-17 MED ORDER — LIDOCAINE HCL (CARDIAC) 20 MG/ML IV SOLN
INTRAVENOUS | Status: DC | PRN
  Administered 2016-10-17: 100 mg via INTRAVENOUS

## 2016-10-17 MED ORDER — BUPIVACAINE HCL 0.25 % IJ SOLN
INTRAMUSCULAR | Status: DC | PRN
  Administered 2016-10-17: 20 mL

## 2016-10-17 MED ORDER — MIDAZOLAM HCL 2 MG/2ML IJ SOLN
INTRAMUSCULAR | Status: DC | PRN
  Administered 2016-10-17: 2 mg via INTRAVENOUS

## 2016-10-17 MED ORDER — CYCLOBENZAPRINE HCL 10 MG PO TABS
10.0000 mg | ORAL_TABLET | Freq: Three times a day (TID) | ORAL | Status: DC | PRN
  Administered 2016-10-17 – 2016-10-19 (×5): 10 mg via ORAL
  Filled 2016-10-17 (×6): qty 1

## 2016-10-17 MED ORDER — BUPIVACAINE HCL (PF) 0.25 % IJ SOLN
INTRAMUSCULAR | Status: AC
  Filled 2016-10-17: qty 30

## 2016-10-17 MED ORDER — SUGAMMADEX SODIUM 200 MG/2ML IV SOLN
INTRAVENOUS | Status: DC | PRN
  Administered 2016-10-17: 180.8 mg via INTRAVENOUS

## 2016-10-17 MED ORDER — FENTANYL CITRATE (PF) 100 MCG/2ML IJ SOLN
100.0000 ug | INTRAMUSCULAR | Status: AC
  Administered 2016-10-17 (×2): 100 ug via INTRAVENOUS

## 2016-10-17 MED ORDER — SUGAMMADEX SODIUM 200 MG/2ML IV SOLN
INTRAVENOUS | Status: AC
  Filled 2016-10-17: qty 2

## 2016-10-17 MED ORDER — DIPHENHYDRAMINE HCL 50 MG/ML IJ SOLN: 12.5000 mg | Freq: Four times a day (QID) | INTRAMUSCULAR | Status: DC | PRN

## 2016-10-17 MED ORDER — KETOROLAC TROMETHAMINE 30 MG/ML IJ SOLN: 30.0000 mg | Freq: Once | INTRAMUSCULAR | Status: DC | PRN

## 2016-10-17 MED ORDER — MENTHOL 3 MG MT LOZG: 1.0000 | LOZENGE | OROMUCOSAL | Status: DC | PRN

## 2016-10-17 MED ORDER — KETOROLAC TROMETHAMINE 30 MG/ML IJ SOLN: 30.0000 mg | Freq: Four times a day (QID) | INTRAMUSCULAR | Status: DC

## 2016-10-17 MED ORDER — HYDROMORPHONE HCL 1 MG/ML IJ SOLN
INTRAMUSCULAR | Status: AC
  Filled 2016-10-17: qty 1

## 2016-10-17 MED ORDER — NALOXONE HCL 0.4 MG/ML IJ SOLN: 0.4000 mg | INTRAMUSCULAR | Status: DC | PRN

## 2016-10-17 MED ORDER — KETOROLAC TROMETHAMINE 30 MG/ML IJ SOLN
30.0000 mg | Freq: Four times a day (QID) | INTRAMUSCULAR | Status: DC
  Administered 2016-10-17 – 2016-10-18 (×3): 30 mg via INTRAVENOUS
  Filled 2016-10-17 (×3): qty 1

## 2016-10-17 MED ORDER — ROCURONIUM BROMIDE 100 MG/10ML IV SOLN
INTRAVENOUS | Status: AC
  Filled 2016-10-17: qty 1

## 2016-10-17 MED ORDER — FENTANYL CITRATE (PF) 100 MCG/2ML IJ SOLN
100.0000 ug | Freq: Once | INTRAMUSCULAR | Status: AC
  Administered 2016-10-17: 100 ug via INTRAVENOUS

## 2016-10-17 MED ORDER — STERILE WATER FOR IRRIGATION IR SOLN
Status: DC | PRN
  Administered 2016-10-17: 1000 mL

## 2016-10-17 MED ORDER — KETOROLAC TROMETHAMINE 30 MG/ML IJ SOLN
INTRAMUSCULAR | Status: DC | PRN
  Administered 2016-10-17: 30 mg via INTRAVENOUS

## 2016-10-17 MED ORDER — PHENYLEPHRINE HCL 10 MG/ML IJ SOLN
INTRAMUSCULAR | Status: DC | PRN
  Administered 2016-10-17: 80 ug via INTRAVENOUS

## 2016-10-17 MED ORDER — ACETAMINOPHEN 325 MG PO TABS: 650.0000 mg | ORAL_TABLET | ORAL | Status: DC | PRN

## 2016-10-17 MED ORDER — ENOXAPARIN SODIUM 40 MG/0.4ML ~~LOC~~ SOLN
40.0000 mg | SUBCUTANEOUS | Status: DC
  Administered 2016-10-18 – 2016-10-19 (×2): 40 mg via SUBCUTANEOUS
  Filled 2016-10-17 (×2): qty 0.4

## 2016-10-17 MED ORDER — PANTOPRAZOLE SODIUM 40 MG PO TBEC
40.0000 mg | DELAYED_RELEASE_TABLET | Freq: Every day | ORAL | Status: DC
  Administered 2016-10-18: 40 mg via ORAL
  Filled 2016-10-17: qty 1

## 2016-10-17 MED ORDER — DEXAMETHASONE SODIUM PHOSPHATE 4 MG/ML IJ SOLN
INTRAMUSCULAR | Status: DC | PRN
  Administered 2016-10-17: 10 mg via INTRAVENOUS

## 2016-10-17 MED ORDER — METHOCARBAMOL 1000 MG/10ML IJ SOLN
1000.0000 mg | Freq: Once | INTRAVENOUS | Status: AC
  Administered 2016-10-17: 1000 mg via INTRAVENOUS
  Filled 2016-10-17: qty 10

## 2016-10-17 MED ORDER — ONDANSETRON HCL 4 MG/2ML IJ SOLN
INTRAMUSCULAR | Status: AC
  Filled 2016-10-17: qty 2

## 2016-10-17 MED ORDER — DEXAMETHASONE SODIUM PHOSPHATE 10 MG/ML IJ SOLN
INTRAMUSCULAR | Status: AC
  Filled 2016-10-17: qty 1

## 2016-10-17 MED ORDER — FENTANYL CITRATE (PF) 250 MCG/5ML IJ SOLN
INTRAMUSCULAR | Status: AC
  Filled 2016-10-17: qty 5

## 2016-10-17 MED ORDER — SIMETHICONE 80 MG PO CHEW
80.0000 mg | CHEWABLE_TABLET | Freq: Four times a day (QID) | ORAL | Status: DC
  Administered 2016-10-17 – 2016-10-18 (×6): 80 mg via ORAL
  Filled 2016-10-17 (×6): qty 1

## 2016-10-17 MED ORDER — SCOPOLAMINE 1 MG/3DAYS TD PT72
MEDICATED_PATCH | TRANSDERMAL | Status: DC
Start: 2016-10-17 — End: 2016-10-19
  Administered 2016-10-17: 1.5 mg via TRANSDERMAL
  Filled 2016-10-17: qty 1

## 2016-10-17 MED ORDER — HYDROMORPHONE HCL 1 MG/ML IJ SOLN
INTRAMUSCULAR | Status: AC
  Filled 2016-10-17: qty 2

## 2016-10-17 MED ORDER — PROPOFOL 10 MG/ML IV BOLUS
INTRAVENOUS | Status: DC | PRN
Start: 2016-10-17 — End: 2016-10-17
  Administered 2016-10-17: 350 mg via INTRAVENOUS

## 2016-10-17 MED ORDER — HYDROMORPHONE HCL 1 MG/ML IJ SOLN
0.2500 mg | INTRAMUSCULAR | Status: DC | PRN
  Administered 2016-10-17 (×4): 0.5 mg via INTRAVENOUS

## 2016-10-17 MED ORDER — LIDOCAINE HCL (CARDIAC) 20 MG/ML IV SOLN
INTRAVENOUS | Status: AC
  Filled 2016-10-17: qty 5

## 2016-10-17 MED ORDER — SCOPOLAMINE 1 MG/3DAYS TD PT72
1.0000 | MEDICATED_PATCH | Freq: Once | TRANSDERMAL | Status: DC
  Administered 2016-10-17: 1.5 mg via TRANSDERMAL

## 2016-10-17 MED ORDER — ONDANSETRON HCL 4 MG/2ML IJ SOLN
INTRAMUSCULAR | Status: DC | PRN
  Administered 2016-10-17: 4 mg via INTRAVENOUS

## 2016-10-17 MED ORDER — SODIUM CHLORIDE 0.9% FLUSH: 9.0000 mL | INTRAVENOUS | Status: DC | PRN

## 2016-10-17 MED ORDER — GLYCOPYRROLATE 0.2 MG/ML IJ SOLN
INTRAMUSCULAR | Status: DC | PRN
  Administered 2016-10-17: 0.1 mg via INTRAVENOUS

## 2016-10-17 MED ORDER — SODIUM CHLORIDE 0.9 % IJ SOLN
INTRAMUSCULAR | Status: AC
Start: 2016-10-17 — End: 2016-10-17
  Filled 2016-10-17: qty 50

## 2016-10-17 MED ORDER — MIDAZOLAM HCL 2 MG/2ML IJ SOLN
INTRAMUSCULAR | Status: AC
  Filled 2016-10-17: qty 2

## 2016-10-17 MED ORDER — DIPHENHYDRAMINE HCL 12.5 MG/5ML PO ELIX: 12.5000 mg | ORAL_SOLUTION | Freq: Four times a day (QID) | ORAL | Status: DC | PRN

## 2016-10-17 MED ORDER — KETOROLAC TROMETHAMINE 30 MG/ML IJ SOLN
INTRAMUSCULAR | Status: AC
  Filled 2016-10-17: qty 1

## 2016-10-17 MED ORDER — LACTATED RINGERS IV SOLN
INTRAVENOUS | Status: DC
  Administered 2016-10-17: 17:00:00 via INTRAVENOUS

## 2016-10-17 SURGICAL SUPPLY — 47 items
APL SKNCLS STERI-STRIP NONHPOA (GAUZE/BANDAGES/DRESSINGS) ×3
BENZOIN TINCTURE PRP APPL 2/3 (GAUZE/BANDAGES/DRESSINGS) ×1 IMPLANT
CANISTER SUCT 3000ML (MISCELLANEOUS) ×4 IMPLANT
CLOTH BEACON ORANGE TIMEOUT ST (SAFETY) ×4 IMPLANT
CONT PATH 16OZ SNAP LID 3702 (MISCELLANEOUS) ×4 IMPLANT
DECANTER SPIKE VIAL GLASS SM (MISCELLANEOUS) ×1 IMPLANT
DRAPE WARM FLUID 44X44 (DRAPE) ×2 IMPLANT
DRSG OPSITE POSTOP 4X10 (GAUZE/BANDAGES/DRESSINGS) ×4 IMPLANT
DURAPREP 26ML APPLICATOR (WOUND CARE) ×4 IMPLANT
ELECT BLADE 6.5 EXT (BLADE) ×1 IMPLANT
GAUZE SPONGE 4X4 16PLY XRAY LF (GAUZE/BANDAGES/DRESSINGS) ×2 IMPLANT
GLOVE BIO SURGEON STRL SZ 6.5 (GLOVE) ×4 IMPLANT
GLOVE BIO SURGEON STRL SZ7 (GLOVE) ×4 IMPLANT
GLOVE BIOGEL PI IND STRL 7.0 (GLOVE) ×9 IMPLANT
GLOVE BIOGEL PI INDICATOR 7.0 (GLOVE) ×3
GOWN STRL REUS W/TWL LRG LVL3 (GOWN DISPOSABLE) ×12 IMPLANT
HEMOSTAT ARISTA ABSORB 3G PWDR (MISCELLANEOUS) ×2 IMPLANT
NEEDLE HYPO 22GX1.5 SAFETY (NEEDLE) ×1 IMPLANT
PACK ABDOMINAL GYN (CUSTOM PROCEDURE TRAY) ×4 IMPLANT
PAD OB MATERNITY 4.3X12.25 (PERSONAL CARE ITEMS) ×4 IMPLANT
PENCIL SMOKE EVAC W/HOLSTER (ELECTROSURGICAL) ×4 IMPLANT
PROTECTOR NERVE ULNAR (MISCELLANEOUS) ×3 IMPLANT
RETRACTOR WND ALEXIS 25 LRG (MISCELLANEOUS) IMPLANT
RTRCTR C-SECT PINK 25CM LRG (MISCELLANEOUS) ×4 IMPLANT
RTRCTR WOUND ALEXIS 25CM LRG (MISCELLANEOUS) ×4
SET CYSTO W/LG BORE CLAMP LF (SET/KITS/TRAYS/PACK) ×4 IMPLANT
SPONGE LAP 18X18 X RAY DECT (DISPOSABLE) ×7 IMPLANT
STRIP CLOSURE SKIN 1/2X4 (GAUZE/BANDAGES/DRESSINGS) ×1 IMPLANT
SUT CHROMIC 2 0 CT 1 (SUTURE) ×1 IMPLANT
SUT CHROMIC 2 0 CT 36 (SUTURE) ×4 IMPLANT
SUT CHROMIC 2 0 SH (SUTURE) ×1 IMPLANT
SUT CHROMIC 2 0 TIES 18 (SUTURE) ×1 IMPLANT
SUT PDS AB 0 CT 36 (SUTURE) ×8 IMPLANT
SUT PLAIN 2 0 (SUTURE)
SUT PLAIN 2 0 XLH (SUTURE) ×1 IMPLANT
SUT PLAIN ABS 2-0 54XMFL TIE (SUTURE) IMPLANT
SUT SILK 2 0 SH (SUTURE) IMPLANT
SUT VIC AB 0 CT1 27 (SUTURE) ×24
SUT VIC AB 0 CT1 27XBRD ANBCTR (SUTURE) ×6 IMPLANT
SUT VIC AB 0 CT1 27XCR 8 STRN (SUTURE) ×9 IMPLANT
SUT VIC AB 2-0 CT1 (SUTURE) ×2 IMPLANT
SUT VIC AB 4-0 KS 27 (SUTURE) ×4 IMPLANT
SUT VICRYL 0 TIES 12 18 (SUTURE) ×4 IMPLANT
SYR CONTROL 10ML LL (SYRINGE) ×1 IMPLANT
TOWEL OR 17X24 6PK STRL BLUE (TOWEL DISPOSABLE) ×8 IMPLANT
TRAY FOLEY CATH SILVER 14FR (SET/KITS/TRAYS/PACK) ×4 IMPLANT
WATER STERILE IRR 1000ML POUR (IV SOLUTION) ×8 IMPLANT

## 2016-10-17 NOTE — Op Note (Signed)
Operative Report  Pre-Operative Diagnosis:Large Fibroid uterus, abnormal bleeding, pain  Postoperative diagnoses: same  Procedure: Total abdominal hysterectomy, bilateral salpingoopherectomy. cystoscopy  Surgeon: Dr. Sanjuana Kava  Asst.: Dr. Bobbye Charleston  Specimen: uterus with cervix, bilateral fallopian tubes and ovaries  EBL: 323mL  IVF: 3000 mL LR  UOP: 300 mL clear urine  Complications: none.   Operative findings: Enlarged fibroid uterus, normal appearing bilateral tubes and ovaries.   Technique:  After adequate general anesthesia was achieved, the patient was prepped and draped in the usual sterile fashion after the foley had been placed.  A Pfannenstiel incision was made with the scalpel and carried down to the fascia.  The fascia was incised with the scalpel in a transverse manner and extended in a transverse curvilinear manner. The fascia was peeled off the underlying rectus muscle superiorly and inferiorly.   The rectus muscles were split in the midline and a bowel free portion of the peritoneum was tented up.  It was entered into with the hemostat and extended in a superior and inferior manner with good visualization of the bowel and bladder. The Alexis retractor was placed in the abdomen and the  bowel was packed away. The uterus was brought out of the pelvis by holding each cornual end with large kelly clamps. The round ligaments were suture ligated with 0-vicryl bilaterally and incised with the bovie.  The bovie was then used to incise a transverse curvilinear incision in the vesico-cervical fascia. The Bladder flap was created and the bladder mobilized downward. The ureters were visualized bilaterally retroperitoneally coursing well below the operative field. The right infundibulopelvic ligament was clamped with a curved heaney clamp and transected.  The pedicle was secured with a free tie then suture ligated with 0- vicryl. This was performed bilaterally.  The bladder  flap was then retracted inferiorly with blunt and cautery dissection below the external cervical os. The  uterine arteries were skeletonized. The uterine arteries were clamped bilaterally with the heaneys and incised with the mayos.  Each pedicle was then secured with a stitch of 0-vicryl.  The uterosacral cardinal ligament complex was sequentially clamped  straight heaney clamps transected with a scalpel and secured with 0-vicryl. This was done bilaterally.     At the level of the reflection of the vagina onto the cervix, two curved heaneys were placed and the cervix removed with the mayo scissory.  The cuff angles were then secured with 0-vicryl and the cuff was closed with interrupted figure of eight stitches of 0-vicryl.  Hemostasis was insured and the ureters were peristalsing well out of the field of dissection.  Irrigation was performed and there was slight oozing from posterior peritoneal edge, so it was sutured with 3-0 chromic.  Arixtra was placed in pelvis as an hemostatic agent.   The instruments and laps were removed from the abdominal cavity and the peritoneum was closed with a running stitch of 1-chromic.  Interrupted suture of 2-0 chromic re-approximated the rectus muscle.The fascia was closed with 0-PDS in a running manner meeting from both ends in the middle. The subcutaneous layer was irrigated and bleeders cauterized. Experil was placed that was diluted in 0.25% Marcaine along the layers of the incision. The subcutaneous tissue was closed with interrupted stitches of 2-0 plain gut. The skin was closed with 3-0 vicryl sub-q with a keith needle. Benzoin, steri strips and a honeycomb bandage was placed.  Sponge, needle and instrument counts were correct x 3. Pt tolerated the procedure well and was  transferred to the recovery room in stable condition.    Lori [name] Lori [name]

## 2016-10-17 NOTE — H&P (Signed)
Lori [name] is an 50 y.o. female P32 perimenopausal female with large fibroid uterus presents for her well woman visit. Patient reports increase in abdominal girth, bloating, and lower pelvic pain. She has also h/o abnormal uterine bleeding. EMBX negative for hyperplasia or malignancy. She has chronic back pain and recently had lumbar steroid injections. Her neurosurgeon wants to perform a lumbar fusion. She denies urinary incontinence or defecatory dysfunction.   Pertinent Gynecological History: Menses: flow is excessive with use of 4-5 pads or tampons on heaviest days Bleeding: dysfunctional uterine bleeding Contraception: IUD DES exposure: denies Blood transfusions: none Sexually transmitted diseases: no past history Previous GYN Procedures: c-section  Last mammogram: BIRAD I Date: 2017 Last pap: normal Date: 2017 OB History: G1, P1   Menstrual History: Menarche age: 52 Patient's last menstrual period was 09/27/2016 (approximate).    Past Medical History:  Diagnosis Date  . ALLERGIC RHINITIS 03/14/2010  . BACK PAIN 01/24/2010  . Buttock pain   . GERD 12/01/2008  . HIATAL HERNIA 05/27/2010  . 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 Numbness and tingling of left upper and lower extremity   . OSTEOARTHRITIS 12/01/2008  . Sleep apnea   . Thigh pain   . 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 TOBACCO USE 12/01/2008  . VITAMIN D DEFICIENCY 05/23/2010    Past Surgical History:  Procedure Laterality Date  . APPENDECTOMY    . BREAST SURGERY     lumpectomy  . CERVICAL FUSION    . CESAREAN SECTION    . COLONOSCOPY    . DILATION AND CURETTAGE OF UTERUS     miscarriage  . ESOPHAGEAL MANOMETRY    . ESOPHAGOGASTRODUODENOSCOPY    . LAPAROSCOPIC NISSEN FUNDOPLICATION    . LUMBAR DISC SURGERY    . LUMBAR LAMINECTOMY    . TONSILLECTOMY AND ADENOIDECTOMY      History reviewed. No pertinent family history.  Social History:  reports that she has been smoking Cigarettes.  She has a 31.00  pack-year smoking history. She has never used smokeless tobacco. She reports that she drinks alcohol. She reports that she does not use drugs.  Allergies:  Allergies  Allergen Reactions  . Amoxicillin Nausea Only    Yeast Infection Has patient had a PCN reaction causing immediate rash, facial/tongue/throat swelling, SOB or lightheadedness with hypotension: no Has patient had a PCN reaction causing severe rash involving mucus membranes or skin necrosis: no Has patient had a PCN reaction that required hospitalization yes Has patient had a PCN reaction occurring within the last 10 years: yes If all of the above answers are "NO", then may proceed with Cephalosporin use.   Marland Kitchen Morphine     REACTION: hyper  . Torecan [Thiethylperazine]     Prescriptions Prior to Admission  Medication Sig Dispense Refill Last Dose  . Biotin w/ Vitamins C & E (HAIR/SKIN/NAILS PO) Take 1 tablet by mouth every morning.   Past Week at Unknown time  . Cholecalciferol (VITAMIN D-3) 1000 UNITS CAPS Take 1 capsule by mouth daily.   Past Week at Unknown time  . cyclobenzaprine (FLEXERIL) 10 MG tablet Take 10 mg by mouth 3 (three) times daily as needed.    10/16/2016 at 2000  . diazepam (VALIUM) 5 MG tablet Take 1 tablet (5 mg total) by mouth at bedtime as needed. (Patient taking differently: Take 5 mg by mouth at bedtime as needed for anxiety. ) 90 tablet 0 10/16/2016 at 1200  . fluticasone (FLONASE) 50 MCG/ACT nasal spray USE 2 SPRAYS  IN NOSE DAILY 16 g 6 10/17/2016 at 0600  . HYDROcodone-acetaminophen (NORCO) 7.5-325 MG tablet TAKE ONE TABLET BY MOUTH EVERY 6 HOURS AS NEEDED FOR PAIN 120 tablet 0 10/17/2016 at 0610  . ibuprofen (ADVIL,MOTRIN) 800 MG tablet Take 800 mg by mouth every 6 (six) hours as needed for moderate pain.   0 Past Week at Unknown time  . Multiple Vitamin (MULTIVITAMIN) tablet Take 1 tablet by mouth daily.   Past Month at Unknown time  . albuterol (PROVENTIL HFA;VENTOLIN HFA) 108 (90 Base) MCG/ACT inhaler  Inhale 2 puffs into the lungs every 6 (six) hours. 1 Inhaler 0 Taking  . furosemide (LASIX) 20 MG tablet TAKE 1 TABLET BY MOUTH DAILY 90 tablet 0 Taking    Review of Systems  Constitutional: Negative for chills and fever.  Eyes: Negative for blurred vision and double vision.  Cardiovascular: Negative for chest pain.  Gastrointestinal: Negative for heartburn and nausea.  Genitourinary: Negative for dysuria.    Blood pressure (!) 126/92, pulse 92, temperature 97.9 F (36.6 C), temperature source Oral, resp. rate 20, last menstrual period 09/27/2016, SpO2 100 %. Physical Exam  Genitourinary:  Genitourinary Comments: Examination: limited by habitus. Vulva: no masses, atrophy, or lesions. Mons: no erythema, excoriation, atrophy, lesions, masses, swelling, tenderness, or vesicles/ ulcers and normal. Labia Majora: no erythema, excoriation, atrophy, discoloration, lesions, masses, swelling, tenderness, or vesicles/ ulcers and normal. Labia Minora: no erythema, excoriation, atrophy, discoloration, lesions, vesicles, masses, swelling, or tenderness and normal. Introitus: normal. Bartholin's Gland: normal. Vagina: no discharge, erythema, atrophy, lesions, ulcers, swelling, masses, tenderness, prolapse, or blood present and normal. Cervix: no lesions, discharge, bleeding, or cervical motion tenderness and grossly normal and sample taken for a Pap smear. Uterus: fixed, fibroids, contour irregular, and enlarged 18 weeks size and midline, non-tender, and no uterine prolapse. Urethral Meatus/ Urethra no discharge, masses, or tenderness and normal meatus and well supported urethra. Bladder: non-distended, non-tender, and no palpable mass. Adnexa/Parametria: no tenderness or mass palpable.     Results for orders placed or performed during the hospital encounter of 10/17/16 (from the past 24 hour(s))  Pregnancy, urine     Status: None   Collection Time: 10/17/16  8:45 AM  Result Value Ref Range   Preg Test, Ur  NEGATIVE NEGATIVE    No results found.  Assessment/Plan: 50 yo with large fibroid uterus. She is symptomatic having abnormal bleeding and bulk symptoms.  EMBX was negative for hyperplasia or malignancy.  She also has a 4.9cm left ovarian cyst. CA 125 was within normal limits.  Pap smear negative  We discussed performing an abdominal hysterectomy. I recommend that both her fallopian tubes and bilateral ovaries are removed at the time of surgery. She is a smoker, so we discussed that I would not be able to put her on HRT while she still smokes. I encouraged her to stop smoking.  The R/B of hysterectomy were discussed w/ the pt. to include, but not limited to bleeding/transfusion, infection, wound breakdown/poor healing, damage to organs in abd/pelvis w/ possible need for further surgical repair, blood clot, PE/MI/stroke, post op vaginal prolapse,diagnosis of cancer and the need for further surgery, pt understands these risks and consents to surgery.   On call to OR for TAH / [place], [place] 10/17/2016, 10:18 AM

## 2016-10-17 NOTE — Transfer of Care (Signed)
Immediate Anesthesia Transfer of Care Note  Patient: Lori [name]  Procedure(s) Performed: Procedure(s): HYSTERECTOMY ABDOMINAL (N/A) SALPINGO OOPHORECTOMY (Bilateral) CYSTOSCOPY (N/A)  Patient Location: PACU  Anesthesia Type:General  Level of Consciousness: awake, alert  and oriented  Airway & Oxygen Therapy: Patient Spontanous Breathing and Patient connected to nasal cannula oxygen  Post-op Assessment: Report given to RN and Post -op Vital signs reviewed and stable  Post vital signs: Reviewed and stable  Last Vitals:  Vitals:   10/17/16 0900  BP: (!) 126/92  Pulse: 92  Resp: 20  Temp: 36.6 C    Last Pain:  Vitals:   10/17/16 0900  TempSrc: Oral  PainSc: 5       Patients Stated Pain Goal: 5 ([id number])  Complications: No apparent anesthesia complications

## 2016-10-17 NOTE — Addendum Note (Signed)
Addendum  created 10/17/16 1639 by Flossie Dibble, CRNA   Sign clinical note

## 2016-10-17 NOTE — Anesthesia Postprocedure Evaluation (Signed)
Anesthesia Post Note  Patient: Lori [name]  Procedure(s) Performed: Procedure(s) (LRB): HYSTERECTOMY ABDOMINAL (N/A) SALPINGO OOPHORECTOMY (Bilateral) CYSTOSCOPY (N/A)  Patient location during evaluation: [facility] Anesthesia Type: General Level of consciousness: awake and alert Pain management: satisfactory to patient Vital Signs Assessment: post-procedure vital signs reviewed and stable Respiratory status: spontaneous breathing and respiratory function stable Cardiovascular status: stable Postop Assessment: adequate PO intake Anesthetic complications: no     Last Vitals:  Vitals:   10/17/16 1515 10/17/16 1538  BP: 119/81 (!) 142/76  Pulse: 61 71  Resp: 16 16  Temp: 36.4 C 36.4 C    Last Pain:  Vitals:   10/17/16 1554  TempSrc:   PainSc: 7    Pain Goal: Patients Stated Pain Goal: 5 (10/17/16 1538)               Brianna Esson

## 2016-10-17 NOTE — Anesthesia Procedure Notes (Signed)
Procedure Name: Intubation Date/Time: 10/17/2016 10:51 AM Performed by: Flossie Dibble Pre-anesthesia Checklist: Timeout performed, Patient being monitored, Suction available, Emergency Drugs available and Patient identified Patient Re-evaluated:Patient Re-evaluated prior to inductionOxygen Delivery Method: Circle system utilized Preoxygenation: Pre-oxygenation with 100% oxygen Intubation Type: IV induction Ventilation: Mask ventilation without difficulty Laryngoscope Size: Glidescope and 3 (LoPro #3) Grade View: Grade I Tube type: Oral Tube size: 7.0 mm Number of attempts: 1 Airway Equipment and Method: Patient positioned with wedge pillow and Stylet Placement Confirmation: ETT inserted through vocal cords under direct vision,  positive ETCO2 and breath sounds checked- equal and bilateral Secured at: 23 cm Tube secured with: Tape Dental Injury: Teeth and Oropharynx as per pre-operative assessment  Difficulty Due To: Difficulty was anticipated, Difficult Airway- due to anterior larynx and Difficult Airway- due to reduced neck mobility

## 2016-10-17 NOTE — Anesthesia Preprocedure Evaluation (Addendum)
Anesthesia Evaluation  Patient identified by MRN, date of birth, ID band Patient awake    Reviewed: Allergy & Precautions, NPO status , Patient's Chart, lab work & pertinent test results  Airway Mallampati: III  TM Distance: >3 FB Neck ROM: Full    Dental  (+) Dental Advisory Given   Pulmonary sleep apnea , Current Smoker,    breath sounds clear to auscultation       Cardiovascular negative cardio ROS   Rhythm:Regular Rate:Normal     Neuro/Psych negative neurological ROS     GI/Hepatic Neg liver ROS, GERD  ,  Endo/Other  negative endocrine ROS  Renal/GU negative Renal ROS     Musculoskeletal  (+) Arthritis ,   Abdominal   Peds  Hematology negative hematology ROS (+)   Anesthesia Other Findings   Reproductive/Obstetrics                            Lab Results  Component Value Date   WBC 13.1 (H) 10/12/2016   HGB 15.5 (H) 10/12/2016   HCT 44.6 10/12/2016   MCV 95.9 10/12/2016   PLT 236 10/12/2016   Lab Results  Component Value Date   CREATININE 0.82 10/06/2016   BUN 14 10/06/2016   NA 140 10/06/2016   K 4.0 10/06/2016   CL 105 10/06/2016   CO2 26 10/06/2016    Anesthesia Physical Anesthesia Plan  ASA: II  Anesthesia Plan: General   Post-op Pain Management:    Induction: Intravenous  Airway Management Planned: Oral ETT  Additional Equipment:   Intra-op Plan:   Post-operative Plan: Extubation in OR  Informed Consent: I have reviewed the patients History and Physical, chart, labs and discussed the procedure including the risks, benefits and alternatives for the proposed anesthesia with the patient or authorized representative who has indicated his/her understanding and acceptance.   Dental advisory given  Plan Discussed with: CRNA  Anesthesia Plan Comments:         Anesthesia Quick Evaluation

## 2016-10-17 NOTE — Anesthesia Postprocedure Evaluation (Signed)
Anesthesia Post Note  Patient: Lori [name]  Procedure(s) Performed: Procedure(s) (LRB): HYSTERECTOMY ABDOMINAL (N/A) SALPINGO OOPHORECTOMY (Bilateral) CYSTOSCOPY (N/A)  Patient location during evaluation: PACU Anesthesia Type: General Level of consciousness: awake and sedated Pain management: pain level controlled Vital Signs Assessment: post-procedure vital signs reviewed and stable Respiratory status: spontaneous breathing Cardiovascular status: stable Postop Assessment: no signs of nausea or vomiting Anesthetic complications: no     Last Vitals:  Vitals:   10/17/16 1345 10/17/16 1415  BP: 131/84 (!) 144/84  Pulse: 89 73  Resp: 19 (!) 32  Temp:      Last Pain:  Vitals:   10/17/16 1415  TempSrc:   PainSc: 6    Pain Goal: Patients Stated Pain Goal: 5 (10/17/16 1415)               [place]

## 2016-10-18 ENCOUNTER — Encounter (HOSPITAL_COMMUNITY): Payer: Self-pay | Admitting: Obstetrics & Gynecology

## 2016-10-18 LAB — CBC
HCT: 37.6 % (ref 36.0–46.0)
Hemoglobin: 13.1 g/dL (ref 12.0–15.0)
MCH: 33.2 pg (ref 26.0–34.0)
MCHC: 34.8 g/dL (ref 30.0–36.0)
MCV: 95.2 fL (ref 78.0–100.0)
Platelets: 223 10*3/uL (ref 150–400)
RBC: 3.95 MIL/uL (ref 3.87–5.11)
RDW: 14.3 % (ref 11.5–15.5)
WBC: 13.3 10*3/uL — ABNORMAL HIGH (ref 4.0–10.5)

## 2016-10-18 MED ORDER — IBUPROFEN 800 MG PO TABS
800.0000 mg | ORAL_TABLET | Freq: Four times a day (QID) | ORAL | Status: DC
  Administered 2016-10-18 – 2016-10-19 (×4): 800 mg via ORAL
  Filled 2016-10-18 (×4): qty 1

## 2016-10-18 MED ORDER — OXYCODONE HCL 5 MG PO TABS
10.0000 mg | ORAL_TABLET | ORAL | Status: DC | PRN
  Administered 2016-10-18 – 2016-10-19 (×9): 10 mg via ORAL
  Filled 2016-10-18 (×9): qty 2

## 2016-10-18 NOTE — Progress Notes (Signed)
PCA discontinued. IV converted to saline lock. Toya Smothers, RN

## 2016-10-18 NOTE — Progress Notes (Signed)
1 Day Post-Op Procedure(s) (LRB): HYSTERECTOMY ABDOMINAL (N/A) SALPINGO OOPHORECTOMY (Bilateral) CYSTOSCOPY (N/A)  Subjective: Patient reports lower back pain, however her abdominal incision doesn't hurt. She denies chest pain, no shortness of breath, no nausea or vomiting.  She has been out of bed, voiding w/o difficulty.     Objective: I have reviewed patient's vital signs, intake and output, medications and labs.  General: alert, cooperative and no distress GI: soft, non-tender; bowel sounds normal; no masses,  no organomegaly, normal findings: bowel sounds normal and incision: clean, dry and intact Extremities: extremities normal, atraumatic, no cyanosis or edema, Homans sign is negative, no sign of DVT and no edema, redness or tenderness in the calves or thighs Vaginal Bleeding: none  Assessment: s/p Procedure(s): HYSTERECTOMY ABDOMINAL (N/A) SALPINGO OOPHORECTOMY (Bilateral) CYSTOSCOPY (N/A): progressing well  Plan: Advance diet Encourage ambulation Advance to PO medication Discontinue IV fluids  LOS: 1 day    Lori [name], [place] 10/18/2016, 8:13 AM

## 2016-10-19 MED ORDER — IBUPROFEN 800 MG PO TABS: 800.0000 mg | ORAL_TABLET | Freq: Three times a day (TID) | ORAL | 0 refills | Status: DC | PRN

## 2016-10-19 MED ORDER — OXYCODONE HCL 10 MG PO TABS: 10.0000 mg | ORAL_TABLET | ORAL | 0 refills | Status: DC | PRN

## 2016-10-19 NOTE — Progress Notes (Signed)
Patient is eating, ambulating, and voiding.  Pain control is good.  BP (!) 153/88 (BP Location: Right Arm) Comment: nurse notified  Mary  Pulse 97   Temp 98.6 F (37 C) (Oral)   Resp 18   Ht 5\' 4"  (1.626 m)   Wt 90.3 kg (199 lb)   LMP 09/27/2016 (Approximate)   SpO2 97%   BMI 34.16 kg/m   lungs:   clear to auscultation cor:    RRR Abdomen:  soft, appropriate tenderness, incisions intact and without erythema or exudate. ex:    no cords   Lab Results  Component Value Date   WBC 13.3 (H) 10/18/2016   HGB 13.1 10/18/2016   HCT 37.6 10/18/2016   MCV 95.2 10/18/2016   PLT 223 10/18/2016    A/P  Routine care.  Expect d/c per plan.

## 2016-10-19 NOTE — Discharge Summary (Signed)
Physician Discharge Summary  Patient ID: Lori [name] MRN: [id number] DOB/AGE: Apr 29, 1966 50 y.o.  Admit date: 10/17/2016 Discharge date: 10/19/2016  Admission Diagnoses: Uterine fibroids, abnormal bleeding, pain  Discharge Diagnoses:  Active Problems:   S/P TAH-BSO (total abdominal hysterectomy and bilateral salpingo-oophorectomy)   Discharged Condition: good  Hospital Course: Patient taken to OR where below procedures were performed. Patient had no intraoperative complications.  Post operatively her pain from bulging discs in her back caused severe pain, and muscle relaxer meds were added to her regimen.  Pt progress well post op and pain improved. She was discharged home on POD 2  Consults: None  Significant Diagnostic Studies: labs: post op Hb 11  Treatments: IV hydration, antibiotics: gentamycin and clindamycin pre op, analgesia: Dilaudid, anticoagulation: LMW heparin and surgery: TAH/ BSO/ Cysto  Discharge Exam: Blood pressure (!) 153/88, pulse 97, temperature 98.6 F (37 C), temperature source Oral, resp. rate 18, height 5\' 4"  (1.626 m), weight 90.3 kg (199 lb), last menstrual period 09/27/2016, SpO2 97 %. General appearance: alert, cooperative and appears stated age GI: soft, non-tender; bowel sounds normal; no masses,  no organomegaly Extremities: extremities normal, atraumatic, no cyanosis or edema, Homans sign is negative, no sign of DVT and no edema, redness or tenderness in the calves or thighs Incision/Wound: clean dry intact  Disposition:    HOME   Signed: Caffie Damme 10/19/2016, 7:32 AM

## 2016-10-24 ENCOUNTER — Telehealth: Payer: Self-pay | Admitting: Internal Medicine

## 2016-10-24 MED ORDER — HYDROCODONE-ACETAMINOPHEN 7.5-325 MG PO TABS: ORAL_TABLET | ORAL | 0 refills | Status: DC

## 2016-10-24 NOTE — Telephone Encounter (Signed)
Pt request refill  HYDROcodone-acetaminophen (NORCO) 7.5-325 MG tablet  Would like to pick up tomorrow because she will be in town then.

## 2016-10-25 NOTE — Telephone Encounter (Signed)
Pt notified Rx ready for pickup. Rx printed and signed.  

## 2016-10-31 DIAGNOSIS — M5136 Other intervertebral disc degeneration, lumbar region: Secondary | ICD-10-CM

## 2016-11-08 ENCOUNTER — Telehealth: Payer: Self-pay | Admitting: Pulmonary Disease

## 2016-11-08 NOTE — Telephone Encounter (Signed)
lmtcb X1 for pt  

## 2016-11-10 NOTE — Telephone Encounter (Signed)
Called the [facility] and they stated that the order they received was signed on 8/3 and that they had denied the order for some reason.  She stated that this was very light in color when it was faxed in and that may have been the reason. Will forward to [place] to follow up on this.  They are faxing the order that they have to the fax up front.  thanks

## 2016-11-10 NOTE — Telephone Encounter (Signed)
I called and spoke with Lanny Hurst with [facility] in [place], [place] and he spoke with Nira Conn there in his office. They seem to think that it was a poor quality copy of the CMN that was faxed to them. I explained to him that since they changed to this new system we have been getting much lighter copies when they are being faxed to [name]. They were going to send another copy for Dr. Murlean Iba to sign and refax back to them. Since Dr. Murlean Iba is not back in the office until next week I have stamped the CMN and refaxed until he returns to office to sign the original. I have called and spoken with Mrs. Alinda Money and explained this to her in hopes that they will refile everything with her insurance for them to make an adjustment in the bill she received. She is aware that she has to pay her 20% anyway

## 2016-11-27 ENCOUNTER — Other Ambulatory Visit: Payer: Self-pay | Admitting: Family Medicine

## 2016-11-29 ENCOUNTER — Telehealth: Payer: Self-pay | Admitting: Internal Medicine

## 2016-11-29 NOTE — Telephone Encounter (Signed)
Pt was calling b/c someone had called and did not leave a msg she think it was concerning her pharmacy that she uses.  Pt state that [facility] [address] and she would like for all other pharmacies removed off of her profile.

## 2016-11-29 NOTE — Telephone Encounter (Signed)
I called pt earlier and left a voice mail to clarify which pharmacy to use. Writer called pt to inform that Rx has been refilled and [facility] to [facility] in [place],  pt expressed understanding.

## 2016-12-29 ENCOUNTER — Other Ambulatory Visit: Payer: Self-pay | Admitting: Internal Medicine

## 2016-12-29 ENCOUNTER — Telehealth: Payer: Self-pay | Admitting: Internal Medicine

## 2016-12-29 MED ORDER — HYDROCODONE-ACETAMINOPHEN 7.5-325 MG PO TABS: ORAL_TABLET | ORAL | 0 refills | Status: DC

## 2016-12-29 NOTE — Telephone Encounter (Signed)
Pt request refill  °HYDROcodone-acetaminophen (NORCO) 7.5-325 MG tablet °

## 2016-12-29 NOTE — Telephone Encounter (Signed)
Pt notified Rx ready for pickup. Rx printed and signed.  

## 2017-02-05 ENCOUNTER — Encounter: Payer: Self-pay | Admitting: Pulmonary Disease

## 2017-02-06 ENCOUNTER — Encounter: Payer: Self-pay | Admitting: Pulmonary Disease

## 2017-02-06 ENCOUNTER — Ambulatory Visit (INDEPENDENT_AMBULATORY_CARE_PROVIDER_SITE_OTHER): Payer: 59 | Admitting: Pulmonary Disease

## 2017-02-06 DIAGNOSIS — F17200 Nicotine dependence, unspecified, uncomplicated: Secondary | ICD-10-CM | POA: Diagnosis not present

## 2017-02-06 DIAGNOSIS — G4733 Obstructive sleep apnea (adult) (pediatric): Secondary | ICD-10-CM | POA: Diagnosis not present

## 2017-02-06 NOTE — Assessment & Plan Note (Signed)
Smoking cessation done.  

## 2017-02-06 NOTE — Assessment & Plan Note (Addendum)
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 She works at [employer].  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  Lives in [place] and drives 1 hr 1 [address].  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 Has snoring, gasping, witnessed apneas,choking.  Gets sleepy driving.  Hypersomnia affects fxnality.  Gained 20 lbs over 6 mos.  ESS 15  Takes Hydrocodone 3-4x/day x 3 yrs. Takes valium every 3-4 weeks with 2nd shift.   HST (June 2017) AHI 5.  She started CPAP mid August. Feels better using it. More energy, less sleepiness. Unfortunately, she feels pressures too much. Recent sinus congestion and postnasal drip. Her cpap settings were changed to 5-15 cm water to 5-10 cm water.  Feels better with lower cpap pressure.  Tolerating cpap. Feels benefit of cpap.  DL the last month : 87%,  AHI 0.8.   Plan : We extensively discussed the importance of treating OSA and the need to use PAP therapy.   Continue with autocpap 5-10 cm water.    Patient was instructed to have mask, tubings, filter, reservoir cleaned at least once a week with soapy water.  Patient was instructed to call the office if he/she is having issues with the PAP device.    I advised patient to obtain sufficient amount of sleep --  7 to 8 hours at least in a 24 hr period.  Patient was advised to follow good sleep hygiene.  Patient was advised NOT to engage in activities requiring concentration and/or vigilance if he/she is and  sleepy.  Patient is NOT to drive if he/she is sleepy.

## 2017-02-06 NOTE — Progress Notes (Signed)
Subjective:    Patient ID: Lori [name], female    DOB: [birth year], 51 y.o.   MRN: [id number]  HPI   This is the case of Lori [name], 51 y.o. Female, who was referred by Dr. Bluford Kaufmann in consultation regarding possible OSA.   As you very well know, patient has a 30PY smoking history, smokes 1PPD.  Not known to have asthma or copd.  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 She works at [employer].  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  Lives in [place] and drives 1 hr 1 [address].  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 Has snoring, gasping, witnessed apneas,choking.  Gets sleepy driving.  Hypersomnia affects fxnality.  Gained 20 lbs over 6 mos.  Takes Hydrocodone 3-4x/day x 3 yrs. Takes valium every 3-4 weeks with 2nd shift.   ROV 08/10/16 Patient is here for follow-up on her sleep apnea. Since last seen, she had a home sleep test which showed an AHI of 5. (June 2017). She was started on auto CPAP August 12. Since that time, she has nasal congestion and postnasal drip, cough. Download shows her AHI was 2. Pressures too much. Also with recent nasal congestion and bronchitis. Not sure if it's related to CPAP or recent sick exposure. Better with prednisone. Continues to smoke a pack a day.  ROV 02/06/17 Patient returns to the office as follow-up on her sleep apnea. Since last seen, she uses her CPAP machine. Feels better using it. More energy. DL the last month: 87%, AHI 0.8.No issues with cpap.   Review of Systems  Constitutional: Negative.  Negative for fever and unexpected weight change.  HENT: Positive for congestion and rhinorrhea. Negative for dental problem, ear pain, nosebleeds, postnasal drip, sinus pressure, sneezing, sore throat and trouble swallowing.   Eyes: Negative.  Negative for redness and itching.  Respiratory: Negative.  Negative for cough, chest tightness, shortness of breath and wheezing.     Cardiovascular: Positive for leg swelling. Negative for palpitations.  Gastrointestinal: Negative.  Negative for nausea and vomiting.  Endocrine: Negative.   Genitourinary: Negative.  Negative for dysuria.  Musculoskeletal: Positive for arthralgias, back pain and joint swelling.  Skin: Negative.  Negative for rash.  Allergic/Immunologic: Positive for environmental allergies.  Neurological: Positive for headaches.  Hematological: Negative.  Does not bruise/bleed easily.  Psychiatric/Behavioral: Negative.  Negative for dysphoric mood. The patient is not nervous/anxious.       Objective:   Physical Exam  Vitals:  Vitals:   02/06/17 1610  BP: 136/76  Pulse: 92  SpO2: 98%  Weight: 203 lb 12.8 oz (92.4 kg)  Height: 5\' 5"  (1.651 m)    Constitutional/General:  Pleasant, well-nourished, well-developed, not in any distress,  Comfortably seating.  Well kempt  Body mass index is 33.91 kg/m. Wt Readings from Last 3 Encounters:  02/06/17 203 lb 12.8 oz (92.4 kg)  10/17/16 199 lb (90.3 kg)  10/13/16 199 lb 4 oz (90.4 kg)      HEENT: Pupils equal and reactive to light and accommodation. Anicteric sclerae. Normal nasal mucosa.   No oral  lesions,  mouth clear,  oropharynx clear, no postnasal drip. (-) Oral thrush. No dental caries.  Airway - Mallampati class III-IV  Neck: No masses. Midline trachea. No JVD, (-) LAD. (-) bruits appreciated.  Respiratory/Chest: Grossly normal chest. (-) deformity. (-) Accessory muscle use.  Symmetric expansion. (-) Tenderness on palpation.  Resonant  on percussion.  Diminished BS on both lower lung zones. (-) wheezing, crackles, rhonchi (-) egophony  Cardiovascular: Regular rate and  rhythm, heart sounds normal, no murmur or gallops, no peripheral edema  Gastrointestinal:  Normal bowel sounds. Soft, non-tender. No hepatosplenomegaly.  (-) masses.   Musculoskeletal:  Normal muscle tone. Normal gait.   Extremities: Grossly normal. (-) clubbing,  cyanosis.  (-) edema  Skin: (-) rash,lesions seen.   Neurological/Psychiatric : alert, oriented to time, place, person. Normal mood and affect            Assessment & Plan:  OSA (obstructive sleep apnea) Pt does shift work -- 2 weeks 3rd, 2 weeks 2nd, then 2 weeks 1st. She works at [employer].  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  Lives in [place] and drives 1 hr 1 way.  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 Has snoring, gasping, witnessed apneas,choking.  Gets sleepy driving.  Hypersomnia affects fxnality.  Gained 20 lbs over 6 mos.  ESS 15  Takes Hydrocodone 3-4x/day x 3 yrs. Takes valium every 3-4 weeks with 2nd shift.   HST (June 2017) AHI 5.  She started CPAP mid August. Feels better using it. More energy, less sleepiness. Unfortunately, she feels pressures too much. Recent sinus congestion and postnasal drip. Her cpap settings were changed to 5-15 cm water to 5-10 cm water.  Feels better with lower cpap pressure.  Tolerating cpap. Feels benefit of cpap.  DL the last month : 87%,  AHI 0.8.   Plan : We extensively discussed the importance of treating OSA and the need to use PAP therapy.   Continue with autocpap 5-10 cm water.    Patient was instructed to have mask, tubings, filter, reservoir cleaned at least once a week with soapy water.  Patient was instructed to call the office if he/she is having issues with the PAP device.    I advised patient to obtain sufficient amount of sleep --  7 to 8 hours at least in a 24 hr period.  Patient was advised to follow good sleep hygiene.  Patient was advised NOT to engage in activities requiring concentration and/or vigilance if he/she is and  sleepy.  Patient is NOT to drive if he/she is sleepy.      TOBACCO USE Smoking cessation done    Patient will follow up in 46yr, sooner if with issues.     Monica Becton, MD 02/06/2017   4:43 PM [facility] [place] Pager: [phone] Office: [phone], Fax: [phone]

## 2017-02-06 NOTE — Patient Instructions (Signed)
  It was a pleasure taking care of you today!  Continue using your CPAP machine.   Please make sure you use your CPAP device everytime you sleep.  We will monitor the usage of your machine per your insurance requirement.  Your insurance company may take the machine from you if you are not using it regularly.   Please clean the mask, tubings, filter, water reservoir with soapy water every week.  Please use distilled water for the water reservoir.   Please call the office or your machine provider (DME company) if you are having issues with the device.   Return to clinic in 1 year   with  NP/APP   

## 2017-02-09 ENCOUNTER — Ambulatory Visit (INDEPENDENT_AMBULATORY_CARE_PROVIDER_SITE_OTHER): Payer: 59 | Admitting: Internal Medicine

## 2017-02-09 ENCOUNTER — Encounter: Payer: Self-pay | Admitting: Internal Medicine

## 2017-02-09 VITALS — BP 132/74 | HR 84 | Temp 98.0°F | Ht 65.0 in | Wt 205.0 lb

## 2017-02-09 DIAGNOSIS — Z90722 Acquired absence of ovaries, bilateral: Secondary | ICD-10-CM | POA: Diagnosis not present

## 2017-02-09 DIAGNOSIS — E559 Vitamin D deficiency, unspecified: Secondary | ICD-10-CM | POA: Diagnosis not present

## 2017-02-09 DIAGNOSIS — M549 Dorsalgia, unspecified: Secondary | ICD-10-CM | POA: Diagnosis not present

## 2017-02-09 DIAGNOSIS — Z90710 Acquired absence of both cervix and uterus: Secondary | ICD-10-CM

## 2017-02-09 DIAGNOSIS — R233 Spontaneous ecchymoses: Secondary | ICD-10-CM | POA: Diagnosis not present

## 2017-02-09 DIAGNOSIS — M159 Polyosteoarthritis, unspecified: Secondary | ICD-10-CM

## 2017-02-09 DIAGNOSIS — G8929 Other chronic pain: Secondary | ICD-10-CM

## 2017-02-09 DIAGNOSIS — M150 Primary generalized (osteo)arthritis: Secondary | ICD-10-CM

## 2017-02-09 DIAGNOSIS — Z9079 Acquired absence of other genital organ(s): Secondary | ICD-10-CM | POA: Diagnosis not present

## 2017-02-09 DIAGNOSIS — M8949 Other hypertrophic osteoarthropathy, multiple sites: Secondary | ICD-10-CM

## 2017-02-09 DIAGNOSIS — R3121 Asymptomatic microscopic hematuria: Secondary | ICD-10-CM | POA: Diagnosis not present

## 2017-02-09 LAB — CBC WITH DIFFERENTIAL/PLATELET
Basophils Absolute: 0.1 10*3/uL (ref 0.0–0.1)
Basophils Relative: 0.8 % (ref 0.0–3.0)
Eosinophils Absolute: 0.2 10*3/uL (ref 0.0–0.7)
Eosinophils Relative: 1.4 % (ref 0.0–5.0)
HCT: 40.7 % (ref 36.0–46.0)
Hemoglobin: 13.7 g/dL (ref 12.0–15.0)
Lymphocytes Relative: 35.2 % (ref 12.0–46.0)
Lymphs Abs: 4 10*3/uL (ref 0.7–4.0)
MCHC: 33.6 g/dL (ref 30.0–36.0)
MCV: 94.5 fl (ref 78.0–100.0)
Monocytes Absolute: 0.6 10*3/uL (ref 0.1–1.0)
Monocytes Relative: 5.2 % (ref 3.0–12.0)
Neutro Abs: 6.5 10*3/uL (ref 1.4–7.7)
Neutrophils Relative %: 57.4 % (ref 43.0–77.0)
Platelets: 230 10*3/uL (ref 150.0–400.0)
RBC: 4.31 Mil/uL (ref 3.87–5.11)
RDW: 14.9 % (ref 11.5–15.5)
WBC: 11.3 10*3/uL — ABNORMAL HIGH (ref 4.0–10.5)

## 2017-02-09 LAB — POCT URINALYSIS DIPSTICK
Bilirubin, UA: NEGATIVE
Glucose, UA: NEGATIVE
Ketones, UA: NEGATIVE
Leukocytes, UA: NEGATIVE
Nitrite, UA: NEGATIVE
Protein, UA: NEGATIVE
Spec Grav, UA: 1.015
Urobilinogen, UA: 0.2
pH, UA: 7

## 2017-02-09 LAB — VITAMIN D 25 HYDROXY (VIT D DEFICIENCY, FRACTURES): VITD: 23.29 ng/mL — ABNORMAL LOW (ref 30.00–100.00)

## 2017-02-09 NOTE — Patient Instructions (Signed)
Drink as much fluid as you  can tolerate over the next few days  Call or return to clinic prn if these symptoms worsen or fail to improve as anticipated.  

## 2017-02-09 NOTE — Progress Notes (Signed)
Subjective:    Patient ID: Lori [name], female    DOB: 09/05/1966, 51 y.o.   MRN: [id number]  HPI  51 year old patient who is status post TAH/BSO late last year.  She has been followed by Dr. Ellene Route for chronic low back pain. She has been followed closely by GYN.  Following her hysterectomy in November and apparently there has been some microscopic hematuria Her chief complaint is some bruising involving her left outer distal arm.  Apparently this is not related to trauma. Doing fairly well except for her chronic back pain. She has a history of OSA and vitamin D deficiency Continues to smoke tobacco products  Past Medical History:  Diagnosis Date  . ALLERGIC RHINITIS 03/14/2010  . BACK PAIN 01/24/2010  . Buttock pain   . GERD 12/01/2008  . HIATAL HERNIA 05/27/2010  . 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 Numbness and tingling of left upper and lower extremity   . OSTEOARTHRITIS 12/01/2008  . Sleep apnea   . Thigh pain   . 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 TOBACCO USE 12/01/2008  . VITAMIN D DEFICIENCY 05/23/2010     Social History   Social History  . Marital status: Legally Separated    Spouse name: N/A  . Number of children: N/A  . Years of education: N/A   Occupational History  . Not on file.   Social History Main Topics  . Smoking status: Current Every Day Smoker    Packs/day: 1.00    Years: 31.00    Types: Cigarettes  . Smokeless tobacco: Never Used  . Alcohol use Yes     Comment: social  . Drug use: No  . Sexual activity: Not on file   Other Topics Concern  . Not on file   Social History Narrative  . No narrative on file    Past Surgical History:  Procedure Laterality Date  . ABDOMINAL HYSTERECTOMY N/A 10/17/2016   Procedure: HYSTERECTOMY ABDOMINAL;  Surgeon: Sanjuana Kava, MD;  Location: [facility];  Service: Gynecology;  Laterality: N/A;  . APPENDECTOMY    . BREAST SURGERY     lumpectomy  . CERVICAL FUSION    . CESAREAN SECTION    . COLONOSCOPY    .  CYSTOSCOPY N/A 10/17/2016   Procedure: CYSTOSCOPY;  Surgeon: Sanjuana Kava, MD;  Location: [facility];  Service: Gynecology;  Laterality: N/A;  . DILATION AND CURETTAGE OF UTERUS     miscarriage  . ESOPHAGEAL MANOMETRY    . ESOPHAGOGASTRODUODENOSCOPY    . LAPAROSCOPIC NISSEN FUNDOPLICATION    . LUMBAR DISC SURGERY    . LUMBAR LAMINECTOMY    . SALPINGOOPHORECTOMY Bilateral 10/17/2016   Procedure: SALPINGO OOPHORECTOMY;  Surgeon: Sanjuana Kava, MD;  Location: [facility];  Service: Gynecology;  Laterality: Bilateral;  . TONSILLECTOMY AND ADENOIDECTOMY      No family history on file.  Allergies  Allergen Reactions  . Amoxicillin Nausea Only    Yeast Infection Has patient had a PCN reaction causing immediate rash, facial/tongue/throat swelling, SOB or lightheadedness with hypotension: no Has patient had a PCN reaction causing severe rash involving mucus membranes or skin necrosis: no Has patient had a PCN reaction that required hospitalization yes Has patient had a PCN reaction occurring within the last 10 years: yes If all of the above answers are "NO", then may proceed with Cephalosporin use.   Marland Kitchen Morphine     REACTION: hyper  . Torecan [Thiethylperazine]     Current Outpatient Prescriptions on File Prior to  Visit  Medication Sig Dispense Refill  . Biotin w/ Vitamins C & E (HAIR/SKIN/NAILS PO) Take 1 tablet by mouth every morning.    . Cholecalciferol (VITAMIN D-3) 1000 UNITS CAPS Take 1 capsule by mouth daily.    . cyclobenzaprine (FLEXERIL) 10 MG tablet Take 10 mg by mouth 3 (three) times daily as needed.     . diazepam (VALIUM) 5 MG tablet TAKE 1 TABLET BY MOUTH EVERY DAY AT BEDTIME AS NEEDED 90 tablet 0  . furosemide (LASIX) 20 MG tablet TAKE 1 TABLET BY MOUTH DAILY 90 tablet 0  . HYDROcodone-acetaminophen (NORCO) 7.5-325 MG tablet TAKE ONE TABLET BY MOUTH EVERY 6 HOURS AS NEEDED FOR PAIN 120 tablet 0  . Multiple Vitamin (MULTIVITAMIN) tablet Take 1 tablet by mouth daily.    Marland Kitchen venlafaxine XR  (EFFEXOR-XR) 150 MG 24 hr capsule Take 150 mg by mouth daily with breakfast.    . oxyCODONE 10 MG TABS Take 1 tablet (10 mg total) by mouth every 3 (three) hours as needed for moderate pain. (Patient not taking: Reported on 02/09/2017) 30 tablet 0   No current facility-administered medications on file prior to visit.     BP 132/74 (BP Location: Left Arm, Patient Position: Sitting, Cuff Size: Normal)   Pulse 84   Temp 98 F (36.7 C) (Oral)   Ht 5\' 5"  (1.651 m)   Wt 205 lb (93 kg)   LMP 09/27/2016 (Approximate)   SpO2 96%   BMI 34.11 kg/m     Review of Systems  Constitutional: Negative.   HENT: Negative for congestion, dental problem, hearing loss, rhinorrhea, sinus pressure, sore throat and tinnitus.   Eyes: Negative for pain, discharge and visual disturbance.  Respiratory: Negative for cough and shortness of breath.   Cardiovascular: Negative for chest pain, palpitations and leg swelling.  Gastrointestinal: Negative for abdominal distention, abdominal pain, blood in stool, constipation, diarrhea, nausea and vomiting.  Genitourinary: Negative for difficulty urinating, dysuria, flank pain, frequency, hematuria, pelvic pain, urgency, vaginal bleeding, vaginal discharge and vaginal pain.  Musculoskeletal: Positive for back pain. Negative for arthralgias, gait problem and joint swelling.  Skin: Positive for rash.  Neurological: Negative for dizziness, syncope, speech difficulty, weakness, numbness and headaches.  Hematological: Negative for adenopathy.  Psychiatric/Behavioral: Negative for agitation, behavioral problems and dysphoric mood. The patient is not nervous/anxious.        Objective:   Physical Exam  Constitutional: She is oriented to person, place, and time. She appears well-developed and well-nourished.  HENT:  Head: Normocephalic.  Right Ear: External ear normal.  Left Ear: External ear normal.  Mouth/Throat: Oropharynx is clear and moist.  Eyes: Conjunctivae and EOM  are normal. Pupils are equal, round, and reactive to light.  Neck: Normal range of motion. Neck supple. No thyromegaly present.  Cardiovascular: Normal rate, regular rhythm, normal heart sounds and intact distal pulses.   Pulmonary/Chest: Effort normal and breath sounds normal.  Abdominal: Soft. Bowel sounds are normal. She exhibits no mass. There is no tenderness. There is no rebound and no guarding.  Musculoskeletal: Normal range of motion.  Lymphadenopathy:    She has no cervical adenopathy.  Neurological: She is alert and oriented to person, place, and time.  Skin: Skin is warm and dry. No rash noted.  The scattered ecchymoses outer aspect of the left distal outer arm  Psychiatric: She has a normal mood and affect. Her behavior is normal.          Assessment & Plan:   Chronic  back pain.  Follow-up neurosurgery History microscopic hematuria.  Will review a urinalysis History of vitamin D deficiency.  We'll recheck a level Ecchymotic area left distal lower arm.  This appears to be more of a traumatic ecchymoses but does have some small scattered petechial type lesions.  Will check CBC to exclude thrombocytopenia  Nyoka Cowden

## 2017-02-09 NOTE — Progress Notes (Signed)
Pre visit review using our clinic review tool, if applicable. No additional management support is needed unless otherwise documented below in the visit note. 

## 2017-03-28 ENCOUNTER — Telehealth: Payer: Self-pay | Admitting: Internal Medicine

## 2017-03-28 NOTE — Telephone Encounter (Signed)
Pt needs new rx hydrocodone °

## 2017-03-30 ENCOUNTER — Other Ambulatory Visit: Payer: Self-pay

## 2017-03-30 MED ORDER — HYDROCODONE-ACETAMINOPHEN 7.5-325 MG PO TABS: ORAL_TABLET | ORAL | 0 refills | Status: DC

## 2017-03-30 NOTE — Telephone Encounter (Signed)
Rx printed awaiting the doctors signiture

## 2017-03-30 NOTE — Telephone Encounter (Signed)
Okay for refill?  

## 2017-04-03 NOTE — Telephone Encounter (Signed)
Pt notified Rx ready for pickup. Rx printed and signed.  

## 2017-05-10 ENCOUNTER — Other Ambulatory Visit: Payer: Self-pay | Admitting: Internal Medicine

## 2017-05-10 MED ORDER — HYDROCODONE-ACETAMINOPHEN 7.5-325 MG PO TABS: ORAL_TABLET | ORAL | 0 refills | Status: DC

## 2017-05-10 NOTE — Telephone Encounter (Signed)
Pt needs new rx hydrocodone °

## 2017-05-10 NOTE — Telephone Encounter (Signed)
Rx printed awaiting to be signed.  

## 2017-05-11 NOTE — Telephone Encounter (Signed)
Left message on machine Rx ready for pick up 

## 2017-06-28 ENCOUNTER — Telehealth: Payer: Self-pay | Admitting: Internal Medicine

## 2017-06-28 NOTE — Telephone Encounter (Signed)
Pt need new Rx for diazepam and Hydrocodone   Pt is aware of 3 business days for refills and someone will call when ready for pick up.

## 2017-07-03 MED ORDER — DIAZEPAM 5 MG PO TABS: ORAL_TABLET | ORAL | 0 refills | Status: DC

## 2017-07-03 MED ORDER — HYDROCODONE-ACETAMINOPHEN 7.5-325 MG PO TABS: ORAL_TABLET | ORAL | 0 refills | Status: DC

## 2017-07-03 NOTE — Telephone Encounter (Signed)
Rx was printed.

## 2017-07-04 NOTE — Telephone Encounter (Signed)
Pt is still waiting on rxs. Please call pt once rxs is ready for pick up

## 2017-07-05 NOTE — Telephone Encounter (Signed)
Noted, Rx will be printed tomorrow morning for dr K to sign, pt will be notified when its ready for pick up.

## 2017-07-05 NOTE — Telephone Encounter (Signed)
Rx was attached to one of Dr. Ansel Bong patient's information and never signed by Dr. Burnice Logan.  Rx needs to be reprinted and then signed first thing 07/06/2017.  Please contact the patient when ready for pick-up.

## 2017-07-06 ENCOUNTER — Other Ambulatory Visit: Payer: Self-pay | Admitting: Internal Medicine

## 2017-07-06 MED ORDER — DIAZEPAM 5 MG PO TABS: ORAL_TABLET | ORAL | 0 refills | Status: DC

## 2017-07-06 MED ORDER — HYDROCODONE-ACETAMINOPHEN 7.5-325 MG PO TABS: ORAL_TABLET | ORAL | 0 refills | Status: DC

## 2017-07-09 ENCOUNTER — Telehealth: Payer: Self-pay | Admitting: Internal Medicine

## 2017-07-09 NOTE — Telephone Encounter (Signed)
Pt is need new Rx for Chantix  Pharm:  [facility]

## 2017-07-11 MED ORDER — VARENICLINE TARTRATE 1 MG PO TABS: 1.0000 mg | ORAL_TABLET | Freq: Two times a day (BID) | ORAL | 2 refills | Status: DC

## 2017-07-11 NOTE — Telephone Encounter (Signed)
Okay for Chantix 1 mg #60, refill times 2, directions one twice daily

## 2017-07-11 NOTE — Addendum Note (Signed)
Addended by: Abelardo Diesel on: 07/11/2017 12:02 PM   Modules accepted: Orders

## 2017-07-11 NOTE — Telephone Encounter (Signed)
Rx was picked up by patient.

## 2017-07-11 NOTE — Telephone Encounter (Signed)
Medication is not in pt's chart. Please advise

## 2017-07-13 ENCOUNTER — Telehealth: Payer: Self-pay

## 2017-07-13 NOTE — Telephone Encounter (Signed)
Received PA request for Chantix. PA submitted & is pending. Key: [id number]

## 2017-07-16 NOTE — Telephone Encounter (Signed)
PA denied. Patient has to try bupropion.

## 2017-07-17 NOTE — Telephone Encounter (Signed)
Please advise 

## 2017-07-17 NOTE — Telephone Encounter (Signed)
Trial of bupropion 150 mg #60 one twice a day refill times 2

## 2017-07-18 MED ORDER — BUPROPION HCL ER (SR) 150 MG PO TB12: 150.0000 mg | ORAL_TABLET | Freq: Two times a day (BID) | ORAL | 2 refills | Status: DC

## 2017-07-18 NOTE — Telephone Encounter (Signed)
Pt is returning paty call °

## 2017-07-18 NOTE — Addendum Note (Signed)
Addended by: Abelardo Diesel on: 07/18/2017 11:09 AM   Modules accepted: Orders

## 2017-07-18 NOTE — Telephone Encounter (Signed)
Pt made aware

## 2017-07-18 NOTE — Telephone Encounter (Signed)
Called pt to inform. No answer and I was unable to leave a message.

## 2017-08-08 ENCOUNTER — Telehealth: Payer: Self-pay | Admitting: Internal Medicine

## 2017-08-08 NOTE — Telephone Encounter (Signed)
Pt request refill  HYDROcodone-acetaminophen (NORCO) 7.5-325 MG tablet   Also would like dr to know  buPROPion (WELLBUTRIN SR) 150 MG 12 hr tablet Is NOT working for her smoking issue.   Insurance had requested she try this med prior to approving  Chantix  now that pt has tried Wellbutrin and failed, would like [name] to resubmit the rx for chantix.

## 2017-08-10 MED ORDER — VARENICLINE TARTRATE 1 MG PO TABS: 1.0000 mg | ORAL_TABLET | Freq: Two times a day (BID) | ORAL | 2 refills | Status: DC

## 2017-08-10 MED ORDER — HYDROCODONE-ACETAMINOPHEN 7.5-325 MG PO TABS: ORAL_TABLET | ORAL | 0 refills | Status: DC

## 2017-08-10 MED ORDER — VARENICLINE TARTRATE 0.5 MG X 11 & 1 MG X 42 PO MISC: ORAL | 0 refills | Status: DC

## 2017-08-10 NOTE — Addendum Note (Signed)
Addended by: Abelardo Diesel on: 08/10/2017 04:29 PM   Modules accepted: Orders

## 2017-08-10 NOTE — Telephone Encounter (Signed)
Rx for HYDROcodone-acetaminophen ([place]) 7.5-325 MG tablet printed awaiting to be signed.    Please advise on Chantix

## 2017-08-10 NOTE — Telephone Encounter (Signed)
Okay for Chantix; Needs 2  Prescriptions:  1 for a 1 month starter pack; a second prescription for  Chantix 1 mg #60 one twice a day refill times 2

## 2017-08-15 ENCOUNTER — Telehealth: Payer: Self-pay | Admitting: Internal Medicine

## 2017-08-15 NOTE — Telephone Encounter (Signed)
Received PA request for Chantix. PA submitted & is pending. Key: [id number]

## 2017-09-04 ENCOUNTER — Other Ambulatory Visit: Payer: Self-pay | Admitting: Internal Medicine

## 2017-09-24 ENCOUNTER — Telehealth: Payer: Self-pay | Admitting: Internal Medicine

## 2017-09-24 NOTE — Telephone Encounter (Signed)
Pt need new Rx for Hydrocodone   Pt is aware of 3 business days for refills and someone will call when ready for pick up. °

## 2017-09-24 NOTE — Telephone Encounter (Signed)
A detailed voice message was left on pt voicemail stating that a pain management appointment is needed in order to refill hydrocodone.

## 2017-09-24 NOTE — Telephone Encounter (Signed)
The below letter was mailed to pt's home address.   "Dear patients, The Strengthen Opioid Misuse Prevention (STOP) Act of 2017 has been signed into law to fight the opioid problem that has had a major impact in [place] and the [place].  [facility] wants to be sure to follow the law while we continue to provide you with the exceptional care you have come to expect.   For most of you, nothing will change.  Those of you who get a regular long-term pain management prescription from one of our providers will need to schedule a separate pain management appointment.  At this appointment, your provider will talk you through the changes we have had to begin because of the STOP Act.  It's the law.  What this means for you is that now you will need to have a separate pain management visit with your provider at least every 3 months.  You will also need to sign an updated controlled substance contract that will be discussed with you in detail during your visit.   We know this change can be confusing and uncomfortable, but we are here to help you every step of the way.  Please contact us today to set up your pain management appointment.    Sincerely,   [facility] " 

## 2017-09-27 ENCOUNTER — Encounter: Payer: Self-pay | Admitting: Internal Medicine

## 2017-09-27 ENCOUNTER — Ambulatory Visit (INDEPENDENT_AMBULATORY_CARE_PROVIDER_SITE_OTHER): Payer: 59 | Admitting: Internal Medicine

## 2017-09-27 VITALS — BP 138/76 | HR 110 | Temp 98.3°F | Ht 65.0 in | Wt 217.6 lb

## 2017-09-27 DIAGNOSIS — M159 Polyosteoarthritis, unspecified: Secondary | ICD-10-CM

## 2017-09-27 DIAGNOSIS — M150 Primary generalized (osteo)arthritis: Secondary | ICD-10-CM | POA: Diagnosis not present

## 2017-09-27 DIAGNOSIS — G8929 Other chronic pain: Secondary | ICD-10-CM

## 2017-09-27 DIAGNOSIS — M8949 Other hypertrophic osteoarthropathy, multiple sites: Secondary | ICD-10-CM

## 2017-09-27 DIAGNOSIS — M549 Dorsalgia, unspecified: Secondary | ICD-10-CM | POA: Diagnosis not present

## 2017-09-27 MED ORDER — PHENTERMINE HCL 37.5 MG PO CAPS
37.5000 mg | ORAL_CAPSULE | ORAL | 2 refills | Status: DC
Start: 2017-09-27 — End: 2018-02-07

## 2017-09-27 MED ORDER — HYDROCODONE-ACETAMINOPHEN 7.5-325 MG PO TABS: ORAL_TABLET | ORAL | 0 refills | Status: DC

## 2017-09-27 NOTE — Patient Instructions (Signed)
Pain clinic referral as discussed  You need to lose weight.  Consider a lower calorie diet and regular exercise.

## 2017-09-27 NOTE — Telephone Encounter (Signed)
Left another detailed message stating to please make a pain medication appt in order to refill hydrocodone.

## 2017-09-27 NOTE — Telephone Encounter (Signed)
Pt came in today and received Rx's at a none pain management appointment.

## 2017-09-27 NOTE — Progress Notes (Signed)
Subjective:    Patient ID: Lori [name], female    DOB: Apr 12, 1966, 51 y.o.   MRN: [id number]  HPI  51 year old patient who presents today with a chief complaint of "pain all over".  She has been using Advil and hydrocodone and remains quite symptomatic.  She complains of neck and lumbar pain.  She also describes pain and paresthesias involving her hands.  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  She has been on hydrocodone 7.5 with minimal benefits.  In addition to Advil. She has been followed by orthopedics for bilateral knee pain.  She is followed by [facility] and also followed by Dr. Ellene Route.  She has had C-spine surgery in the past and she states that she is contemplating lumbar surgery. She describes generalized stiffness. She also complains of significant weight gain.  The patient states that hydrocodone, Advil combination is not very effective.  She is requesting a stronger analgesic  [place] printed, initialed  and scanned into the EMR. A new pain contract reviewed and signed  Indication for chronic opioid: patient complains of generalized pain, most marked in the cervical and lumbar area Medication and dose: hydrocodone 7.5-acetaminophen 325 mg with ibuprofen # pills per month: 120 Last UDS date: 09/27/2017 Pain contract signed (Y/N): yes Date narcotic database last reviewed (include red flags): 09/27/2017  Review of Systems  Constitutional: Positive for unexpected weight change.  HENT: Negative for congestion, dental problem, hearing loss, rhinorrhea, sinus pressure, sore throat and tinnitus.   Eyes: Negative for pain, discharge and visual disturbance.  Respiratory: Negative for cough and shortness of breath.   Cardiovascular: Negative for chest pain, palpitations and leg swelling.  Gastrointestinal: Negative for abdominal distention, abdominal pain, blood in stool, constipation, diarrhea,  nausea and vomiting.  Genitourinary: Negative for difficulty urinating, dysuria, flank pain, frequency, hematuria, pelvic pain, urgency, vaginal bleeding, vaginal discharge and vaginal pain.  Musculoskeletal: Positive for arthralgias, back pain, neck pain and neck stiffness. Negative for gait problem and joint swelling.  Skin: Negative for rash.  Neurological: Negative for dizziness, syncope, speech difficulty, weakness, numbness and headaches.  Hematological: Negative for adenopathy.  Psychiatric/Behavioral: Negative for agitation, behavioral problems and dysphoric mood. The patient is not nervous/anxious.        Objective:   Physical Exam  Constitutional: She is oriented to person, place, and time. She appears well-developed and well-nourished.  Transfers easily from a sitting position to the examining table Blood pressure normal    HENT:  Head: Normocephalic.  Right Ear: External ear normal.  Left Ear: External ear normal.  Mouth/Throat: Oropharynx is clear and moist.  Eyes: Pupils are equal, round, and reactive to light. Conjunctivae and EOM are normal.  Neck: Normal range of motion. Neck supple. No thyromegaly present.  Cardiovascular: Normal rate, regular rhythm, normal heart sounds and intact distal pulses.   Pulmonary/Chest: Effort normal and breath sounds normal.  Abdominal: Soft. Bowel sounds are normal. She exhibits no mass. There is no tenderness.  Musculoskeletal: Normal range of motion.  Trigger finger, right thumb  Lymphadenopathy:    She has no cervical adenopathy.  Neurological: She is alert and oriented to person, place, and time.  Skin: Skin is warm and dry. No rash noted.  Psychiatric: She has a normal mood and affect. Her behavior is normal.          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  Will continue with wrist splints.  Orthopedic  follow-up Bilateral knee pain  Obesity.  Patient requested trial of phentermine  Encounter for chronic pain management (G89.29) Narcotic use  (711.90) Pain management contract signed (Z02.89)  Refer for pain management clinic  [facility]

## 2017-10-29 ENCOUNTER — Ambulatory Visit: Payer: 59 | Admitting: Internal Medicine

## 2017-10-29 ENCOUNTER — Encounter: Payer: Self-pay | Admitting: Internal Medicine

## 2017-10-29 VITALS — BP 138/80 | HR 81 | Temp 98.3°F | Ht 65.0 in | Wt 215.4 lb

## 2017-10-29 DIAGNOSIS — Z79899 Other long term (current) drug therapy: Secondary | ICD-10-CM | POA: Diagnosis not present

## 2017-10-29 DIAGNOSIS — G8929 Other chronic pain: Secondary | ICD-10-CM

## 2017-10-29 MED ORDER — HYDROCODONE-ACETAMINOPHEN 7.5-325 MG PO TABS: ORAL_TABLET | ORAL | 0 refills | Status: DC

## 2017-10-29 NOTE — Patient Instructions (Signed)
Chronic pain management clinic as scheduled Orthopedic follow-up

## 2017-10-29 NOTE — Progress Notes (Signed)
   Subjective:    Patient ID: DISNEY RUGGIERO, female    DOB: 16-Sep-1966, 51 y.o.   MRN: [id number]  HPI  51 year old patient who is seen today for chronic pain management per opioid protocol  [place] printed, initialed  and scanned into the EMR.   Indication for chronic opioid: Patient has been on hydrocodone for chronic low back pain.  More recently she has had pain involving the right hand that she attributes to overuse at work.  She has been evaluated for Workmen's Comp. Medication and dose: Hydrocodone 7.5-acetaminophen 325 1-1 1/2 tablets daily 3-4 times per day maximum 4 tablets/day # pills per month: 120 Last UDS date: October 29, 2017 Pain contract signed (Y/N): Yes Date narcotic database last reviewed (include red flags): October 29, 2017  Review of Systems     Objective:   Physical Exam        Assessment & Plan:    Encounter for chronic pain management (G89.29) Narcotic use  (711.90) Pain management contract signed (Z02.89)  The patient does have an appointment in January to follow-up with [facility] pain management 2 months of hydrocodone dispensed  Nyoka Cowden

## 2017-11-03 LAB — PAIN MGMT, PROFILE 8 W/CONF, U
6 Acetylmorphine: NEGATIVE ng/mL (ref <10)
Alcohol Metabolites: NEGATIVE ng/mL (ref <500)
Alphahydroxyalprazolam: NEGATIVE ng/mL (ref <25)
Alphahydroxymidazolam: NEGATIVE ng/mL (ref <50)
Alphahydroxytriazolam: NEGATIVE ng/mL (ref <50)
Aminoclonazepam: NEGATIVE ng/mL (ref <25)
Amphetamines: NEGATIVE ng/mL (ref <500)
Benzodiazepines: NEGATIVE ng/mL (ref <100)
Buprenorphine, Urine: NEGATIVE ng/mL (ref <5)
Cocaine Metabolite: NEGATIVE ng/mL (ref <150)
Codeine: NEGATIVE ng/mL (ref <50)
Creatinine: 15.4 mg/dL — ABNORMAL LOW
Hydrocodone: 381 ng/mL — ABNORMAL HIGH (ref <50)
Hydromorphone: 127 ng/mL — ABNORMAL HIGH (ref <50)
Hydroxyethylflurazepam: NEGATIVE ng/mL (ref <50)
Lorazepam: NEGATIVE ng/mL (ref <50)
MDMA: NEGATIVE ng/mL (ref <500)
Marijuana Metabolite: NEGATIVE ng/mL (ref <20)
Morphine: NEGATIVE ng/mL (ref <50)
Nordiazepam: NEGATIVE ng/mL (ref <50)
Norhydrocodone: 260 ng/mL — ABNORMAL HIGH (ref <50)
Opiates: POSITIVE ng/mL — AB (ref <100)
Oxazepam: NEGATIVE ng/mL (ref <50)
Oxidant: NEGATIVE ug/mL (ref <200)
Oxycodone: NEGATIVE ng/mL (ref <100)
Specific Gravity: 1.006 (ref >=1.0)
Temazepam: NEGATIVE ng/mL (ref <50)
pH: 7.02 (ref 4.5–9.0)

## 2017-11-21 ENCOUNTER — Telehealth: Payer: Self-pay | Admitting: Internal Medicine

## 2017-11-21 DIAGNOSIS — M1990 Unspecified osteoarthritis, unspecified site: Secondary | ICD-10-CM

## 2017-11-21 NOTE — Telephone Encounter (Signed)
Pt dropped DMV form off to be filled out and faxed form to [phone] or [phone] form was put in dr's folder.

## 2017-12-24 ENCOUNTER — Telehealth: Payer: Self-pay | Admitting: Internal Medicine

## 2017-12-24 NOTE — Telephone Encounter (Signed)
Copied from [place] [id number]. Topic: Quick Communication - See Telephone Encounter >> Dec 24, 2017  8:13 AM Ether Griffins B wrote: CRM for notification. See Telephone encounter for:  Pt states  Dr. Burnice Logan missed page F on her paper work for the hydrocodone and her license has been suspended until paper is filled out and taken to [facility]. Pt had to miss work today to get this taken care of. Pt is getting ready to come to the office with paper work   12/24/17.

## 2017-12-25 ENCOUNTER — Encounter: Payer: Self-pay | Admitting: Internal Medicine

## 2017-12-25 ENCOUNTER — Ambulatory Visit (INDEPENDENT_AMBULATORY_CARE_PROVIDER_SITE_OTHER): Payer: 59 | Admitting: Internal Medicine

## 2017-12-25 VITALS — BP 132/76 | HR 94 | Temp 98.2°F | Ht 65.0 in | Wt 218.6 lb

## 2017-12-25 DIAGNOSIS — M150 Primary generalized (osteo)arthritis: Secondary | ICD-10-CM | POA: Diagnosis not present

## 2017-12-25 DIAGNOSIS — M8949 Other hypertrophic osteoarthropathy, multiple sites: Secondary | ICD-10-CM

## 2017-12-25 DIAGNOSIS — M159 Polyosteoarthritis, unspecified: Secondary | ICD-10-CM

## 2017-12-25 NOTE — Patient Instructions (Signed)
Call or return to clinic prn if these symptoms worsen or fail to improve as anticipated. Follow-up chronic pain management clinic as scheduled

## 2017-12-25 NOTE — Telephone Encounter (Signed)
Pt has an OV today. All concerns were addressed.

## 2017-12-25 NOTE — Progress Notes (Signed)
   Subjective:    Patient ID: Lori [name], female    DOB: May 21, 1966, 52 y.o.   MRN: [id number]  HPI  52 year old patient who is followed by chronic pain management due to low back pain.  Medical regimen includes hydrocodone as well as Flexeril and occasional diazepam. DMV forms were completed recently and the patient received a letter stating that the form was incomplete.  The form was reviewed and was unsigned by me. The patient was concerned that her driving privileges may be revoked due to the listing of her present medications for back pain.    Review of Systems  Constitutional: Negative.   HENT: Negative for congestion, dental problem, hearing loss, rhinorrhea, sinus pressure, sore throat and tinnitus.   Eyes: Negative for pain, discharge and visual disturbance.  Respiratory: Negative for cough and shortness of breath.   Cardiovascular: Negative for chest pain, palpitations and leg swelling.  Gastrointestinal: Negative for abdominal distention, abdominal pain, blood in stool, constipation, diarrhea, nausea and vomiting.  Genitourinary: Negative for difficulty urinating, dysuria, flank pain, frequency, hematuria, pelvic pain, urgency, vaginal bleeding, vaginal discharge and vaginal pain.  Musculoskeletal: Positive for arthralgias and back pain. Negative for gait problem and joint swelling.  Skin: Negative for rash.  Neurological: Negative for dizziness, syncope, speech difficulty, weakness, numbness and headaches.  Hematological: Negative for adenopathy.  Psychiatric/Behavioral: Negative for agitation, behavioral problems and dysphoric mood. The patient is not nervous/anxious.        Objective:   Physical Exam  No distress Blood pressure 130/76         Assessment & Plan:   Form completion.  DMV forms completed and signed  Chronic low back pain.  Patient will follow up with chronic pain management OSA Osteoarthritis  Lori [name]

## 2018-02-06 ENCOUNTER — Ambulatory Visit: Payer: 59 | Admitting: Adult Health

## 2018-02-07 ENCOUNTER — Encounter: Payer: Self-pay | Admitting: Adult Health

## 2018-02-07 ENCOUNTER — Ambulatory Visit: Payer: 59 | Admitting: Adult Health

## 2018-02-07 DIAGNOSIS — G4733 Obstructive sleep apnea (adult) (pediatric): Secondary | ICD-10-CM | POA: Diagnosis not present

## 2018-02-07 NOTE — Addendum Note (Signed)
Addended by: Parke Poisson E on: 02/07/2018 10:11 AM   Modules accepted: Orders

## 2018-02-07 NOTE — Progress Notes (Signed)
@Patient  ID: Lori [name], female    DOB: [birth year], 52 y.o.   MRN: [id number]  Chief Complaint  Patient presents with  . Follow-up    OSA     Referring provider: Marletta Lor, MD  HPI: 52 yo female smoker followed for OSA   TEST  home sleep test which showed an AHI of 5. (June 2017).    02/07/2018 Follow up: OSA  Patient presents for a one year follow-up for sleep apnea.  Patient is on CPAP at bedtime. Says she feels rested with no significant daytime sleepiness.  Download shows excellent compliance with average usage at 5.5 hours.  Patient is on CPAP AutoSet 5-10 cm H2O.  AHI 0.5.  Minimal leaks. Was having some issues with nasal mask with irritation on bridge of nose. Now trying nasal pillows which are helping a lot .    Allergies  Allergen Reactions  . Amoxicillin Nausea Only    Yeast Infection Has patient had a PCN reaction causing immediate rash, facial/tongue/throat swelling, SOB or lightheadedness with hypotension: no Has patient had a PCN reaction causing severe rash involving mucus membranes or skin necrosis: no Has patient had a PCN reaction that required hospitalization yes Has patient had a PCN reaction occurring within the last 10 years: yes If all of the above answers are "NO", then may proceed with Cephalosporin use.   Marland Kitchen Morphine     REACTION: hyper  . Torecan [Thiethylperazine]     Immunization History  Administered Date(s) Administered  . Tdap 12/23/2013    Past Medical History:  Diagnosis Date  . ALLERGIC RHINITIS 03/14/2010  . BACK PAIN 01/24/2010  . Buttock pain   . GERD 12/01/2008  . HIATAL HERNIA 05/27/2010  . 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 Numbness and tingling of left upper and lower extremity   . OSTEOARTHRITIS 12/01/2008  . Sleep apnea   . Thigh pain   . 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 TOBACCO USE 12/01/2008  . VITAMIN D DEFICIENCY 05/23/2010    Tobacco History: Social History   Tobacco Use  Smoking Status Current  Every Day Smoker  . Packs/day: 1.00  . Years: 31.00  . Pack years: 31.00  . Types: Cigarettes  Smokeless Tobacco Never Used   Ready to quit: No Counseling given: Yes   Outpatient Encounter Medications as of 02/07/2018  Medication Sig  . Biotin w/ Vitamins C & E (HAIR/SKIN/NAILS PO) Take 1 tablet by mouth every morning.  . Cholecalciferol (VITAMIN D-3) 5000 units TABS Take 1 capsule by mouth daily.  . diazepam (VALIUM) 5 MG tablet TAKE 1 TABLET BY MOUTH EVERY DAY AT BEDTIME AS NEEDED  . fluticasone (FLONASE) 50 MCG/ACT nasal spray Place 2 sprays into both nostrils daily.  . Misc Natural Products (OSTEO BI-FLEX ADV JOINT SHIELD) TABS Take 1 tablet by mouth daily.  . Multiple Vitamin (MULTIVITAMIN) tablet Take 1 tablet by mouth daily.  Marland Kitchen oxyCODONE-acetaminophen (PERCOCET) 10-325 MG tablet Take 1 tablet by mouth every 6 (six) hours as needed for pain.  Marland Kitchen venlafaxine XR (EFFEXOR-XR) 150 MG 24 hr capsule Take 150 mg by mouth at bedtime.   . cyclobenzaprine (FLEXERIL) 10 MG tablet Take 10 mg by mouth 3 (three) times daily as needed.   . furosemide (LASIX) 20 MG tablet TAKE 1 TABLET BY MOUTH DAILY (Patient not taking: Reported on 02/07/2018)  . tolterodine (DETROL) 2 MG tablet Take 2 mg by mouth daily as needed.   . [DISCONTINUED] HYDROcodone-acetaminophen (NORCO) 7.5-325 MG tablet  TAKE ONE TABLET BY MOUTH EVERY 6 HOURS AS NEEDED FOR PAIN (Patient not taking: Reported on 02/07/2018)  . [DISCONTINUED] phentermine 37.5 MG capsule Take 1 capsule (37.5 mg total) by mouth every morning. (Patient not taking: Reported on 02/07/2018)   No facility-administered encounter medications on file as of 02/07/2018.      Review of Systems  Constitutional:   No  weight loss, night sweats,  Fevers, chills, fatigue, or  lassitude.  HEENT:   No headaches,  Difficulty swallowing,  Tooth/dental problems, or  Sore throat,                No sneezing, itching, ear ache, nasal congestion, post nasal drip,   CV:  No  chest pain,  Orthopnea, PND, swelling in lower extremities, anasarca, dizziness, palpitations, syncope.   GI  No heartburn, indigestion, abdominal pain, nausea, vomiting, diarrhea, change in bowel habits, loss of appetite, bloody stools.   Resp: No shortness of breath with exertion or at rest.  No excess mucus, no productive cough,  No non-productive cough,  No coughing up of blood.  No change in color of mucus.  No wheezing.  No chest wall deformity  Skin: no rash or lesions.  GU: no dysuria, change in color of urine, no urgency or frequency.  No flank pain, no hematuria   MS:  + joint pains     Physical Exam  Pulse 78   Ht 5\' 5"  (1.651 m)   Wt 226 lb 12.8 oz (102.9 kg)   LMP 09/27/2016 (Approximate)   SpO2 98%   BMI 37.74 kg/m   GEN: A/Ox3; pleasant , NAD, obesity    HEENT:  [place]/AT,  EACs-clear, TMs-wnl, NOSE-clear, THROAT-clear, no lesions, no postnasal drip or exudate noted. Class 2-3 MP airway   NECK:  Supple w/ fair ROM; no JVD; normal carotid impulses w/o bruits; no thyromegaly or nodules palpated; no lymphadenopathy.    RESP  Clear  P & A; w/o, wheezes/ rales/ or rhonchi. no accessory muscle use, no dullness to percussion  CARD:  RRR, no m/r/g, no peripheral edema, pulses intact, no cyanosis or clubbing.  GI:   Soft & nt; nml bowel sounds; no organomegaly or masses detected.   Musco: Warm bil, no deformities or joint swelling noted.   Neuro: alert, no focal deficits noted.    Skin: Warm, no lesions or rashes    Lab Results:  CBC    Component Value Date/Time   WBC 11.3 (H) 02/09/2017 1116   RBC 4.31 02/09/2017 1116   HGB 13.7 02/09/2017 1116   HCT 40.7 02/09/2017 1116   PLT 230.0 02/09/2017 1116   MCV 94.5 02/09/2017 1116   MCH 33.2 10/18/2016 0546   MCHC 33.6 02/09/2017 1116   RDW 14.9 02/09/2017 1116   LYMPHSABS 4.0 02/09/2017 1116   MONOABS 0.6 02/09/2017 1116   EOSABS 0.2 02/09/2017 1116   BASOSABS 0.1 02/09/2017 1116    BMET    Component  Value Date/Time   NA 140 10/06/2016 0922   K 4.0 10/06/2016 0922   CL 105 10/06/2016 0922   CO2 26 10/06/2016 0922   GLUCOSE 87 10/06/2016 0922   BUN 14 10/06/2016 0922   CREATININE 0.79 10/17/2016 1722   CALCIUM 10.1 10/06/2016 0922   GFRNONAA >60 10/17/2016 1722   GFRAA >60 10/17/2016 1722    BNP No results found for: BNP  ProBNP No results found for: PROBNP  Imaging: No results found.   Assessment & Plan:   OSA (obstructive  sleep apnea) Controlled on CPAP  Plan  Patient Instructions  Continue on CPAP At bedtime   Work on healthy weight .  Do not drive if sleepy .  Follow up with Dr. Elsworth Soho  In 1 year and As needed        Morbid obesity [facility]) Wt loss      Rexene Edison, NP 02/07/2018

## 2018-02-07 NOTE — Assessment & Plan Note (Addendum)
Controlled on CPAP  Plan  Patient Instructions  Continue on CPAP At bedtime   Work on healthy weight .  Do not drive if sleepy .  Follow up with Dr. Elsworth Soho  In 1 year and As needed

## 2018-02-07 NOTE — Assessment & Plan Note (Signed)
Wt loss  

## 2018-02-07 NOTE — Patient Instructions (Signed)
Continue on CPAP At bedtime   Work on healthy weight .  Do not drive if sleepy .  Follow up with Dr. Elsworth Soho  In 1 year and As needed

## 2018-03-28 ENCOUNTER — Other Ambulatory Visit: Payer: Self-pay | Admitting: Internal Medicine

## 2018-03-28 NOTE — Telephone Encounter (Signed)
Copied from [place] [id number]. Topic: Quick Communication - Rx Refill/Question >> Mar 28, 2018 11:19 AM Aurelio Brash B wrote: Medication: diazepam (VALIUM) 5 MG tablet   Has the patient contacted their pharmacy? No  (Agent: If no, request that the patient contact the pharmacy for the refill.)  Preferred Pharmacy (with phone number or street name): [id number] - [place], [place]. AT [address] [phone] (Phone) [phone] (Fax)      Agent: Please be advised that RX refills may take up to 3 business days. We ask that you follow-up with your pharmacy.

## 2018-03-28 NOTE — Telephone Encounter (Signed)
Diazepam LOV: 12/15/17 PCP: Dr Inda Merlin Pharmacy: [facility] [address] [place]

## 2018-07-24 ENCOUNTER — Encounter: Payer: Self-pay | Admitting: Internal Medicine

## 2018-07-24 ENCOUNTER — Ambulatory Visit: Payer: 59 | Admitting: Internal Medicine

## 2018-07-24 VITALS — BP 140/70 | HR 87 | Temp 98.1°F | Wt 226.4 lb

## 2018-07-24 DIAGNOSIS — M159 Polyosteoarthritis, unspecified: Secondary | ICD-10-CM

## 2018-07-24 DIAGNOSIS — E559 Vitamin D deficiency, unspecified: Secondary | ICD-10-CM

## 2018-07-24 DIAGNOSIS — J301 Allergic rhinitis due to pollen: Secondary | ICD-10-CM | POA: Diagnosis not present

## 2018-07-24 DIAGNOSIS — M8949 Other hypertrophic osteoarthropathy, multiple sites: Secondary | ICD-10-CM

## 2018-07-24 DIAGNOSIS — G8929 Other chronic pain: Secondary | ICD-10-CM

## 2018-07-24 DIAGNOSIS — E785 Hyperlipidemia, unspecified: Secondary | ICD-10-CM | POA: Diagnosis not present

## 2018-07-24 DIAGNOSIS — M549 Dorsalgia, unspecified: Secondary | ICD-10-CM

## 2018-07-24 DIAGNOSIS — L309 Dermatitis, unspecified: Secondary | ICD-10-CM

## 2018-07-24 DIAGNOSIS — M150 Primary generalized (osteo)arthritis: Secondary | ICD-10-CM | POA: Diagnosis not present

## 2018-07-24 MED ORDER — DIAZEPAM 5 MG PO TABS: ORAL_TABLET | ORAL | 0 refills | Status: DC

## 2018-07-24 MED ORDER — TRIAMCINOLONE ACETONIDE 0.1 % EX CREA: 1.0000 "application " | TOPICAL_CREAM | Freq: Two times a day (BID) | CUTANEOUS | 1 refills | Status: DC

## 2018-07-24 NOTE — Patient Instructions (Addendum)
Dermatology follow-up if unimproved  Follow-up chronic pain management Lichen Planus Lichen planus is a skin problem that causes redness, itching, small bumps, and sores. It can affect the skin in any area of the body. Some common areas affected include:  Arms, wrists, legs, or ankles.  Chest, back, or abdomen.  Genital areas such as the vulva and vagina.  Gums and inside of the mouth.  Scalp.  Fingernails or toenails.  Treatment can help control the symptoms of this condition. The condition can last for a long time. It can take 6-18 months for it to go away. What are the causes? The exact cause of this condition is not known. The condition is not passed from one person to another (not contagious). It may be related to an allergy or an autoimmune response. An autoimmune response occurs when the body's defense system (immune system) mistakenly attacks healthy tissues. What increases the risk? This condition is more likely to develop in:  People who are older than 53 years of age.  People who take certain medicines.  People who have been exposed to certain dyes or chemicals.  People with hepatitis C.  What are the signs or symptoms? Symptoms of this condition may include:  Itching, which can be severe.  Small reddish or purple bumps on the skin. These may have flat tops and may be round or irregular shaped.  Redness or white patches on the gums or tongue.  Redness, soreness, or a burning feeling in the genital area. This may lead to pain or bleeding during sex.  Changes in the fingernails or toenails. The nails may become thin or rough. They may have ridges in them.  Redness or irritation of the eyes. This is rare.  How is this diagnosed? This condition may be diagnosed based on:  A physical exam. The health care provider will examine your affected skin and check for changes inside your mouth.  Removal of a tissue sample (biopsy sample) to be looked at under a  microscope.  How is this treated? Treatment for this condition may depend on the severity of symptoms. In some cases, no treatment is needed. If treatment is needed to control symptoms, it may include:  Creams or ointments (topical steroids) to help control itching and irritation.  Medicine to be taken by mouth.  A treatment in which your skin is exposed to ultraviolet light (phototherapy).  Lozenges that you suck on to help treat sores in the mouth.  Follow these instructions at home:  Take over-the-counter and prescription medicines only as told by your health care provider.  Use creams or ointments as told by your health care provider.  Do not scratch the affected areas of skin.  Women should keep the vaginal area as clean and dry as possible. Contact a health care provider if:  You have increasing redness, swelling, or pain in the affected area.  You have fluid, blood, or pus coming from the affected area.  Your eyes become irritated. This information is not intended to replace advice given to you by your health care provider. Make sure you discuss any questions you have with your health care provider. Document Released: 04/20/2011 Document Revised: 05/04/2016 Document Reviewed: 02/22/2015 Elsevier Interactive Patient Education  [employer].

## 2018-07-24 NOTE — Progress Notes (Signed)
Subjective:    Patient ID: Lori [name], female    DOB: Jun 08, 1966, 52 y.o.   MRN: [id number]  HPI  52 year old patient who is followed by chronic pain management with chronic low back pain. She presents with a chief complaint of a 12-month history of a pruritic rash involving her ankles and dorsal aspects of both feet. She has tried a number of topical products without benefit.  Past Medical History:  Diagnosis Date  . ALLERGIC RHINITIS 03/14/2010  . BACK PAIN 01/24/2010  . Buttock pain   . GERD 12/01/2008  . HIATAL HERNIA 05/27/2010  . 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 Numbness and tingling of left upper and lower extremity   . OSTEOARTHRITIS 12/01/2008  . Sleep apnea   . Thigh pain   . 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 TOBACCO USE 12/01/2008  . VITAMIN D DEFICIENCY 05/23/2010     Social History   Socioeconomic History  . Marital status: Legally Separated    Spouse name: Not on file  . Number of children: Not on file  . Years of education: Not on file  . Highest education level: Not on file  Occupational History  . Not on file  Social Needs  . Financial resource strain: Not on file  . Food insecurity:    Worry: Not on file    Inability: Not on file  . Transportation needs:    Medical: Not on file    Non-medical: Not on file  Tobacco Use  . Smoking status: Current Every Day Smoker    Packs/day: 1.00    Years: 31.00    Pack years: 31.00    Types: Cigarettes  . Smokeless tobacco: Never Used  Substance and Sexual Activity  . Alcohol use: Yes    Comment: social  . Drug use: No  . Sexual activity: Not on file  Lifestyle  . Physical activity:    Days per week: Not on file    Minutes per session: Not on file  . Stress: Not on file  Relationships  . Social connections:    Talks on phone: Not on file    Gets together: Not on file    Attends religious service: Not on file    Active member of club or organization: Not on file    Attends meetings of clubs or  organizations: Not on file    Relationship status: Not on file  . Intimate partner violence:    Fear of current or ex partner: Not on file    Emotionally abused: Not on file    Physically abused: Not on file    Forced sexual activity: Not on file  Other Topics Concern  . Not on file  Social History Narrative  . Not on file    Past Surgical History:  Procedure Laterality Date  . ABDOMINAL HYSTERECTOMY N/A 10/17/2016   Procedure: HYSTERECTOMY ABDOMINAL;  Surgeon: Sanjuana Kava, MD;  Location: [facility];  Service: Gynecology;  Laterality: N/A;  . APPENDECTOMY    . BREAST SURGERY     lumpectomy  . CERVICAL FUSION    . CESAREAN SECTION    . COLONOSCOPY    . CYSTOSCOPY N/A 10/17/2016   Procedure: CYSTOSCOPY;  Surgeon: Sanjuana Kava, MD;  Location: [facility];  Service: Gynecology;  Laterality: N/A;  . DILATION AND CURETTAGE OF UTERUS     miscarriage  . ESOPHAGEAL MANOMETRY    . ESOPHAGOGASTRODUODENOSCOPY    . LAPAROSCOPIC NISSEN FUNDOPLICATION    . LUMBAR DISC  SURGERY    . LUMBAR LAMINECTOMY    . SALPINGOOPHORECTOMY Bilateral 10/17/2016   Procedure: SALPINGO OOPHORECTOMY;  Surgeon: Sanjuana Kava, MD;  Location: [facility];  Service: Gynecology;  Laterality: Bilateral;  . TONSILLECTOMY AND ADENOIDECTOMY      History reviewed. No pertinent family history.  Allergies  Allergen Reactions  . Amoxicillin Nausea Only    Yeast Infection Has patient had a PCN reaction causing immediate rash, facial/tongue/throat swelling, SOB or lightheadedness with hypotension: no Has patient had a PCN reaction causing severe rash involving mucus membranes or skin necrosis: no Has patient had a PCN reaction that required hospitalization yes Has patient had a PCN reaction occurring within the last 10 years: yes If all of the above answers are "NO", then may proceed with Cephalosporin use.   . Latex   . Morphine     REACTION: hyper  . Torecan [Thiethylperazine]     Current Outpatient Medications on File Prior to  Visit  Medication Sig Dispense Refill  . Biotin w/ Vitamins C & E (HAIR/SKIN/NAILS PO) Take 1 tablet by mouth every morning.    . Cholecalciferol (VITAMIN D-3) 5000 units TABS Take 1 capsule by mouth daily.    . cyclobenzaprine (FLEXERIL) 10 MG tablet Take 10 mg by mouth 3 (three) times daily as needed.     . fluticasone (FLONASE) 50 MCG/ACT nasal spray Place 2 sprays into both nostrils daily.    . furosemide (LASIX) 20 MG tablet TAKE 1 TABLET BY MOUTH DAILY 90 tablet 0  . Misc Natural Products (OSTEO BI-FLEX ADV JOINT SHIELD) TABS Take 1 tablet by mouth daily.    . Multiple Vitamin (MULTIVITAMIN) tablet Take 1 tablet by mouth daily.    Marland Kitchen oxyCODONE-acetaminophen (PERCOCET) 10-325 MG tablet Take 1 tablet by mouth every 6 (six) hours as needed for pain.    Marland Kitchen tolterodine (DETROL) 2 MG tablet Take 2 mg by mouth daily as needed.     . venlafaxine XR (EFFEXOR-XR) 150 MG 24 hr capsule Take 150 mg by mouth at bedtime.      No current facility-administered medications on file prior to visit.     BP 140/70 (BP Location: Right Arm, Patient Position: Sitting, Cuff Size: Large)   Pulse 87   Temp 98.1 F (36.7 C) (Oral)   Wt 226 lb 6.4 oz (102.7 kg)   LMP 09/27/2016 (Approximate)   SpO2 96%   BMI 37.67 kg/m     Review of Systems  Constitutional: Negative.   HENT: Negative for congestion, dental problem, hearing loss, rhinorrhea, sinus pressure, sore throat and tinnitus.   Eyes: Negative for pain, discharge and visual disturbance.  Respiratory: Negative for cough and shortness of breath.   Cardiovascular: Negative for chest pain, palpitations and leg swelling.  Gastrointestinal: Negative for abdominal distention, abdominal pain, blood in stool, constipation, diarrhea, nausea and vomiting.  Genitourinary: Negative for difficulty urinating, dysuria, flank pain, frequency, hematuria, pelvic pain, urgency, vaginal bleeding, vaginal discharge and vaginal pain.  Musculoskeletal: Positive for back  pain. Negative for arthralgias, gait problem and joint swelling.  Skin: Positive for rash.  Neurological: Negative for dizziness, syncope, speech difficulty, weakness, numbness and headaches.  Hematological: Negative for adenopathy.  Psychiatric/Behavioral: Negative for agitation, behavioral problems and dysphoric mood. The patient is not nervous/anxious.        Objective:   Physical Exam  Constitutional: She appears well-developed and well-nourished. No distress.  Skin:  Scattered 2 to 3 mm scattered non-blanching papular lesions involving the ankles and dorsal aspect  of the feet No involvement in the intertriginous region of the toes or the plantar feet          Assessment & Plan:   Pruritic papular dermatitis of the feet and ankles.  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  Will try triamcinolone twice daily.  Dermatology referral if unimproved  Lori [name]

## 2018-07-25 LAB — COMPREHENSIVE METABOLIC PANEL
ALT: 66 U/L — ABNORMAL HIGH (ref 0–35)
AST: 77 U/L — ABNORMAL HIGH (ref 0–37)
Albumin: 4.8 g/dL (ref 3.5–5.2)
Alkaline Phosphatase: 93 U/L (ref 39–117)
BUN: 11 mg/dL (ref 6–23)
CO2: 29 mEq/L (ref 19–32)
Calcium: 10.5 mg/dL (ref 8.4–10.5)
Chloride: 101 mEq/L (ref 96–112)
Creatinine, Ser: 0.82 mg/dL (ref 0.40–1.20)
GFR: 77.38 mL/min (ref >60.00)
Glucose, Bld: 104 mg/dL — ABNORMAL HIGH (ref 70–99)
Potassium: 4.4 mEq/L (ref 3.5–5.1)
Sodium: 139 mEq/L (ref 135–145)
Total Bilirubin: 0.5 mg/dL (ref 0.2–1.2)
Total Protein: 7.5 g/dL (ref 6.0–8.3)

## 2018-07-25 LAB — CBC WITH DIFFERENTIAL/PLATELET
Basophils Absolute: 0.2 10*3/uL — ABNORMAL HIGH (ref 0.0–0.1)
Basophils Relative: 2.4 % (ref 0.0–3.0)
Eosinophils Absolute: 0.2 10*3/uL (ref 0.0–0.7)
Eosinophils Relative: 2 % (ref 0.0–5.0)
HCT: 44.8 % (ref 36.0–46.0)
Hemoglobin: 15.1 g/dL — ABNORMAL HIGH (ref 12.0–15.0)
Lymphocytes Relative: 43.1 % (ref 12.0–46.0)
Lymphs Abs: 4.4 10*3/uL — ABNORMAL HIGH (ref 0.7–4.0)
MCHC: 33.8 g/dL (ref 30.0–36.0)
MCV: 97.2 fl (ref 78.0–100.0)
Monocytes Absolute: 0.6 10*3/uL (ref 0.1–1.0)
Monocytes Relative: 5.6 % (ref 3.0–12.0)
Neutro Abs: 4.8 10*3/uL (ref 1.4–7.7)
Neutrophils Relative %: 46.9 % (ref 43.0–77.0)
Platelets: 221 10*3/uL (ref 150.0–400.0)
RBC: 4.61 Mil/uL (ref 3.87–5.11)
RDW: 14.7 % (ref 11.5–15.5)
WBC: 10.3 10*3/uL (ref 4.0–10.5)

## 2018-07-25 LAB — LIPID PANEL
Cholesterol: 260 mg/dL — ABNORMAL HIGH (ref 0–200)
HDL: 34.9 mg/dL — ABNORMAL LOW (ref >39.00)
NonHDL: 225.13
Total CHOL/HDL Ratio: 7
Triglycerides: 304 mg/dL — ABNORMAL HIGH (ref 0.0–149.0)
VLDL: 60.8 mg/dL — ABNORMAL HIGH (ref 0.0–40.0)

## 2018-07-25 LAB — LDL CHOLESTEROL, DIRECT: Direct LDL: 192 mg/dL

## 2018-07-25 LAB — VITAMIN D 25 HYDROXY (VIT D DEFICIENCY, FRACTURES): VITD: 25.63 ng/mL — ABNORMAL LOW (ref 30.00–100.00)

## 2018-07-25 LAB — TSH: TSH: 2.65 u[IU]/mL (ref 0.35–4.50)

## 2018-12-13 ENCOUNTER — Other Ambulatory Visit: Payer: Self-pay | Admitting: Obstetrics & Gynecology

## 2018-12-13 DIAGNOSIS — R921 Mammographic calcification found on diagnostic imaging of breast: Secondary | ICD-10-CM

## 2018-12-13 DIAGNOSIS — N6489 Other specified disorders of breast: Secondary | ICD-10-CM

## 2018-12-18 ENCOUNTER — Ambulatory Visit: Admission: RE | Admit: 2018-12-18 | Payer: 59 | Source: Ambulatory Visit

## 2018-12-18 ENCOUNTER — Other Ambulatory Visit: Payer: Self-pay | Admitting: Obstetrics & Gynecology

## 2018-12-18 ENCOUNTER — Ambulatory Visit
Admission: RE | Admit: 2018-12-18 | Discharge: 2018-12-18 | Disposition: A | Discharge status: Home / Self Care | Payer: 59 | Source: Ambulatory Visit | Attending: Obstetrics & Gynecology | Admitting: Obstetrics & Gynecology

## 2018-12-18 DIAGNOSIS — R921 Mammographic calcification found on diagnostic imaging of breast: Secondary | ICD-10-CM

## 2018-12-18 DIAGNOSIS — N6489 Other specified disorders of breast: Secondary | ICD-10-CM

## 2018-12-20 ENCOUNTER — Ambulatory Visit
Admission: RE | Admit: 2018-12-20 | Discharge: 2018-12-20 | Disposition: A | Discharge status: Home / Self Care | Payer: 59 | Source: Ambulatory Visit | Attending: Obstetrics & Gynecology | Admitting: Obstetrics & Gynecology

## 2018-12-20 ENCOUNTER — Other Ambulatory Visit: Payer: Self-pay | Admitting: Obstetrics & Gynecology

## 2018-12-20 DIAGNOSIS — R921 Mammographic calcification found on diagnostic imaging of breast: Secondary | ICD-10-CM

## 2019-01-20 ENCOUNTER — Encounter: Payer: Self-pay | Admitting: Family Medicine

## 2019-01-20 ENCOUNTER — Encounter: Payer: Self-pay | Admitting: Gastroenterology

## 2019-01-20 ENCOUNTER — Ambulatory Visit (INDEPENDENT_AMBULATORY_CARE_PROVIDER_SITE_OTHER): Payer: 59 | Admitting: Family Medicine

## 2019-01-20 VITALS — BP 130/80 | HR 71 | Temp 98.3°F | Ht 65.0 in | Wt 219.1 lb

## 2019-01-20 DIAGNOSIS — H9193 Unspecified hearing loss, bilateral: Secondary | ICD-10-CM

## 2019-01-20 DIAGNOSIS — Z1211 Encounter for screening for malignant neoplasm of colon: Secondary | ICD-10-CM

## 2019-01-20 DIAGNOSIS — R197 Diarrhea, unspecified: Secondary | ICD-10-CM

## 2019-01-20 DIAGNOSIS — Z1159 Encounter for screening for other viral diseases: Secondary | ICD-10-CM

## 2019-01-20 DIAGNOSIS — Z720 Tobacco use: Secondary | ICD-10-CM

## 2019-01-20 DIAGNOSIS — K219 Gastro-esophageal reflux disease without esophagitis: Secondary | ICD-10-CM

## 2019-01-20 DIAGNOSIS — E785 Hyperlipidemia, unspecified: Secondary | ICD-10-CM

## 2019-01-20 DIAGNOSIS — Z114 Encounter for screening for human immunodeficiency virus [HIV]: Secondary | ICD-10-CM

## 2019-01-20 LAB — CBC WITH DIFFERENTIAL/PLATELET
Basophils Absolute: 0.1 10*3/uL (ref 0.0–0.1)
Basophils Relative: 0.8 % (ref 0.0–3.0)
Eosinophils Absolute: 0.2 10*3/uL (ref 0.0–0.7)
Eosinophils Relative: 1.5 % (ref 0.0–5.0)
HCT: 43.9 % (ref 36.0–46.0)
Hemoglobin: 14.9 g/dL (ref 12.0–15.0)
Lymphocytes Relative: 41.9 % (ref 12.0–46.0)
Lymphs Abs: 4.6 10*3/uL — ABNORMAL HIGH (ref 0.7–4.0)
MCHC: 34 g/dL (ref 30.0–36.0)
MCV: 95.7 fl (ref 78.0–100.0)
Monocytes Absolute: 0.5 10*3/uL (ref 0.1–1.0)
Monocytes Relative: 4.7 % (ref 3.0–12.0)
Neutro Abs: 5.6 10*3/uL (ref 1.4–7.7)
Neutrophils Relative %: 51.1 % (ref 43.0–77.0)
Platelets: 227 10*3/uL (ref 150.0–400.0)
RBC: 4.59 Mil/uL (ref 3.87–5.11)
RDW: 13.5 % (ref 11.5–15.5)
WBC: 10.9 10*3/uL — ABNORMAL HIGH (ref 4.0–10.5)

## 2019-01-20 LAB — LIPID PANEL
Cholesterol: 255 mg/dL — ABNORMAL HIGH (ref 0–200)
HDL: 33.6 mg/dL — ABNORMAL LOW (ref >39.00)
NonHDL: 221.02
Total CHOL/HDL Ratio: 8
Triglycerides: 259 mg/dL — ABNORMAL HIGH (ref 0.0–149.0)
VLDL: 51.8 mg/dL — ABNORMAL HIGH (ref 0.0–40.0)

## 2019-01-20 LAB — COMPREHENSIVE METABOLIC PANEL
ALT: 60 U/L — ABNORMAL HIGH (ref 0–35)
AST: 52 U/L — ABNORMAL HIGH (ref 0–37)
Albumin: 4.6 g/dL (ref 3.5–5.2)
Alkaline Phosphatase: 99 U/L (ref 39–117)
BUN: 10 mg/dL (ref 6–23)
CO2: 30 mEq/L (ref 19–32)
Calcium: 9.9 mg/dL (ref 8.4–10.5)
Chloride: 100 mEq/L (ref 96–112)
Creatinine, Ser: 0.73 mg/dL (ref 0.40–1.20)
GFR: 83.1 mL/min (ref >60.00)
Glucose, Bld: 84 mg/dL (ref 70–99)
Potassium: 4.4 mEq/L (ref 3.5–5.1)
Sodium: 139 mEq/L (ref 135–145)
Total Bilirubin: 0.5 mg/dL (ref 0.2–1.2)
Total Protein: 7.4 g/dL (ref 6.0–8.3)

## 2019-01-20 LAB — LDL CHOLESTEROL, DIRECT: Direct LDL: 189 mg/dL

## 2019-01-20 LAB — LIPASE: Lipase: 11 U/L (ref 11.0–59.0)

## 2019-01-20 MED ORDER — VARENICLINE TARTRATE 0.5 MG X 11 & 1 MG X 42 PO MISC: ORAL | 0 refills | Status: DC

## 2019-01-20 NOTE — Progress Notes (Signed)
Lori [name] DOB: [birth year] Encounter date: 01/20/2019  This is a 53 y.o. female who presents to establish care. Chief Complaint  Patient presents with  . Transitions Of Care  . Diarrhea     x 84month, has taken immodium, abdominal pain, upper middle quadrant,    History of present illness: Formerly Dr. Raliegh Ip patient  Followed by pain mgmt for chronic low back pain. Dr. Marin Roberts; [facility].   Care gaps include: colonoscopy, mammogram; mammogram is in system? Recently had breast biopsy; this was benign and released for annual screening.   Had some colonoscopies back when she was having acid reflux issues. Last with Dr. Zena Amos. Then started seeing Dr. Deatra Ina (who has also retired). Then followe diwth Dr. Hassell Done and had [employer] fundoplication which helped her tremendously.   OSA: uses cpap most of time. This year after getting surgery on both hands, limited use for a few months. Wants to schedule f/u with sleep doc. Does feel better when she wears this.  Hiatal Hernia, GERD: epigastric pain more notable in last 4 weeks. Increased gas, pressure. Not sure cause. Took imodium twice one day last week - this didn't stop her up like normal.  Has been having diarrhea for a month. Going at least 3-4 times/day. No blood in stool. Has had one solid stool in last month which was yesterday. No travel. No sick contacts. No uncooked foods.  Gyn did some bloodwork on her. Started replacing vitamin D which is about same time she started with sx. Having  WUJ:WJXB to have adjusted by chiropractor. Sometimes tooth pain; not sure if related to cpap at night.  Arthritis: mostly in lower back. Pain in low back radiating down left leg.   Desires to quit smoking. Has tried wellbutrin in past and it didn't help her at all. Hasn't been able to do it on own.   Past Medical History:  Diagnosis Date  . ALLERGIC RHINITIS 03/14/2010  . BACK PAIN 01/24/2010  . Buttock pain   . GERD 12/01/2008  . HIATAL HERNIA 05/27/2010   . 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 Numbness and tingling of left upper and lower extremity   . OSTEOARTHRITIS 12/01/2008  . Sleep apnea   . Thigh pain   . 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 TOBACCO USE 12/01/2008  . VITAMIN D DEFICIENCY 05/23/2010   Past Surgical History:  Procedure Laterality Date  . ABDOMINAL HYSTERECTOMY N/A 10/17/2016   Procedure: HYSTERECTOMY ABDOMINAL;  Surgeon: Sanjuana Kava, MD;  Location: [facility]; benign mass in uterus  . APPENDECTOMY    . BREAST SURGERY     lumpectomy  . CERVICAL FUSION     x2; 2007, 2011  . CESAREAN SECTION    . COLONOSCOPY    . CYSTOSCOPY N/A 10/17/2016   Procedure: CYSTOSCOPY;  Surgeon: Sanjuana Kava, MD;  Location: [facility];  Service: Gynecology;  Laterality: N/A;  . DILATION AND CURETTAGE OF UTERUS     miscarriage  . ESOPHAGEAL MANOMETRY    . ESOPHAGOGASTRODUODENOSCOPY    . LAPAROSCOPIC NISSEN FUNDOPLICATION    . LUMBAR DISC SURGERY    . LUMBAR LAMINECTOMY  2005  . SALPINGOOPHORECTOMY Bilateral 10/17/2016   Procedure: SALPINGO OOPHORECTOMY;  Surgeon: Sanjuana Kava, MD;  Location: [facility];  Service: Gynecology;  Laterality: Bilateral;  . TONSILLECTOMY AND ADENOIDECTOMY     Allergies  Allergen Reactions  . Amoxicillin Nausea Only    Yeast Infection Has patient had a PCN reaction causing immediate rash, facial/tongue/throat swelling, SOB or lightheadedness  with hypotension: no Has patient had a PCN reaction causing severe rash involving mucus membranes or skin necrosis: no Has patient had a PCN reaction that required hospitalization yes Has patient had a PCN reaction occurring within the last 10 years: yes If all of the above answers are "NO", then may proceed with Cephalosporin use.   . Latex   . Morphine     REACTION: hyper  . Torecan [Thiethylperazine]    Current Meds  Medication Sig  . Biotin w/ Vitamins C & E (HAIR/SKIN/NAILS PO) Take 1 tablet by mouth every morning.  . diazepam (VALIUM) 5 MG tablet TAKE 1 TABLET BY MOUTH EVERY  DAY AT BEDTIME AS NEEDED  . fluticasone (FLONASE) 50 MCG/ACT nasal spray Place 2 sprays into both nostrils daily.  . furosemide (LASIX) 20 MG tablet TAKE 1 TABLET BY MOUTH DAILY  . oxyCODONE-acetaminophen (PERCOCET) 10-325 MG tablet Take 1 tablet by mouth every 6 (six) hours as needed for pain.  Marland Kitchen tolterodine (DETROL) 2 MG tablet Take 2 mg by mouth daily as needed.   . venlafaxine XR (EFFEXOR-XR) 150 MG 24 hr capsule Take 150 mg by mouth at bedtime.   . [DISCONTINUED] Misc Natural Products (OSTEO BI-FLEX ADV JOINT SHIELD) TABS Take 1 tablet by mouth daily.  . [DISCONTINUED] Multiple Vitamin (MULTIVITAMIN) tablet Take 1 tablet by mouth daily.  . [DISCONTINUED] triamcinolone cream (KENALOG) 0.1 % Apply 1 application topically 2 (two) times daily.   Social History   Tobacco Use  . Smoking status: Current Every Day Smoker    Packs/day: 1.00    Years: 31.00    Pack years: 31.00    Types: Cigarettes  . Smokeless tobacco: Never Used  Substance Use Topics  . Alcohol use: Yes    Comment: social   Family History  Problem Relation Age of Onset  . Breast cancer Mother      Review of Systems  Constitutional: Negative for chills, fatigue and fever.  Respiratory: Negative for cough, chest tightness, shortness of breath and wheezing.   Cardiovascular: Negative for chest pain, palpitations and leg swelling.    Objective:  BP 130/80 (BP Location: Left Arm, Patient Position: Sitting, Cuff Size: Normal)   Pulse 71   Temp 98.3 F (36.8 C) (Oral)   Ht 5\' 5"  (1.651 m)   Wt 219 lb 1.6 oz (99.4 kg)   LMP 09/27/2016 (Approximate)   SpO2 98%   BMI 36.46 kg/m   Weight: 219 lb 1.6 oz (99.4 kg)   BP Readings from Last 3 Encounters:  01/20/19 130/80  07/24/18 140/70  02/07/18 138/78   Wt Readings from Last 3 Encounters:  01/20/19 219 lb 1.6 oz (99.4 kg)  07/24/18 226 lb 6.4 oz (102.7 kg)  02/07/18 226 lb 12.8 oz (102.9 kg)    Physical Exam Constitutional:      General: She is not in  acute distress.    Appearance: She is well-developed.  HENT:     Right Ear: Ear canal and external ear normal. No middle ear effusion. There is no impacted cerumen. Tympanic membrane is scarred.     Left Ear: Ear canal and external ear normal.  No middle ear effusion. There is no impacted cerumen. Tympanic membrane is scarred.     Ears:     Comments: Min scarring bilat; there is evidence of chronic ETD. Cardiovascular:     Rate and Rhythm: Normal rate and regular rhythm.     Heart sounds: Normal heart sounds. No murmur. No friction rub.  Pulmonary:     Effort: Pulmonary effort is normal. No respiratory distress.     Breath sounds: Normal breath sounds. No wheezing or rales.  Abdominal:     General: Abdomen is flat. Bowel sounds are normal.     Palpations: Abdomen is soft.     Tenderness: Tenderness: mild epigastric. There is no guarding or rebound. Negative signs include Murphy's sign and McBurney's sign.     Hernia: No hernia is present.  Musculoskeletal:     Right lower leg: No edema.     Left lower leg: No edema.  Neurological:     Mental Status: She is alert and oriented to person, place, and time.  Psychiatric:        Behavior: Behavior normal.     Assessment/Plan: 1. Diarrhea, unspecified type Start with bloodwork; stool cultures pending those results. NO DAIRY advised. - Ambulatory referral to Gastroenterology - TSH; Future - CBC with Differential/Platelet; Future - Comprehensive metabolic panel; Future - Lipase - Comprehensive metabolic panel - CBC with Differential/Platelet - TSH  2. Colon cancer screening  - Ambulatory referral to Gastroenterology  3. Gastroesophageal reflux disease, esophagitis presence not specified  - Ambulatory referral to Gastroenterology  4. Hyperlipidemia, unspecified hyperlipidemia type  - Lipid panel; Future - Lipid panel  5. Screening for HIV (human immunodeficiency virus)  - HIV Antibody (routine testing w rflx); Future -  HIV Antibody (routine testing w rflx)  6. Encounter for hepatitis C screening test for low risk patient  - Hep C Antibody  7. Tobacco abuse  - varenicline (CHANTIX STARTING MONTH PAK) 0.5 MG X 11 & 1 MG X 42 tablet; Take one 0.5 mg tablet by mouth once daily for 3 days, then increase to one 0.5 mg tablet twice daily for 4 days, then increase to one 1 mg tablet twice daily.  Dispense: 53 tablet; Refill: 0  8. Bilateral hearing loss, unspecified hearing loss type Chronic "fluid" in ears; had had TM rupture in past. On and off problems. Recommended regular flonase. Will get ENT opinion.  - Ambulatory referral to ENT   Return pending bloodwork.  Micheline Rough, MD

## 2019-01-20 NOTE — Patient Instructions (Signed)
No more dairy until stools are formed.

## 2019-01-21 LAB — TSH: TSH: 1.2 u[IU]/mL (ref 0.35–4.50)

## 2019-01-21 LAB — HEPATITIS C ANTIBODY
Hepatitis C Ab: NONREACTIVE
SIGNAL TO CUT-OFF: 0.01 (ref <1.00)

## 2019-01-21 LAB — HIV ANTIBODY (ROUTINE TESTING W REFLEX): HIV 1&2 Ab, 4th Generation: NONREACTIVE

## 2019-01-23 ENCOUNTER — Encounter: Payer: Self-pay | Admitting: Family Medicine

## 2019-01-24 ENCOUNTER — Telehealth: Payer: Self-pay

## 2019-01-24 NOTE — Telephone Encounter (Signed)
PA for varenicline (CHANTIX STARTING MONTH PAK) 0.5 MG X 11 & 1 MG X 42 tablet sent to cover my meds.   Key: [id number] - PA Case ID: [id number] - Rx #: [id number]

## 2019-01-27 NOTE — Telephone Encounter (Signed)
PA has been denied. Denial states that Chantix is only covered if the patient has tried and failed generic Zyban.  Please advise.

## 2019-01-27 NOTE — Telephone Encounter (Signed)
E-Appeal has been sent  Key: [id number]

## 2019-01-27 NOTE — Telephone Encounter (Signed)
She did try and fail zyban (wellbutrin). It is listed in my last progress note under HPI.

## 2019-01-28 NOTE — Telephone Encounter (Signed)
PA has been approved

## 2019-01-28 NOTE — Telephone Encounter (Signed)
Patient is aware 

## 2019-02-11 ENCOUNTER — Encounter: Payer: Self-pay | Admitting: Gastroenterology

## 2019-02-11 ENCOUNTER — Other Ambulatory Visit (INDEPENDENT_AMBULATORY_CARE_PROVIDER_SITE_OTHER): Payer: 59

## 2019-02-11 ENCOUNTER — Ambulatory Visit (INDEPENDENT_AMBULATORY_CARE_PROVIDER_SITE_OTHER): Payer: 59 | Admitting: Gastroenterology

## 2019-02-11 VITALS — BP 144/86 | HR 81 | Ht 65.0 in | Wt 225.6 lb

## 2019-02-11 DIAGNOSIS — R194 Change in bowel habit: Secondary | ICD-10-CM | POA: Diagnosis not present

## 2019-02-11 DIAGNOSIS — R197 Diarrhea, unspecified: Secondary | ICD-10-CM | POA: Diagnosis not present

## 2019-02-11 DIAGNOSIS — K219 Gastro-esophageal reflux disease without esophagitis: Secondary | ICD-10-CM | POA: Diagnosis not present

## 2019-02-11 LAB — HIGH SENSITIVITY CRP: CRP, High Sensitivity: 11.63 mg/L — ABNORMAL HIGH (ref 0.000–5.000)

## 2019-02-11 LAB — SEDIMENTATION RATE: Sed Rate: 11 mm/hr (ref 0–30)

## 2019-02-11 MED ORDER — NA SULFATE-K SULFATE-MG SULF 17.5-3.13-1.6 GM/177ML PO SOLN: ORAL | 0 refills | Status: DC

## 2019-02-11 NOTE — Patient Instructions (Addendum)
Go to the basement today for labs  We will obtain your records from Dr Earlean Shawl  You have been scheduled for an endoscopy and colonoscopy. Please follow the written instructions given to you at your visit today. Please pick up your prep supplies at the pharmacy within the next 1-3 days. If you use inhalers (even only as needed), please bring them with you on the day of your procedure.  Trail of daily psyllium for stool bulking  If you are age 54 or older, your body mass index should be between 23-30. Your Body mass index is 37.54 kg/m. If this is out of the aforementioned range listed, please consider follow up with your Primary Care Provider.  If you are age 79 or younger, your body mass index should be between 19-25. Your Body mass index is 37.54 kg/m. If this is out of the aformentioned range listed, please consider follow up with your Primary Care Provider.

## 2019-02-11 NOTE — Progress Notes (Signed)
Referring Provider: Caren Macadam, MD Primary Care Physician:  Caren Macadam, MD   Reason for Consultation:  Diarrhea   IMPRESSION:  Recurrent GERD following fundoplication 5284 Diarrhea/ Change in bowel habits Distant colonoscopy (results not available in EPIC)  Recurrent GERD with breakthrough symptoms despite medical therapy.   The differential diagnosis of chronic diarrhea without alarm features includes: irritable bowel syndrome, IBD, celiac disease, missed infection (such as giardia), food intolerance,microscopic colitis, thyroid disorder, other functional GI disease.    PLAN: TSH, ESR, CRP, fecal calprotectin Giardia, Stool for GI pathogens Trial of daily psyllium for stool bulking EGD with duodenal biopsies and Colonoscopy with random biopsies Obtain records from Dr. Allyn Kenner (colonoscopy and office visits)  I consented the patient at the bedside today discussing the risks, benefits, and alternatives to endoscopic evaluation. In particular, we discussed the risks that include, but are not limited to, reaction to medication, cardiopulmonary compromise, bleeding requiring blood transfusion, aspiration resulting in pneumonia, perforation requiring surgery, lack of diagnosis, severe illness requiring hospitalization, and even death. We reviewed the risk of missed lesion including polyps or even cancer. The patient acknowledges these risks and asks that we proceed.   HPI: Lori [name] is a 53 y.o. works at [employer] seen in consultation at the request of Dr. Idelle Jo for further evaluation of diarrhea.  History is obtained to the patient and review of her electronic health record. She has chronic low back pain, obstructive sleep apnea, TMJ, tobacco habituation, vitamin D deficiency.  Recently diagnosed with vitamin D. Diarrhea developed once she started taking Vitamin D.  Having between 4 and 6 bowel movements daily.  No blood or mucus. Associated  non-radiating epigastric abdominal pain, nausea and bloating.  Wanted to vomit but she was unable.  Associated night sweats. Continued for 1 month. Stopped the vitamin D. Reduced dairy.  These changes she is having 3-4 bowel movements daily. Recent unintentional weight gain. Uses Imodium PRN.   Baseline is a bowel movement every 3 to 4 days.  For the last two months she is having more frequency bowel movements with urgency.   He has a longstanding history of reflux.  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 Treated with Prilosec for 10 years. Then Zegerid. Felt like thumb at sternal notch after surgery. ENT formed laryngoscopy said reflux. She then saw Dr. Deatra Ina.  A Nissen fundoplication resulted in significant improvement.  However, continues to have chronic hoarseness and coughing, especially if she discontinues her medicines.  No dysphasia or odynophagia.  Fundoplication and hernia repair 2011.  EGD 07/26/10 for GERD: duodenal bulb erosions. Otherwise normal exam. Bravo pH monitoring and manometry were performed.  Distant colonoscopy, although I am unable to locate these results in EPIC.   Past Medical History:  Diagnosis Date  . ALLERGIC RHINITIS 03/14/2010  . BACK PAIN 01/24/2010  . Buttock pain   . GERD 12/01/2008  . HIATAL HERNIA 05/27/2010  . 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 Numbness and tingling of left upper and lower extremity   . OSTEOARTHRITIS 12/01/2008  . Sleep apnea   . Thigh pain   . 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 TOBACCO USE 12/01/2008  . VITAMIN D DEFICIENCY 05/23/2010    Past Surgical History:  Procedure Laterality Date  . ABDOMINAL HYSTERECTOMY N/A 10/17/2016   Procedure: HYSTERECTOMY ABDOMINAL;  Surgeon: Sanjuana Kava, MD;  Location: [facility]; benign mass in uterus  . APPENDECTOMY    . BREAST SURGERY  lumpectomy  . CERVICAL FUSION     x2; 2007, 2011  . CESAREAN SECTION  1988  . COLONOSCOPY    . CYSTOSCOPY N/A 10/17/2016   Procedure:  CYSTOSCOPY;  Surgeon: Sanjuana Kava, MD;  Location: [facility];  Service: Gynecology;  Laterality: N/A;  . DILATION AND CURETTAGE OF UTERUS     miscarriage  . ESOPHAGEAL MANOMETRY    . ESOPHAGOGASTRODUODENOSCOPY    . LAPAROSCOPIC NISSEN FUNDOPLICATION    . LUMBAR DISC SURGERY    . LUMBAR LAMINECTOMY  2005  . SALPINGOOPHORECTOMY Bilateral 10/17/2016   Procedure: SALPINGO OOPHORECTOMY;  Surgeon: Sanjuana Kava, MD;  Location: [facility];  Service: Gynecology;  Laterality: Bilateral;  . TONSILLECTOMY AND ADENOIDECTOMY      Current Outpatient Medications  Medication Sig Dispense Refill  . Biotin w/ Vitamins C & E (HAIR/SKIN/NAILS PO) Take 1 tablet by mouth every morning.    . fluticasone (FLONASE) 50 MCG/ACT nasal spray Place 2 sprays into both nostrils daily.    Marland Kitchen oxyCODONE-acetaminophen (PERCOCET) 10-325 MG tablet Take 1 tablet by mouth every 6 (six) hours as needed for pain.    Marland Kitchen tolterodine (DETROL) 2 MG tablet Take 2 mg by mouth daily as needed.     . venlafaxine XR (EFFEXOR-XR) 150 MG 24 hr capsule Take 150 mg by mouth at bedtime.     . diazepam (VALIUM) 5 MG tablet TAKE 1 TABLET BY MOUTH EVERY DAY AT BEDTIME AS NEEDED (Patient not taking: Reported on 02/11/2019) 90 tablet 0  . Na Sulfate-K Sulfate-Mg Sulf 17.5-3.13-1.6 GM/177ML SOLN 1 suprep kit for colon prep 354 mL 0  . varenicline (CHANTIX STARTING MONTH PAK) 0.5 MG X 11 & 1 MG X 42 tablet Take one 0.5 mg tablet by mouth once daily for 3 days, then increase to one 0.5 mg tablet twice daily for 4 days, then increase to one 1 mg tablet twice daily. (Patient not taking: Reported on 02/11/2019) 53 tablet 0   No current facility-administered medications for this visit.     Allergies as of 02/11/2019 - Review Complete 02/11/2019  Allergen Reaction Noted  . Amoxicillin Nausea Only 10/14/2014  . Latex  07/24/2018  . Morphine  07/21/2010  . Torecan [thiethylperazine]  09/11/2016    Family History  Problem Relation Age of Onset  . Breast cancer Mother     . Colon polyps Half-Sister   . Colonic polyp Half-Brother   . Esophageal cancer Neg Hx   . Colon cancer Neg Hx     Social History   Socioeconomic History  . Marital status: Legally Separated    Spouse name: Not on file  . Number of children: 1  . Years of education: Not on file  . Highest education level: Not on file  Occupational History  . Occupation: [employer] user  Social Needs  . Financial resource strain: Not on file  . Food insecurity:    Worry: Not on file    Inability: Not on file  . Transportation needs:    Medical: Not on file    Non-medical: Not on file  Tobacco Use  . Smoking status: Current Every Day Smoker    Packs/day: 1.00    Years: 31.00    Pack years: 31.00    Types: Cigarettes  . Smokeless tobacco: Never Used  Substance and Sexual Activity  . Alcohol use: Yes    Comment: social  . Drug use: No  . Sexual activity: Not on file  Lifestyle  . Physical activity:  Days per week: Not on file    Minutes per session: Not on file  . Stress: Not on file  Relationships  . Social connections:    Talks on phone: Not on file    Gets together: Not on file    Attends religious service: Not on file    Active member of club or organization: Not on file    Attends meetings of clubs or organizations: Not on file    Relationship status: Not on file  . Intimate partner violence:    Fear of current or ex partner: Not on file    Emotionally abused: Not on file    Physically abused: Not on file    Forced sexual activity: Not on file  Other Topics Concern  . Not on file  Social History Narrative  . Not on file    Review of Systems: 12 system ROS is negative except as noted above additions of allergies, arthritis, back pain, fatigue, headaches, hearing problems, night sweats, insomnia, sore throat.  Filed Weights   02/11/19 1601  Weight: 225 lb 9 oz (102.3 kg)    Physical Exam: Vital signs were reviewed. General:   Alert, well-nourished, pleasant and  cooperative in NAD Head:  Normocephalic and atraumatic. Eyes:  Sclera clear, no icterus.   Conjunctiva pink. Mouth:  No deformity or lesions.   Neck:  Supple; no thyromegaly. Lungs:  Clear throughout to auscultation.   No wheezes. Heart:  Regular rate and rhythm; no murmurs Abdomen:  Soft, nontender, normal bowel sounds. No rebound or guarding. No hepatosplenomegaly Rectal:  Deferred  Msk:  Symmetrical without gross deformities. Extremities:  No gross deformities or edema. Neurologic:  Alert and  oriented x4;  grossly nonfocal Skin:  No rash or bruise. Psych:  Alert and cooperative. Normal mood and affect.   Kimberly L. Tarri Glenn, MD, MPH [facility] 02/14/2019, 9:51 PM

## 2019-02-12 ENCOUNTER — Other Ambulatory Visit: Payer: Self-pay | Admitting: Emergency Medicine

## 2019-02-12 ENCOUNTER — Other Ambulatory Visit: Payer: 59

## 2019-02-12 DIAGNOSIS — R197 Diarrhea, unspecified: Secondary | ICD-10-CM

## 2019-02-12 DIAGNOSIS — R194 Change in bowel habit: Secondary | ICD-10-CM

## 2019-02-12 DIAGNOSIS — K219 Gastro-esophageal reflux disease without esophagitis: Secondary | ICD-10-CM

## 2019-02-12 LAB — TSH: TSH: 2.21 u[IU]/mL (ref 0.35–4.50)

## 2019-02-12 NOTE — Telephone Encounter (Signed)
Please review previous message and advise 

## 2019-02-14 ENCOUNTER — Encounter: Payer: Self-pay | Admitting: Gastroenterology

## 2019-02-14 LAB — CALPROTECTIN, FECAL: Calprotectin, Fecal: 26 ug/g (ref 0–120)

## 2019-02-17 LAB — GIARDIA ANTIGEN
MICRO NUMBER:: 276031
RESULT:: NOT DETECTED
SPECIMEN QUALITY:: ADEQUATE

## 2019-02-17 LAB — GASTROINTESTINAL PATHOGEN PANEL PCR
C. difficile Tox A/B, PCR: NOT DETECTED
Campylobacter, PCR: NOT DETECTED
Cryptosporidium, PCR: NOT DETECTED
E coli (ETEC) LT/ST PCR: NOT DETECTED
E coli (STEC) stx1/stx2, PCR: NOT DETECTED
E coli 0157, PCR: NOT DETECTED
Giardia lamblia, PCR: NOT DETECTED
Norovirus, PCR: NOT DETECTED
Rotavirus A, PCR: NOT DETECTED
Salmonella, PCR: NOT DETECTED
Shigella, PCR: NOT DETECTED

## 2019-02-27 ENCOUNTER — Other Ambulatory Visit: Payer: Self-pay | Admitting: Family Medicine

## 2019-02-27 DIAGNOSIS — Z720 Tobacco use: Secondary | ICD-10-CM

## 2019-03-24 ENCOUNTER — Encounter: Payer: 59 | Admitting: Gastroenterology

## 2019-03-24 ENCOUNTER — Other Ambulatory Visit: Payer: Self-pay | Admitting: Family Medicine

## 2019-03-24 DIAGNOSIS — Z720 Tobacco use: Secondary | ICD-10-CM

## 2019-03-26 ENCOUNTER — Other Ambulatory Visit: Payer: Self-pay | Admitting: Family Medicine

## 2019-03-26 ENCOUNTER — Encounter: Payer: Self-pay | Admitting: Family Medicine

## 2019-03-26 MED ORDER — DIAZEPAM 5 MG PO TABS: ORAL_TABLET | ORAL | 0 refills | Status: DC

## 2019-03-26 NOTE — Telephone Encounter (Signed)
Ok to refill 

## 2019-04-14 ENCOUNTER — Telehealth: Payer: Self-pay | Admitting: *Deleted

## 2019-04-14 NOTE — Telephone Encounter (Signed)
Per Dr. Tarri Glenn, ok to schedule ECL at this time.  Procedure scheduled for 04-22-19 at 8:30 a.m. Pt has Suprep at home.  New instructions sent via MyChart. Pt states she has Suprep at home.

## 2019-04-21 ENCOUNTER — Telehealth: Payer: Self-pay | Admitting: Gastroenterology

## 2019-04-21 ENCOUNTER — Telehealth: Payer: Self-pay | Admitting: *Deleted

## 2019-04-21 NOTE — Telephone Encounter (Signed)
Patient sent my Chart message with instructions again.  She confirmed she did get it this time.  All questions answered.

## 2019-04-21 NOTE — Telephone Encounter (Signed)
Covid-19 travel screening questions  Have you traveled in the last 14 days? No If yes where?  Do you now or have you had a fever in the last 14 days? No  Do you have any respiratory symptoms of shortness of breath or cough now or in the last 14 days? No  Do you have any family members or close contacts with diagnosed or suspected Covid-19? No       

## 2019-04-22 ENCOUNTER — Ambulatory Visit (AMBULATORY_SURGERY_CENTER): Payer: 59 | Admitting: Gastroenterology

## 2019-04-22 ENCOUNTER — Other Ambulatory Visit: Payer: Self-pay | Admitting: Emergency Medicine

## 2019-04-22 ENCOUNTER — Encounter: Payer: Self-pay | Admitting: Gastroenterology

## 2019-04-22 ENCOUNTER — Other Ambulatory Visit: Payer: Self-pay

## 2019-04-22 VITALS — BP 133/74 | HR 73 | Temp 98.9°F | Resp 17 | Ht 65.0 in | Wt 225.0 lb

## 2019-04-22 DIAGNOSIS — K200 Eosinophilic esophagitis: Secondary | ICD-10-CM | POA: Diagnosis not present

## 2019-04-22 DIAGNOSIS — R194 Change in bowel habit: Secondary | ICD-10-CM

## 2019-04-22 DIAGNOSIS — K2970 Gastritis, unspecified, without bleeding: Secondary | ICD-10-CM

## 2019-04-22 DIAGNOSIS — R197 Diarrhea, unspecified: Secondary | ICD-10-CM

## 2019-04-22 DIAGNOSIS — K219 Gastro-esophageal reflux disease without esophagitis: Secondary | ICD-10-CM

## 2019-04-22 DIAGNOSIS — K629 Disease of anus and rectum, unspecified: Secondary | ICD-10-CM

## 2019-04-22 DIAGNOSIS — K209 Esophagitis, unspecified without bleeding: Secondary | ICD-10-CM

## 2019-04-22 MED ORDER — SODIUM CHLORIDE 0.9 % IV SOLN: 500.0000 mL | Freq: Once | INTRAVENOUS | Status: DC

## 2019-04-22 NOTE — Patient Instructions (Signed)
YOU HAD AN ENDOSCOPIC PROCEDURE TODAY AT [facility]:   Refer to the procedure report that was given to you for any specific questions about what was found during the examination.  If the procedure report does not answer your questions, please call your gastroenterologist to clarify.  If you requested that your care partner not be given the details of your procedure findings, then the procedure report has been included in a sealed envelope for you to review at your convenience later.  YOU SHOULD EXPECT: Some feelings of bloating in the abdomen. Passage of more gas than usual.  Walking can help get rid of the air that was put into your GI tract during the procedure and reduce the bloating. If you had a lower endoscopy (such as a colonoscopy or flexible sigmoidoscopy) you may notice spotting of blood in your stool or on the toilet paper. If you underwent a bowel prep for your procedure, you may not have a normal bowel movement for a few days.  Please Note:  You might notice some irritation and congestion in your nose or some drainage.  This is from the oxygen used during your procedure.  There is no need for concern and it should clear up in a day or so.  SYMPTOMS TO REPORT IMMEDIATELY:   Following lower endoscopy (colonoscopy or flexible sigmoidoscopy):  Excessive amounts of blood in the stool  Significant tenderness or worsening of abdominal pains  Swelling of the abdomen that is new, acute  Fever of 100F or higher   Following upper endoscopy (EGD)  Vomiting of blood or coffee ground material  New chest pain or pain under the shoulder blades  Painful or persistently difficult swallowing  New shortness of breath  Fever of 100F or higher  Black, tarry-looking stools  For urgent or emergent issues, a gastroenterologist can be reached at any hour by calling [phone].   DIET:  We do recommend a small meal at first, but then you may proceed to your regular diet.  Drink  plenty of fluids but you should avoid alcoholic beverages for 24 hours.  ACTIVITY:  You should plan to take it easy for the rest of today and you should NOT DRIVE or use heavy machinery until tomorrow (because of the sedation medicines used during the test).    FOLLOW UP: Our staff will call the number listed on your records the next business day following your procedure to check on you and address any questions or concerns that you may have regarding the information given to you following your procedure. If we do not reach you, we will leave a message.  However, if you are feeling well and you are not experiencing any problems, there is no need to return our call.  We will assume that you have returned to your regular daily activities without incident. We will be calling you two weeks following your procedure to see if you have developed any symptoms of the COVID-19.  If you develop any symptoms before then, please let us know.  If any biopsies were taken you will be contacted by phone or by letter within the next 1-3 weeks.  Please call us at [phone] if you have not heard about the biopsies in 3 weeks.    SIGNATURES/CONFIDENTIALITY: You and/or your care partner have signed paperwork which will be entered into your electronic medical record.  These signatures attest to the fact that that the information above on your After Visit Summary has  been reviewed and is understood.  Full responsibility of the confidentiality of this discharge information lies with you and/or your care-partner.   Dr. Payton Emerald office nurse will call to set up referral to surgeon regarding perineal lesion and also to set up upper GI series.  They will set up an office appointment after that is completed.  Await pathology  Continue your normal medications- she didn't change any of your medications today.  Please read over handout about esophagitis.

## 2019-04-22 NOTE — Progress Notes (Signed)
Called to room to assist during endoscopic procedure.  Patient ID and intended procedure confirmed with present staff. Received instructions for my participation in the procedure from the performing physician.  

## 2019-04-22 NOTE — Op Note (Signed)
[place] Patient Name: Lori [name] Procedure Date: 04/22/2019 8:36 AM MRN: [id number] Endoscopist: Thornton Park MD, MD Age: 53 Referring MD:  Date of Birth: [birth year] Gender: Female Account #: [id number] Procedure:                Colonoscopy Indications:              Chronic diarrhea of unclear etiology. No known                            family history of colon cancer. Half sister and                            half brother with colon polyps. Prior colonoscopy                            has been performed (details not available) Medicines:                See the Anesthesia note for documentation of the                            administered medications Procedure:                Pre-Anesthesia Assessment:                           - Prior to the procedure, a History and Physical                            was performed, and patient medications and                            allergies were reviewed. The patient's tolerance of                            previous anesthesia was also reviewed. The risks                            and benefits of the procedure and the sedation                            options and risks were discussed with the patient.                            All questions were answered, and informed consent                            was obtained. Prior Anticoagulants: The patient has                            taken no previous anticoagulant or antiplatelet                            agents. ASA Grade Assessment: II - A patient with  mild systemic disease. After reviewing the risks                            and benefits, the patient was deemed in                            satisfactory condition to undergo the procedure.                           After obtaining informed consent, the colonoscope                            was passed under direct vision. Throughout the                            procedure, the patient's  blood pressure, pulse, and                            oxygen saturations were monitored continuously. The                            Colonoscope was introduced through the anus and                            advanced to the the terminal ileum, with                            identification of the appendiceal orifice and IC                            valve. The colonoscopy was performed without                            difficulty. The patient tolerated the procedure                            well. The quality of the bowel preparation was                            good. The terminal ileum, ileocecal valve,                            appendiceal orifice, and rectum were photographed. Scope In: 8:54:21 AM Scope Out: 9:08:19 AM Scope Withdrawal Time: 0 hours 9 minutes 12 seconds  Total Procedure Duration: 0 hours 13 minutes 58 seconds  Findings:                 The perianal exam was abnormal. There is a small,                            firm, pearly, multilobed lesion located over one                            1cm from the dentate line. This is of  unclear                            clinical significance.                           The colon (entire examined portion) appeared                            normal. Biopsies for histology were taken with a                            cold forceps from the entire colon for evaluation                            of microscopic colitis. Estimated blood loss was                            minimal.                           The exam was otherwise without abnormality on                            direct and retroflexion views. Complications:            No immediate complications. Estimated blood loss:                            Minimal. Estimated Blood Loss:     Estimated blood loss was minimal. Impression:               - Abnormal perianal exam.                           - The entire examined colon is normal. Biopsied.                           - The  examination was otherwise normal on direct                            and retroflexion views. Recommendation:           - Patient has a contact number available for                            emergencies. The signs and symptoms of potential                            delayed complications were discussed with the                            patient. Return to normal activities tomorrow.                            Written discharge instructions were provided to the  patient.                           - Resume regular diet today.                           - Continue all medications. I did not make any                            changes in your medications today.                           - Await pathology results.                           - Referral to surgeon to evaluate the perianal                            lesion                           - Repeat colonoscopy in 10 years, or earlier as                            needed. Thornton Park MD, MD 04/22/2019 9:26:31 AM This report has been signed electronically.

## 2019-04-22 NOTE — Progress Notes (Signed)
PT taken to PACU. Monitors in place. VSS. Report given to RN. 

## 2019-04-22 NOTE — Op Note (Signed)
[place] Patient Name: Lori [name] Procedure Date: 04/22/2019 8:36 AM MRN: [id number] Endoscopist: Thornton Park MD, MD Age: 53 Referring MD:  Date of Birth: 01-Oct-1966 Gender: Female Account #: [id number] Procedure:                Upper GI endoscopy Indications:              Heartburn Medicines:                See the Anesthesia note for documentation of the                            administered medications Procedure:                Pre-Anesthesia Assessment:                           - Prior to the procedure, a History and Physical                            was performed, and patient medications and                            allergies were reviewed. The patient's tolerance of                            previous anesthesia was also reviewed. The risks                            and benefits of the procedure and the sedation                            options and risks were discussed with the patient.                            All questions were answered, and informed consent                            was obtained. Prior Anticoagulants: The patient has                            taken no previous anticoagulant or antiplatelet                            agents. ASA Grade Assessment: II - A patient with                            mild systemic disease. After reviewing the risks                            and benefits, the patient was deemed in                            satisfactory condition to undergo the procedure.                           -  Prior to the procedure, a History and Physical                            was performed, and patient medications and                            allergies were reviewed. The patient's tolerance of                            previous anesthesia was also reviewed. The risks                            and benefits of the procedure and the sedation                            options and risks were discussed with the patient.                          All questions were answered, and informed consent                            was obtained. Prior Anticoagulants: The patient has                            taken no previous anticoagulant or antiplatelet                            agents. ASA Grade Assessment: II - A patient with                            mild systemic disease. After reviewing the risks                            and benefits, the patient was deemed in                            satisfactory condition to undergo the procedure.                           After obtaining informed consent, the endoscope was                            passed under direct vision. Throughout the                            procedure, the patient's blood pressure, pulse, and                            oxygen saturations were monitored continuously. The                            Model [id number]) scope was introduced  through the mouth, and advanced to the third part                            of duodenum. The upper GI endoscopy was                            accomplished without difficulty. The patient                            tolerated the procedure well. Scope In: Scope Out: Findings:                 LA Grade A (one or more mucosal breaks less than 5                            mm, not extending between tops of 2 mucosal folds)                            esophagitis with no bleeding was found. Biopsies                            were obtained from the proximal and distal                            esophagus with cold forceps for histology of                            suspected eosinophilic esophagitis.                           The fundoplication wrap is loose. Diffuse mildly                            erythematous mucosa without bleeding was found in                            the gastric antrum. Biopsies were taken with a cold                            forceps for Helicobacter  pylori testing. Estimated                            blood loss was minimal.                           The examined duodenum was normal. Biopsies were                            taken with a cold forceps for histology. Estimated                            blood loss was minimal.                           The  exam was otherwise without abnormality. Complications:            No immediate complications. Estimated blood loss:                            Minimal. Estimated Blood Loss:     Estimated blood loss was minimal. Impression:               - LA Grade A reflux esophagitis. Biopsied.                           - Loose fundoplication wrap.                           - Erythematous mucosa in the antrum. Biopsied.                           - Normal examined duodenum. Biopsied.                           - The examination was otherwise normal. Recommendation:           - Patient has a contact number available for                            emergencies. The signs and symptoms of potential                            delayed complications were discussed with the                            patient. Return to normal activities tomorrow.                            Written discharge instructions were provided to the                            patient.                           - Resume regular diet today.                           - Continue present medications.                           - Await pathology results.                           - UGI series.                           - Appointment with me after the UGI series. Thornton Park MD, MD 04/22/2019 9:19:00 AM This report has been signed electronically.

## 2019-04-24 ENCOUNTER — Telehealth: Payer: Self-pay | Admitting: *Deleted

## 2019-04-24 NOTE — Telephone Encounter (Signed)
  Follow up Call-  Call back number 04/22/2019  Post procedure Call Back phone  # [phone]  Permission to leave phone message Yes  Some recent data might be hidden     Patient questions:  Do you have a fever, pain , or abdominal swelling? No. Pain Score  0 *  Have you tolerated food without any problems? Yes.    Have you been able to return to your normal activities? Yes.    Do you have any questions about your discharge instructions: Diet   No. Medications  No. Follow up visit  No.  Do you have questions or concerns about your Care? No.  Actions: * If pain score is 4 or above: No action needed, pain <4.   1. Have you developed a fever since your procedure? no  2.   Have you had an respiratory symptoms (SOB or cough) since your procedure? no  3.   Have you tested positive for COVID 19 since your procedure no  3.   Have you had any family members/close contacts diagnosed with the COVID 19 since your procedure?  no   If any of these questions are a yes, please inquire if patient has been seen by family doctor and route this note to Joylene John, Therapist, sports.

## 2019-04-29 ENCOUNTER — Encounter: Payer: Self-pay | Admitting: Gastroenterology

## 2019-05-07 ENCOUNTER — Encounter: Payer: Self-pay | Admitting: Family Medicine

## 2019-05-07 NOTE — Telephone Encounter (Signed)
I called the pt and offered an office visit to come in today at 3pm.  Patient declined due to having to give a 24-hour notice with her job and an appt was scheduled for tomorrow at 3:30pm.

## 2019-05-07 NOTE — Telephone Encounter (Signed)
Ok to schedule with provider that has availability or if she can wait I can see in office Friday (see sooner if tender, hot, painful)

## 2019-05-08 ENCOUNTER — Other Ambulatory Visit: Payer: Self-pay

## 2019-05-08 ENCOUNTER — Ambulatory Visit (INDEPENDENT_AMBULATORY_CARE_PROVIDER_SITE_OTHER): Payer: 59 | Admitting: Internal Medicine

## 2019-05-08 ENCOUNTER — Encounter: Payer: Self-pay | Admitting: Internal Medicine

## 2019-05-08 VITALS — BP 120/80 | HR 101 | Temp 98.1°F | Wt 224.5 lb

## 2019-05-08 DIAGNOSIS — F419 Anxiety disorder, unspecified: Secondary | ICD-10-CM | POA: Diagnosis not present

## 2019-05-08 DIAGNOSIS — S301XXA Contusion of abdominal wall, initial encounter: Secondary | ICD-10-CM

## 2019-05-08 DIAGNOSIS — T148XXA Other injury of unspecified body region, initial encounter: Secondary | ICD-10-CM

## 2019-05-08 NOTE — Progress Notes (Signed)
Established Patient Office Visit     CC/Reason for Visit: Knot in left groin crease  HPI: Lori [name] is a 53 y.o. female who is coming in today for the above mentioned reasons. She had an EGD/colonoscopy 2-3 weeks ago. Past few days noticed increased left groin pain when climbing stairs. Felt a knot and decided to come in today to have it evaluated. With staying at home due to the COVID-19 pandemic, she has noticed increased anxiety, tearfulness and difficulty sleeping.   Past Medical/Surgical History: Past Medical History:  Diagnosis Date  . ALLERGIC RHINITIS 03/14/2010  . BACK PAIN 01/24/2010  . Buttock pain   . GERD 12/01/2008  . HIATAL HERNIA 05/27/2010  . 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 Numbness and tingling of left upper and lower extremity   . OSTEOARTHRITIS 12/01/2008  . Sleep apnea   . Thigh pain   . 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 TOBACCO USE 12/01/2008  . VITAMIN D DEFICIENCY 05/23/2010    Past Surgical History:  Procedure Laterality Date  . ABDOMINAL HYSTERECTOMY N/A 10/17/2016   Procedure: HYSTERECTOMY ABDOMINAL;  Surgeon: Sanjuana Kava, MD;  Location: [facility]; benign mass in uterus  . APPENDECTOMY    . BREAST SURGERY     lumpectomy  . CERVICAL FUSION     x2; 2007, 2011  . CESAREAN SECTION  1988  . COLONOSCOPY    . CYSTOSCOPY N/A 10/17/2016   Procedure: CYSTOSCOPY;  Surgeon: Sanjuana Kava, MD;  Location: [facility];  Service: Gynecology;  Laterality: N/A;  . DILATION AND CURETTAGE OF UTERUS     miscarriage  . ESOPHAGEAL MANOMETRY    . ESOPHAGOGASTRODUODENOSCOPY    . GASTRIC FUNDOPLICATION     [facility]  . LAPAROSCOPIC NISSEN FUNDOPLICATION    . LUMBAR DISC SURGERY    . LUMBAR LAMINECTOMY  2005  . SALPINGOOPHORECTOMY Bilateral 10/17/2016   Procedure: SALPINGO OOPHORECTOMY;  Surgeon: Sanjuana Kava, MD;  Location: [facility];  Service: Gynecology;  Laterality: Bilateral;  . TONSILLECTOMY AND ADENOIDECTOMY    . UPPER GASTROINTESTINAL ENDOSCOPY      Social History:   reports that she has been smoking cigarettes. She has a 31.00 pack-year smoking history. She has never used smokeless tobacco. She reports current alcohol use. She reports that she does not use drugs.  Allergies: Allergies  Allergen Reactions  . Amoxicillin Nausea Only    Yeast Infection Has patient had a PCN reaction causing immediate rash, facial/tongue/throat swelling, SOB or lightheadedness with hypotension: no Has patient had a PCN reaction causing severe rash involving mucus membranes or skin necrosis: no Has patient had a PCN reaction that required hospitalization yes Has patient had a PCN reaction occurring within the last 10 years: yes If all of the above answers are "NO", then may proceed with Cephalosporin use.  Yeast Infection Has patient had a PCN reaction causing immediate rash, facial/tongue/throat swelling, SOB or lightheadedness with hypotension: no Has patient had a PCN reaction causing severe rash involving mucus membranes or skin necrosis: no Has patient had a PCN reaction that required hospitalization yes Has patient had a PCN reaction occurring within the last 10 years: yes If all of the above answers are "NO", then may proceed with Cephalosporin use.  Marland Kitchen Morphine Other (See Comments)    REACTION: hyper REACTION: hyper  . Thiethylperazine Other (See Comments)  . Latex Rash    Family History:  Family History  Problem Relation Age of Onset  . Breast cancer Mother   .  Colon polyps Half-Sister   . Colonic polyp Half-Brother   . Esophageal cancer Neg Hx   . Colon cancer Neg Hx   . Rectal cancer Neg Hx   . Stomach cancer Neg Hx      Current Outpatient Medications:  .  Biotin w/ Vitamins C & E (HAIR/SKIN/NAILS PO), Take 1 tablet by mouth every morning., Disp: , Rfl:  .  cetirizine (ZYRTEC) 10 MG tablet, Take 10 mg by mouth at bedtime., Disp: , Rfl:  .  diazepam (VALIUM) 5 MG tablet, TAKE 1 TABLET BY MOUTH EVERY DAY AT BEDTIME AS NEEDED, Disp: 30 tablet, Rfl:  0 .  fluticasone (FLONASE) 50 MCG/ACT nasal spray, Place 2 sprays into both nostrils daily., Disp: , Rfl:  .  oxyCODONE-acetaminophen (PERCOCET) 10-325 MG tablet, Take 1 tablet by mouth every 6 (six) hours as needed for pain., Disp: , Rfl:  .  tolterodine (DETROL) 2 MG tablet, Take 2 mg by mouth daily as needed. , Disp: , Rfl:  .  varenicline (CHANTIX STARTING MONTH PAK) 0.5 MG X 11 & 1 MG X 42 tablet, TAKE AS DIRECTED ON PACKAGE., Disp: 53 tablet, Rfl: 0 .  venlafaxine XR (EFFEXOR-XR) 150 MG 24 hr capsule, Take 150 mg by mouth at bedtime. , Disp: , Rfl:   Current Facility-Administered Medications:  .  0.9 %  sodium chloride infusion, 500 mL, Intravenous, Once, Thornton Park, MD  Review of Systems:  Constitutional: Denies fever, chills, diaphoresis, appetite change and fatigue.  HEENT: Denies photophobia, eye pain, redness, hearing loss, ear pain, congestion, sore throat, rhinorrhea, sneezing, mouth sores, trouble swallowing, neck pain, neck stiffness and tinnitus.   Respiratory: Denies SOB, DOE, cough, chest tightness,  and wheezing.   Cardiovascular: Denies chest pain, palpitations and leg swelling.  Gastrointestinal: Denies nausea, vomiting, abdominal pain, diarrhea, constipation, blood in stool and abdominal distention.  Genitourinary: Denies dysuria, urgency, frequency, hematuria, flank pain and difficulty urinating.  Endocrine: Denies: hot or cold intolerance, sweats, changes in hair or nails, polyuria, polydipsia. Musculoskeletal: Denies myalgias, back pain, joint swelling, arthralgias and gait problem.  Skin: Denies pallor, rash and wound.  Neurological: Denies dizziness, seizures, syncope, weakness, light-headedness, numbness and headaches.  Hematological: Denies adenopathy. Easy bruising, personal or family bleeding history  Psychiatric/Behavioral: Denies suicidal ideation,  Confusion and agitation    Physical Exam: Vitals:   05/08/19 1522  BP: 120/80  Pulse: (!) 101   Temp: 98.1 F (36.7 C)  TempSrc: Oral  SpO2: 94%  Weight: 224 lb 8 oz (101.8 kg)    Body mass index is 37.36 kg/m.   Constitutional: NAD, calm, comfortable, tearful at times Eyes: PERRL, lids and conjunctivae normal ENMT: Mucous membranes are moist.  Musculoskeletal: no clubbing / cyanosis. No joint deformity upper and lower extremities. Good ROM, no contractures. Normal muscle tone.  Skin: see pic of left groin crease:       Psychiatric: Normal judgment and insight. Alert and oriented x 3. Tearful  Impression and Plan:  Hematoma -Small left groin crease hematoma, likely from small injury that she does not recall, ?maybe at time of colonoscopy. -No further intervention.  Anxiety -We have discussed different coping mechanisms, she has good friend and family support. -Would benefit from following with PCP for further treatment if deemed necessary.    Patient Instructions  -Hope you feel better soon!  -Nothing needs to be done for your small hematoma. If still bothers you in 2 weeks, please let us know.  -Schedule follow up with Dr. Ethlyn Gallery  ASAP to discuss your anxiety.     Lelon Frohlich, MD [facility]

## 2019-05-08 NOTE — Patient Instructions (Signed)
-  Hope you feel better soon!  -Nothing needs to be done for your small hematoma. If still bothers you in 2 weeks, please let us know.  -Schedule follow up with Dr. Ethlyn Gallery ASAP to discuss your anxiety.

## 2019-05-14 ENCOUNTER — Ambulatory Visit (INDEPENDENT_AMBULATORY_CARE_PROVIDER_SITE_OTHER): Payer: 59 | Admitting: Family Medicine

## 2019-05-14 ENCOUNTER — Telehealth: Payer: Self-pay | Admitting: *Deleted

## 2019-05-14 ENCOUNTER — Other Ambulatory Visit: Payer: Self-pay

## 2019-05-14 DIAGNOSIS — F419 Anxiety disorder, unspecified: Secondary | ICD-10-CM | POA: Diagnosis not present

## 2019-05-14 DIAGNOSIS — F321 Major depressive disorder, single episode, moderate: Secondary | ICD-10-CM

## 2019-05-14 MED ORDER — BUPROPION HCL ER (XL) 150 MG PO TB24: 150.0000 mg | ORAL_TABLET | Freq: Every day | ORAL | 1 refills | Status: DC

## 2019-05-14 NOTE — Telephone Encounter (Signed)
-----   Message from Caren Macadam, MD sent at 05/14/2019 10:06 AM EDT ----- Please give number for behavioral health; then schedule doxy for 2-3 weeks.

## 2019-05-14 NOTE — Progress Notes (Signed)
Virtual Visit via Video Note  I connected with Lori [name]  on 05/14/19 at  9:30 AM EDT by a video enabled telemedicine application and verified that I am speaking with the correct person using two identifiers.  Location patient: home Location provider:work or home office Persons participating in the virtual visit: patient, provider  I discussed the limitations of evaluation and management by telemedicine and the availability of in person appointments. The patient expressed understanding and agreed to proceed.   Lori [name] DOB: September 11, 1966 Encounter date: 05/14/2019  This is a 53 y.o. female who presents with No chief complaint on file.   History of present illness: Saw [facility] last week for lump in groin - thought to be hematoma. Also discussed anxiety with her and it was suggested she have follow up to discuss.   HPI  Knot has improved; pain on left side has improved. Not doing anything to make it go away.   Really weepy in last couple of weeks. Had run out of effexor; which she thinks was related. With everything going on with COVID, working from home, etc this has worsened. At times hard to have conversation; just crying for no reason. Feeling bad, not sleeping well, no energy.   Took 2 melatonin night before her last visit here and still took her until 4am to go to sleep. Just hard time dealing with things right now. Took a few of valium over weekend which seemed to help.   Was out of effexor for a few days. Still has hot flashes (was worse when she was out of medication). Energy is biggest issue.   Supposed to get injection Tuesday for back. Back is really bothering her. Has had 5 since 2007 and only a couple have helped. For pain, not sure that nerve ablation helped much either although they were talking about it. Even simple activities like watering flowers is difficult. Tried using back brace which helps, but she is just getting frustrated with the pain.   Trying herbal supplement  with black cohosh. Feels like this is helping her a little.   Has been separated for 4 years, but still has difficulty with accomplishing things related to that relationship. Hates having to talk with ex; doesn't like dealing with paperwork/legal issues, etc.   GAD 7 : Generalized Anxiety Score 05/14/2019  Nervous, Anxious, on Edge 3  Control/stop worrying 2  Worry too much - different things 2  Trouble relaxing 2  Restless 2  Easily annoyed or irritable 2  Afraid - awful might happen 1  Total GAD 7 Score 14  Anxiety Difficulty Very difficult  Can't get things done around house. Depression screen [facility] 2/9 05/14/2019 10/13/2016 05/14/2015 12/23/2013 12/23/2013  Decreased Interest - 0 0 0 0  Down, Depressed, Hopeless 2 1 1  0 0  PHQ - 2 Score 2 1 1  0 0  Altered sleeping 3 - - - -  Tired, decreased energy 3 - - - -  Change in appetite 3 - - - -  Feeling bad or failure about yourself  2 - - - -  Trouble concentrating 2 - - - -  Moving slowly or fidgety/restless 2 - - - -  Suicidal thoughts 0 - - - -  PHQ-9 Score 17 - - - -  Difficult doing work/chores Extremely dIfficult - - - -   Has done therapy in past prior to and just after separation. Initially didn't think helpful, but it was more helpful during second time  that she did it. Helped her have new approach with looking at feelings/surrounding.   States that focus and motivation are the worst things for her.     Allergies  Allergen Reactions  . Amoxicillin Nausea Only    Yeast Infection Has patient had a PCN reaction causing immediate rash, facial/tongue/throat swelling, SOB or lightheadedness with hypotension: no Has patient had a PCN reaction causing severe rash involving mucus membranes or skin necrosis: no Has patient had a PCN reaction that required hospitalization yes Has patient had a PCN reaction occurring within the last 10 years: yes If all of the above answers are "NO", then may proceed with Cephalosporin use.  Yeast  Infection Has patient had a PCN reaction causing immediate rash, facial/tongue/throat swelling, SOB or lightheadedness with hypotension: no Has patient had a PCN reaction causing severe rash involving mucus membranes or skin necrosis: no Has patient had a PCN reaction that required hospitalization yes Has patient had a PCN reaction occurring within the last 10 years: yes If all of the above answers are "NO", then may proceed with Cephalosporin use.  Marland Kitchen Morphine Other (See Comments)    REACTION: hyper REACTION: hyper  . Thiethylperazine Other (See Comments)  . Latex Rash   No outpatient medications have been marked as taking for the 05/14/19 encounter (Office Visit) with Caren Macadam, MD.   Current Facility-Administered Medications for the 05/14/19 encounter (Office Visit) with Caren Macadam, MD  Medication  . 0.9 %  sodium chloride infusion    Review of Systems  Constitutional: Negative for chills, fatigue and fever.  Respiratory: Negative for cough, chest tightness, shortness of breath and wheezing.   Cardiovascular: Negative for chest pain, palpitations and leg swelling.  Psychiatric/Behavioral: Positive for decreased concentration. Negative for suicidal ideas. The patient is nervous/anxious.     Objective:  LMP 09/27/2016 (Approximate)       BP Readings from Last 3 Encounters:  05/08/19 120/80  04/22/19 133/74  02/11/19 (!) 144/86   Wt Readings from Last 3 Encounters:  05/08/19 224 lb 8 oz (101.8 kg)  04/22/19 225 lb (102.1 kg)  02/11/19 225 lb 9 oz (102.3 kg)    EXAM:  GENERAL: alert, oriented, appears well and in no acute distress  HEENT: atraumatic, conjunctiva clear, no obvious abnormalities on inspection of external nose and ears  NECK: normal movements of the head and neck  LUNGS: on inspection no signs of respiratory distress, breathing rate appears normal, no obvious gross SOB, gasping or wheezing  CV: no obvious cyanosis  MS: moves all visible  extremities without noticeable abnormality  PSYCH/NEURO: pleasant and cooperative.does get tearful at times, mostly when talking about ex-husband and interacting with him/trying to get legal paperwork completed.   Assessment/Plan  1. Depression, major, single episode, moderate ([place]) After discussion of medication options, we decided to try adding on Wellbutrin.  She had tried this in the past without much improvement, but she believes she took this medication alone.  We discussed that as an add-on, sometimes it can be more helpful. - buPROPion (WELLBUTRIN XL) 150 MG 24 hr tablet; Take 1 tablet (150 mg total) by mouth daily. Increase after 7 days to 2 tablets (300mg ) daily.  Dispense: 90 tablet; Refill: 1  2. Anxiety Will work with wellbutrin first; consider raising effexor dose pending response to wellbutrin.  Suggested consideration for therapy as I think this would be very helpful for her.  We discussed setting small goals for the day so that she does not  feel overwhelmed or "like a failure" at the end of the day.  We discussed importance of daily exercise to help with mood and overall health.  This is more difficult due to her chronic back issues, but she will try to work on this.    Return in about 2 weeks (around 05/28/2019) for Chronic condition visit.   I discussed the assessment and treatment plan with the patient. The patient was provided an opportunity to ask questions and all were answered. The patient agreed with the plan and demonstrated an understanding of the instructions.   The patient was advised to call back or seek an in-person evaluation if the symptoms worsen or if the condition fails to improve as anticipated.  I provided 30 minutes of non-face-to-face time during this encounter.   Micheline Rough, MD

## 2019-05-14 NOTE — Telephone Encounter (Signed)
I called the pt and gave her the number to call [facility] at [phone].  Follow up visit scheduled for 6/19.

## 2019-05-30 ENCOUNTER — Ambulatory Visit (INDEPENDENT_AMBULATORY_CARE_PROVIDER_SITE_OTHER): Payer: 59 | Admitting: Family Medicine

## 2019-05-30 ENCOUNTER — Encounter: Payer: Self-pay | Admitting: Family Medicine

## 2019-05-30 ENCOUNTER — Other Ambulatory Visit: Payer: Self-pay

## 2019-05-30 DIAGNOSIS — L0292 Furuncle, unspecified: Secondary | ICD-10-CM

## 2019-05-30 DIAGNOSIS — F419 Anxiety disorder, unspecified: Secondary | ICD-10-CM

## 2019-05-30 DIAGNOSIS — F321 Major depressive disorder, single episode, moderate: Secondary | ICD-10-CM | POA: Diagnosis not present

## 2019-05-30 MED ORDER — DOXYCYCLINE HYCLATE 100 MG PO TABS: 100.0000 mg | ORAL_TABLET | Freq: Two times a day (BID) | ORAL | 0 refills | Status: DC

## 2019-05-30 NOTE — Progress Notes (Signed)
Virtual Visit via Video Note  I connected with Lizbett Garciagarcia Goins  on 05/30/19 at  1:30 PM EDT by a video enabled telemedicine application and verified that I am speaking with the correct person using two identifiers.  Location patient: home Location provider:work or home office Persons participating in the virtual visit: patient, provider  I discussed the limitations of evaluation and management by telemedicine and the availability of in person appointments. The patient expressed understanding and agreed to proceed.   PEYTYN TRINE DOB: 1966-11-20 Encounter date: 05/30/2019  This is a 53 y.o. female who presents with  No chief complaint on file.   History of present illness: Last visit 05/14/19 we discussed depression and anxiety. Not feeling well, no energy, feeling bad, not sleeping well. We added wellbutrin to effexor and discussed consideration for counseling but also working on setting small goals for day so she doesn't feel like failure. Work on daily activity as well (although back pain was limiting)  Went back to work on Monday. Thinks maybe it has been good for her to be back in the office. Thinks that medication is helping some. She also had back injection since we last talked. Had L4 injection which was right where pain was and helped with radiation of pain. She has been more active because of this. Has dropped 6lbs in a week and a half. Once she went up to the wellbutrin BID felt like things didn't bother her as much and felt a little more motivation with getting up to do things.   Area in groin - she feels now like it might be boil - coming up closer to skin; very tender. Last visit for this was 05/08/19)  Allergies  Allergen Reactions  . Amoxicillin Nausea Only    Yeast Infection Has patient had a PCN reaction causing immediate rash, facial/tongue/throat swelling, SOB or lightheadedness with hypotension: no Has patient had a PCN reaction causing severe rash involving mucus membranes  or skin necrosis: no Has patient had a PCN reaction that required hospitalization yes Has patient had a PCN reaction occurring within the last 10 years: yes If all of the above answers are "NO", then may proceed with Cephalosporin use.  Yeast Infection Has patient had a PCN reaction causing immediate rash, facial/tongue/throat swelling, SOB or lightheadedness with hypotension: no Has patient had a PCN reaction causing severe rash involving mucus membranes or skin necrosis: no Has patient had a PCN reaction that required hospitalization yes Has patient had a PCN reaction occurring within the last 10 years: yes If all of the above answers are "NO", then may proceed with Cephalosporin use.  Marland Kitchen Morphine Other (See Comments)    REACTION: hyper REACTION: hyper  . Thiethylperazine Other (See Comments)  . Latex Rash   No outpatient medications have been marked as taking for the 05/30/19 encounter (Office Visit) with Caren Macadam, MD.   Current Facility-Administered Medications for the 05/30/19 encounter (Office Visit) with Caren Macadam, MD  Medication  . 0.9 %  sodium chloride infusion    Review of Systems  Constitutional: Negative for chills, fatigue and fever.  Respiratory: Negative for cough, chest tightness, shortness of breath and wheezing.   Cardiovascular: Negative for chest pain, palpitations and leg swelling.  Skin: Positive for color change.       See hpi  Psychiatric/Behavioral: Negative for sleep disturbance. The patient is not nervous/anxious.        Anxiety and sad mood have improved significantly.  She is  no longer having issues with being tearful during the day.  She has more motivation than she had previously.    Objective:  LMP 09/27/2016 (Approximate)       BP Readings from Last 3 Encounters:  05/08/19 120/80  04/22/19 133/74  02/11/19 (!) 144/86   Wt Readings from Last 3 Encounters:  05/08/19 224 lb 8 oz (101.8 kg)  04/22/19 225 lb (102.1 kg)   02/11/19 225 lb 9 oz (102.3 kg)    EXAM:  GENERAL: alert, oriented, appears well and in no acute distress  HEENT: atraumatic, conjunctiva clear, no obvious abnormalities on inspection of external nose and ears  NECK: normal movements of the head and neck  LUNGS: on inspection no signs of respiratory distress, breathing rate appears normal, no obvious gross SOB, gasping or wheezing  CV: no obvious cyanosis  MS: moves all visible extremities without noticeable abnormality  PSYCH/NEURO: pleasant and cooperative, no obvious depression or anxiety, speech and thought processing grossly intact  SKIN: Difficult to view area of concern in the left groin.  Does appear to be approximately 2 cm erythematous nodule.  This is tender to touch.  Assessment/Plan  1. Depression, major, single episode, moderate ([place]) Has had improvement of sx with restarting effexor and wellbutrin addition. More motivation, help with weight loss, less tearful.  2. Anxiety See above; overall doing better with this. Will plan for 1 month CCV to recheck.  3. Boil Warm compresses; doxy. Will check in with her on Monday to see how it is looking; if not improved will need to have in office visit.  - doxycycline (VIBRA-TABS) 100 MG tablet; Take 1 tablet (100 mg total) by mouth 2 (two) times daily for 7 days.  Dispense: 14 tablet; Refill: 0    Return in about 1 month (around 06/29/2019) for Chronic condition visit.   I discussed the assessment and treatment plan with the patient. The patient was provided an opportunity to ask questions and all were answered. The patient agreed with the plan and demonstrated an understanding of the instructions.   The patient was advised to call back or seek an in-person evaluation if the symptoms worsen or if the condition fails to improve as anticipated.  I provided 30 minutes of non-face-to-face time during this encounter.   Micheline Rough, MD

## 2019-06-02 ENCOUNTER — Telehealth: Payer: Self-pay | Admitting: *Deleted

## 2019-06-02 NOTE — Telephone Encounter (Signed)
I left a message for the pt to return my call. 

## 2019-06-02 NOTE — Telephone Encounter (Signed)
-----   Message from Caren Macadam, MD sent at 05/30/2019  1:58 PM EDT ----- Please check in with her on Monday - make sure that "boil" is improving. If not will need in office evaluation.

## 2019-06-04 ENCOUNTER — Encounter: Payer: Self-pay | Admitting: Family Medicine

## 2019-06-04 NOTE — Telephone Encounter (Signed)
I called the pt and offered an appt for today at 1:30pm.  Patient stated she cannot come in until after 4:30pm today and due to having to give a 24-hour notice with her job.  Appt scheduled for tomorrow at 8:15am.

## 2019-06-04 NOTE — Telephone Encounter (Signed)
See My chart message

## 2019-06-05 ENCOUNTER — Ambulatory Visit (INDEPENDENT_AMBULATORY_CARE_PROVIDER_SITE_OTHER): Payer: 59 | Admitting: Family Medicine

## 2019-06-05 ENCOUNTER — Other Ambulatory Visit: Payer: Self-pay

## 2019-06-05 ENCOUNTER — Encounter: Payer: Self-pay | Admitting: Family Medicine

## 2019-06-05 VITALS — BP 130/64 | HR 92 | Temp 98.4°F | Wt 224.1 lb

## 2019-06-05 DIAGNOSIS — L0292 Furuncle, unspecified: Secondary | ICD-10-CM | POA: Diagnosis not present

## 2019-06-05 DIAGNOSIS — L02214 Cutaneous abscess of groin: Secondary | ICD-10-CM | POA: Diagnosis not present

## 2019-06-05 MED ORDER — FLUCONAZOLE 150 MG PO TABS: 150.0000 mg | ORAL_TABLET | Freq: Once | ORAL | 0 refills | Status: DC

## 2019-06-05 MED ORDER — DOXYCYCLINE HYCLATE 100 MG PO TABS: 100.0000 mg | ORAL_TABLET | Freq: Two times a day (BID) | ORAL | 0 refills | Status: AC

## 2019-06-05 NOTE — Patient Instructions (Signed)
Continue with warm compresses several times daily  Keep covered as long as this is draining  Keep surface clean with soap and water  Continue doxycycline for another 7 to 10 days  Follow-up for any increased redness, warmth, or pain

## 2019-06-05 NOTE — Progress Notes (Signed)
Subjective:     Patient ID: Lori [name], female   DOB: Nov 27, 1966, 53 y.o.   MRN: [id number]  HPI Patient is seen with draining lesion left inguinal region.  She first noticed some swelling there a few weeks ago.  She states this was about the size of a "marble ".  She had some increasing pain and mild redness and then this started draining several days ago.  She started on the 19th on doxycycline.  She at this point has very minimal drainage.  She has no history of MRSA.  Overall, less painful since started draining.  She has some whitish discoloration of her tongue and is concerned she has "thrush".  She states the only thing that has cleared this in the past is fluconazole tablet.  No pain with swallowing  Past Medical History:  Diagnosis Date  . ALLERGIC RHINITIS 03/14/2010  . BACK PAIN 01/24/2010  . Buttock pain   . GERD 12/01/2008  . HIATAL HERNIA 05/27/2010  . 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 Numbness and tingling of left upper and lower extremity   . OSTEOARTHRITIS 12/01/2008  . Sleep apnea   . Thigh pain   . 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 TOBACCO USE 12/01/2008  . VITAMIN D DEFICIENCY 05/23/2010   Past Surgical History:  Procedure Laterality Date  . ABDOMINAL HYSTERECTOMY N/A 10/17/2016   Procedure: HYSTERECTOMY ABDOMINAL;  Surgeon: Sanjuana Kava, MD;  Location: [facility]; benign mass in uterus  . APPENDECTOMY    . BREAST SURGERY     lumpectomy  . CERVICAL FUSION     x2; 2007, 2011  . CESAREAN SECTION  1988  . COLONOSCOPY    . CYSTOSCOPY N/A 10/17/2016   Procedure: CYSTOSCOPY;  Surgeon: Sanjuana Kava, MD;  Location: [facility];  Service: Gynecology;  Laterality: N/A;  . DILATION AND CURETTAGE OF UTERUS     miscarriage  . ESOPHAGEAL MANOMETRY    . ESOPHAGOGASTRODUODENOSCOPY    . GASTRIC FUNDOPLICATION     [place]  . LAPAROSCOPIC NISSEN FUNDOPLICATION    . LUMBAR DISC SURGERY    . LUMBAR LAMINECTOMY  2005  . SALPINGOOPHORECTOMY Bilateral 10/17/2016   Procedure: SALPINGO  OOPHORECTOMY;  Surgeon: Sanjuana Kava, MD;  Location: [facility];  Service: Gynecology;  Laterality: Bilateral;  . TONSILLECTOMY AND ADENOIDECTOMY    . UPPER GASTROINTESTINAL ENDOSCOPY      reports that she has been smoking cigarettes. She has a 31.00 pack-year smoking history. She has never used smokeless tobacco. She reports current alcohol use. She reports that she does not use drugs. family history includes Alcohol abuse in her father; Alzheimer's disease in her father; Breast cancer (age of onset: 71) in her mother; Colon polyps in her half-sister; Colonic polyp in her half-brother; Diabetes in her mother; Diverticulitis (age of onset: 68) in her mother; Heart attack (age of onset: 65) in her mother; High Cholesterol in her mother; High blood pressure in her mother. Allergies  Allergen Reactions  . Amoxicillin Nausea Only    Yeast Infection Has patient had a PCN reaction causing immediate rash, facial/tongue/throat swelling, SOB or lightheadedness with hypotension: no Has patient had a PCN reaction causing severe rash involving mucus membranes or skin necrosis: no Has patient had a PCN reaction that required hospitalization yes Has patient had a PCN reaction occurring within the last 10 years: yes If all of the above answers are "NO", then may proceed with Cephalosporin use.  Yeast Infection Has patient had a PCN reaction causing immediate rash,  facial/tongue/throat swelling, SOB or lightheadedness with hypotension: no Has patient had a PCN reaction causing severe rash involving mucus membranes or skin necrosis: no Has patient had a PCN reaction that required hospitalization yes Has patient had a PCN reaction occurring within the last 10 years: yes If all of the above answers are "NO", then may proceed with Cephalosporin use.  Marland Kitchen Morphine Other (See Comments)    REACTION: hyper REACTION: hyper  . Thiethylperazine Other (See Comments)  . Latex Rash     Review of Systems  Constitutional:  Negative for chills and fever.       Objective:   Physical Exam Constitutional:      Appearance: Normal appearance.  HENT:     Mouth/Throat:     Comments: She has some whitish coating on her tongue but no evidence for thrush otherwise Cardiovascular:     Rate and Rhythm: Normal rate and regular rhythm.  Skin:    Comments: Left groin reveals approximately 1 x 1 cm area of induration.  No fluctuance.  No erythema.  She has a very small punctate center that is draining some serosanguineous fluid.  No obvious purulence.  No foul odor.  Nontender.  Neurological:     Mental Status: She is alert.        Assessment:     Abscess left groin which is draining spontaneously    Plan:     -We did not perform incision and drainage since this is draining and indurated and non-fluctuant -Have recommended continuing doxycycline for another 7 days along with warm compresses several times daily -Follow-up promptly for any increased redness, warmth, pain, or fluctuance  Eulas Post MD [facility]

## 2019-06-21 ENCOUNTER — Other Ambulatory Visit: Payer: Self-pay | Admitting: Family Medicine

## 2019-06-21 DIAGNOSIS — F321 Major depressive disorder, single episode, moderate: Secondary | ICD-10-CM

## 2019-06-27 ENCOUNTER — Other Ambulatory Visit: Payer: Self-pay | Admitting: Family Medicine

## 2019-06-27 ENCOUNTER — Encounter: Payer: Self-pay | Admitting: Family Medicine

## 2019-06-27 DIAGNOSIS — F321 Major depressive disorder, single episode, moderate: Secondary | ICD-10-CM

## 2019-06-27 MED ORDER — BUPROPION HCL ER (XL) 150 MG PO TB24: 300.0000 mg | ORAL_TABLET | Freq: Every day | ORAL | 1 refills | Status: DC

## 2019-06-27 MED ORDER — DIAZEPAM 5 MG PO TABS: ORAL_TABLET | ORAL | 0 refills | Status: DC

## 2019-06-27 MED ORDER — FLUCONAZOLE 150 MG PO TABS: 150.0000 mg | ORAL_TABLET | Freq: Once | ORAL | 0 refills | Status: AC

## 2019-07-02 ENCOUNTER — Encounter: Payer: Self-pay | Admitting: Family Medicine

## 2019-07-15 ENCOUNTER — Encounter: Payer: Self-pay | Admitting: Primary Care

## 2019-07-15 ENCOUNTER — Other Ambulatory Visit: Payer: Self-pay

## 2019-07-15 ENCOUNTER — Ambulatory Visit: Payer: 59 | Admitting: Primary Care

## 2019-07-15 DIAGNOSIS — G4733 Obstructive sleep apnea (adult) (pediatric): Secondary | ICD-10-CM

## 2019-07-15 DIAGNOSIS — F17200 Nicotine dependence, unspecified, uncomplicated: Secondary | ICD-10-CM | POA: Diagnosis not present

## 2019-07-15 NOTE — Patient Instructions (Addendum)
Aim to wear CPAP mask every night for 4-6 hours or more  Do not drive if experiencing excessive daytime fatigue or somnolence  Do not take sedation medication or drink alcohol in excess prior to bedtime as these can worsen sleep apnea   Continue to work on smoking cessation (taper amount down and pick a quit date)  Referral: Lung cancer screening clinic   Follow-up: FU in 6 months with sleep MD only (new patient)   Steps to Quit Smoking Smoking tobacco is the leading cause of preventable death. It can affect almost every organ in the body. Smoking puts you and those around you at risk for developing many serious chronic diseases. Quitting smoking can be difficult, but it is one of the best things that you can do for your health. It is never too late to quit. How do I get ready to quit? When you decide to quit smoking, create a plan to help you succeed. Before you quit:  Pick a date to quit. Set a date within the next 2 weeks to give you time to prepare.  Write down the reasons why you are quitting. Keep this list in places where you will see it often.  Tell your family, friends, and co-workers that you are quitting. Support from your loved ones can make quitting easier.  Talk with your health care provider about your options for quitting smoking.  Find out what treatment options are covered by your health insurance.  Identify people, places, things, and activities that make you want to smoke (triggers). Avoid them. What first steps can I take to quit smoking?  Throw away all cigarettes at home, at work, and in your car.  Throw away smoking accessories, such as Scientist, research (medical).  Clean your car. Make sure to empty the ashtray.  Clean your home, including curtains and carpets. What strategies can I use to quit smoking? Talk with your health care provider about combining strategies, such as taking medicines while you are also receiving in-person counseling. Using these  two strategies together makes you more likely to succeed in quitting than if you used either strategy on its own.  If you are pregnant or breastfeeding, talk with your health care provider about finding counseling or other support strategies to quit smoking. Do not take medicine to help you quit smoking unless your health care provider tells you to do so. To quit smoking: Quit right away  Quit smoking completely, instead of gradually reducing how much you smoke over a period of time. Research shows that stopping smoking right away is more successful than gradually quitting.  Attend in-person counseling to help you build problem-solving skills. You are more likely to succeed in quitting if you attend counseling sessions regularly. Even short sessions of 10 minutes can be effective. Take medicine You may take medicines to help you quit smoking. Some medicines require a prescription and some you can purchase over-the-counter. Medicines may have nicotine in them to replace the nicotine in cigarettes. Medicines may:  Help to stop cravings.  Help to relieve withdrawal symptoms. Your health care provider may recommend:  Nicotine patches, gum, or lozenges.  Nicotine inhalers or sprays.  Non-nicotine medicine that is taken by mouth. Find resources Find resources and support systems that can help you to quit smoking and remain smoke-free after you quit. These resources are most helpful when you use them often. They include:  Online chats with a Social worker.  Telephone quitlines.  Printed Furniture conservator/restorer.  Support groups  or group counseling.  Text messaging programs.  Mobile phone apps or applications. Use apps that can help you stick to your quit plan by providing reminders, tips, and encouragement. There are many free apps for mobile devices as well as websites. Examples include Quit Guide from the [employer] and smokefree.gov What things can I do to make it easier to quit?   Reach out to  your family and friends for support and encouragement. Call telephone quitlines (1-800-QUIT-NOW), reach out to support groups, or work with a counselor for support.  Ask people who smoke to avoid smoking around you.  Avoid places that trigger you to smoke, such as bars, parties, or smoke-break areas at work.  Spend time with people who do not smoke.  Lessen the stress in your life. Stress can be a smoking trigger for some people. To lessen stress, try: ? Exercising regularly. ? Doing deep-breathing exercises. ? Doing yoga. ? Meditating. ? Performing a body scan. This involves closing your eyes, scanning your body from head to toe, and noticing which parts of your body are particularly tense. Try to relax the muscles in those areas. How will I feel when I quit smoking? Day 1 to 3 weeks Within the first 24 hours of quitting smoking, you may start to feel withdrawal symptoms. These symptoms are usually most noticeable 2-3 days after quitting, but they usually do not last for more than 2-3 weeks. You may experience these symptoms:  Mood swings.  Restlessness, anxiety, or irritability.  Trouble concentrating.  Dizziness.  Strong cravings for sugary foods and nicotine.  Mild weight gain.  Constipation.  Nausea.  Coughing or a sore throat.  Changes in how the medicines that you take for unrelated issues work in your body.  Depression.  Trouble sleeping (insomnia). Week 3 and afterward After the first 2-3 weeks of quitting, you may start to notice more positive results, such as:  Improved sense of smell and taste.  Decreased coughing and sore throat.  Slower heart rate.  Lower blood pressure.  Clearer skin.  The ability to breathe more easily.  Fewer sick days. Quitting smoking can be very challenging. Do not get discouraged if you are not successful the first time. Some people need to make many attempts to quit before they achieve long-term success. Do your best to  stick to your quit plan, and talk with your health care provider if you have any questions or concerns. Summary  Smoking tobacco is the leading cause of preventable death. Quitting smoking is one of the best things that you can do for your health.  When you decide to quit smoking, create a plan to help you succeed.  Quit smoking right away, not slowly over a period of time.  When you start quitting, seek help from your health care provider, family, or friends. This information is not intended to replace advice given to you by your health care provider. Make sure you discuss any questions you have with your health care provider. Document Released: 11/21/2001 Document Revised: 02/14/2019 Document Reviewed: 02/15/2019 Elsevier Patient Education  [place].   CPAP and BPAP Information CPAP and BPAP are methods of helping a person breathe with the use of air pressure. CPAP stands for "continuous positive airway pressure." BPAP stands for "bi-level positive airway pressure." In both methods, air is blown through your nose or mouth and into your air passages to help you breathe well. CPAP and BPAP use different amounts of pressure to blow air. With CPAP,  the amount of pressure stays the same while you breathe in and out. With BPAP, the amount of pressure is increased when you breathe in (inhale) so that you can take larger breaths. Your health care provider will recommend whether CPAP or BPAP would be more helpful for you. Why are CPAP and BPAP treatments used? CPAP or BPAP can be helpful if you have:  Sleep apnea.  Chronic obstructive pulmonary disease (COPD).  Heart failure.  Medical conditions that weaken the muscles of the chest including muscular dystrophy, or neurological diseases such as amyotrophic lateral sclerosis (ALS).  Other problems that cause breathing to be weak, abnormal, or difficult. CPAP is most commonly used for obstructive sleep apnea (OSA) to keep the airways  from collapsing when the muscles relax during sleep. How is CPAP or BPAP administered? Both CPAP and BPAP are provided by a small machine with a flexible plastic tube that attaches to a plastic mask. You wear the mask. Air is blown through the mask into your nose or mouth. The amount of pressure that is used to blow the air can be adjusted on the machine. Your health care provider will determine the pressure setting that should be used based on your individual needs. When should CPAP or BPAP be used? In most cases, the mask only needs to be worn during sleep. Generally, the mask needs to be worn throughout the night and during any daytime naps. People with certain medical conditions may also need to wear the mask at other times when they are awake. Follow instructions from your health care provider about when to use the machine. What are some tips for using the mask?   Because the mask needs to be snug, some people feel trapped or closed-in (claustrophobic) when first using the mask. If you feel this way, you may need to get used to the mask. One way to do this is by holding the mask loosely over your nose or mouth and then gradually applying the mask more snugly. You can also gradually increase the amount of time that you use the mask.  Masks are available in various types and sizes. Some fit over your mouth and nose while others fit over just your nose. If your mask does not fit well, talk with your health care provider about getting a different one.  If you are using a mask that fits over your nose and you tend to breathe through your mouth, a chin strap may be applied to help keep your mouth closed.  The CPAP and BPAP machines have alarms that may sound if the mask comes off or develops a leak.  If you have trouble with the mask, it is very important that you talk with your health care provider about finding a way to make the mask easier to tolerate. Do not stop using the mask. Stopping the use  of the mask could have a negative impact on your health. What are some tips for using the machine?  Place your CPAP or BPAP machine on a secure table or stand near an electrical outlet.  Know where the on/off switch is located on the machine.  Follow instructions from your health care provider about how to set the pressure on your machine and when you should use it.  Do not eat or drink while the CPAP or BPAP machine is on. Food or fluids could get pushed into your lungs by the pressure of the CPAP or BPAP.  Do not smoke. Tobacco smoke residue  can damage the machine.  For home use, CPAP and BPAP machines can be rented or purchased through home health care companies. Many different brands of machines are available. Renting a machine before purchasing may help you find out which particular machine works well for you.  Keep the CPAP or BPAP machine and attachments clean. Ask your health care provider for specific instructions. Get help right away if:  You have redness or open areas around your nose or mouth where the mask fits.  You have trouble using the CPAP or BPAP machine.  You cannot tolerate wearing the CPAP or BPAP mask.  You have pain, discomfort, and bloating in your abdomen. Summary  CPAP and BPAP are methods of helping a person breathe with the use of air pressure.  Both CPAP and BPAP are provided by a small machine with a flexible plastic tube that attaches to a plastic mask.  If you have trouble with the mask, it is very important that you talk with your health care provider about finding a way to make the mask easier to tolerate. This information is not intended to replace advice given to you by your health care provider. Make sure you discuss any questions you have with your health care provider. Document Released: 08/25/2004 Document Revised: 03/19/2019 Document Reviewed: 10/16/2016 Elsevier Patient Education  2020 [employer].

## 2019-07-15 NOTE — Progress Notes (Signed)
@Patient  ID: Lori [name], female    DOB: Jun 27, 1966, 53 y.o.   MRN: [id number]  Chief Complaint  Patient presents with  . Follow-up    having trouble keeping nasal pillows on during the night    Referring provider: Caren Macadam, MD  HPI: 53 year old female, current every day smoker 1 ppd (35 pack year hx). PMH significant for OSA on CPAP, GERD, allergic rhinitis, hiatal hernia, TMJ, obesity, tobacco abuse. Former patient of Dr. Celesta Aver, last seen by pulmonary NP on 02/07/18. HST 05/2016 AHI 5 but very symptomatic.   07/15/2019 Patient presents today for annual OSA follow-up. She has moderate compliance with CPAP use. She knows her compliance is down. States that she has trouble applying mask d/t previous carpale tunnel and wakes up at night not wearing it. She is using nasal pillow and comes off easily. Had better compliance at last visit when she was wearing a different mask. Due for new head gear August 10th. She has noticed since using CPAP she is not as sleepy during the day. [place] 10. Still smoking 1 ppd, has not started chantix.   Airview download 16/30 days; 40% >4 hours Average usage 4 hours 46 mins Pressure 5-15cm h20 (9.8) Leaks 10.0 L/min AHI 0.6   Allergies  Allergen Reactions  . Amoxicillin Nausea Only    Yeast Infection Has patient had a PCN reaction causing immediate rash, facial/tongue/throat swelling, SOB or lightheadedness with hypotension: no Has patient had a PCN reaction causing severe rash involving mucus membranes or skin necrosis: no Has patient had a PCN reaction that required hospitalization yes Has patient had a PCN reaction occurring within the last 10 years: yes If all of the above answers are "NO", then may proceed with Cephalosporin use.  Yeast Infection Has patient had a PCN reaction causing immediate rash, facial/tongue/throat swelling, SOB or lightheadedness with hypotension: no Has patient had a PCN reaction causing severe rash  involving mucus membranes or skin necrosis: no Has patient had a PCN reaction that required hospitalization yes Has patient had a PCN reaction occurring within the last 10 years: yes If all of the above answers are "NO", then may proceed with Cephalosporin use.  Marland Kitchen Morphine Other (See Comments)    REACTION: hyper REACTION: hyper  . Thiethylperazine Other (See Comments)  . Latex Rash    Immunization History  Administered Date(s) Administered  . Tdap 12/23/2013    Past Medical History:  Diagnosis Date  . ALLERGIC RHINITIS 03/14/2010  . BACK PAIN 01/24/2010  . Buttock pain   . GERD 12/01/2008  . HIATAL HERNIA 05/27/2010  . 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 Numbness and tingling of left upper and lower extremity   . OSTEOARTHRITIS 12/01/2008  . Sleep apnea   . Thigh pain   . 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 TOBACCO USE 12/01/2008  . VITAMIN D DEFICIENCY 05/23/2010    Tobacco History: Social History   Tobacco Use  Smoking Status Current Every Day Smoker  . Packs/day: 1.00  . Years: 31.00  . Pack years: 31.00  . Types: Cigarettes  Smokeless Tobacco Never Used   Ready to quit: Not Answered Counseling given: Not Answered   Outpatient Medications Prior to Visit  Medication Sig Dispense Refill  . Biotin w/ Vitamins C & E (HAIR/SKIN/NAILS PO) Take 1 tablet by mouth every morning.    Marland Kitchen buPROPion (WELLBUTRIN XL) 150 MG 24 hr tablet Take 2 tablets (300 mg total) by mouth daily. 180 tablet  1  . cetirizine (ZYRTEC) 10 MG tablet Take 10 mg by mouth at bedtime.    . diazepam (VALIUM) 5 MG tablet TAKE 1 TABLET BY MOUTH EVERY DAY AT BEDTIME AS NEEDED 30 tablet 0  . fluticasone (FLONASE) 50 MCG/ACT nasal spray Place 2 sprays into both nostrils daily.    Marland Kitchen oxyCODONE-acetaminophen (PERCOCET) 10-325 MG tablet Take 1 tablet by mouth every 6 (six) hours as needed for pain.    Marland Kitchen tolterodine (DETROL) 2 MG tablet Take 2 mg by mouth daily as needed.     . venlafaxine XR (EFFEXOR-XR) 150 MG 24  hr capsule Take 150 mg by mouth at bedtime.     . varenicline (CHANTIX STARTING MONTH PAK) 0.5 MG X 11 & 1 MG X 42 tablet TAKE AS DIRECTED ON PACKAGE. (Patient not taking: Reported on 07/15/2019) 53 tablet 0   Facility-Administered Medications Prior to Visit  Medication Dose Route Frequency Provider Last Rate Last Dose  . 0.9 %  sodium chloride infusion  500 mL Intravenous Once [place], MD        Review of Systems  Review of Systems  Constitutional: Negative.   Respiratory: Negative.   Cardiovascular: Negative.   Psychiatric/Behavioral: Negative.    Physical Exam  BP 122/78 (BP Location: Right Arm, Patient Position: Sitting, Cuff Size: Normal)   Pulse 93   Temp 98.1 F (36.7 C)   Ht 5\' 5"  (1.651 m)   Wt 223 lb 12.8 oz (101.5 kg)   LMP 09/27/2016 (Approximate)   SpO2 99%   BMI 37.24 kg/m  Physical Exam Constitutional:      Appearance: She is well-developed.  HENT:     Head: Normocephalic and atraumatic.  Eyes:     Pupils: Pupils are equal, round, and reactive to light.  Neck:     Musculoskeletal: Normal range of motion and neck supple.  Cardiovascular:     Rate and Rhythm: Normal rate and regular rhythm.     Heart sounds: Normal heart sounds. No murmur.  Pulmonary:     Effort: Pulmonary effort is normal. No respiratory distress.     Breath sounds: Normal breath sounds. No wheezing.  Skin:    General: Skin is warm and dry.     Findings: No erythema or rash.  Neurological:     Mental Status: She is alert and oriented to person, place, and time.  Psychiatric:        Mood and Affect: Mood normal.        Behavior: Behavior normal.        Thought Content: Thought content normal.        Judgment: Judgment normal.      Lab Results:  CBC    Component Value Date/Time   WBC 10.9 (H) 01/20/2019 1322   RBC 4.59 01/20/2019 1322   HGB 14.9 01/20/2019 1322   HCT 43.9 01/20/2019 1322   PLT 227.0 01/20/2019 1322   MCV 95.7 01/20/2019 1322   MCH 33.2 10/18/2016  0546   MCHC 34.0 01/20/2019 1322   RDW 13.5 01/20/2019 1322   LYMPHSABS 4.6 (H) 01/20/2019 1322   MONOABS 0.5 01/20/2019 1322   EOSABS 0.2 01/20/2019 1322   BASOSABS 0.1 01/20/2019 1322    BMET    Component Value Date/Time   NA 139 01/20/2019 1322   K 4.4 01/20/2019 1322   CL 100 01/20/2019 1322   CO2 30 01/20/2019 1322   GLUCOSE 84 01/20/2019 1322   BUN 10 01/20/2019 1322   CREATININE 0.73  01/20/2019 1322   CALCIUM 9.9 01/20/2019 1322   GFRNONAA >60 10/17/2016 1722   GFRAA >60 10/17/2016 1722    BNP No results found for: BNP  ProBNP No results found for: PROBNP  Imaging: No results found.   Assessment & Plan:   OSA (obstructive sleep apnea) - Moderate compliance - Pressure 5-10cm H20; AHI 0.6 - Reinforce importance of wearing CPAP every night for minimum of 4-6 hours  - Needs new head gear  - FU in 6 months  TOBACCO USE - Smokes 1 ppd (35 pack year hx) - She is interested in quitting, counseling provided  - Refer to lung cancer screening clinic with Dustin Folks, NP 07/15/2019

## 2019-07-15 NOTE — Assessment & Plan Note (Addendum)
-   Moderate compliance - Pressure 5-10cm H20; AHI 0.6 - Reinforce importance of wearing CPAP every night for minimum of 4-6 hours  - Needs new head gear  - FU in 6 months

## 2019-07-15 NOTE — Assessment & Plan Note (Signed)
-   Smokes 1 ppd (35 pack year hx) - She is interested in quitting, counseling provided  - Refer to lung cancer screening clinic with Eric Form

## 2019-08-03 NOTE — Progress Notes (Signed)
Reviewed and agree with assessment/plan.   Kenson Groh, MD [facility] 12/06/2016, 12:24 PM Pager:  [phone]  

## 2019-10-09 ENCOUNTER — Telehealth: Payer: 59 | Admitting: Family Medicine

## 2019-10-09 ENCOUNTER — Other Ambulatory Visit: Payer: Self-pay

## 2019-10-09 ENCOUNTER — Encounter: Payer: Self-pay | Admitting: Family Medicine

## 2019-10-09 DIAGNOSIS — Z538 Procedure and treatment not carried out for other reasons: Secondary | ICD-10-CM

## 2019-10-09 NOTE — Progress Notes (Signed)
Called in for a virtual visit. However, she is located in [place] today so can not complete this virtual visit. She has opted to go to an urgent care today instead. Did let her know I would not put in a charge and will cancel.

## 2019-10-13 ENCOUNTER — Encounter: Payer: Self-pay | Admitting: Family Medicine

## 2019-10-14 NOTE — Telephone Encounter (Signed)
Can we put her at 3pm and then block other virtual slot please at 3:15?

## 2019-10-15 ENCOUNTER — Telehealth: Payer: 59 | Admitting: Family Medicine

## 2019-10-15 ENCOUNTER — Other Ambulatory Visit: Payer: Self-pay

## 2019-10-15 ENCOUNTER — Encounter: Payer: Self-pay | Admitting: Family Medicine

## 2019-10-15 VITALS — BP 128/76 | HR 88 | Temp 98.0°F | Ht 65.0 in | Wt 228.9 lb

## 2019-10-15 DIAGNOSIS — R748 Abnormal levels of other serum enzymes: Secondary | ICD-10-CM | POA: Diagnosis not present

## 2019-10-15 DIAGNOSIS — M542 Cervicalgia: Secondary | ICD-10-CM | POA: Diagnosis not present

## 2019-10-15 DIAGNOSIS — D72829 Elevated white blood cell count, unspecified: Secondary | ICD-10-CM

## 2019-10-15 DIAGNOSIS — R7982 Elevated C-reactive protein (CRP): Secondary | ICD-10-CM | POA: Diagnosis not present

## 2019-10-15 DIAGNOSIS — D72820 Lymphocytosis (symptomatic): Secondary | ICD-10-CM

## 2019-10-15 MED ORDER — DIAZEPAM 5 MG PO TABS: 5.0000 mg | ORAL_TABLET | Freq: Two times a day (BID) | ORAL | 0 refills | Status: DC | PRN

## 2019-10-15 MED ORDER — PREDNISONE 20 MG PO TABS: ORAL_TABLET | ORAL | 0 refills | Status: DC

## 2019-10-15 NOTE — Telephone Encounter (Signed)
I spoke with the pt and scheduled an appt for today to arrive at 2:45pm.
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
## 2019-10-15 NOTE — Patient Instructions (Signed)
Consider tiger balm to help rub on neck. Ice, massage back of head.   Take prednisone in morning with food. Gentle stretches.   thermacare wraps are nice heat sources if you need something on the go.

## 2019-10-16 ENCOUNTER — Other Ambulatory Visit (INDEPENDENT_AMBULATORY_CARE_PROVIDER_SITE_OTHER): Payer: 59

## 2019-10-16 DIAGNOSIS — R748 Abnormal levels of other serum enzymes: Secondary | ICD-10-CM | POA: Diagnosis not present

## 2019-10-16 LAB — CBC WITH DIFFERENTIAL/PLATELET
Basophils Absolute: 0.3 10*3/uL — ABNORMAL HIGH (ref 0.0–0.1)
Basophils Relative: 3 % (ref 0.0–3.0)
Eosinophils Absolute: 0.2 10*3/uL (ref 0.0–0.7)
Eosinophils Relative: 2 % (ref 0.0–5.0)
HCT: 44 % (ref 36.0–46.0)
Hemoglobin: 14.6 g/dL (ref 12.0–15.0)
Lymphocytes Relative: 46 % (ref 12.0–46.0)
Lymphs Abs: 5.1 10*3/uL — ABNORMAL HIGH (ref 0.7–4.0)
MCHC: 33.1 g/dL (ref 30.0–36.0)
MCV: 95.9 fl (ref 78.0–100.0)
Monocytes Absolute: 0.6 10*3/uL (ref 0.1–1.0)
Monocytes Relative: 5.8 % (ref 3.0–12.0)
Neutro Abs: 4.8 10*3/uL (ref 1.4–7.7)
Neutrophils Relative %: 43.2 % (ref 43.0–77.0)
Platelets: 213 10*3/uL (ref 150.0–400.0)
RBC: 4.58 Mil/uL (ref 3.87–5.11)
RDW: 14 % (ref 11.5–15.5)
WBC: 11 10*3/uL — ABNORMAL HIGH (ref 4.0–10.5)

## 2019-10-16 LAB — COMPREHENSIVE METABOLIC PANEL
ALT: 67 U/L — ABNORMAL HIGH (ref 0–35)
AST: 53 U/L — ABNORMAL HIGH (ref 0–37)
Albumin: 4.7 g/dL (ref 3.5–5.2)
Alkaline Phosphatase: 111 U/L (ref 39–117)
BUN: 13 mg/dL (ref 6–23)
CO2: 29 mEq/L (ref 19–32)
Calcium: 10 mg/dL (ref 8.4–10.5)
Chloride: 103 mEq/L (ref 96–112)
Creatinine, Ser: 0.77 mg/dL (ref 0.40–1.20)
GFR: 77.92 mL/min (ref >60.00)
Glucose, Bld: 90 mg/dL (ref 70–99)
Potassium: 4.2 mEq/L (ref 3.5–5.1)
Sodium: 140 mEq/L (ref 135–145)
Total Bilirubin: 0.4 mg/dL (ref 0.2–1.2)
Total Protein: 7.5 g/dL (ref 6.0–8.3)

## 2019-10-16 LAB — GAMMA GT: GGT: 95 U/L — ABNORMAL HIGH (ref 7–51)

## 2019-10-16 LAB — C-REACTIVE PROTEIN: CRP: 1.1 mg/dL (ref 0.5–20.0)

## 2019-10-20 ENCOUNTER — Telehealth: Payer: Self-pay | Admitting: Hematology and Oncology

## 2019-10-20 ENCOUNTER — Telehealth: Payer: Self-pay

## 2019-10-20 NOTE — Telephone Encounter (Signed)
Received a new hem referral from Dr. Ethlyn Gallery for leukocytosis and elevated lymphocytes. [name] has been scheduled to see Dr. Lorenso Courier on 11/16 at 1pm. Pt aware to arrive 15 minutes early.

## 2019-10-20 NOTE — Telephone Encounter (Signed)
Patient scheduled.

## 2019-10-20 NOTE — Telephone Encounter (Signed)
Left message for patient to call back to schedule visit with one of the sports med providers for neck pain.

## 2019-10-22 ENCOUNTER — Telehealth: Payer: Self-pay | Admitting: Gastroenterology

## 2019-10-22 ENCOUNTER — Ambulatory Visit: Payer: 59 | Admitting: Family Medicine

## 2019-10-22 ENCOUNTER — Encounter: Payer: Self-pay | Admitting: Family Medicine

## 2019-10-22 ENCOUNTER — Ambulatory Visit (INDEPENDENT_AMBULATORY_CARE_PROVIDER_SITE_OTHER): Payer: 59 | Admitting: Family Medicine

## 2019-10-22 ENCOUNTER — Other Ambulatory Visit: Payer: Self-pay

## 2019-10-22 VITALS — BP 130/78 | Ht 65.0 in | Wt 226.2 lb

## 2019-10-22 VITALS — BP 120/68 | HR 91 | Temp 98.0°F | Ht 65.0 in | Wt 227.7 lb

## 2019-10-22 DIAGNOSIS — M25512 Pain in left shoulder: Secondary | ICD-10-CM | POA: Diagnosis not present

## 2019-10-22 DIAGNOSIS — M542 Cervicalgia: Secondary | ICD-10-CM | POA: Diagnosis not present

## 2019-10-22 DIAGNOSIS — M79604 Pain in right leg: Secondary | ICD-10-CM | POA: Diagnosis not present

## 2019-10-22 DIAGNOSIS — M545 Low back pain, unspecified: Secondary | ICD-10-CM

## 2019-10-22 DIAGNOSIS — R748 Abnormal levels of other serum enzymes: Secondary | ICD-10-CM | POA: Diagnosis not present

## 2019-10-22 DIAGNOSIS — D72829 Elevated white blood cell count, unspecified: Secondary | ICD-10-CM

## 2019-10-22 DIAGNOSIS — F4321 Adjustment disorder with depressed mood: Secondary | ICD-10-CM

## 2019-10-22 MED ORDER — BACLOFEN 10 MG PO TABS: 10.0000 mg | ORAL_TABLET | Freq: Three times a day (TID) | ORAL | 0 refills | Status: DC

## 2019-10-22 NOTE — Telephone Encounter (Signed)
Pt returned your call.  

## 2019-10-22 NOTE — Patient Instructions (Addendum)
Thank you for coming in today. You should be able to schedule PT now.  Try the other muscle relaxer baclofen.  Continue pain medicines.  Recheck in 4 weeks or sooner If needed.   Happy to complete any forms if needed.
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
## 2019-10-22 NOTE — Patient Instructions (Addendum)
Set up GI followup with Dr. Tarri Glenn.  1 month update in mychart: let me know how neck, muscles, mood are doing.
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
## 2019-10-22 NOTE — Progress Notes (Signed)
OV note faxed from 10/22/19 DOS.

## 2019-10-22 NOTE — Telephone Encounter (Signed)
Left message for the patient to call back.

## 2019-10-22 NOTE — Telephone Encounter (Signed)
Patient is following up about the endoscopy she had back in May- patient stated that Dr. Tarri Glenn said something about a barium swallow test and she had not heard anything back.

## 2019-10-23 NOTE — Telephone Encounter (Signed)
Thanks for the follow-up.  I am glad that she is feeling better on the Zegerid.  If symptoms have improved the upper GI series is not needed.  I look forward to seeing her in December to review her laboratory results.  Thank you.

## 2019-10-23 NOTE — Telephone Encounter (Signed)
Spoke to patient who reports since May (endo colon) she had randomly had heartburn every couple of week and has successfully used Zegrid for relief. She went to her PCP who wanted her to f/u due to elevated liver labs. Please review. F/U appt scheduled on 12/15 at 9:50 am. The patient wanted to f/u concerning upper GI series. Please advise if you would like the patient to still have the upper GI series?

## 2019-10-23 NOTE — Telephone Encounter (Signed)
Notified the patient of Dr. Tarri Glenn new recommendations. Patient looking forward to see Dr. Tarri Glenn on 12/15 at 9:50 am.

## 2019-10-27 ENCOUNTER — Inpatient Hospital Stay: Payer: 59 | Attending: Hematology and Oncology | Admitting: Hematology and Oncology

## 2019-10-27 ENCOUNTER — Other Ambulatory Visit: Payer: Self-pay | Admitting: *Deleted

## 2019-10-27 ENCOUNTER — Inpatient Hospital Stay: Payer: 59

## 2019-10-27 ENCOUNTER — Other Ambulatory Visit: Payer: Self-pay

## 2019-10-27 VITALS — BP 152/93 | HR 96 | Temp 98.2°F | Resp 18 | Ht 65.0 in | Wt 228.7 lb

## 2019-10-27 DIAGNOSIS — D72823 Leukemoid reaction: Secondary | ICD-10-CM

## 2019-10-27 DIAGNOSIS — Z8249 Family history of ischemic heart disease and other diseases of the circulatory system: Secondary | ICD-10-CM | POA: Insufficient documentation

## 2019-10-27 DIAGNOSIS — R7989 Other specified abnormal findings of blood chemistry: Secondary | ICD-10-CM

## 2019-10-27 DIAGNOSIS — Z79899 Other long term (current) drug therapy: Secondary | ICD-10-CM | POA: Insufficient documentation

## 2019-10-27 DIAGNOSIS — K219 Gastro-esophageal reflux disease without esophagitis: Secondary | ICD-10-CM | POA: Diagnosis not present

## 2019-10-27 DIAGNOSIS — D72829 Elevated white blood cell count, unspecified: Secondary | ICD-10-CM | POA: Insufficient documentation

## 2019-10-27 DIAGNOSIS — Z23 Encounter for immunization: Secondary | ICD-10-CM

## 2019-10-27 DIAGNOSIS — D751 Secondary polycythemia: Secondary | ICD-10-CM | POA: Insufficient documentation

## 2019-10-27 DIAGNOSIS — Z803 Family history of malignant neoplasm of breast: Secondary | ICD-10-CM | POA: Diagnosis not present

## 2019-10-27 DIAGNOSIS — M1990 Unspecified osteoarthritis, unspecified site: Secondary | ICD-10-CM | POA: Diagnosis not present

## 2019-10-27 DIAGNOSIS — F17210 Nicotine dependence, cigarettes, uncomplicated: Secondary | ICD-10-CM | POA: Insufficient documentation

## 2019-10-27 LAB — CMP (CANCER CENTER ONLY)
ALT: 59 U/L — ABNORMAL HIGH (ref 0–44)
AST: 39 U/L (ref 15–41)
Albumin: 4.4 g/dL (ref 3.5–5.0)
Alkaline Phosphatase: 136 U/L — ABNORMAL HIGH (ref 38–126)
Anion gap: 14 (ref 5–15)
BUN: 14 mg/dL (ref 6–20)
CO2: 24 mmol/L (ref 22–32)
Calcium: 9.5 mg/dL (ref 8.9–10.3)
Chloride: 101 mmol/L (ref 98–111)
Creatinine: 0.82 mg/dL (ref 0.44–1.00)
GFR, Est AFR Am: >60 mL/min (ref >60)
GFR, Estimated: >60 mL/min (ref >60)
Glucose, Bld: 85 mg/dL (ref 70–99)
Potassium: 3.6 mmol/L (ref 3.5–5.1)
Sodium: 139 mmol/L (ref 135–145)
Total Bilirubin: 0.3 mg/dL (ref 0.3–1.2)
Total Protein: 7.7 g/dL (ref 6.5–8.1)

## 2019-10-27 LAB — CBC WITH DIFFERENTIAL (CANCER CENTER ONLY)
Abs Immature Granulocytes: 0.03 10*3/uL (ref 0.00–0.07)
Basophils Absolute: 0.1 10*3/uL (ref 0.0–0.1)
Basophils Relative: 1 %
Eosinophils Absolute: 0.2 10*3/uL (ref 0.0–0.5)
Eosinophils Relative: 1 %
HCT: 45.6 % (ref 36.0–46.0)
Hemoglobin: 15.2 g/dL — ABNORMAL HIGH (ref 12.0–15.0)
Immature Granulocytes: 0 %
Lymphocytes Relative: 48 %
Lymphs Abs: 6.5 10*3/uL — ABNORMAL HIGH (ref 0.7–4.0)
MCH: 31.9 pg (ref 26.0–34.0)
MCHC: 33.3 g/dL (ref 30.0–36.0)
MCV: 95.6 fL (ref 80.0–100.0)
Monocytes Absolute: 0.7 10*3/uL (ref 0.1–1.0)
Monocytes Relative: 5 %
Neutro Abs: 6.2 10*3/uL (ref 1.7–7.7)
Neutrophils Relative %: 45 %
Platelet Count: 235 10*3/uL (ref 150–400)
RBC: 4.77 MIL/uL (ref 3.87–5.11)
RDW: 13.8 % (ref 11.5–15.5)
WBC Count: 13.6 10*3/uL — ABNORMAL HIGH (ref 4.0–10.5)
nRBC: 0 % (ref 0.0–0.2)

## 2019-10-27 LAB — SAVE SMEAR(SSMR), FOR PROVIDER SLIDE REVIEW

## 2019-10-27 LAB — HEMOGLOBIN A1C
Hgb A1c MFr Bld: 6.6 % — ABNORMAL HIGH (ref 4.8–5.6)
Mean Plasma Glucose: 142.72 mg/dL

## 2019-10-27 LAB — IRON AND TIBC
Iron: 62 ug/dL (ref 41–142)
Saturation Ratios: 21 % (ref 21–57)
TIBC: 296 ug/dL (ref 236–444)
UIBC: 235 ug/dL (ref 120–384)

## 2019-10-27 LAB — FERRITIN: Ferritin: 166 ng/mL (ref 11–307)

## 2019-10-27 LAB — SEDIMENTATION RATE: Sed Rate: 4 mm/hr (ref 0–22)

## 2019-10-27 LAB — C-REACTIVE PROTEIN: CRP: 1.7 mg/dL — ABNORMAL HIGH (ref <1.0)

## 2019-10-27 LAB — LACTATE DEHYDROGENASE: LDH: 133 U/L (ref 98–192)

## 2019-10-27 MED ORDER — INFLUENZA VAC SPLIT QUAD 0.5 ML IM SUSY: 0.5000 mL | PREFILLED_SYRINGE | Freq: Once | INTRAMUSCULAR | Status: DC

## 2019-10-28 ENCOUNTER — Ambulatory Visit (INDEPENDENT_AMBULATORY_CARE_PROVIDER_SITE_OTHER): Payer: 59 | Admitting: Physical Therapy

## 2019-10-28 ENCOUNTER — Telehealth: Payer: Self-pay | Admitting: Hematology and Oncology

## 2019-10-28 DIAGNOSIS — M542 Cervicalgia: Secondary | ICD-10-CM

## 2019-10-28 DIAGNOSIS — M545 Low back pain, unspecified: Secondary | ICD-10-CM

## 2019-10-28 DIAGNOSIS — M25512 Pain in left shoulder: Secondary | ICD-10-CM

## 2019-10-28 LAB — TSH: TSH: 2.302 u[IU]/mL (ref 0.308–3.960)

## 2019-10-28 NOTE — Telephone Encounter (Signed)
No los per 11/16. °

## 2019-10-29 ENCOUNTER — Other Ambulatory Visit: Payer: Self-pay

## 2019-10-29 ENCOUNTER — Encounter: Payer: Self-pay | Admitting: Family Medicine

## 2019-10-29 ENCOUNTER — Ambulatory Visit (INDEPENDENT_AMBULATORY_CARE_PROVIDER_SITE_OTHER): Payer: 59 | Admitting: Physical Therapy

## 2019-10-29 DIAGNOSIS — M542 Cervicalgia: Secondary | ICD-10-CM

## 2019-10-29 DIAGNOSIS — M545 Low back pain, unspecified: Secondary | ICD-10-CM

## 2019-10-29 DIAGNOSIS — M25512 Pain in left shoulder: Secondary | ICD-10-CM | POA: Diagnosis not present

## 2019-10-30 ENCOUNTER — Telehealth: Payer: Self-pay | Admitting: Family Medicine

## 2019-10-30 ENCOUNTER — Encounter: Payer: Self-pay | Admitting: Family Medicine
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
## 2019-10-30 NOTE — Telephone Encounter (Signed)
Forms placed on your desk.

## 2019-10-30 NOTE — Telephone Encounter (Signed)
[employer] faxed a disability form  Fax forms to [phone]  Disposition: Dr's folder

## 2019-10-31 ENCOUNTER — Encounter: Payer: Self-pay | Admitting: Physical Therapy

## 2019-10-31 ENCOUNTER — Other Ambulatory Visit: Payer: Self-pay
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
## 2019-10-31 NOTE — Patient Instructions (Signed)
Access Code: [email or web address]  URL: [email or web address]  Date: 10/29/2019  Prepared by: Lyndee Hensen   Exercises Seated Cervical Sidebending Stretch - 3 reps - 30 hold - 2x daily Standing Backward Shoulder Rolls - 10 reps - 1 sets - 2x daily Seated Scapular Retraction - 10 reps - 1 sets - 2x daily Seated Cervical Retraction - 10 reps - 1 sets - 2x daily Supine Posterior Pelvic Tilt - 10 reps - 2 sets - 2x daily Single Knee to Chest Stretch - 3 reps - 30 hold - 2x daily Supine Figure 4 Piriformis Stretch with Leg Extension - 3 reps - 30 hold - 2x daily Supine Transversus Abdominis Bracing - Hands on Ground - 10 reps - 5 hold - 2x daily

## 2019-11-03 ENCOUNTER — Ambulatory Visit (INDEPENDENT_AMBULATORY_CARE_PROVIDER_SITE_OTHER): Payer: 59 | Admitting: Physical Therapy

## 2019-11-03 ENCOUNTER — Other Ambulatory Visit: Payer: Self-pay

## 2019-11-03 DIAGNOSIS — M545 Low back pain, unspecified: Secondary | ICD-10-CM

## 2019-11-03 DIAGNOSIS — M542 Cervicalgia: Secondary | ICD-10-CM | POA: Diagnosis not present

## 2019-11-03 DIAGNOSIS — M25512 Pain in left shoulder: Secondary | ICD-10-CM | POA: Diagnosis not present

## 2019-11-03 NOTE — Telephone Encounter (Signed)
Form completed and notes from requested days were printed as well. Looks like sports med also wrote her out of work and completed paperwork for her.

## 2019-11-03 NOTE — Telephone Encounter (Signed)
Left a detailed message at the pts cell number with the message below and forms were faxed to [employer] at [phone] and sent to be scanned.

## 2019-11-04 ENCOUNTER — Encounter: Payer: Self-pay | Admitting: Physical Therapy

## 2019-11-04 ENCOUNTER — Ambulatory Visit (INDEPENDENT_AMBULATORY_CARE_PROVIDER_SITE_OTHER): Payer: 59 | Admitting: Physical Therapy

## 2019-11-04 DIAGNOSIS — M545 Low back pain, unspecified: Secondary | ICD-10-CM

## 2019-11-04 DIAGNOSIS — M542 Cervicalgia: Secondary | ICD-10-CM

## 2019-11-04 DIAGNOSIS — M25512 Pain in left shoulder: Secondary | ICD-10-CM | POA: Diagnosis not present

## 2019-11-11 ENCOUNTER — Other Ambulatory Visit: Payer: Self-pay

## 2019-11-11 ENCOUNTER — Encounter: Payer: Self-pay | Admitting: Physical Therapy

## 2019-11-11 ENCOUNTER — Ambulatory Visit (INDEPENDENT_AMBULATORY_CARE_PROVIDER_SITE_OTHER): Payer: 59 | Admitting: Physical Therapy

## 2019-11-11 DIAGNOSIS — M25512 Pain in left shoulder: Secondary | ICD-10-CM | POA: Diagnosis not present

## 2019-11-11 DIAGNOSIS — M545 Low back pain, unspecified: Secondary | ICD-10-CM

## 2019-11-11 DIAGNOSIS — M542 Cervicalgia: Secondary | ICD-10-CM | POA: Diagnosis not present
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
## 2019-11-12 ENCOUNTER — Ambulatory Visit (INDEPENDENT_AMBULATORY_CARE_PROVIDER_SITE_OTHER): Payer: 59 | Admitting: Physical Therapy

## 2019-11-12 ENCOUNTER — Encounter: Payer: Self-pay | Admitting: Physical Therapy

## 2019-11-12 DIAGNOSIS — M542 Cervicalgia: Secondary | ICD-10-CM

## 2019-11-12 DIAGNOSIS — M545 Low back pain, unspecified: Secondary | ICD-10-CM

## 2019-11-12 DIAGNOSIS — M25512 Pain in left shoulder: Secondary | ICD-10-CM
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
## 2019-11-17 ENCOUNTER — Other Ambulatory Visit: Payer: Self-pay | Admitting: Family Medicine

## 2019-11-18 ENCOUNTER — Ambulatory Visit (INDEPENDENT_AMBULATORY_CARE_PROVIDER_SITE_OTHER): Payer: 59 | Admitting: Physical Therapy

## 2019-11-18 ENCOUNTER — Encounter: Payer: Self-pay | Admitting: Physical Therapy

## 2019-11-18 DIAGNOSIS — M545 Low back pain, unspecified: Secondary | ICD-10-CM

## 2019-11-18 DIAGNOSIS — M25512 Pain in left shoulder: Secondary | ICD-10-CM

## 2019-11-18 DIAGNOSIS — M542 Cervicalgia: Secondary | ICD-10-CM
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
## 2019-11-19 ENCOUNTER — Ambulatory Visit (INDEPENDENT_AMBULATORY_CARE_PROVIDER_SITE_OTHER): Payer: 59 | Admitting: Physical Therapy

## 2019-11-19 ENCOUNTER — Ambulatory Visit: Payer: Self-pay

## 2019-11-19 ENCOUNTER — Encounter: Payer: Self-pay | Admitting: Physical Therapy

## 2019-11-19 ENCOUNTER — Ambulatory Visit (INDEPENDENT_AMBULATORY_CARE_PROVIDER_SITE_OTHER): Payer: 59 | Admitting: Family Medicine

## 2019-11-19 VITALS — BP 160/100 | HR 90 | Ht 65.0 in | Wt 227.6 lb

## 2019-11-19 DIAGNOSIS — M25562 Pain in left knee: Secondary | ICD-10-CM

## 2019-11-19 DIAGNOSIS — M25512 Pain in left shoulder: Secondary | ICD-10-CM

## 2019-11-19 DIAGNOSIS — M545 Low back pain, unspecified: Secondary | ICD-10-CM

## 2019-11-19 DIAGNOSIS — M542 Cervicalgia: Secondary | ICD-10-CM

## 2019-11-19 DIAGNOSIS — M25561 Pain in right knee: Secondary | ICD-10-CM

## 2019-11-19 NOTE — Patient Instructions (Addendum)
Thank you for coming in today. Please continue PT especially for the neck and shoulder/back.  Injection/nerve burning will likely help the low back.  I can do a cortisone shot to the knees if needed if not getting better.   Call or go to the ER if you develop a large red swollen joint with extreme pain or oozing puss.    Recheck in about 1 month.    We're moving!  Dr. Clovis Riley new office will be located at [address] on the 1st floor.  This location is across the street from the [employer] and in the same complex as the [facility] and [employer]  Our new office phone number will be [phone].  We anticipate beginning to see patients at the [facility] office in early December 2020.
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
## 2019-11-23 ENCOUNTER — Encounter: Payer: Self-pay | Admitting: Physical Therapy
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
## 2019-11-25 ENCOUNTER — Ambulatory Visit: Payer: 59 | Admitting: Gastroenterology

## 2019-11-26 ENCOUNTER — Encounter: Payer: 59 | Admitting: Physical Therapy

## 2019-11-26 ENCOUNTER — Other Ambulatory Visit: Payer: Self-pay

## 2019-11-26 ENCOUNTER — Telehealth: Payer: Self-pay | Admitting: *Deleted

## 2019-11-26 NOTE — Telephone Encounter (Signed)
Dr Ethlyn Gallery received disability forms via fax from [place] to be completed.  Dr Ethlyn Gallery stated she has not seen the pt recently and if she is still out for problems related to sports medicine, the forms should go to their office.  She stated if the forms are related to her mood, a virtual visit can be scheduled.  I left a message for the pt to return a call to the office and a CRM was also created.

## 2019-11-27 ENCOUNTER — Ambulatory Visit (INDEPENDENT_AMBULATORY_CARE_PROVIDER_SITE_OTHER): Payer: 59 | Admitting: Physical Therapy

## 2019-11-27 ENCOUNTER — Encounter: Payer: 59 | Admitting: Physical Therapy

## 2019-11-27 ENCOUNTER — Telehealth: Payer: Self-pay | Admitting: Family Medicine

## 2019-11-27 ENCOUNTER — Encounter: Payer: Self-pay | Admitting: Physical Therapy

## 2019-11-27 DIAGNOSIS — M545 Low back pain, unspecified: Secondary | ICD-10-CM

## 2019-11-27 DIAGNOSIS — M542 Cervicalgia: Secondary | ICD-10-CM

## 2019-11-27 DIAGNOSIS — M25512 Pain in left shoulder: Secondary | ICD-10-CM

## 2019-11-27 NOTE — Telephone Encounter (Signed)
Sharyn Lull Calling from [facility] requesting OV notes for 11/19/19 visit was Dr. Sherene Sires for Neck, back, Knees for short term disability claim with [employer] and [employer]. [id number] [phone]

## 2019-11-27 NOTE — Telephone Encounter (Signed)
See note
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
## 2019-11-27 NOTE — Telephone Encounter (Signed)
Forms faxed to [place] at [place].

## 2019-12-02 ENCOUNTER — Encounter: Payer: 59 | Admitting: Physical Therapy

## 2019-12-03 NOTE — Telephone Encounter (Signed)
See note

## 2019-12-03 NOTE — Telephone Encounter (Signed)
Lori [name] states that the fax did not include work status which was requested.   Fax# [phone]

## 2019-12-09 ENCOUNTER — Encounter: Payer: 59 | Admitting: Physical Therapy

## 2019-12-09 ENCOUNTER — Encounter: Payer: Self-pay | Admitting: Family Medicine

## 2019-12-09 NOTE — Progress Notes (Signed)
Talked with the patient to clarify work status per request from [name]. Patient remains out of work.  We will reassess at scheduled visit on December 22, 2019

## 2019-12-09 NOTE — Telephone Encounter (Signed)
Dr. [name] notified regarding missing work status and he drafted a letter addressing pt's current status.  Letter faxed to [place] at [place] [phone]).

## 2019-12-10 ENCOUNTER — Ambulatory Visit: Payer: 59 | Admitting: Gastroenterology

## 2019-12-10 ENCOUNTER — Other Ambulatory Visit: Payer: 59

## 2019-12-10 ENCOUNTER — Encounter: Payer: Self-pay | Admitting: Gastroenterology

## 2019-12-10 VITALS — BP 124/80 | HR 104 | Temp 98.1°F | Ht 64.0 in | Wt 231.0 lb

## 2019-12-10 DIAGNOSIS — R748 Abnormal levels of other serum enzymes: Secondary | ICD-10-CM | POA: Diagnosis not present

## 2019-12-10 DIAGNOSIS — K219 Gastro-esophageal reflux disease without esophagitis: Secondary | ICD-10-CM

## 2019-12-10 NOTE — Patient Instructions (Signed)
If you are age 54 or older, your body mass index should be between 23-30. Your Body mass index is 39.65 kg/m. If this is out of the aforementioned range listed, please consider follow up with your Primary Care Provider.  If you are age 40 or younger, your body mass index should be between 19-25. Your Body mass index is 39.65 kg/m. If this is out of the aformentioned range listed, please consider follow up with your Primary Care Provider.   Due to recent changes in healthcare laws, you may see the results of your imaging and laboratory studies on MyChart before your provider has had a chance to review them.  We understand that in some cases there may be results that are confusing or concerning to you. Not all laboratory results come back in the same time frame and the provider may be waiting for multiple results in order to interpret others.  Please give [name] 48 hours in order for your provider to thoroughly review all the results before contacting the office for clarification of your results.   Your provider has requested that you go to the basement level for lab work before leaving today. Press "B" on the elevator. The lab is located at the first door on the left as you exit the elevator.  You have been scheduled for an Upper GI Series at [facility]. Your appointment is on 12/11/2019 at 11:00am. Please arrive 15 minutes prior to your test for registration. Make sure not to eat or drink anything after midnight on the night before your test. If you need to reschedule, please call radiology at [phone]. ________________________________________________________________ An upper GI series uses x rays to help diagnose problems of the upper GI tract, which includes the esophagus, stomach, and duodenum. The duodenum is the first part of the small intestine. An upper GI series is conducted by a radiology technologist or a radiologist--a doctor who specializes in x-ray imaging--at a hospital or outpatient  center. While sitting or standing in front of an x-ray machine, the patient drinks barium liquid, which is often white and has a chalky consistency and taste. The barium liquid coats the lining of the upper GI tract and makes signs of disease show up more clearly on x rays. X-ray video, called fluoroscopy, is used to view the barium liquid moving through the esophagus, stomach, and duodenum. Additional x rays and fluoroscopy are performed while the patient lies on an x-ray table. To fully coat the upper GI tract with barium liquid, the technologist or radiologist may press on the abdomen or ask the patient to change position. Patients hold still in various positions, allowing the technologist or radiologist to take x rays of the upper GI tract at different angles. If a technologist conducts the upper GI series, a radiologist will later examine the images to look for problems.  This test typically takes about 1 hour to complete. __________________________________________________________  Lori [name] have been scheduled for an abdominal ultrasound at [facility] (1st floor of hospital) on 12/17/19 at 9:30am. Please arrive 15 minutes prior to your appointment for registration. Make certain not to have anything to eat or drink 6 hours prior to your appointment. Should you need to reschedule your appointment, please contact radiology at [phone]. This test typically takes about 30 minutes to perform.  Metamucil, 1 tablespoon once or twice daily can be used to keep bowels regular if needed.

## 2019-12-10 NOTE — Progress Notes (Signed)
Referring Provider: Caren Macadam, MD Primary Care Physician:  Lori Macadam, MD   Consultation: Abnormal liver enzymes   IMPRESSION:  Abdominal transaminases    - no known diabetes or high cholesterol    - TSH normal at 2.320 10/27/19 BMI 40 Unintentional 30 pound weight gain over the last couple of years Recurrent GERD following fundoplication 1856 Diarrhea/ Change in bowel habits Abnormal perianal exam: small, firm, pearly, multilobed lesion located over one 1cm from the dentate line.  She was referred for elevated transaminases. Suspected hepatocellular process. Will proceed with labs to evaluate for common causes of hepatocellular injury including: viral hepatitis serologies, iron, ferritin, fasting insulin, fasting glucose,and autoimmune type I (ANA, AMA, ASMA, IgG). If these labs are non-diagnostic, will proceed with alpha-1-antitrypsin, and celiac serologies.  Needs abdominal imaging to evaluate hepatic parenchyma.  We will proceed with ultrasound although cross-sectional imaging may ultimately be needed given the decreased sensitivity and specificity of ultrasound in the setting of a BMI of 40.  If labs suggest fatty liver will proceed with Lori [name].   Evaluation for diarrhea was negative.  Fecal calprotectin, ESR, and CRP were normal. Giardia testing was negative. EGD with duodenal biopsies were negative for celiac. Colonoscopy with random biopsies was normal.  Thankfully, her diarrhea has improved.  Would use a stool bulking agent as needed in the future.  Recurrent GERD with breakthrough symptoms despite medical therapy.   EGD showed LA class a reflux esophagitis, gastritis, and unwrapped fundoplication.  We will proceed with upper GI series for further evaluation of her anatomy.  Consider additional evaluation if not responding to medical therapy.  She had a small perianal lesion noted at the time of recent colonoscopy.  She saw Dr. Dema [name] in consultation.   Apparently he provided reassurance and told her no treatment or follow-up was indicated.  Will obtain a copy of his consultation note.    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 Dema [name] - Screening colonoscopy due 2023   HPI: Lori [name] is a 53 y.o. who works at [employer] who has previously been under evaluation for diarrhea. Rereferred by Dr. Idelle [name] for abnormal liver enzymes. Interval history is obtained through the patient and review of her electronic health record. She has chronic low back pain, obstructive sleep apnea, TMJ, tobacco habituation, vitamin D deficiency. She has no known history of diabetes, prediabetes, or hypercholesterolemia. Her BMI is 40.   Was seen in 02/11/19 for diarrhea with associated non-radiating epigastric abdominal pain, nausea and bloating  that started after taking Vitamin D. Having between 4 and 6 bowel movements daily.  No blood or mucus. Symptoms improved after stopping vitamin D and reduced dairy.  Uses Imodium PRN.   He has a longstanding history of reflux.  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 Treated with Prilosec for 10 years. Then Zegerid. Felt like thumb at sternal notch after surgery. ENT formed laryngoscopy said reflux. She then saw Dr. Deatra [name].  A Nissen fundoplication resulted in significant improvement.  However, continues to have chronic hoarseness and coughing, especially if she discontinues her medicines.  No dysphasia or odynophagia.  Returns now after she was found to have elevated liver enzymes.  Tylenol  removed from pain medications and the medications are no longer as effective. No prior blood donation.  No prior blood transfusion.  No history  of jaundice or scleral icterus.  No history of use or experimentation with IV or intranasal street drugs.  No history of autoimmune disease.  No family history of liver disease although father was an alcoholic.  She has gained 30 pounds over the last year.   Has some upper abdominal cramping. "Catches" if she bends over to tie her shoes.  Diarrhea has improved. She does not feel that further work-up is needed at this time.   Fundoplication and hernia repair 2011.   Endoscopic history: - EGD 07/26/10 for GERD: duodenal bulb erosions. Otherwise normal exam. Bravo pH monitoring and manometry were performed.  - Distant colonoscopy, although I am unable to locate these results in EPIC.  - Colonoscopy 04/22/2019 showed a small firm pearly multilobe lesion located 1 cm from the dentate line.  The colon mucosa was normal.  Normal random colon biopsies excluded microscopic colitis. - EGD 04/22/2019 showed LA class a reflux esophagitis, a loose fundoplication wrap, gastritis, and was otherwise normal.  Duodenal biopsies showed mild focal nonspecific inflammation.  Gastric biopsy showed chronic active gastritis without evidence for H. pylori.  Esophageal biopsies confirmed reflux.  There was no eosinophilic esophagitis.  Labs 01/20/19: HCV Ab negative Stool test 02/12/2019 showed a fecal calprotectin of 26.  GI stool pathogen panel was negative. Labs 07/25/19: TB 0.5, AST 77, ALT 66, alk phos 93 Labs 10/15/19: TB 0.4, AST 53, ALT 67, alk phos 111, alb 4.7 Labs 10/27/2019 showed a normal TSH at 2.30, sed rate 4, CRP 1.7, TB 0.3, AST 39, ALT 59, alk phos 136, albumin 4.4, iron 62, ferritin 166, hemoglobin 15.2, MCV 95.6, RDW 13.8  Upper GI series was recommended but never performed.  CT abd/pelvis with contrast 05/28/12: uterine fibroids, DDD L5-S1. No findings of advanced liver disease. No other abdominal imaging.   Past Medical History:  Diagnosis Date  . ALLERGIC RHINITIS 03/14/2010  . BACK PAIN 01/24/2010    . Buttock pain   . GERD 12/01/2008  . HIATAL HERNIA 05/27/2010  . 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 Numbness and tingling of left upper and lower extremity   . OSTEOARTHRITIS 12/01/2008  . Sleep apnea   . Thigh pain   . 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 TOBACCO USE 12/01/2008  . VITAMIN D DEFICIENCY 05/23/2010    Past Surgical History:  Procedure Laterality Date  . ABDOMINAL HYSTERECTOMY N/A 10/17/2016   Procedure: HYSTERECTOMY ABDOMINAL;  Surgeon: Sanjuana Kava, MD;  Location: [facility]; benign mass in uterus  . APPENDECTOMY    . BREAST SURGERY     lumpectomy  . CERVICAL FUSION     x2; 2007, 2011  . CESAREAN SECTION  1988  . COLONOSCOPY    . CYSTOSCOPY N/A 10/17/2016   Procedure: CYSTOSCOPY;  Surgeon: Sanjuana Kava, MD;  Location: [facility];  Service: Gynecology;  Laterality: N/A;  . DILATION AND CURETTAGE OF UTERUS     miscarriage  . ESOPHAGEAL MANOMETRY    . ESOPHAGOGASTRODUODENOSCOPY    . GASTRIC FUNDOPLICATION     Nissen  . LAPAROSCOPIC NISSEN FUNDOPLICATION    . LUMBAR DISC SURGERY    . LUMBAR LAMINECTOMY  2005  . SALPINGOOPHORECTOMY Bilateral 10/17/2016   Procedure: SALPINGO OOPHORECTOMY;  Surgeon: Sanjuana Kava, MD;  Location: [facility];  Service: Gynecology;  Laterality: Bilateral;  . TONSILLECTOMY AND ADENOIDECTOMY    . UPPER  GASTROINTESTINAL ENDOSCOPY      Current Outpatient Medications  Medication Sig Dispense Refill  . amitriptyline (ELAVIL) 10 MG tablet Take 20 mg by mouth daily.    . baclofen (LIORESAL) 10 MG tablet Take 1 tablet (10 mg total) by mouth 3 (three) times daily. 90 each 0  . Biotin w/ Vitamins C & E (HAIR/SKIN/NAILS PO) Take 1 tablet by mouth every morning.    . cetirizine (ZYRTEC) 10 MG tablet Take 10 mg by mouth at bedtime.    . Cholecalciferol (VITAMIN D3 PO) Take 1,000 Units by mouth daily.    . diazepam (VALIUM) 5 MG tablet Take 1 tablet (5 mg total) by mouth every 12 (twelve) hours as needed for anxiety or muscle spasms. 60 tablet 0  . fluticasone  (FLONASE) 50 MCG/ACT nasal spray Place 2 sprays into both nostrils daily.    . Multiple Vitamins-Minerals (EMERGEN-C IMMUNE PO) Take 1 tablet by mouth daily. With zinc    . Oxycodone HCl 10 MG TABS Take 10 mg by mouth 5 (five) times daily as needed.    . venlafaxine XR (EFFEXOR-XR) 150 MG 24 hr capsule Take 150 mg by mouth at bedtime.     Avis Epley SR 150 MG 12 hr tablet Take 150 mg by mouth 2 (two) times daily.    Marland Kitchen ibuprofen (ADVIL) 200 MG tablet Take 800 mg by mouth as needed.     Current Facility-Administered Medications  Medication Dose Route Frequency Provider Last Rate Last Admin  . 0.9 %  sodium chloride infusion  500 mL Intravenous Once [place] Park, MD        Allergies as of 12/10/2019 - Review Complete 12/10/2019  Allergen Reaction Noted  . Amoxicillin Nausea Only 10/14/2014  . Morphine Other (See Comments) 07/21/2010  . Thiethylperazine Other (See Comments) 09/11/2016  . Latex Rash 07/24/2018    Family History  Problem Relation Age of Onset  . Breast cancer Mother 78  . High blood pressure Mother   . High Cholesterol Mother   . Diabetes Mother   . Diverticulitis Mother 19  . Heart attack Mother 29       during hospitalization with diverticulitis  . Alzheimer's disease Father   . Alcohol abuse Father   . Colon polyps Half-Sister   . Colonic polyp Half-Brother   . Esophageal cancer Neg Hx   . Colon cancer Neg Hx   . Rectal cancer Neg Hx   . Stomach cancer Neg Hx     Social History   Socioeconomic History  . Marital status: Legally Separated    Spouse name: Not on file  . Number of children: 1  . Years of education: Not on file  . Highest education level: Not on file  Occupational History  . Occupation: [employer] user  Tobacco Use  . Smoking status: Current Every Day Smoker    Packs/day: 1.00    Years: 31.00    Pack years: 31.00    Types: Cigarettes  . Smokeless tobacco: Never Used  Substance and Sexual Activity  . Alcohol use: Yes     Comment: social  . Drug use: No  . Sexual activity: Not on file  Other Topics Concern  . Not on file  Social History Narrative  . Not on file   Social Determinants of Health   Financial Resource Strain:   . Difficulty of Paying Living Expenses: Not on file  Food Insecurity:   . Worried About Charity fundraiser in the Last  Year: Not on file  . Ran Out of Food in the Last Year: Not on file  Transportation Needs:   . Lack of Transportation (Medical): Not on file  . Lack of Transportation (Non-Medical): Not on file  Physical Activity:   . Days of Exercise per Week: Not on file  . Minutes of Exercise per Session: Not on file  Stress:   . Feeling of Stress : Not on file  Social Connections:   . Frequency of Communication with Friends and Family: Not on file  . Frequency of Social Gatherings with Friends and Family: Not on file  . Attends Religious Services: Not on file  . Active Member of Clubs or Organizations: Not on file  . Attends Archivist Meetings: Not on file  . Marital Status: Not on file  Intimate Partner Violence:   . Fear of Current or Ex-Partner: Not on file  . Emotionally Abused: Not on file  . Physically Abused: Not on file  . Sexually Abused: Not on file   Physical Exam: Vital signs were reviewed.   General:   Alert, in NAD. No scleral icterus. No bilateral temporal wasting.  HEENT: No scleral icterus. Normal conjuctiva.  Neck: No thyromegaly or LAD Heart:  Regular rate and rhythm; no murmurs Pulm: Clear anteriorly; no wheezing Abdomen:  Soft. Central obesity. Nontender. Nondistended. Normal bowel sounds. No rebound or guarding. No fluid wave.  LAD: No inguinal or umbilical LAD Extremities:  Without edema. Neurologic:  Alert and  oriented x4;  grossly normal neurologically; no asterixis or clonus. Skin: No jaundice. No palmar erythema or spider angioma. No Terry's nails.   Psych:  Alert and cooperative. Normal mood and affect.   Tashiana Lamarca L.  Tarri Glenn, MD, MPH [facility] 12/10/2019, 3:05 PM

## 2019-12-11 ENCOUNTER — Ambulatory Visit (HOSPITAL_COMMUNITY)
Admission: RE | Admit: 2019-12-11 | Discharge: 2019-12-11 | Disposition: A | Discharge status: Home / Self Care | Payer: 59 | Source: Ambulatory Visit | Attending: Gastroenterology | Admitting: Gastroenterology

## 2019-12-11 ENCOUNTER — Other Ambulatory Visit: Payer: Self-pay

## 2019-12-11 ENCOUNTER — Other Ambulatory Visit (INDEPENDENT_AMBULATORY_CARE_PROVIDER_SITE_OTHER): Payer: 59

## 2019-12-11 DIAGNOSIS — R748 Abnormal levels of other serum enzymes: Secondary | ICD-10-CM | POA: Diagnosis present

## 2019-12-11 DIAGNOSIS — K219 Gastro-esophageal reflux disease without esophagitis: Secondary | ICD-10-CM

## 2019-12-11 LAB — FERRITIN: Ferritin: 216 ng/mL (ref 10.0–291.0)

## 2019-12-11 LAB — IRON: Iron: 200 ug/dL — ABNORMAL HIGH (ref 42–145)

## 2019-12-11 LAB — GLUCOSE, RANDOM: Glucose, Bld: 123 mg/dL — ABNORMAL HIGH (ref 70–99)

## 2019-12-17 ENCOUNTER — Encounter: Payer: Self-pay | Admitting: Physical Therapy

## 2019-12-17 ENCOUNTER — Encounter: Payer: Self-pay | Admitting: *Deleted

## 2019-12-17 ENCOUNTER — Ambulatory Visit (INDEPENDENT_AMBULATORY_CARE_PROVIDER_SITE_OTHER): Payer: 59 | Admitting: Physical Therapy

## 2019-12-17 ENCOUNTER — Ambulatory Visit (HOSPITAL_COMMUNITY)
Admission: RE | Admit: 2019-12-17 | Discharge: 2019-12-17 | Disposition: A | Discharge status: Home / Self Care | Payer: 59 | Source: Ambulatory Visit | Attending: Gastroenterology | Admitting: Gastroenterology

## 2019-12-17 ENCOUNTER — Other Ambulatory Visit: Payer: Self-pay

## 2019-12-17 DIAGNOSIS — K219 Gastro-esophageal reflux disease without esophagitis: Secondary | ICD-10-CM | POA: Insufficient documentation

## 2019-12-17 DIAGNOSIS — M545 Low back pain, unspecified: Secondary | ICD-10-CM

## 2019-12-17 DIAGNOSIS — M542 Cervicalgia: Secondary | ICD-10-CM | POA: Diagnosis not present

## 2019-12-17 DIAGNOSIS — R748 Abnormal levels of other serum enzymes: Secondary | ICD-10-CM | POA: Diagnosis present

## 2019-12-17 DIAGNOSIS — M25512 Pain in left shoulder: Secondary | ICD-10-CM

## 2019-12-18 LAB — HEPATITIS B SURFACE ANTIBODY,QUALITATIVE: Hep B S Ab: NONREACTIVE

## 2019-12-18 LAB — ANTI-SMOOTH MUSCLE ANTIBODY, IGG: Actin (Smooth Muscle) Antibody (IGG): <20 U (ref <20)

## 2019-12-18 LAB — IGG: IgG (Immunoglobin G), Serum: 770 mg/dL (ref 600–1640)

## 2019-12-18 LAB — HEPATITIS B CORE ANTIBODY, TOTAL: Hep B Core Total Ab: NONREACTIVE

## 2019-12-18 LAB — ANTI-NUCLEAR AB-TITER (ANA TITER): ANA Titer 1: 1:40 {titer} — ABNORMAL HIGH

## 2019-12-18 LAB — HEPATITIS B CORE ANTIBODY, IGM: Hep B C IgM: NONREACTIVE

## 2019-12-18 LAB — ANA: Anti Nuclear Antibody (ANA): POSITIVE — AB

## 2019-12-18 LAB — IGM: IgM, Serum: 94 mg/dL (ref 50–300)

## 2019-12-18 LAB — MITOCHONDRIAL ANTIBODIES: Mitochondrial M2 Ab, IgG: <=20 U

## 2019-12-19 ENCOUNTER — Encounter: Payer: Self-pay | Admitting: Physical Therapy
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
## 2019-12-22 ENCOUNTER — Ambulatory Visit (INDEPENDENT_AMBULATORY_CARE_PROVIDER_SITE_OTHER): Payer: 59 | Admitting: Physical Therapy

## 2019-12-22 ENCOUNTER — Encounter: Payer: Self-pay | Admitting: *Deleted

## 2019-12-22 ENCOUNTER — Encounter: Payer: Self-pay | Admitting: Family Medicine

## 2019-12-22 ENCOUNTER — Encounter: Payer: Self-pay | Admitting: Physical Therapy

## 2019-12-22 ENCOUNTER — Other Ambulatory Visit: Payer: Self-pay

## 2019-12-22 ENCOUNTER — Ambulatory Visit (INDEPENDENT_AMBULATORY_CARE_PROVIDER_SITE_OTHER): Payer: 59 | Admitting: Family Medicine

## 2019-12-22 VITALS — BP 130/82 | HR 92 | Ht 64.0 in | Wt 231.6 lb

## 2019-12-22 DIAGNOSIS — M545 Low back pain, unspecified: Secondary | ICD-10-CM

## 2019-12-22 DIAGNOSIS — M25562 Pain in left knee: Secondary | ICD-10-CM

## 2019-12-22 DIAGNOSIS — G8929 Other chronic pain: Secondary | ICD-10-CM

## 2019-12-22 DIAGNOSIS — M542 Cervicalgia: Secondary | ICD-10-CM | POA: Diagnosis not present

## 2019-12-22 DIAGNOSIS — M25512 Pain in left shoulder: Secondary | ICD-10-CM

## 2019-12-22 DIAGNOSIS — M25561 Pain in right knee: Secondary | ICD-10-CM | POA: Diagnosis not present

## 2019-12-22 LAB — INSULIN, FREE AND TOTAL
Free Insulin: 32 uU/mL — ABNORMAL HIGH
Total Insulin: 32 uU/mL

## 2019-12-22 NOTE — Progress Notes (Signed)
   I, [name], LAT, ATC, am serving as scribe for Dr. [name].  Lori [name] is a 54 y.o. female who presents to [place] [facility] today for f/u of neck and B knee pain.  Pt was last seen on 11/19/19 and reported feeling slightly improved.  She has been attending outpatient PT and has been getting dry needling for her neck.  She is also followed by pain management who prescribes both oxycodone and percocet for her.  She is currently on short term disability for work and is currently not working due to her symptoms.  Since her last visit, pt reports 75-80% improved.  She reports that her back and neck are tight but less painful.  She states that her R shoulder is now bothering her more than her L.  Aggravating factors for the R shoulder include R shoulder AROM against gravity.  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  She notes that she may be able to work for home and is interested in trying this.  She had bilateral knee injections at the last visit and notes that her knees are feeling a lot better now.  She also notes that the planned lumbar facet ablation was delayed.  Relevant historical information: Chronic pain history and new diagnosis of fatty liver disease.   ROS:  As above  Exam:  BP 130/82 (BP Location: Left Arm, Patient Position: Sitting, Cuff Size: Large)   Pulse 92   Ht 5\' 4"  (1.626 m)   Wt 231 lb 9.6 oz (105.1 kg)   LMP 09/27/2016 (Approximate)   SpO2 95%   BMI 39.75 kg/m   MSK:  C-spine: Normal-appearing decreased motion.  Upper extremity motion and strength is intact. L-spine decreased motion normal gait. Knees normal motion.  Normal gait.      Assessment and Plan: 54 y.o. female with  Cervical thoracic lumbar spine pain.  General improvement with physical therapy.  Patient continues to have symptoms above her baseline of chronic pain.  However she has  had significant improvement with physical therapy. Plan to modified job duty allow to work from home with a computer.  Discussed ergonomic set up at home and the importance of proper monitor placement to reduce neck pain.  Knee pain has significantly improved following injection.  We will write work note to modify job duties and will recheck back in 1 month.   PDMP reviewed during this encounter.   Discussed warning signs or symptoms. Please see discharge instructions. Patient expresses understanding.  The above documentation has been reviewed and is accurate and complete [name]   We will send a copy of this letter to PCP as well as Dr.Bodea at [facility] [place],  [address] Main: [phone] Fax: [phone]

## 2019-12-22 NOTE — Patient Instructions (Addendum)
Thank I think working from home is a great idea.  Ok to use tylenol Arthritis 650mg  every 8 hours as needed for pain.  This may allow you to use less oxycodone which is good.  Check back in 1 month.  Return sooner if needed.
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
## 2020-01-15 ENCOUNTER — Institutional Professional Consult (permissible substitution): Payer: 59 | Admitting: Internal Medicine

## 2020-01-21 ENCOUNTER — Other Ambulatory Visit: Payer: Self-pay | Admitting: Family Medicine

## 2020-01-21 DIAGNOSIS — M542 Cervicalgia: Secondary | ICD-10-CM

## 2020-01-22 ENCOUNTER — Encounter: Payer: Self-pay | Admitting: Family Medicine

## 2020-01-22 ENCOUNTER — Other Ambulatory Visit: Payer: Self-pay

## 2020-01-22 ENCOUNTER — Ambulatory Visit: Payer: 59 | Admitting: Family Medicine

## 2020-01-22 VITALS — BP 156/90 | HR 105 | Ht 64.0 in | Wt 238.6 lb

## 2020-01-22 DIAGNOSIS — M542 Cervicalgia: Secondary | ICD-10-CM | POA: Diagnosis not present

## 2020-01-22 NOTE — Patient Instructions (Signed)
Thank you for coming in today. I think you are close to maximum medical improvement.  Lets see how you do over the next 3 months.  Recheck in 3 months.  If then same then I am happy to formally state that and close the case.  I am happy to see you sooner or at least talk sooner if needed.
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
## 2020-01-28 ENCOUNTER — Telehealth: Payer: 59 | Admitting: Family Medicine

## 2020-01-28 NOTE — Progress Notes (Signed)
Patient missed appointment today. Looks like blood pressure has been elevated at sports medicine. Please reschedule.

## 2020-01-28 NOTE — Progress Notes (Signed)
No show

## 2020-01-30 ENCOUNTER — Telehealth: Payer: Self-pay | Admitting: *Deleted

## 2020-01-30 NOTE — Telephone Encounter (Signed)
-----   Message from Caren Macadam, MD sent at 01/28/2020  5:57 PM EST -----   ----- Message ----- From: Gregor Hams, MD Sent: 01/22/2020   3:30 PM EST To: Caren Macadam, MD

## 2020-01-30 NOTE — Telephone Encounter (Signed)
Spoke with the pt and informed her of the message below.  Patient she forgot to call regarding the appt.  Follow up scheduled for 3/5 to arrive at 10:45am.  Message sent to PCP.

## 2020-02-02 ENCOUNTER — Other Ambulatory Visit: Payer: Self-pay

## 2020-02-02 ENCOUNTER — Encounter (HOSPITAL_BASED_OUTPATIENT_CLINIC_OR_DEPARTMENT_OTHER): Payer: Self-pay | Admitting: *Deleted

## 2020-02-02 ENCOUNTER — Emergency Department (HOSPITAL_BASED_OUTPATIENT_CLINIC_OR_DEPARTMENT_OTHER)
Admission: EM | Admit: 2020-02-02 | Discharge: 2020-02-02 | Disposition: A | Discharge status: Home / Self Care | Payer: 59 | Attending: Emergency Medicine | Admitting: Emergency Medicine

## 2020-02-02 ENCOUNTER — Encounter: Payer: Self-pay | Admitting: Family Medicine

## 2020-02-02 ENCOUNTER — Telehealth: Payer: Self-pay | Admitting: Family Medicine

## 2020-02-02 ENCOUNTER — Emergency Department (HOSPITAL_BASED_OUTPATIENT_CLINIC_OR_DEPARTMENT_OTHER): Payer: 59

## 2020-02-02 DIAGNOSIS — F17210 Nicotine dependence, cigarettes, uncomplicated: Secondary | ICD-10-CM | POA: Diagnosis not present

## 2020-02-02 DIAGNOSIS — R519 Headache, unspecified: Secondary | ICD-10-CM | POA: Diagnosis present

## 2020-02-02 DIAGNOSIS — Z888 Allergy status to other drugs, medicaments and biological substances status: Secondary | ICD-10-CM | POA: Diagnosis not present

## 2020-02-02 DIAGNOSIS — Z885 Allergy status to narcotic agent status: Secondary | ICD-10-CM | POA: Diagnosis not present

## 2020-02-02 DIAGNOSIS — Z880 Allergy status to penicillin: Secondary | ICD-10-CM | POA: Diagnosis not present

## 2020-02-02 DIAGNOSIS — G44219 Episodic tension-type headache, not intractable: Secondary | ICD-10-CM | POA: Insufficient documentation

## 2020-02-02 DIAGNOSIS — I10 Essential (primary) hypertension: Secondary | ICD-10-CM | POA: Diagnosis not present

## 2020-02-02 DIAGNOSIS — G894 Chronic pain syndrome: Secondary | ICD-10-CM | POA: Insufficient documentation

## 2020-02-02 DIAGNOSIS — Z91040 Latex allergy status: Secondary | ICD-10-CM | POA: Diagnosis not present

## 2020-02-02 DIAGNOSIS — E876 Hypokalemia: Secondary | ICD-10-CM | POA: Insufficient documentation

## 2020-02-02 DIAGNOSIS — Z79899 Other long term (current) drug therapy: Secondary | ICD-10-CM | POA: Diagnosis not present

## 2020-02-02 LAB — CBC WITH DIFFERENTIAL/PLATELET
Abs Immature Granulocytes: 0.03 10*3/uL (ref 0.00–0.07)
Basophils Absolute: 0.1 10*3/uL (ref 0.0–0.1)
Basophils Relative: 1 %
Eosinophils Absolute: 0.2 10*3/uL (ref 0.0–0.5)
Eosinophils Relative: 2 %
HCT: 45.9 % (ref 36.0–46.0)
Hemoglobin: 15.4 g/dL — ABNORMAL HIGH (ref 12.0–15.0)
Immature Granulocytes: 0 %
Lymphocytes Relative: 40 %
Lymphs Abs: 4.3 10*3/uL — ABNORMAL HIGH (ref 0.7–4.0)
MCH: 32.1 pg (ref 26.0–34.0)
MCHC: 33.6 g/dL (ref 30.0–36.0)
MCV: 95.6 fL (ref 80.0–100.0)
Monocytes Absolute: 0.6 10*3/uL (ref 0.1–1.0)
Monocytes Relative: 6 %
Neutro Abs: 5.4 10*3/uL (ref 1.7–7.7)
Neutrophils Relative %: 51 %
Platelets: 198 10*3/uL (ref 150–400)
RBC: 4.8 MIL/uL (ref 3.87–5.11)
RDW: 13.8 % (ref 11.5–15.5)
WBC: 10.7 10*3/uL — ABNORMAL HIGH (ref 4.0–10.5)
nRBC: 0 % (ref 0.0–0.2)

## 2020-02-02 LAB — BASIC METABOLIC PANEL
Anion gap: 13 (ref 5–15)
BUN: 13 mg/dL (ref 6–20)
CO2: 25 mmol/L (ref 22–32)
Calcium: 9.6 mg/dL (ref 8.9–10.3)
Chloride: 101 mmol/L (ref 98–111)
Creatinine, Ser: 0.71 mg/dL (ref 0.44–1.00)
GFR calc Af Amer: >60 mL/min (ref >60)
GFR calc non Af Amer: >60 mL/min (ref >60)
Glucose, Bld: 113 mg/dL — ABNORMAL HIGH (ref 70–99)
Potassium: 3.1 mmol/L — ABNORMAL LOW (ref 3.5–5.1)
Sodium: 139 mmol/L (ref 135–145)

## 2020-02-02 MED ORDER — POTASSIUM CHLORIDE CRYS ER 20 MEQ PO TBCR
40.0000 meq | EXTENDED_RELEASE_TABLET | Freq: Once | ORAL | Status: AC
  Administered 2020-02-02: 40 meq via ORAL
  Filled 2020-02-02: qty 2

## 2020-02-02 MED ORDER — KETOROLAC TROMETHAMINE 15 MG/ML IJ SOLN
15.0000 mg | Freq: Once | INTRAMUSCULAR | Status: AC
  Administered 2020-02-02: 15 mg via INTRAVENOUS
  Filled 2020-02-02: qty 1

## 2020-02-02 NOTE — Telephone Encounter (Signed)
The patient's blood pressure is 160/100 she wanted to know if she could see Dr. Inocente Salles today .    I offered an appointment with Dr. Elease Hashimoto for today at 3:45  pt declined and said she could not wait that long, and her work nurse  advised her to go to the ER if she could not be seen today by her doctor. Want a  call back to advise on what to do and if she could be worked  in with Dr. Inocente Salles.  Contact  [phone]

## 2020-02-02 NOTE — ED Triage Notes (Signed)
Headache x 2 months on and off.

## 2020-02-02 NOTE — Telephone Encounter (Signed)
I called patient, but she did not answer.  I left a message stating that you would try to call her back in touch base.  I would like to keep her from having to go to the emergency room.  Please see if she is having any other symptoms?  I think an appointment with Dr. Elease Hashimoto this afternoon is very reasonable.  As long as she is not having symptoms like severe headache, chest pain, chest pressure, shortness of breath, I do not think she needs to go to the emergency room at this time and can wait another 2 hours for a visit with Dr. Elease Hashimoto.  If she is having any of the above symptoms, then she should proceed to the ER.  She has had some elevated blood pressures in the past, which is why I wanted to see her in follow-up, so unfortunately I suspect she has been having elevated blood pressure for some time. If you contact her before I leave today; please let me know so I can try to address.

## 2020-02-02 NOTE — ED Notes (Signed)
Presents with occipital HA and with increase in BP, states onset, approx 1 month ago.

## 2020-02-02 NOTE — Telephone Encounter (Signed)
Patient called back and was informed of the message below.  Patient stated she has had a severe headache, nausea and sweating and was seen and evaluated at the [facility] and told she has a problem with her potassium.  Message sent to PCP.

## 2020-02-02 NOTE — Discharge Instructions (Addendum)
Thank you for allowing me to care for you today. Please return to the emergency department if you have new or worsening symptoms. Take your medications as instructed.  Please follow up with your primary care provider in regards to your blood pressure.
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
## 2020-02-03 ENCOUNTER — Ambulatory Visit: Payer: 59 | Admitting: Family Medicine

## 2020-02-03 ENCOUNTER — Ambulatory Visit: Payer: 59 | Admitting: Pulmonary Disease

## 2020-02-03 ENCOUNTER — Encounter: Payer: Self-pay | Admitting: Pulmonary Disease

## 2020-02-03 ENCOUNTER — Encounter: Payer: Self-pay | Admitting: Family Medicine

## 2020-02-03 VITALS — BP 152/84 | HR 100 | Temp 97.9°F | Ht 65.0 in | Wt 238.2 lb

## 2020-02-03 VITALS — BP 170/90 | HR 104 | Temp 97.2°F | Ht 65.0 in | Wt 236.0 lb

## 2020-02-03 DIAGNOSIS — E876 Hypokalemia: Secondary | ICD-10-CM | POA: Diagnosis not present

## 2020-02-03 DIAGNOSIS — F17200 Nicotine dependence, unspecified, uncomplicated: Secondary | ICD-10-CM | POA: Diagnosis not present

## 2020-02-03 DIAGNOSIS — G4733 Obstructive sleep apnea (adult) (pediatric): Secondary | ICD-10-CM

## 2020-02-03 DIAGNOSIS — I10 Essential (primary) hypertension: Secondary | ICD-10-CM | POA: Diagnosis not present

## 2020-02-03 DIAGNOSIS — Z9989 Dependence on other enabling machines and devices: Secondary | ICD-10-CM | POA: Diagnosis not present

## 2020-02-03 DIAGNOSIS — K760 Fatty (change of) liver, not elsewhere classified: Secondary | ICD-10-CM | POA: Diagnosis not present

## 2020-02-03 MED ORDER — LOSARTAN POTASSIUM 50 MG PO TABS: 50.0000 mg | ORAL_TABLET | Freq: Every day | ORAL | 5 refills | Status: DC

## 2020-02-03 NOTE — Progress Notes (Addendum)
Subjective:    Patient ID: Lori [name], female    DOB: 1966-11-04, 54 y.o.   MRN: [id number]  Patient with a history of obstructive sleep apnea  Mild obstructive sleep apnea diagnosed in 2017 Continues to use CPAP on a regular basis  Active smoker History of hypertension  Compliance is much better Tries to get an adequate number of hours of sleep at night Still has some daytime fatigue  She does have some issues with mask fit Currently uses nasal pillows with a chinstrap Has tried a nose mask in the past  She usually goes to bed between 9 and 12 Sometimes takes a few minutes to fall asleep and sometimes 1 to 2 hours 2-3 awakenings Final awakening time is 4:30 AM  She has gained about 50 pounds since her hysterectomy in 2017  Smokes about half a pack a day  Past Medical History:  Diagnosis Date  . ALLERGIC RHINITIS 03/14/2010  . BACK PAIN 01/24/2010  . Buttock pain   . GERD 12/01/2008  . HIATAL HERNIA 05/27/2010  . Hyperlipidemia   . 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 Numbness and tingling of left upper and lower extremity   . OSTEOARTHRITIS 12/01/2008  . Sleep apnea   . Thigh pain   . 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 TOBACCO USE 12/01/2008  . VITAMIN D DEFICIENCY 05/23/2010   Social History   Socioeconomic History  . Marital status: Legally Separated    Spouse name: Not on file  . Number of children: 1  . Years of education: Not on file  . Highest education level: Not on file  Occupational History  . Occupation: [employer] user  Tobacco Use  . Smoking status: Current Every Day Smoker    Packs/day: 1.00    Years: 31.00    Pack years: 31.00    Types: Cigarettes  . Smokeless tobacco: Never Used  . Tobacco comment: now smoking 0.5 packs a day  Substance and Sexual Activity  . Alcohol use: Yes    Comment: social  . Drug use: No  . Sexual activity: Not on file  Other Topics Concern  . Not on file  Social History Narrative  . Not on file   Social  Determinants of Health   Financial Resource Strain:   . Difficulty of Paying Living Expenses: Not on file  Food Insecurity:   . Worried About Charity fundraiser in the Last Year: Not on file  . Ran Out of Food in the Last Year: Not on file  Transportation Needs:   . Lack of Transportation (Medical): Not on file  . Lack of Transportation (Non-Medical): Not on file  Physical Activity:   . Days of Exercise per Week: Not on file  . Minutes of Exercise per Session: Not on file  Stress:   . Feeling of Stress : Not on file  Social Connections:   . Frequency of Communication with Friends and Family: Not on file  . Frequency of Social Gatherings with Friends and Family: Not on file  . Attends Religious Services: Not on file  . Active Member of Clubs or Organizations: Not on file  . Attends Archivist Meetings: Not on file  . Marital Status: Not on file  Intimate Partner Violence:   . Fear of Current or Ex-Partner: Not on file  . Emotionally Abused: Not on file  . Physically Abused: Not on file  . Sexually Abused: Not on file   Family History  Problem Relation Age of Onset  . Breast cancer Mother 52  . High blood pressure Mother   . High Cholesterol Mother   . Diabetes Mother   . Diverticulitis Mother 2  . Heart attack Mother 64       during hospitalization with diverticulitis  . Alzheimer's disease Father   . Alcohol abuse Father   . Colon polyps Half-Sister   . Colonic polyp Half-Brother   . Esophageal cancer Neg Hx   . Colon cancer Neg Hx   . Rectal cancer Neg Hx   . Stomach cancer Neg Hx    Review of Systems  Constitutional: Positive for unexpected weight change. Negative for fever.  HENT: Positive for congestion and sneezing. Negative for dental problem, ear pain, nosebleeds, postnasal drip, rhinorrhea, sinus pressure, sore throat and trouble swallowing.   Eyes: Negative for redness and itching.  Respiratory: Negative for cough, chest tightness, shortness of  breath and wheezing.   Cardiovascular: Negative for palpitations and leg swelling.  Gastrointestinal: Negative for nausea and vomiting.  Genitourinary: Negative for dysuria.  Musculoskeletal: Positive for joint swelling.  Skin: Negative for rash.  Allergic/Immunologic: Negative.  Negative for environmental allergies, food allergies and immunocompromised state.  Neurological: Negative for headaches.  Hematological: Does not bruise/bleed easily.  Psychiatric/Behavioral: Positive for dysphoric mood. The patient is nervous/anxious.        Objective:   Physical Exam HENT:     Head: Normocephalic and atraumatic.     Nose: Nose normal.     Mouth/Throat:     Mouth: Mucous membranes are moist.  Eyes:     Extraocular Movements: Extraocular movements intact.     Pupils: Pupils are equal, round, and reactive to light.  Cardiovascular:     Rate and Rhythm: Normal rate and regular rhythm.     Heart sounds: No murmur.  Pulmonary:     Effort: Pulmonary effort is normal. No respiratory distress.     Breath sounds: No stridor. No wheezing or rhonchi.  Musculoskeletal:        General: Normal range of motion.     Cervical back: Normal range of motion and neck supple. No rigidity or tenderness.  Lymphadenopathy:     Cervical: No cervical adenopathy.  Skin:    General: Skin is warm.     Coloration: Skin is not jaundiced or pale.  Neurological:     General: No focal deficit present.     Mental Status: She is alert.  Psychiatric:        Mood and Affect: Mood normal.    Vitals:   02/03/20 1631  BP: (!) 152/84  Pulse: 100  Temp: 97.9 F (36.6 C)  SpO2: 98%   Results of the [facility] flowsheet 02/03/2020 07/15/2019  Sitting and reading 3 3  Watching TV 3 2  Sitting, inactive in a public place (e.g. a theatre or a meeting) 2 3  As a passenger in a car for an hour without a break 1 0  Lying down to rest in the afternoon when circumstances permit 1 0  Sitting and talking to someone 2 1    Sitting quietly after a lunch without alcohol 2 1  In a car, while stopped for a few minutes in traffic 1 0  Total score 15 10     Compliance data reveals 87% compliance Machine set between 5 and 10 Median pressure of 9.6 with 95 percentile pressure of 9.8 AHI of 1.8 Assessment & Plan:  .  Mild obstructive sleep  apnea -Improving compliance with CPAP use  Uncontrolled hypertension -Recently started on losartan -She does have an appointment to follow-up with her PCP soon  Plan Encouraged to use CPAP on a regular basis  Encouraged to work on diet and exercise  Smoking cessation counseling  Referral for lung cancer screening  I will see her back in about 3 months  She may consider changing masks

## 2020-02-03 NOTE — Patient Instructions (Signed)

## 2020-02-03 NOTE — Patient Instructions (Signed)
Continue CPAP use  Try and get at least 6 hours of sleep every night  Regular graded exercises will help  Continue efforts to keep your blood pressure is controlled as you can get it  I will see you back in about 3 months  Call with significant concerns

## 2020-02-03 NOTE — Addendum Note (Signed)
Addended by: Robbie Louis on: 02/03/2020 05:27 PM   Modules accepted: Orders

## 2020-02-03 NOTE — Progress Notes (Signed)
Subjective:     Patient ID: Lori [name], female   DOB: [birth year], 54 y.o.   MRN: [id number]  HPI Shyrl is seen with concerns for elevated blood pressure. She eczema to the ER yesterday with headache and had elevated blood pressure there. She had elevated reading yesterday at work of 160/100. She does not take any blood pressure medications currently. She does have history of obstructive sleep apnea is on CPAP. Other medical problems include history of obesity, GERD, elevated liver transaminases, and evidence for hepatic steatosis on abdominal ultrasound.   She states her mother has hypertension. She went to ER yesterday and had CT head which was unremarkable. Labs were significant for potassium 3.1. She was given oral replacement. She received Toradol for her headache and that did improve. She had some nonspecific malaise recently. No regular alcohol. Does take Effexor which can have some effect on blood pressure  Past Medical History:  Diagnosis Date  . ALLERGIC RHINITIS 03/14/2010  . BACK PAIN 01/24/2010  . Buttock pain   . GERD 12/01/2008  . HIATAL HERNIA 05/27/2010  . 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 Numbness and tingling of left upper and lower extremity   . OSTEOARTHRITIS 12/01/2008  . Sleep apnea   . Thigh pain   . 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 TOBACCO USE 12/01/2008  . VITAMIN D DEFICIENCY 05/23/2010   Past Surgical History:  Procedure Laterality Date  . ABDOMINAL HYSTERECTOMY N/A 10/17/2016   Procedure: HYSTERECTOMY ABDOMINAL;  Surgeon: Sanjuana Kava, MD;  Location: [facility]; benign mass in uterus  . APPENDECTOMY    . BREAST SURGERY     lumpectomy  . CERVICAL FUSION     x2; 2007, 2011  . CESAREAN SECTION  1988  . COLONOSCOPY    . CYSTOSCOPY N/A 10/17/2016   Procedure: CYSTOSCOPY;  Surgeon: Sanjuana Kava, MD;  Location: [facility];  Service: Gynecology;  Laterality: N/A;  . DILATION AND CURETTAGE OF UTERUS     miscarriage  . ESOPHAGEAL MANOMETRY    . ESOPHAGOGASTRODUODENOSCOPY     . GASTRIC FUNDOPLICATION     Nissen  . LAPAROSCOPIC NISSEN FUNDOPLICATION    . LUMBAR DISC SURGERY    . LUMBAR LAMINECTOMY  2005  . SALPINGOOPHORECTOMY Bilateral 10/17/2016   Procedure: SALPINGO OOPHORECTOMY;  Surgeon: Sanjuana Kava, MD;  Location: [facility];  Service: Gynecology;  Laterality: Bilateral;  . TONSILLECTOMY AND ADENOIDECTOMY    . UPPER GASTROINTESTINAL ENDOSCOPY      reports that she has been smoking cigarettes. She has a 31.00 pack-year smoking history. She has never used smokeless tobacco. She reports current alcohol use. She reports that she does not use drugs. family history includes Alcohol abuse in her father; Alzheimer's disease in her father; Breast cancer (age of onset: 41) in her mother; Colon polyps in her half-sister; Colonic polyp in her half-brother; Diabetes in her mother; Diverticulitis (age of onset: 41) in her mother; Heart attack (age of onset: 51) in her mother; High Cholesterol in her mother; High blood pressure in her mother. Allergies  Allergen Reactions  . Amoxicillin Nausea Only    Yeast Infection Has patient had a PCN reaction causing immediate rash, facial/tongue/throat swelling, SOB or lightheadedness with hypotension: no Has patient had a PCN reaction causing severe rash involving mucus membranes or skin necrosis: no Has patient had a PCN reaction that required hospitalization yes Has patient had a PCN reaction occurring within the last 10 years: yes If all of the above answers are "NO", then may  proceed with Cephalosporin use.  Yeast Infection Has patient had a PCN reaction causing immediate rash, facial/tongue/throat swelling, SOB or lightheadedness with hypotension: no Has patient had a PCN reaction causing severe rash involving mucus membranes or skin necrosis: no Has patient had a PCN reaction that required hospitalization yes Has patient had a PCN reaction occurring within the last 10 years: yes If all of the above answers are "NO", then may  proceed with Cephalosporin use.  Marland Kitchen Morphine Other (See Comments)    REACTION: hyper REACTION: hyper  . Thiethylperazine Other (See Comments)  . Latex Rash     Review of Systems  Constitutional: Positive for fatigue.  Eyes: Negative for visual disturbance.  Respiratory: Negative for cough, chest tightness, shortness of breath and wheezing.   Cardiovascular: Negative for chest pain, palpitations and leg swelling.  Genitourinary: Negative for dysuria.  Neurological: Positive for headaches. Negative for dizziness, seizures, syncope, weakness and light-headedness.       Objective:   Physical Exam Vitals reviewed.  Constitutional:      Appearance: Normal appearance.  Eyes:     Pupils: Pupils are equal, round, and reactive to light.  Cardiovascular:     Rate and Rhythm: Normal rate and regular rhythm.  Pulmonary:     Effort: Pulmonary effort is normal.     Breath sounds: Normal breath sounds.  Musculoskeletal:     Right lower leg: No edema.     Left lower leg: No edema.  Neurological:     Mental Status: She is alert.        Assessment:     #1 hypertension. Currently untreated. Multiple elevated readings recently.  #2 history of chronic elevated liver transaminases with history of fatty liver changes  #3 recent mild hypokalemia by labs yesterday in ER. Patient given oral replacement    Plan:     -Discussed nonpharmacologic management of hypertension including weight loss, regular exercise, sodium reduction -Start losartan 50 mg once daily. She has follow-up with primary in 2 weeks. Recheck basic metabolic panel been -Watch sodium intake and increase potassium rich foods -Discussed significance of fatty liver issues including risk of progression to cirrhosis. She is strongly encouraged to lose weight. She will discuss further with primary at follow-up  Eulas Post MD [facility]

## 2020-02-03 NOTE — Telephone Encounter (Signed)
She saw Dr. Elease Hashimoto today and she does have a follow-up already scheduled with me.  He replaced her potassium and started her on losartan for blood pressure control.

## 2020-02-06 ENCOUNTER — Telehealth: Payer: Self-pay | Admitting: *Deleted

## 2020-02-06 NOTE — Telephone Encounter (Signed)
Spoke with the pt and informed her of the message below.  Patient stated she has an appt on 3/5 and agreed to call back sooner if needed.

## 2020-02-06 NOTE — Telephone Encounter (Signed)
Yes; ok to take lasix. Make sure no other symptoms; and if not improving needs to be seen.

## 2020-02-06 NOTE — Telephone Encounter (Signed)
Patient called and stated her hands, knees, ankles and feet are swelling and wanted to know if it was okay to take old Rx of Lasix. Patient stated that she was seen recently by Dr. Elease Hashimoto and was started on some blood pressure medication. Please advise

## 2020-02-12 ENCOUNTER — Other Ambulatory Visit: Payer: Self-pay | Admitting: Family Medicine

## 2020-02-12 ENCOUNTER — Telehealth: Payer: Self-pay | Admitting: Acute Care

## 2020-02-12 ENCOUNTER — Telehealth: Payer: Self-pay | Admitting: *Deleted

## 2020-02-12 DIAGNOSIS — F17210 Nicotine dependence, cigarettes, uncomplicated: Secondary | ICD-10-CM

## 2020-02-12 MED ORDER — WELLBUTRIN SR 150 MG PO TB12: 150.0000 mg | ORAL_TABLET | Freq: Two times a day (BID) | ORAL | 3 refills | Status: DC

## 2020-02-12 NOTE — Telephone Encounter (Signed)
[facility] faxed a refill request for Bupropion XL.  Message sent to PCP as med listed as historical.

## 2020-02-12 NOTE — Telephone Encounter (Signed)
I don't think we have filled [facility] before for her but I am ok with 1 year refill for this if she does desire this pharmacy. Could you just confirm pharmacy because the one listed is different than [facility] I usually see?  Could you also cancel the refill to [facility] because I sent there by mistake (thanks)

## 2020-02-12 NOTE — Progress Notes (Signed)
Lori [name] DOB: 20-Apr-1966 Encounter date: 02/13/2020  This is a 54 y.o. female who presents with Chief Complaint  Patient presents with  . Follow-up    due to elevated BP  . Headache    patient complains of pain in the right temple area and right eye x2 months  . Nausea    patient complains of nausea since starting Losartan    History of present illness: Patient was seen by Dr. Elease Hashimoto on 2/23 for elevated blood pressure.  She had gone to the ER due to this and had negative evaluation except for hypokalemia.  Losartan 50 mg daily was started. (of note, she no showed 01/22/20 and 01/28/20 appointments). Still feels tight in hands. Knees and ankles felt tight. Did start lasix as well and that has helped with tightness. BP at work has been in 130's/90's.   A1C in November checked by Dr. Lorenso Courier was 6.6.   Took meds this morning without eating. Has headache, right sided maxillary sinus pain.   Still having a lot of pain. Feels like body is fighting her. Hip pain bilateral.   Has some tingling in feet. Sweating a lot with everything she does. Can't even put make up on because she is sweating it off. Hair soaking wet with sweating.   Effexor given for hot flashes. Did help with crying spells. Wellbutrin was helping her cut back on cigarettes, but this past week had 3 friends die. Does feel like this has helped with craving.   Fatigue; hard to get moving. Body hard to get moving.   Fingers and wrists are tender.   Allergies  Allergen Reactions  . Amoxicillin Nausea Only    Yeast Infection Has patient had a PCN reaction causing immediate rash, facial/tongue/throat swelling, SOB or lightheadedness with hypotension: no Has patient had a PCN reaction causing severe rash involving mucus membranes or skin necrosis: no Has patient had a PCN reaction that required hospitalization yes Has patient had a PCN reaction occurring within the last 10 years: yes If all of the above answers are "NO",  then may proceed with Cephalosporin use.  Yeast Infection Has patient had a PCN reaction causing immediate rash, facial/tongue/throat swelling, SOB or lightheadedness with hypotension: no Has patient had a PCN reaction causing severe rash involving mucus membranes or skin necrosis: no Has patient had a PCN reaction that required hospitalization yes Has patient had a PCN reaction occurring within the last 10 years: yes If all of the above answers are "NO", then may proceed with Cephalosporin use.  Marland Kitchen Morphine Other (See Comments)    REACTION: hyper REACTION: hyper  . Thiethylperazine Other (See Comments)  . Latex Rash   Current Meds  Medication Sig  . Biotin w/ Vitamins C & E (HAIR/SKIN/NAILS PO) Take 1 tablet by mouth every morning.  . cetirizine (ZYRTEC) 10 MG tablet Take 10 mg by mouth at bedtime.  . Cholecalciferol (VITAMIN D3 PO) Take 1,000 Units by mouth daily.  . diazepam (VALIUM) 5 MG tablet TAKE 1 TABLET BY MOUTH EVERY 12 HOURS AS NEEDED FOR ANXIETY OR MUSCLE SPASMS  . fluticasone (FLONASE) 50 MCG/ACT nasal spray Place 2 sprays into both nostrils daily.  Marland Kitchen ibuprofen (ADVIL) 200 MG tablet Take 800 mg by mouth as needed.  Marland Kitchen losartan (COZAAR) 50 MG tablet Take 1 tablet (50 mg total) by mouth daily.  . Multiple Vitamins-Minerals (EMERGEN-C IMMUNE PO) Take 1 tablet by mouth daily. With zinc  . Oxycodone HCl 10 MG TABS Take 10  mg by mouth 5 (five) times daily as needed.  . [DISCONTINUED] venlafaxine XR (EFFEXOR-XR) 150 MG 24 hr capsule Take 150 mg by mouth at bedtime.   . [DISCONTINUED] WELLBUTRIN SR 150 MG 12 hr tablet Take 1 tablet (150 mg total) by mouth 2 (two) times daily.   Current Facility-Administered Medications for the 02/13/20 encounter (Telemedicine) with Caren Macadam, MD  Medication  . 0.9 %  sodium chloride infusion    Review of Systems  Constitutional: Positive for fatigue. Negative for chills and fever.  Respiratory: Negative for cough, chest tightness,  shortness of breath and wheezing.   Cardiovascular: Negative for chest pain, palpitations and leg swelling.  Musculoskeletal: Positive for arthralgias, back pain, neck pain and neck stiffness.  Skin:       Excessive sweating    Objective:  BP 138/90 (BP Location: Left Arm, Patient Position: Sitting, Cuff Size: Large)   Pulse 97   Temp (!) 96.5 F (35.8 C) (Temporal)   Ht _0  (1.651 m)   Wt 236 lb 14.4 oz (107.5 kg)   LMP 09/27/2016 (Approximate)   SpO2 94%   BMI 39.42 kg/m   Weight: 236 lb 14.4 oz (107.5 kg)   BP Readings from Last 3 Encounters:  02/13/20 138/90  02/03/20 (!) 152/84  02/03/20 (!) 170/90   Wt Readings from Last 3 Encounters:  02/13/20 236 lb 14.4 oz (107.5 kg)  02/03/20 238 lb 3.2 oz (108 kg)  02/03/20 236 lb (107 kg)    Physical Exam Constitutional:      General: She is not in acute distress.    Appearance: She is well-developed.  Cardiovascular:     Rate and Rhythm: Normal rate and regular rhythm.     Heart sounds: Normal heart sounds. No murmur. No friction rub.  Pulmonary:     Effort: Pulmonary effort is normal. No respiratory distress.     Breath sounds: Normal breath sounds. No wheezing or rales.  Musculoskeletal:     Right lower leg: No edema.     Left lower leg: No edema.  Neurological:     Mental Status: She is alert and oriented to person, place, and time.  Psychiatric:        Behavior: Behavior normal.     Assessment/Plan  1. Hypertension, unspecified type We are going to add hydrochlorothiazide to her daily medication.  She has been taking Lasix, but since blood pressure is not optimally controlled, I would prefer that we control blood pressure with the losartan hydrochlorothiazide combination.  We will need to watch her potassium level since this was decreased recent ER visit. - Comprehensive metabolic panel; Future - CBC with Differential/Platelet; Future - hydrochlorothiazide (HYDRODIURIL) 25 MG tablet; Take 1 tablet (25 mg  total) by mouth daily.  Dispense: 30 tablet; Refill: 2 - CBC with Differential/Platelet - Comprehensive metabolic panel  2. Controlled type 2 diabetes mellitus with hyperglycemia, without long-term current use of insulin ([place]) This is a new diagnosis for her.  Blood work demonstrated diabetes back in the fall, but she missed 2 appointments in follow-up.  We reviewed diabetic diet and carbohydrate counting in detail today.  Although she has been working on smaller portions, she has been eating a diet that is been quite high in carbohydrates.  We discussed importance of regular exercise to help control sugars.  At this point, we are not going to start medication but rather get a repeat A1c and consider medication pending this result.  - Hemoglobin A1c; Future -  Microalbumin / creatinine urine ratio; Future - blood glucose meter kit and supplies KIT; Dispense based on patient and insurance preference. Use up to four times daily as directed. (FOR ICD-9 250.00, 250.01).  Dispense: 1 each; Refill: 0 - Microalbumin / creatinine urine ratio - Hemoglobin A1c  3. Hypokalemia See above.  Encouraged her to incorporate high potassium foods.  Consider supplements since adding hydrochlorothiazide and pending blood work.  4. Hyperlipidemia, unspecified hyperlipidemia type Recheck cholesterol levels.  We discussed that with new diagnosis of diabetes cholesterol goals will be tighter. - Lipid panel; Future - Lipid panel  5. Bilateral hip pain She has been having increased joint pains.  Hips, wrists, bothering her.  Further evaluation pending blood work. - Sedimentation rate; Future - ANA; Future - ANA - Sedimentation rate  6. Fatigue, unspecified type We will evaluate further pending blood work.  We discussed that some of this may be related to diabetes and sugar control.  Discussed importance of regular exercise to help with energy level.  Return in about 1 month (around 03/15/2020) for Chronic condition  visit.  Total time of office visit, patient education about diabetes and diabetic diet, discussion about medication side effects, chart/specialty notes/lab review and discussion with patient as well as charting time: 45 minutes.   Micheline Rough, MD

## 2020-02-13 ENCOUNTER — Telehealth (INDEPENDENT_AMBULATORY_CARE_PROVIDER_SITE_OTHER): Payer: 59 | Admitting: Family Medicine

## 2020-02-13 ENCOUNTER — Other Ambulatory Visit: Payer: Self-pay

## 2020-02-13 ENCOUNTER — Other Ambulatory Visit: Payer: Self-pay | Admitting: Family Medicine

## 2020-02-13 ENCOUNTER — Encounter: Payer: Self-pay | Admitting: Family Medicine

## 2020-02-13 VITALS — BP 138/90 | HR 97 | Temp 96.5°F | Ht 65.0 in | Wt 236.9 lb

## 2020-02-13 DIAGNOSIS — E785 Hyperlipidemia, unspecified: Secondary | ICD-10-CM

## 2020-02-13 DIAGNOSIS — I10 Essential (primary) hypertension: Secondary | ICD-10-CM | POA: Diagnosis not present

## 2020-02-13 DIAGNOSIS — M25551 Pain in right hip: Secondary | ICD-10-CM

## 2020-02-13 DIAGNOSIS — E1165 Type 2 diabetes mellitus with hyperglycemia: Secondary | ICD-10-CM

## 2020-02-13 DIAGNOSIS — E876 Hypokalemia: Secondary | ICD-10-CM

## 2020-02-13 DIAGNOSIS — M25552 Pain in left hip: Secondary | ICD-10-CM | POA: Diagnosis not present

## 2020-02-13 DIAGNOSIS — R5383 Other fatigue: Secondary | ICD-10-CM

## 2020-02-13 LAB — CBC WITH DIFFERENTIAL/PLATELET
Basophils Absolute: 0.1 10*3/uL (ref 0.0–0.1)
Basophils Relative: 1 % (ref 0.0–3.0)
Eosinophils Absolute: 0.2 10*3/uL (ref 0.0–0.7)
Eosinophils Relative: 1.9 % (ref 0.0–5.0)
HCT: 42.2 % (ref 36.0–46.0)
Hemoglobin: 14.2 g/dL (ref 12.0–15.0)
Lymphocytes Relative: 43.1 % (ref 12.0–46.0)
Lymphs Abs: 4.7 10*3/uL — ABNORMAL HIGH (ref 0.7–4.0)
MCHC: 33.6 g/dL (ref 30.0–36.0)
MCV: 96 fl (ref 78.0–100.0)
Monocytes Absolute: 0.6 10*3/uL (ref 0.1–1.0)
Monocytes Relative: 5.1 % (ref 3.0–12.0)
Neutro Abs: 5.4 10*3/uL (ref 1.4–7.7)
Neutrophils Relative %: 48.9 % (ref 43.0–77.0)
Platelets: 210 10*3/uL (ref 150.0–400.0)
RBC: 4.4 Mil/uL (ref 3.87–5.11)
RDW: 14.1 % (ref 11.5–15.5)
WBC: 11 10*3/uL — ABNORMAL HIGH (ref 4.0–10.5)

## 2020-02-13 LAB — COMPREHENSIVE METABOLIC PANEL
ALT: 75 U/L — ABNORMAL HIGH (ref 0–35)
AST: 67 U/L — ABNORMAL HIGH (ref 0–37)
Albumin: 4.5 g/dL (ref 3.5–5.2)
Alkaline Phosphatase: 105 U/L (ref 39–117)
BUN: 12 mg/dL (ref 6–23)
CO2: 30 mEq/L (ref 19–32)
Calcium: 10.1 mg/dL (ref 8.4–10.5)
Chloride: 100 mEq/L (ref 96–112)
Creatinine, Ser: 0.71 mg/dL (ref 0.40–1.20)
GFR: 85.47 mL/min (ref >60.00)
Glucose, Bld: 124 mg/dL — ABNORMAL HIGH (ref 70–99)
Potassium: 4.5 mEq/L (ref 3.5–5.1)
Sodium: 139 mEq/L (ref 135–145)
Total Bilirubin: 0.6 mg/dL (ref 0.2–1.2)
Total Protein: 7.5 g/dL (ref 6.0–8.3)

## 2020-02-13 LAB — MICROALBUMIN / CREATININE URINE RATIO
Creatinine,U: 166.5 mg/dL
Microalb Creat Ratio: 6 mg/g (ref 0.0–30.0)
Microalb, Ur: 10.1 mg/dL — ABNORMAL HIGH (ref 0.0–1.9)

## 2020-02-13 LAB — HEMOGLOBIN A1C: Hgb A1c MFr Bld: 7 % — ABNORMAL HIGH (ref 4.6–6.5)

## 2020-02-13 LAB — LIPID PANEL
Cholesterol: 254 mg/dL — ABNORMAL HIGH (ref 0–200)
HDL: 35 mg/dL — ABNORMAL LOW (ref >39.00)
NonHDL: 219.45
Total CHOL/HDL Ratio: 7
Triglycerides: 278 mg/dL — ABNORMAL HIGH (ref 0.0–149.0)
VLDL: 55.6 mg/dL — ABNORMAL HIGH (ref 0.0–40.0)

## 2020-02-13 LAB — SEDIMENTATION RATE: Sed Rate: 36 mm/hr — ABNORMAL HIGH (ref 0–30)

## 2020-02-13 LAB — LDL CHOLESTEROL, DIRECT: Direct LDL: 169 mg/dL

## 2020-02-13 MED ORDER — BUPROPION HCL ER (SR) 150 MG PO TB12: 150.0000 mg | ORAL_TABLET | Freq: Two times a day (BID) | ORAL | 3 refills | Status: DC

## 2020-02-13 MED ORDER — VENLAFAXINE HCL ER 75 MG PO CP24: 75.0000 mg | ORAL_CAPSULE | Freq: Every day | ORAL | 2 refills | Status: DC

## 2020-02-13 MED ORDER — HYDROCHLOROTHIAZIDE 25 MG PO TABS: 25.0000 mg | ORAL_TABLET | Freq: Every day | ORAL | 2 refills | Status: DC

## 2020-02-13 MED ORDER — BLOOD GLUCOSE MONITOR KIT: PACK | 0 refills | Status: DC

## 2020-02-13 NOTE — Telephone Encounter (Signed)
Patient would like generic Wellbutrin to be sent to [facility]

## 2020-02-13 NOTE — Telephone Encounter (Signed)
Rx sent to [facility].

## 2020-02-13 NOTE — Telephone Encounter (Signed)
I forgot to confirm when she was here - is it [facility]? Or pharmacy linked to bottom of message? OK for refill if you can send.

## 2020-02-13 NOTE — Telephone Encounter (Signed)
LMTC x 1  

## 2020-02-13 NOTE — Patient Instructions (Signed)
Hypokalemia Hypokalemia means that the amount of potassium in the blood is lower than normal. Potassium is a chemical (electrolyte) that helps regulate the amount of fluid in the body. It also stimulates muscle tightening (contraction) and helps nerves work properly. Normally, most of the body's potassium is inside cells, and only a very small amount is in the blood. Because the amount in the blood is so small, minor changes to potassium levels in the blood can be life-threatening. What are the causes? This condition may be caused by:  Antibiotic medicine.  Diarrhea or vomiting. Taking too much of a medicine that helps you have a bowel movement (laxative) can cause diarrhea and lead to hypokalemia.  Chronic kidney disease (CKD).  Medicines that help the body get rid of excess fluid (diuretics).  Eating disorders, such as bulimia.  Low magnesium levels in the body.  Sweating a lot. What are the signs or symptoms? Symptoms of this condition include:  Weakness.  Constipation.  Fatigue.  Muscle cramps.  Mental confusion.  Skipped heartbeats or irregular heartbeat (palpitations).  Tingling or numbness. How is this diagnosed? This condition is diagnosed with a blood test. How is this treated? This condition may be treated by:  Taking potassium supplements by mouth.  Adjusting the medicines that you take.  Eating more foods that contain a lot of potassium. If your potassium level is very low, you may need to get potassium through an IV and be monitored in the hospital. Follow these instructions at home:   Take over-the-counter and prescription medicines only as told by your health care provider. This includes vitamins and supplements.  Eat a healthy diet. A healthy diet includes fresh fruits and vegetables, whole grains, healthy fats, and lean proteins.  If instructed, eat more foods that contain a lot of potassium. This includes: ? Nuts, such as peanuts and  pistachios. ? Seeds, such as sunflower seeds and pumpkin seeds. ? Peas, lentils, and lima beans. ? Whole grain and bran cereals and breads. ? Fresh fruits and vegetables, such as apricots, avocado, bananas, cantaloupe, kiwi, oranges, tomatoes, asparagus, and potatoes. ? Orange juice. ? Tomato juice. ? Red meats. ? Yogurt.  Keep all follow-up visits as told by your health care provider. This is important. Contact a health care provider if you:  Have weakness that gets worse.  Feel your heart pounding or racing.  Vomit.  Have diarrhea.  Have diabetes (diabetes mellitus) and you have trouble keeping your blood sugar (glucose) in your target range. Get help right away if you:  Have chest pain.  Have shortness of breath.  Have vomiting or diarrhea that lasts for more than 2 days.  Faint. Summary  Hypokalemia means that the amount of potassium in the blood is lower than normal.  This condition is diagnosed with a blood test.  Hypokalemia may be treated by taking potassium supplements, adjusting the medicines that you take, or eating more foods that are high in potassium.  If your potassium level is very low, you may need to get potassium through an IV and be monitored in the hospital. This information is not intended to replace advice given to you by your health care provider. Make sure you discuss any questions you have with your health care provider. Document Revised: 07/10/2018 Document Reviewed: 07/10/2018 Elsevier Patient Education  [place].  Hypokalemia Hypokalemia means that the amount of potassium in the blood is lower than normal. Potassium is a chemical (electrolyte) that helps regulate the amount of fluid  in the body. It also stimulates muscle tightening (contraction) and helps nerves work properly. Normally, most of the body's potassium is inside cells, and only a very small amount is in the blood. Because the amount in the blood is so small, minor  changes to potassium levels in the blood can be life-threatening. What are the causes? This condition may be caused by:  Antibiotic medicine.  Diarrhea or vomiting. Taking too much of a medicine that helps you have a bowel movement (laxative) can cause diarrhea and lead to hypokalemia.  Chronic kidney disease (CKD).  Medicines that help the body get rid of excess fluid (diuretics).  Eating disorders, such as bulimia.  Low magnesium levels in the body.  Sweating a lot. What are the signs or symptoms? Symptoms of this condition include:  Weakness.  Constipation.  Fatigue.  Muscle cramps.  Mental confusion.  Skipped heartbeats or irregular heartbeat (palpitations).  Tingling or numbness. How is this diagnosed? This condition is diagnosed with a blood test. How is this treated? This condition may be treated by:  Taking potassium supplements by mouth.  Adjusting the medicines that you take.  Eating more foods that contain a lot of potassium. If your potassium level is very low, you may need to get potassium through an IV and be monitored in the hospital. Follow these instructions at home:   Take over-the-counter and prescription medicines only as told by your health care provider. This includes vitamins and supplements.  Eat a healthy diet. A healthy diet includes fresh fruits and vegetables, whole grains, healthy fats, and lean proteins.  If instructed, eat more foods that contain a lot of potassium. This includes: ? Nuts, such as peanuts and pistachios. ? Seeds, such as sunflower seeds and pumpkin seeds. ? Peas, lentils, and lima beans. ? Whole grain and bran cereals and breads. ? Fresh fruits and vegetables, such as apricots, avocado, bananas, cantaloupe, kiwi, oranges, tomatoes, asparagus, and potatoes. ? Orange juice. ? Tomato juice. ? Red meats. ? Yogurt.  Keep all follow-up visits as told by your health care provider. This is important. Contact a  health care provider if you:  Have weakness that gets worse.  Feel your heart pounding or racing.  Vomit.  Have diarrhea.  Have diabetes (diabetes mellitus) and you have trouble keeping your blood sugar (glucose) in your target range. Get help right away if you:  Have chest pain.  Have shortness of breath.  Have vomiting or diarrhea that lasts for more than 2 days.  Faint. Summary  Hypokalemia means that the amount of potassium in the blood is lower than normal.  This condition is diagnosed with a blood test.  Hypokalemia may be treated by taking potassium supplements, adjusting the medicines that you take, or eating more foods that are high in potassium.  If your potassium level is very low, you may need to get potassium through an IV and be monitored in the hospital. This information is not intended to replace advice given to you by your health care provider. Make sure you discuss any questions you have with your health care provider. Document Revised: 07/10/2018 Document Reviewed: 07/10/2018 Elsevier Patient Education  [place].

## 2020-02-16 ENCOUNTER — Telehealth: Payer: Self-pay | Admitting: Family Medicine

## 2020-02-16 LAB — ANTI-NUCLEAR AB-TITER (ANA TITER): ANA Titer 1: 1:40 {titer} — ABNORMAL HIGH

## 2020-02-16 LAB — ANA: Anti Nuclear Antibody (ANA): POSITIVE — AB

## 2020-02-16 NOTE — Telephone Encounter (Signed)
There is a lot to go through; I sent result note to set up visit to review so we have time to go over everything.

## 2020-02-16 NOTE — Telephone Encounter (Signed)
Pt would like a call about her lab results. Pt can be reached at [phone]

## 2020-02-17 NOTE — Telephone Encounter (Signed)
Spoke with pt and she states that she checked with her insurance and wants to go ahead and get scheduled for lung screening and get CT done at [place]. PT scheduled 03/08/20 3:30 for [facility]. CT will be ordered and scheduled. Nothing further needed at this time.

## 2020-02-17 NOTE — Telephone Encounter (Signed)
See results note. 

## 2020-02-20 ENCOUNTER — Institutional Professional Consult (permissible substitution): Payer: 59 | Admitting: Internal Medicine

## 2020-02-20 ENCOUNTER — Other Ambulatory Visit: Payer: Self-pay

## 2020-02-20 ENCOUNTER — Telehealth (INDEPENDENT_AMBULATORY_CARE_PROVIDER_SITE_OTHER): Payer: 59 | Admitting: Family Medicine

## 2020-02-20 ENCOUNTER — Encounter: Payer: Self-pay | Admitting: Family Medicine

## 2020-02-20 VITALS — BP 138/77

## 2020-02-20 DIAGNOSIS — E1169 Type 2 diabetes mellitus with other specified complication: Secondary | ICD-10-CM

## 2020-02-20 DIAGNOSIS — E1165 Type 2 diabetes mellitus with hyperglycemia: Secondary | ICD-10-CM

## 2020-02-20 DIAGNOSIS — E785 Hyperlipidemia, unspecified: Secondary | ICD-10-CM

## 2020-02-20 DIAGNOSIS — I10 Essential (primary) hypertension: Secondary | ICD-10-CM

## 2020-02-20 DIAGNOSIS — K760 Fatty (change of) liver, not elsewhere classified: Secondary | ICD-10-CM

## 2020-02-20 DIAGNOSIS — E119 Type 2 diabetes mellitus without complications: Secondary | ICD-10-CM | POA: Insufficient documentation

## 2020-02-20 MED ORDER — METFORMIN HCL 500 MG PO TABS: 500.0000 mg | ORAL_TABLET | Freq: Two times a day (BID) | ORAL | 1 refills | Status: DC

## 2020-02-20 MED ORDER — VENLAFAXINE HCL ER 37.5 MG PO CP24: 37.5000 mg | ORAL_CAPSULE | Freq: Every day | ORAL | 1 refills | Status: DC

## 2020-02-20 MED ORDER — PRAVASTATIN SODIUM 20 MG PO TABS: 20.0000 mg | ORAL_TABLET | Freq: Every day | ORAL | 1 refills | Status: DC

## 2020-02-20 NOTE — Progress Notes (Signed)
Virtual Visit via Video Note  I connected with Lori [name]  on 02/20/20 at 12:00 PM EST by a video enabled telemedicine application and verified that I am speaking with the correct person using two identifiers.  Location patient: home Location provider:work or home office Persons participating in the virtual visit: patient, provider  I discussed the limitations of evaluation and management by telemedicine and the availability of in person appointments. The patient expressed understanding and agreed to proceed.   Lori [name] DOB: November 23, 1966 Encounter date: 02/20/2020  This is a 54 y.o. female who presents with Chief Complaint  Patient presents with  . Follow-up    discuss lab results    History of present illness: Asked patient to return to discuss lab results due to multiple concerns:  *Blood sugar was borderline needed with A1c at 7.  Diabetes is a new diagnosis for her. *Cholesterol is elevated with LDL at 169. *Microalbumin is elevated *Mild sed rate elevation at 36 *Liver enzyme elevation slightly increased AST and ALT, but fairly stable for the last 1-1/2.  She has had evaluation for this through GI and elevations were thought to be secondary to fatty liver after blood work and ultrasound.   bp in morning 174/90; then later in 134/74 afternoon. Yesterday feeling woozy and had hot flash, started sweating. bp was 113/70, HR was 117. 140/78, 157/80 since then (taking it in morning); 134/74, 157/78, 144/73, 133/77, 180/80 (these numbers are before she takes medication).    Still feels like she is not peeing much, but is drinking more water.   Sugars: 175, 160 fasting; 170 preprandial dinner. Yesterday noon 135. 175/160 in morning. 169, 176 fasting; 133 (preprandial dinner); 188 in afternoon. 179, 147, 184. 112 (first one checked).    Allergies  Allergen Reactions  . Amoxicillin Nausea Only    Yeast Infection Has patient had a PCN reaction causing immediate rash,  facial/tongue/throat swelling, SOB or lightheadedness with hypotension: no Has patient had a PCN reaction causing severe rash involving mucus membranes or skin necrosis: no Has patient had a PCN reaction that required hospitalization yes Has patient had a PCN reaction occurring within the last 10 years: yes If all of the above answers are "NO", then may proceed with Cephalosporin use.  Yeast Infection Has patient had a PCN reaction causing immediate rash, facial/tongue/throat swelling, SOB or lightheadedness with hypotension: no Has patient had a PCN reaction causing severe rash involving mucus membranes or skin necrosis: no Has patient had a PCN reaction that required hospitalization yes Has patient had a PCN reaction occurring within the last 10 years: yes If all of the above answers are "NO", then may proceed with Cephalosporin use.  Marland Kitchen Morphine Other (See Comments)    REACTION: hyper REACTION: hyper  . Thiethylperazine Other (See Comments)  . Latex Rash   Current Meds  Medication Sig  . Biotin w/ Vitamins C & E (HAIR/SKIN/NAILS PO) Take 1 tablet by mouth every morning.  . blood glucose meter kit and supplies KIT Dispense based on patient and insurance preference. Use up to four times daily as directed. (FOR ICD-9 250.00, 250.01).  Marland Kitchen buPROPion (WELLBUTRIN SR) 150 MG 12 hr tablet Take 1 tablet (150 mg total) by mouth 2 (two) times daily.  . cetirizine (ZYRTEC) 10 MG tablet Take 10 mg by mouth at bedtime.  . Cholecalciferol (VITAMIN D3 PO) Take 1,000 Units by mouth daily.  . diazepam (VALIUM) 5 MG tablet TAKE 1 TABLET BY MOUTH EVERY 12 HOURS  AS NEEDED FOR ANXIETY OR MUSCLE SPASMS  . fluticasone (FLONASE) 50 MCG/ACT nasal spray Place 2 sprays into both nostrils daily.  . hydrochlorothiazide (HYDRODIURIL) 25 MG tablet Take 1 tablet (25 mg total) by mouth daily.  Marland Kitchen ibuprofen (ADVIL) 200 MG tablet Take 800 mg by mouth as needed.  Marland Kitchen losartan (COZAAR) 50 MG tablet Take 1 tablet (50 mg total)  by mouth daily.  . Multiple Vitamins-Minerals (EMERGEN-C IMMUNE PO) Take 1 tablet by mouth daily. With zinc  . Oxycodone HCl 10 MG TABS Take 10 mg by mouth 5 (five) times daily as needed.  . [DISCONTINUED] venlafaxine XR (EFFEXOR XR) 75 MG 24 hr capsule Take 1 capsule (75 mg total) by mouth daily with breakfast.   Current Facility-Administered Medications for the 02/20/20 encounter (Telemedicine) with Caren Macadam, MD  Medication  . 0.9 %  sodium chloride infusion    Review of Systems  Constitutional: Negative for chills, fatigue and fever.  Respiratory: Negative for cough, chest tightness, shortness of breath and wheezing.   Cardiovascular: Negative for chest pain, palpitations and leg swelling.    Objective:  BP 138/77 Comment: first reading at 8:10--174/90 at 5am-jaf  LMP 09/27/2016 (Approximate)       BP Readings from Last 3 Encounters:  02/20/20 138/77  02/13/20 138/90  02/03/20 (!) 152/84   Wt Readings from Last 3 Encounters:  02/13/20 236 lb 14.4 oz (107.5 kg)  02/03/20 238 lb 3.2 oz (108 kg)  02/03/20 236 lb (107 kg)    EXAM:  GENERAL: alert, oriented, appears well and in no acute distress  HEENT: atraumatic, conjunctiva clear, no obvious abnormalities on inspection of external nose and ears  NECK: normal movements of the head and neck  LUNGS: on inspection no signs of respiratory distress, breathing rate appears normal, no obvious gross SOB, gasping or wheezing  CV: no obvious cyanosis  MS: moves all visible extremities without noticeable abnormality  PSYCH/NEURO: pleasant and cooperative, no obvious depression or anxiety, speech and thought processing grossly intact  Assessment/Plan  1. Controlled type 2 diabetes mellitus with hyperglycemia, without long-term current use of insulin ([place]) We discussed that although diabetes is controlled, her A1c has climbed.  We discussed importance of low carbohydrate diet and with you very specific details with  food selection.  Some of this is covered at her last visit as well.  She is elected to start Metformin to help with insulin resistance.  We will plan to touch base again in 1 month's time, as she is really looking to lose weight and improve overall health.  She is also going to meet with a dietitian to help with meal planning and healthier eating.  Her fianc is the one who cooks most of the meals, so I encouraged her to bring him to this appointment. - metFORMIN (GLUCOPHAGE) 500 MG tablet; Take 1 tablet (500 mg total) by mouth 2 (two) times daily with a meal. Start with one tablet daily and increase to twice daily as tolerated.  Dispense: 180 tablet; Refill: 1 - Amb ref to Medical Nutrition Therapy-MNT  2. Hepatic steatosis She has been evaluated by GI, and liver enzyme elevations were thought to be secondary to fatty liver.  We discussed with weight loss these numbers can improve.  We will continue to monitor.  3. Hyperlipidemia associated with type 2 diabetes mellitus ([place]) We discussed cholesterol goals with diabetes.  Would like her LDL to be significantly lower.  We discussed benefits of higher intensity statin, but due  to concern of side effect we are going to start with the pravastatin since this is typically better tolerated. - pravastatin (PRAVACHOL) 20 MG tablet; Take 1 tablet (20 mg total) by mouth at bedtime.  Dispense: 90 tablet; Refill: 1  4.  Hypertension She is having some issues with hypotension in the afternoon but is hypertensive in the morning.  I am going to have her switch her losartan tonight and take her hydrochlorothiazide in the morning to see if this helps with more stability and blood pressure.  We will have her follow-up in 1 month to recheck. Switch losartan to night and hctz to morning.   Return in about 1 month (around 03/22/2020).   I discussed the assessment and treatment plan with the patient. The patient was provided an opportunity to ask questions and all were  answered. The patient agreed with the plan and demonstrated an understanding of the instructions.   The patient was advised to call back or seek an in-person evaluation if the symptoms worsen or if the condition fails to improve as anticipated.  I provided 20 minutes of non-face-to-face time during this encounter.   Micheline Rough, MD

## 2020-02-24 ENCOUNTER — Telehealth: Payer: Self-pay | Admitting: *Deleted

## 2020-02-24 NOTE — Telephone Encounter (Signed)
Left a detailed message at the pts cell number to call back for an appt as below.

## 2020-02-24 NOTE — Telephone Encounter (Signed)
-----   Message from Caren Macadam, MD sent at 02/20/2020 12:26 PM EST ----- Please set up 1 month follow up

## 2020-03-04 ENCOUNTER — Ambulatory Visit (INDEPENDENT_AMBULATORY_CARE_PROVIDER_SITE_OTHER): Payer: 59

## 2020-03-04 ENCOUNTER — Other Ambulatory Visit: Payer: Self-pay

## 2020-03-04 ENCOUNTER — Ambulatory Visit: Payer: 59 | Admitting: Family Medicine

## 2020-03-04 VITALS — BP 110/72 | Ht 65.0 in | Wt 230.4 lb

## 2020-03-04 DIAGNOSIS — M25551 Pain in right hip: Secondary | ICD-10-CM

## 2020-03-04 NOTE — Patient Instructions (Addendum)
Thank you for coming in today.  Plan for a little PT.  If not improving we will do a diagnostic and hopefully theraputic injection in the hip.   Let me know if you have trouble with scheduling PT.   Keep me updated.

## 2020-03-04 NOTE — Progress Notes (Addendum)
I, [name], LAT, ATC, am serving as scribe for Dr. [name].  Lori [name] is a 54 y.o. female who presents to [facility] today for a new issue of R thigh pain from her groin to her knee.  She was last seen by Dr. [name] on 01/22/20 for f/u of neck, R shoulder and B knee pain.  Since her last visit, pt reports that she returned back to work in February 2021 and has started climbing more stairs.  She's not sure if that might be contributing to her pain.  She has soreness at rest and rates her pain at an 8/10 at it's worst.  She feels like her R hip locks and is unable to sit in a FABER position.  Aggravating factors include ascending stairs and trying to get into a FABER position when seated.  She denies any mechanical symptoms.  She con't to take her oxycodone prescribed by pain management.  She has tried ice, heat and tylenol arthritis.  Ice helps more than heat.   Pertinent review of systems: No fevers or chills  Relevant historical information: Hypertension history chronic back and neck pain.  Diabetes.   Exam:  BP 110/72 (BP Location: Right Arm, Patient Position: Sitting, Cuff Size: Large)   Ht 5\' 5"  (1.651 m)   Wt 230 lb 6.4 oz (104.5 kg)   LMP 09/27/2016 (Approximate)   BMI 38.34 kg/m  General: Well Developed, well nourished, and in no acute distress.   MSK: Right hip normal-appearing Decreased range of motion to flexion and internal rotation. Strength intact but resisted straight leg raise is somewhat painful. Pelvis is nontender to palpation overlying anterior hip.    Lab and Radiology Results  X-ray images right hip obtained today personally independent reviewed. Minimal degenerative changes no acute fractures or severe changes. Await formal radiology review    Assessment and Plan: 54 y.o. female with right hip pain.  No acute injury to explain pain.  He has been moving more which may be exacerbating some unknown issue in her  hip.  X-ray looked pretty benign today.  Differential includes hip flexor strain, L1 or L2 radiculopathy, or labrum tear. Plan for brief trial of physical therapy.  If not improving patient will return to clinic.  Likely would pursue diagnostic and therapeutic injection.   Addendum: Patient called back she would like to do physical therapy at [facility] in [facility].  PDMP not reviewed this encounter. Orders Placed This Encounter  Procedures  . DG HIP UNILAT W OR W/O PELVIS 2-3 VIEWS RIGHT    Standing Status:   Future    Number of Occurrences:   1    Standing Expiration Date:   05/04/2021    Order Specific Question:   Reason for Exam (SYMPTOM  OR DIAGNOSIS REQUIRED)    Answer:   R hip pain    Order Specific Question:   Is patient pregnant?    Answer:   No    Order Specific Question:   Preferred imaging location?    Answer:   [facility]  . Ambulatory referral to Physical Therapy    Referral Priority:   Routine    Referral Type:   Physical Medicine    Referral Reason:   Specialty Services Required    Requested Specialty:   Physical Therapy   No orders of the defined types were placed in this encounter.   We will send a copy of this  letter to PCP as well as Dr.Bodea at [facility] [place], [place] [address] Main: [phone] Fax: [phone]  Discussed warning signs or symptoms. Please see discharge instructions. Patient expresses understanding.   The above documentation has been reviewed and is accurate and complete [name]

## 2020-03-05 NOTE — Addendum Note (Signed)
Addended by: Gregor Hams on: 03/05/2020 01:26 PM   Modules accepted: Orders

## 2020-03-05 NOTE — Progress Notes (Signed)
Right hip x-ray looks pretty normal.

## 2020-03-08 ENCOUNTER — Encounter: Payer: Self-pay | Admitting: Acute Care

## 2020-03-08 ENCOUNTER — Other Ambulatory Visit: Payer: Self-pay

## 2020-03-08 ENCOUNTER — Ambulatory Visit: Payer: 59 | Admitting: Acute Care

## 2020-03-08 ENCOUNTER — Ambulatory Visit
Admission: RE | Admit: 2020-03-08 | Discharge: 2020-03-08 | Disposition: A | Discharge status: Home / Self Care | Payer: 59 | Source: Ambulatory Visit | Attending: Acute Care | Admitting: Acute Care

## 2020-03-08 VITALS — BP 126/72 | HR 65 | Temp 97.6°F | Ht 65.0 in | Wt 230.0 lb

## 2020-03-08 DIAGNOSIS — F17210 Nicotine dependence, cigarettes, uncomplicated: Secondary | ICD-10-CM

## 2020-03-08 DIAGNOSIS — F17200 Nicotine dependence, unspecified, uncomplicated: Secondary | ICD-10-CM

## 2020-03-08 NOTE — Assessment & Plan Note (Signed)
First Lung Cancer Screening 02/2020

## 2020-03-08 NOTE — Patient Instructions (Signed)
Thank you for participating in the [facility] Cancer Screening Program. It was our pleasure to meet you today. We will call you with the results of your scan within the next few days. Your scan will be assigned a Lung RADS category score by the physicians reading the scans.  This Lung RADS score determines follow up scanning.  See below for description of categories, and follow up screening recommendations. We will be in touch to schedule your follow up screening annually or based on recommendations of our providers. We will fax a copy of your scan results to your Primary Care Physician, or the physician who referred you to the program, to ensure they have the results. Please call the office if you have any questions or concerns regarding your scanning experience or results.  Our office number is [phone]. Please speak with Denise Phelps, RN. She is our Lung Cancer Screening RN. If she is unavailable when you call, please have the office staff send her a message. She will return your call at her earliest convenience. Remember, if your scan is normal, we will scan you annually as long as you continue to meet the criteria for the program. (Age 55-77, Current smoker or smoker who has quit within the last 15 years). If you are a smoker, remember, quitting is the single most powerful action that you can take to decrease your risk of lung cancer and other pulmonary, breathing related problems. We know quitting is hard, and we are here to help.  Please let us know if there is anything we can do to help you meet your goal of quitting. If you are a former smoker, congratulations. We are proud of you! Remain smoke free! Remember you can refer friends or family members through the number above.  We will screen them to make sure they meet criteria for the program. Thank you for helping us take better care of you by participating in Lung Screening.  Lung RADS Categories:  Lung RADS 1: no nodules  or definitely non-concerning nodules.  Recommendation is for a repeat annual scan in 12 months.  Lung RADS 2:  nodules that are non-concerning in appearance and behavior with a very low likelihood of becoming an active cancer. Recommendation is for a repeat annual scan in 12 months.  Lung RADS 3: nodules that are probably non-concerning , includes nodules with a low likelihood of becoming an active cancer.  Recommendation is for a 6-month repeat screening scan. Often noted after an upper respiratory illness. We will be in touch to make sure you have no questions, and to schedule your 6-month scan.  Lung RADS 4 A: nodules with concerning findings, recommendation is most often for a follow up scan in 3 months or additional testing based on our provider's assessment of the scan. We will be in touch to make sure you have no questions and to schedule the recommended 3 month follow up scan.  Lung RADS 4 B:  indicates findings that are concerning. We will be in touch with you to schedule additional diagnostic testing based on our provider's  assessment of the scan.   

## 2020-03-08 NOTE — Progress Notes (Signed)
Shared Decision Making Visit Lung Cancer Screening Program [phone])   Eligibility:  Age 54 y.o.  Pack Years Smoking History Calculation 36 packs per year (# packs/per year x # years smoked)  Recent History of coughing up blood  no  Unexplained weight loss? no ( >Than 15 pounds within the last 6 months )  Prior History Lung / other cancer no (Diagnosis within the last 5 years already requiring surveillance chest CT Scans).  Smoking Status Current Smoker  Former Smokers: Years since quit: NA  Quit Date: NA  Visit Components:  Discussion included one or more decision making aids. yes  Discussion included risk/benefits of screening. yes  Discussion included potential follow up diagnostic testing for abnormal scans. yes  Discussion included meaning and risk of over diagnosis. yes  Discussion included meaning and risk of False Positives. yes  Discussion included meaning of total radiation exposure. yes  Counseling Included:  Importance of adherence to annual lung cancer LDCT screening. yes  Impact of comorbidities on ability to participate in the program. yes  Ability and willingness to under diagnostic treatment. yes  Smoking Cessation Counseling:  Current Smokers:   Discussed importance of smoking cessation. yes  Information about tobacco cessation classes and interventions provided to patient. yes  Patient provided with "ticket" for LDCT Scan. yes  Symptomatic Patient. no  Counseling NA   Diagnosis Code: Tobacco Use Z72.0  Asymptomatic Patient yes  Counseling (Intermediate counseling: > three minutes counseling) [id number]  Former Smokers:   Discussed the importance of maintaining cigarette abstinence. yes  Diagnosis Code: Personal History of Nicotine Dependence. [id number]  Information about tobacco cessation classes and interventions provided to patient. Yes  Patient provided with "ticket" for LDCT Scan. yes  Written Order for Lung Cancer Screening with  LDCT placed in Epic. Yes (CT Chest Lung Cancer Screening Low Dose W/O CM) [id number] Z12.2-Screening of respiratory organs Z87.891-Personal history of nicotine dependence  I have spent 25 minutes of face to face time with [name] discussing the risks and benefits of lung cancer screening. We viewed a power point together that explained in detail the above noted topics. We paused at intervals to allow for questions to be asked and answered to ensure understanding.We discussed that the single most powerful action that she can take to decrease her risk of developing lung cancer is to quit smoking. We discussed whether or not she is ready to commit to setting a quit date. We discussed options for tools to aid in quitting smoking including nicotine replacement therapy, non-nicotine medications, support groups, Quit Smart classes, and behavior modification. We discussed that often times setting smaller, more achievable goals, such as eliminating 1 cigarette a day for a week and then 2 cigarettes a day for a week can be helpful in slowly decreasing the number of cigarettes smoked. This allows for a sense of accomplishment as well as providing a clinical benefit. I gave her the " Be Stronger Than Your Excuses" card with contact information for community resources, classes, free nicotine replacement therapy, and access to mobile apps, text messaging, and on-line smoking cessation help. I have also given her my card and contact information in the event she needs to contact me. We discussed the time and location of the scan, and that either Doroteo Glassman RN or I will call with the results within 24-48 hours of receiving them. I have offered her  a copy of the power point we viewed  as a resource in the event they need reinforcement  of the concepts we discussed today in the office. The patient verbalized understanding of all of  the above and had no further questions upon leaving the office. They have my contact information  in the event they have any further questions.  I spent 4 minutes counseling on smoking cessation and the health risks of continued tobacco abuse.  I explained to the patient that there has been a high incidence of coronary artery disease noted on these exams. I explained that this is a non-gated exam therefore degree or severity cannot be determined. This patient is currently on statin therapy. I have asked the patient to follow-up with their PCP regarding any incidental finding of coronary artery disease and management with diet or medication as their PCP  feels is clinically indicated. The patient verbalized understanding of the above and had no further questions upon completion of the visit.      Magdalen Spatz, NP 03/08/2020 3:42 PM

## 2020-03-09 ENCOUNTER — Other Ambulatory Visit: Payer: Self-pay | Admitting: Family Medicine

## 2020-03-11 NOTE — Progress Notes (Signed)

## 2020-03-15 ENCOUNTER — Other Ambulatory Visit: Payer: Self-pay | Admitting: *Deleted

## 2020-03-15 DIAGNOSIS — F17210 Nicotine dependence, cigarettes, uncomplicated: Secondary | ICD-10-CM

## 2020-03-15 DIAGNOSIS — Z87891 Personal history of nicotine dependence: Secondary | ICD-10-CM

## 2020-03-23 ENCOUNTER — Encounter: Payer: 59 | Attending: Family Medicine | Admitting: Registered"

## 2020-03-23 ENCOUNTER — Other Ambulatory Visit: Payer: Self-pay

## 2020-03-23 ENCOUNTER — Encounter: Payer: Self-pay | Admitting: Registered"

## 2020-03-23 DIAGNOSIS — E1165 Type 2 diabetes mellitus with hyperglycemia: Secondary | ICD-10-CM | POA: Insufficient documentation

## 2020-03-23 NOTE — Progress Notes (Signed)
Diabetes Self-Management Education  Visit Type: First/Initial  Appt. Start Time: 1420 Appt. End Time: [id number]  03/26/2020  [name], identified by name and date of birth, is a 54 y.o. female with a diagnosis of Diabetes: Type 2.   ASSESSMENT  Last menstrual period 09/27/2016. There is no height or weight on file to calculate BMI.  Patient states she has cut back on beef, soda, potatoes, rice, bread. Now mostly eats chicken for protein and has increased vegetables. Pt states she gets shaky in the morning unless she has meat for breakfast.  Pt states she does not eat eggs or fish due to smell and texture.  FBS: 146 mg/dL; post prandial 107 mg/dL  Pt states she is having a lot of swelling lately. Pt states she is trying to cut back on sodium and interested in other seasoning ideas.  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   Pt states she [place] farm, they raised 1 pig per year and after the meat was gone they mostly had biscuits and gravy, some fresh vegetables. Pt states grew up having to 'clean her plate.'   Diabetes Self-Management Education - 03/23/20 1446      Visit Information   Visit Type  First/Initial      Initial Visit   Diabetes Type  Type 2    Are you currently following a meal plan?  Yes   reduced carbs   What type of meal plan do you follow?  less carbohydrates    Are you taking your medications as prescribed?  Yes   Metformin 500 BID   Date Diagnosed  2021      Health Coping   How would you rate your overall health?  Fair      Psychosocial Assessment   Patient Belief/Attitude about Diabetes  Motivated to manage diabetes    How often do you need to have someone help you when you read instructions,  pamphlets, or other written materials from your doctor or pharmacy?  1 - Never    What is the last grade level you completed in school?  some college      Complications   Last HgB A1C per patient/outside source  7 %   02/13/20   How often do you check your blood sugar?  Patient declines    Fasting Blood glucose range (mg/dL)  130-179    Postprandial Blood glucose range (mg/dL)  70-129    Have you had a dilated eye exam in the past 12 months?  No    Have you had a dental exam in the past 12 months?  Yes    Are you checking your feet?  Yes    How many days per week are you checking your feet?  7      Dietary Intake   Breakfast  toast, bacon OR activia yogurt, banana    Snack (morning)  none    Lunch  none if working OR salad    Snack (afternoon)  grapefruit or yogurt OR    Dinner  chicken, vegetable, air fryer fries    Snack (evening)  carb smart ice cream    Beverage(s)  coffee with sugar-free creamer, 100 oz  water, sugarfree twist      Exercise   Exercise Type  ADL's    How many days per week to you exercise?  0    How many minutes per day do you exercise?  0    Total minutes per week of exercise  0      Patient Education   Previous Diabetes Education  No    Nutrition management   Role of diet in the treatment of diabetes and the relationship between the three main macronutrients and blood glucose level;Carbohydrate counting;Food label reading, portion sizes and measuring food.    Physical activity and exercise   Role of exercise on diabetes management, blood pressure control and cardiac health.    Monitoring  Identified appropriate SMBG and/or A1C goals.    Psychosocial adjustment  Role of stress on diabetes      Individualized Goals (developed by patient)   Nutrition  General guidelines for healthy choices and portions discussed    Physical Activity  Exercise 3-5 times per week    Reducing Risk  Other (comment)   reduce sodium intake     Outcomes   Expected Outcomes   Demonstrated interest in learning. Expect positive outcomes    Future DMSE  4-6 wks    Program Status  Not Completed       Individualized Plan for Diabetes Self-Management Training:   Learning Objective:  Patient will have a greater understanding of diabetes self-management. Patient education plan is to attend individual and/or group sessions per assessed needs and concerns.    Patient Instructions  Aim to eat balanced meals and snacks paying attention to where the carbohydrates are coming from in your diet. Consider doing chair exercises for more activity. Consider trying seasoning ideas to help reduce sodium in your diet Consider reading labels for Total carbohydrates and sodium content    Expected Outcomes:  Demonstrated interest in learning. Expect positive outcomes  Education material provided: Food label handouts, Carbohydrate counting sheet and No sodium seasonings, snack sheet, Planning healthy meals  If problems or questions, patient to contact team via:  Phone and MyChart  Future DSME appointment: 4-6 wks

## 2020-03-24 ENCOUNTER — Telehealth: Payer: Self-pay | Admitting: Family Medicine

## 2020-03-24 DIAGNOSIS — M25551 Pain in right hip: Secondary | ICD-10-CM

## 2020-03-24 NOTE — Telephone Encounter (Signed)
Patient called stating that she has been doing physical therapy for 2 weeks but she has noticed continued discomfort in her hip and not much of an improvement in her range of motion.  She asked if Dr [name] would be willing to go ahead and order the MRI for her. She would like clarification on if it is her hip or knee or what could be causing the problem. She mentioned that she has already met her deductible for the year.  Please advise.

## 2020-03-25 NOTE — Telephone Encounter (Signed)
Next step is probably to do a hip injection.  Recommend return to clinic for diagnostic hip injection

## 2020-03-25 NOTE — Telephone Encounter (Signed)
Called the patient and left a message asking for call back.  If she really wants an MRI prior to seeing me we may want to do an MRI as an MRI arthrogram.  That way she can have the injection and the appropriate MRI at the same time.  However this will require some coordination.  Advised patient to call back.  I am happy to schedule a time to talk with her on the phone at a time convenient with her.  I will be free Friday around lunchtime to have this conversation if she would like.  Happy to talk to her on the phone if she calls back and I have a few minutes to talk.  Lori [name]

## 2020-03-25 NOTE — Telephone Encounter (Signed)
Called Lori [name] and have her scheduled for a f/u visit for an injection on 04/02/20.  Lori [name] asks if we can go ahead and put an order in for an MRI.  I advise Lori [name] that I will ask Dr. [name] and he will order if appropriate.

## 2020-03-25 NOTE — Telephone Encounter (Signed)
Debbie called back.  She notes her pain is worsening with physical therapy.  She is now having some mechanical symptoms.  Plan for MRI arthrogram.  Recommend proceeding with steroid injection along with the arthrogram.  We will performed test at [facility].  My colleague Dr. Dianah Field will perform the arthrogram injection prior to MRI.  Patient expresses understanding and agreement.

## 2020-03-26 NOTE — Patient Instructions (Signed)
Aim to eat balanced meals and snacks paying attention to where the carbohydrates are coming from in your diet. Consider doing chair exercises for more activity. Consider trying seasoning ideas to help reduce sodium in your diet Consider reading labels for Total carbohydrates and sodium content

## 2020-03-31 ENCOUNTER — Ambulatory Visit (INDEPENDENT_AMBULATORY_CARE_PROVIDER_SITE_OTHER): Payer: 59 | Admitting: Family Medicine

## 2020-03-31 ENCOUNTER — Encounter: Payer: Self-pay | Admitting: Family Medicine

## 2020-03-31 ENCOUNTER — Other Ambulatory Visit: Payer: Self-pay

## 2020-03-31 VITALS — BP 118/78 | Temp 98.0°F | Ht 65.0 in | Wt 229.0 lb

## 2020-03-31 DIAGNOSIS — F17200 Nicotine dependence, unspecified, uncomplicated: Secondary | ICD-10-CM | POA: Diagnosis not present

## 2020-03-31 DIAGNOSIS — I10 Essential (primary) hypertension: Secondary | ICD-10-CM | POA: Diagnosis not present

## 2020-03-31 DIAGNOSIS — E1165 Type 2 diabetes mellitus with hyperglycemia: Secondary | ICD-10-CM

## 2020-03-31 DIAGNOSIS — N952 Postmenopausal atrophic vaginitis: Secondary | ICD-10-CM

## 2020-03-31 MED ORDER — PREMARIN 0.625 MG/GM VA CREA: TOPICAL_CREAM | VAGINAL | 0 refills | Status: DC

## 2020-03-31 NOTE — Progress Notes (Addendum)
Lori [name] DOB: 1966/06/08 Encounter date: 03/31/2020  This is a 54 y.o. female who presents with Chief Complaint  Patient presents with  . Hypertension    History of present illness: At last visit metformin started for new dx diabetes; working with healthy weight loss. Blood sugar has been in 110+; but started second metformin. Working on decreasing carbs. Still not getting as much exercise due to job being more sedentary. Steps hurts hip (see below)  HTN: was getting hypotensive in afternoon/higher in morning. We switched losartan to night and hctz to morning. This is working better for her through the day. bp has been pretty good.   About a month and a half ago started having right groin pain. And now to point where she can't lift up right leg. She followed up with Dr. [name] and was put back in PT. Now planned for injection and MRI right hip with contrast on 5/10 and having her see hip specialist.   Going back and forth from constipation to diarrhea. Making things more raw in vaginal area. Has been taking hot baths, which helps, but feels like this area is easy to re-rip. 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 Had full hysterectomy.    Allergies  Allergen Reactions  . Amoxicillin Nausea Only    Yeast Infection Has patient had a PCN reaction causing immediate rash, facial/tongue/throat swelling, SOB or lightheadedness with hypotension: no Has patient had a PCN reaction causing severe rash involving mucus membranes or skin necrosis: no Has patient had a PCN reaction that required hospitalization yes Has patient had a PCN reaction occurring within the last 10 years: yes If all of the above answers are "NO", then may proceed with Cephalosporin use.  Yeast Infection Has patient had a PCN reaction causing immediate rash, facial/tongue/throat swelling, SOB or lightheadedness with hypotension: no Has patient had a PCN reaction causing severe  rash involving mucus membranes or skin necrosis: no Has patient had a PCN reaction that required hospitalization yes Has patient had a PCN reaction occurring within the last 10 years: yes If all of the above answers are "NO", then may proceed with Cephalosporin use.  Marland Kitchen Morphine Other (See Comments)    REACTION: hyper REACTION: hyper  . Thiethylperazine Other (See Comments)  . Latex Rash   Current Meds  Medication Sig  . Biotin w/ Vitamins C & E (HAIR/SKIN/NAILS PO) Take 1 tablet by mouth every morning.  . blood glucose meter kit and supplies KIT Dispense based on patient and insurance preference. Use up to four times daily as directed. (FOR ICD-9 250.00, 250.01).  Marland Kitchen buPROPion (WELLBUTRIN SR) 150 MG 12 hr tablet Take 1 tablet (150 mg total) by mouth 2 (two) times daily.  . cetirizine (ZYRTEC) 10 MG tablet Take 10 mg by mouth at bedtime.  . Cholecalciferol (VITAMIN D3 PO) Take 1,000 Units by mouth daily.  . diazepam (VALIUM) 5 MG tablet TAKE 1 TABLET BY MOUTH EVERY 12 HOURS AS NEEDED FOR ANXIETY OR MUSCLE SPASMS  . fluticasone (FLONASE) 50 MCG/ACT nasal spray Place 2 sprays into both nostrils daily.  . hydrochlorothiazide (HYDRODIURIL) 25 MG tablet Take 1 tablet (25 mg total) by mouth daily.  Marland Kitchen ibuprofen (ADVIL) 200 MG tablet Take 800 mg by mouth as needed.  Marland Kitchen losartan (COZAAR) 50 MG tablet Take 1 tablet (50 mg total) by mouth daily.  . metFORMIN (GLUCOPHAGE) 500 MG tablet Take 1 tablet (500 mg total) by mouth 2 (  two) times daily with a meal. Start with one tablet daily and increase to twice daily as tolerated.  . Multiple Vitamins-Minerals (EMERGEN-C IMMUNE PO) Take 1 tablet by mouth daily. With zinc  . ONETOUCH ULTRA test strip USE TO TEST BLOOD SUGAR UP TO 4 TIMES A DAY  . Oxycodone HCl 10 MG TABS Take 10 mg by mouth 5 (five) times daily as needed.  . pravastatin (PRAVACHOL) 20 MG tablet Take 1 tablet (20 mg total) by mouth at bedtime.  Marland Kitchen venlafaxine XR (EFFEXOR XR) 37.5 MG 24 hr capsule  Take 1 capsule (37.5 mg total) by mouth daily with breakfast.   Current Facility-Administered Medications for the 03/31/20 encounter (Office Visit) with Caren Macadam, MD  Medication  . 0.9 %  sodium chloride infusion    Review of Systems  Constitutional: Negative for chills, fatigue and fever.  Respiratory: Negative for cough, chest tightness, shortness of breath and wheezing.   Cardiovascular: Negative for chest pain, palpitations and leg swelling.  Genitourinary: Positive for dyspareunia (sometimes noting more dryness) and vaginal pain. Negative for flank pain, frequency and hematuria. Dysuria: sometimes tender with urination.    Objective:  BP 118/78 (BP Location: Left Arm, Patient Position: Sitting, Cuff Size: Large)   Temp 98 F (36.7 C) (Temporal)   Ht _0  (1.651 m)   Wt 229 lb (103.9 kg)   LMP 09/27/2016 (Approximate)   BMI 38.11 kg/m   Weight: 229 lb (103.9 kg)   BP Readings from Last 3 Encounters:  03/31/20 118/78  03/08/20 126/72  03/04/20 110/72   Wt Readings from Last 3 Encounters:  03/31/20 229 lb (103.9 kg)  03/08/20 230 lb (104.3 kg)  03/04/20 230 lb 6.4 oz (104.5 kg)    Physical Exam Constitutional:      General: She is not in acute distress.    Appearance: She is well-developed.  HENT:     Head: Normocephalic and atraumatic.  Cardiovascular:     Rate and Rhythm: Normal rate and regular rhythm.     Heart sounds: Normal heart sounds. No murmur.  Pulmonary:     Effort: Pulmonary effort is normal.     Breath sounds: Normal breath sounds.  Abdominal:     General: Bowel sounds are normal. There is no distension.     Palpations: Abdomen is soft.     Tenderness: There is no abdominal tenderness. There is no guarding.  Genitourinary:      Comments: There is some slight erythema midline with thinning tissues. No lesions noted.  Skin:    General: Skin is warm and dry.     Comments: Sensory exam of the foot is normal, tested with the  monofilament. Good pulses, no lesions or ulcers, good peripheral pulses.  Psychiatric:        Judgment: Judgment normal.     Assessment/Plan 1. Controlled type 2 diabetes mellitus with hyperglycemia, without long-term current use of insulin ([place]) She is really working on diet to become healthier. She has lost weight and is motivated to continue. She is tolerating metformin well.   2. TOBACCO USE She has cut back to half pack. Goal is to quit completely in next month. Discussed cutting back to 2 most critical cigarettes in day and then stopping. She understands health benefits.   3. Morbid obesity ([place]) She is working with healthy weight loss clinic and has been losing weight. She is planning to keep working on incorporating exercise into daily regimen and feels she will be able to  start walking on daily basis.   4. Essential hypertension Improved control. Continue current medication dosing with split am/pm dosing.   5. Vaginal atrophy: she will try topical estrogen over course of next couple of weeks. We discussed using A and D or vasoline as well to help with irritation (easier at night when not sweating/friction); cotton underwear. We discussed limiting use and risks of hormone, but there are atrophic changes secondary to lack of hormone that should respond to low dose replacement.  Return in about 3 months (around 06/30/2020) for Chronic condition visit.    Micheline Rough, MD

## 2020-03-31 NOTE — Patient Instructions (Signed)
Consider replens to help with vaginal lubrication

## 2020-04-02 ENCOUNTER — Ambulatory Visit: Payer: 59 | Admitting: Family Medicine

## 2020-04-12 ENCOUNTER — Ambulatory Visit: Payer: 59 | Admitting: Sports Medicine

## 2020-04-18 ENCOUNTER — Other Ambulatory Visit: Payer: Self-pay | Admitting: Family Medicine

## 2020-04-19 ENCOUNTER — Ambulatory Visit (INDEPENDENT_AMBULATORY_CARE_PROVIDER_SITE_OTHER): Payer: 59

## 2020-04-19 ENCOUNTER — Other Ambulatory Visit: Payer: Self-pay

## 2020-04-19 ENCOUNTER — Ambulatory Visit (INDEPENDENT_AMBULATORY_CARE_PROVIDER_SITE_OTHER): Payer: 59 | Admitting: Sports Medicine

## 2020-04-19 DIAGNOSIS — G8929 Other chronic pain: Secondary | ICD-10-CM

## 2020-04-19 DIAGNOSIS — M25551 Pain in right hip: Secondary | ICD-10-CM | POA: Diagnosis not present

## 2020-04-19 MED ORDER — GADOBUTROL 1 MMOL/ML IV SOLN
1.0000 mL | Freq: Once | INTRAVENOUS | Status: AC | PRN
  Administered 2020-04-19: 1 mL via INTRAVENOUS

## 2020-04-19 NOTE — Progress Notes (Signed)
    Procedures performed today:    Procedure: Real-time Ultrasound Guided gadolinium contrast injection of right hip joint Device: Samsung HS60  Verbal informed consent obtained.  Time-out conducted.  Noted no overlying erythema, induration, or other signs of local infection.  Skin prepped in a sterile fashion.  Local anesthesia: Topical Ethyl chloride.  With sterile technique and under real time ultrasound guidance: I advanced a 22-gauge spinal needle to the femoral head/neck junction, contacted bone and injected 1 cc Kenalog 40, 2 cc lidocaine, 2 cc bupivacaine, syringe switched and 0.1 cc gadolinium injected, syringe again switched and 12 cc sterile saline used to distend the joint. Joint visualized and capsule seen distending confirming intra-articular placement of contrast material and medication. Completed without difficulty  Advised to call if fevers/chills, erythema, induration, drainage, or persistent bleeding.  Images permanently stored and available for review in the ultrasound unit.  Impression: Technically successful ultrasound guided gadolinium contrast injection for MR arthrography.  Please see separate MR arthrogram report.   Independent interpretation of notes and tests performed by another provider:   None.  Brief History, Exam, Impression, and Recommendations:    Chronic right hip pain This is a very pleasant 54 year old female referred to me by Dr. [name], she has had chronic right hip pain suspected to be a labral injury versus osteoarthritis. She is referred to me for the arthrogram injection, she has an MRI scheduled later today. I performed the MR arthrogram injection with steroid, further follow-up with Dr. [name].    ___________________________________________ Gwen Her. Dianah Field, M.D., ABFM., CAQSM. Primary Care and [place] Instructor of [employer] [facility]  [facility]

## 2020-04-19 NOTE — Assessment & Plan Note (Signed)
This is a very pleasant 54 year old female referred to me by Dr. [name], she has had chronic right hip pain suspected to be a labral injury versus osteoarthritis. She is referred to me for the arthrogram injection, she has an MRI scheduled later today. I performed the MR arthrogram injection with steroid, further follow-up with Dr. [name].

## 2020-04-22 ENCOUNTER — Encounter: Payer: Self-pay | Admitting: Family Medicine

## 2020-04-22 ENCOUNTER — Other Ambulatory Visit: Payer: Self-pay

## 2020-04-22 ENCOUNTER — Ambulatory Visit: Payer: 59 | Admitting: Family Medicine

## 2020-04-22 VITALS — BP 112/76 | HR 83 | Ht 65.0 in | Wt 226.0 lb

## 2020-04-22 DIAGNOSIS — S73191A Other sprain of right hip, initial encounter: Secondary | ICD-10-CM | POA: Diagnosis not present

## 2020-04-22 DIAGNOSIS — S32501A Unspecified fracture of right pubis, initial encounter for closed fracture: Secondary | ICD-10-CM | POA: Insufficient documentation

## 2020-04-22 DIAGNOSIS — S32591A Other specified fracture of right pubis, initial encounter for closed fracture: Secondary | ICD-10-CM

## 2020-04-22 MED ORDER — ALENDRONATE SODIUM 70 MG PO TABS: 70.0000 mg | ORAL_TABLET | ORAL | 11 refills | Status: DC

## 2020-04-22 MED ORDER — AMBULATORY NON FORMULARY MEDICATION: 0 refills | Status: DC

## 2020-04-22 MED ORDER — VITAMIN D (ERGOCALCIFEROL) 1.25 MG (50000 UNIT) PO CAPS: 50000.0000 [IU] | ORAL_CAPSULE | ORAL | 0 refills | Status: DC
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
## 2020-04-22 NOTE — Patient Instructions (Addendum)
Thank you for coming in today. Plan for offloading the leg.  Use crutches or a walker or both.   I will discuss the case with radiology and my partners.  I will get back to you about need for CT scan and potential orthopedic.   Send me an updated in 1-2 weeks.  Anticipate improvement with less weightbearing.

## 2020-04-26 ENCOUNTER — Ambulatory Visit: Payer: 59 | Admitting: Registered"

## 2020-05-05 ENCOUNTER — Other Ambulatory Visit: Payer: Self-pay | Admitting: Family Medicine

## 2020-05-05 DIAGNOSIS — I10 Essential (primary) hypertension: Secondary | ICD-10-CM

## 2020-05-11 ENCOUNTER — Encounter: Payer: Self-pay | Admitting: Family Medicine

## 2020-05-14 ENCOUNTER — Ambulatory Visit (INDEPENDENT_AMBULATORY_CARE_PROVIDER_SITE_OTHER)
Admission: RE | Admit: 2020-05-14 | Discharge: 2020-05-14 | Disposition: A | Discharge status: Home / Self Care | Payer: 59 | Source: Ambulatory Visit | Attending: Family Medicine | Admitting: Family Medicine

## 2020-05-14 ENCOUNTER — Other Ambulatory Visit: Payer: Self-pay

## 2020-05-14 DIAGNOSIS — S32591A Other specified fracture of right pubis, initial encounter for closed fracture: Secondary | ICD-10-CM

## 2020-05-17 ENCOUNTER — Encounter: Payer: Self-pay | Admitting: Family Medicine

## 2020-05-17 DIAGNOSIS — M810 Age-related osteoporosis without current pathological fracture: Secondary | ICD-10-CM | POA: Insufficient documentation

## 2020-05-17 HISTORY — DX: Age-related osteoporosis without current pathological fracture: M81.0

## 2020-05-17 NOTE — Progress Notes (Signed)
Bone density is low.  Middling to low bone density plus fracture equals osteoporosis.  This should be treated.  I could start treatment or your primary care provider could start treatment.  I will send this information to your primary care provider as well.

## 2020-05-20 ENCOUNTER — Encounter: Payer: Self-pay | Admitting: Family Medicine

## 2020-05-20 DIAGNOSIS — S32591A Other specified fracture of right pubis, initial encounter for closed fracture: Secondary | ICD-10-CM

## 2020-05-26 ENCOUNTER — Encounter: Payer: Self-pay | Admitting: Family Medicine

## 2020-05-27 ENCOUNTER — Other Ambulatory Visit: Payer: Self-pay

## 2020-05-27 ENCOUNTER — Ambulatory Visit (INDEPENDENT_AMBULATORY_CARE_PROVIDER_SITE_OTHER): Payer: 59

## 2020-05-27 DIAGNOSIS — S32591A Other specified fracture of right pubis, initial encounter for closed fracture: Secondary | ICD-10-CM

## 2020-05-28 ENCOUNTER — Encounter: Payer: Self-pay | Admitting: Family Medicine

## 2020-05-31 NOTE — Progress Notes (Signed)
CT scan does not show fracture.  It could be that the MRI is more sensitive.  I think it is likely that the MRI is showing bone irritation and center fracture on the CT scan which is usually more accurate fracture shows no fracture.

## 2020-06-01 ENCOUNTER — Other Ambulatory Visit: Payer: Self-pay

## 2020-06-01 ENCOUNTER — Ambulatory Visit (INDEPENDENT_AMBULATORY_CARE_PROVIDER_SITE_OTHER): Payer: 59 | Admitting: Family Medicine

## 2020-06-01 ENCOUNTER — Encounter: Payer: Self-pay | Admitting: Family Medicine

## 2020-06-01 VITALS — BP 142/90 | HR 60 | Ht 65.0 in | Wt 221.0 lb

## 2020-06-01 DIAGNOSIS — M25551 Pain in right hip: Secondary | ICD-10-CM | POA: Diagnosis not present

## 2020-06-01 DIAGNOSIS — S73191A Other sprain of right hip, initial encounter: Secondary | ICD-10-CM | POA: Diagnosis not present

## 2020-06-01 NOTE — Progress Notes (Signed)
Lori [name], am serving as a Education administrator for Dr. [name].  Lori [name] is a 54 y.o. female who presents to [place] at [facility] today for f/u of R hip/thigh pain and f/u of her pelvis CT.  She was last seen by Dr. [name] on 04/22/20 for review of her R hip MRI.  She reports worsening pain w/ stairs and w/ a FABER position.  Since her last visit, pt reports she is feeling worse. States that from the bottom of her right knee to her R hip hurts like a "toothache" and her groin is constantly hurting.  Diagnostic testing: CT pelvis- 05/27/20; R hip MRI- 04/19/20; R hip XR- 03/04/20   Pertinent review of systems: No fevers or chills  Relevant historical information: Hypertension   Exam:  BP (!) 142/90 (BP Location: Left Arm, Patient Position: Sitting, Cuff Size: Normal)   Pulse 60   Ht 5\' 5"  (1.651 m)   Wt 221 lb (100.2 kg)   LMP 09/27/2016 (Approximate)   SpO2 93%   BMI 36.78 kg/m  General: Well Developed, well nourished, and in no acute distress.   MSK: Right hip normal-appearing decreased range of motion pain with internal rotation and flexion and abduction. Antalgic gait requiring the use of a walker to ambulate    Lab and Radiology Results  EXAM: CT OF THE PELVIS WITHOUT CONTRAST  TECHNIQUE: Multidetector CT imaging of the pelvis was performed according to the standard protocol. Multiplanar CT image reconstructions were also generated.  COMPARISON:  Plain film of the pelvis 02/27/2018.  FINDINGS: Bones/Joint/Cartilage  There is no acute bony or joint abnormality. Mild degenerative change is seen about the symphysis pubis and sacroiliac joints. There is loss of disc space height and vacuum disc phenomenon at L5-S1. No worrisome bony lesion is identified. Hip joint spaces are preserved. No avascular necrosis of the femoral heads. No joint effusion.  Ligaments  Suboptimally assessed by CT.  Muscles and Tendons  Intact and  normal in appearance.  Soft tissues  No acute or focal abnormality. Small fat containing umbilical hernia is seen. The patient is status post appendectomy.  IMPRESSION: Negative for fracture.  No evidence of trauma.  Mild bilateral SI joint and symphysis pubis degenerative change. Degenerative disc disease L5-S1 also noted.  Small fat containing umbilical hernia.   Electronically Signed   By: Inge Rise M.D.   On: 05/27/2020 15:39   EXAM: MRI ARTHROGRAM OF THE RIGHT HIP  TECHNIQUE: Multiplanar, multisequence MR imaging was performed following the administration of intra-articular contrast.  CONTRAST:  See injection documentation  COMPARISON:  X-ray 03/04/2020  FINDINGS: Bones: No acute right hip fracture. No dislocation. No femoral head AVN. There is nondisplaced fracture involving the parasymphyseal aspect of the right pubic bone with mild associated bone marrow edema (series 7, images 27-30). Possible subtle nondisplaced fracture at the right pubic root (series 7, image 25).  Articular cartilage and labrum  Articular cartilage: Mild chondral thinning and surface irregularly superiorly without a focal chondral defect.  Labrum: Superior labral tear including a partial-thickness tear of the anterosuperior labrum (series 11, images 13-16). Posterosuperior labrum also appears to be degenerated/torn.  Joint or bursal effusion  Joint effusion:  Joint is well distended with injected contrast.  Bursae: No abnormal bursal fluid collection.  Muscles and tendons  Muscles and tendons: The gluteal, hamstring, iliopsoas, rectus femoris, and adductor tendons appear intact without tear or significant tendinosis. Preserved muscle bulk and signal intensity.  Other findings  Miscellaneous:   None.  IMPRESSION: 1. Nondisplaced fracture involving the parasymphyseal aspect of the right pubic bone with mild associated bone marrow edema.  Probable subtle nondisplaced fracture at the right pubic root. Both fractures appear subacute based on the minimal associated marrow edema. 2. Superior labral tear including a partial-thickness tear of the anterosuperior labrum.   Electronically Signed   By: Davina Poke D.O.   On: 04/20/2020 15:17 I, [name], personally (independently) visualized and performed the interpretation of the images attached in this note.    Assessment and Plan: 54 y.o. female with right hip pain multifactorial.  Hip MRI arthrogram in May showed what looked like insufficiency fractures of the pubic symphysis and acetabular root.  However follow-up CT scan did not support fractures looking more like possible healed stress reactions.  Regardless she still having considerable pain in her hip.  MRI arthrogram also did show a labrum tear which I think is the main source of her pain.  At this point she has failed conservative management and I think is time to consider surgery.  Based on her age she is probably would benefit more from a total hip replacement then hip scope.  Will refer to local orthopedic surgery for surgical consultation..  She has she is having great difficulty at work even working from home due to significant pain especially with seating position.  This should improve following surgery however plan for out of work until able to be evaluated orthopedic surgery.  Work note written today.    PDMP not reviewed this encounter. Orders Placed This Encounter  Procedures  . Ambulatory referral to Orthopedic Surgery    Referral Priority:   Routine    Referral Type:   Surgical    Referral Reason:   Specialty Services Required    Requested Specialty:   Orthopedic Surgery    Number of Visits Requested:   1   No orders of the defined types were placed in this encounter.    Discussed warning signs or symptoms. Please see discharge instructions. Patient expresses understanding.   The above  documentation has been reviewed and is accurate and complete [name], M.D.

## 2020-06-01 NOTE — Patient Instructions (Addendum)
Thank you for coming in today. Plan for referral to orthopedic surgery to discuss surgery for your hip.  This will likely be a total hip replacement.  Recheck with me as needed Let me know if you are not doing well.    Total Hip Replacement  Total hip replacement is a surgery to remove damaged bone in your hip joint and replace it with an artificial (prosthetic) hip joint. The hip is a ball-and-socket type of joint. It has two main parts. The ball part of the joint (femoral head) is the top of the thighbone (femur). The socket part of the joint is a large, hollow area on the outer side of your pelvis (acetabulum) where the femur and pelvis meet. During total hip replacement, one or both parts of the hip joint are replaced, depending on the type of joint damage that you have. The purpose of this surgery is to reduce pain and improve your hip function. Tell a health care provider about:  Any allergies you have.  All medicines you are taking, including vitamins, herbs, eye drops, creams, and over-the-counter medicines.  Any problems you or family members have had with anesthetic medicines.  Any blood disorders you have.  Any surgeries you have had.  Any medical conditions you have.  Whether you are pregnant or may be pregnant. What are the risks? Generally, this is a safe procedure. However, problems may occur, including:  Infection.  Bleeding.  Allergic reactions to medicines.  Damage to nerves or other structures.  Dislocation of the prosthetic joint (prosthesis).  Loosening of the prosthesis.  Fracture of the bone.  A blood clot, which can break loose and travel to your lungs (pulmonary embolus).  Compartment syndrome.  Deep vein thrombosis. What happens before the procedure? Staying hydrated Follow instructions from your health care provider about hydration, which may include:  Up to 2 hours before the procedure - you may continue to drink clear liquids, such as  water, clear fruit juice, black coffee, and plain tea. Eating and drinking restrictions  Follow instructions from your health care provider about eating and drinking, which may include: ? 8 hours before the procedure - stop eating heavy meals or foods such as meat, fried foods, or fatty foods. ? 6 hours before the procedure - stop eating light meals or foods, such as toast or cereal. ? 6 hours before the procedure - stop drinking milk or drinks that contain milk. ? 2 hours before the procedure - stop drinking clear liquids. Medicines  Ask your health care provider about: ? Changing or stopping your regular medicines. This is especially important if you are taking diabetes medicines or blood thinners. ? Taking medicines such as aspirin and ibuprofen. These medicines can thin your blood. Do not take these medicines unless your health care provider tells you to take them. ? Taking over-the-counter medicines, vitamins, herbs, or supplements. General instructions  You may have a physical exam.  You may have testing, such as: ? X-rays or an MRI. ? Blood or urine tests.  Plan to have someone take you home from the hospital or clinic.  Plan to have a responsible adult care for you for at least 24 hours after you leave the hospital or clinic. This is important.  Prepare your home so you can be safe and have easy access to what you need.  Keep your body and teeth clean. Germs from anywhere in your body can travel to your new joint and infect it. Tell your health  care provider if you: ? Plan to have dental care and routine cleanings. ? Develop any skin infections.  Avoid shaving your legs just before surgery. If any shaving is needed, it will be done in the hospital.  Ask your health care provider how your surgical site will be marked or identified. What happens during the procedure?  To lower your risk of infection: ? Your health care team will wash or sanitize their hands. ? Hair may be  removed from the surgical area. ? Your skin will be washed with soap.  An IV will be inserted into one of your veins.  You will be given one or more of the following: ? A medicine to help you relax (sedative). ? A medicine to make you fall asleep (general anesthetic). ? A medicine that is injected into your spine to numb the area below and slightly above the injection site (spinal anesthetic).  An incision will be made so the surgeon can see the bones and tissue in your hip. The location of the incision will depend on the approach used by the surgeon: ? Posterior approach. The incision will be at the back of the hip. ? Anterior approach. The incision will be at the front of the hip.  Then, your surgeon will: ? Use his or her hands to move your hip out of position (dislocate it). ? Cut and remove damaged pieces of bone and cartilage. ? Insert a prosthetic ball and socket into the hip joint. ? Secure the ball and socket in the hip joint. ? Do an X-ray of the hip joint to confirm proper placement. ? Place a drain to remove excess fluid, if needed. ? Close the incision and apply a bandage (dressing) over the surgical site. The procedure may vary among health care providers and hospitals. What happens after the procedure?  Your blood pressure, heart rate, breathing rate, and blood oxygen level will be monitored until the medicines you were given have worn off.  Your neurovascular status will be monitored.  You will be given pain medicine.  You may have to wear compression stockings. These help to prevent blood clots and reduce swelling in your legs. You may also be given a blood-thinning (anticoagulant) medicine.  You will receive physical therapy until your health care provider feels it is safe for you to go home. You may need to use a walker or crutches.  If you had the posterior approach, you may have to use a wedge (hip abduction) pillow when you are in bed. ? This pillow will  protect your hip from dislocation by keeping it straight and will prevent your legs from turning inward or away from your body. Summary  Total hip replacement is a surgery to remove damaged bone in your hip joint and replace it with an artificial (prosthetic) hip joint.  Before the procedure, follow instructions from your health care provider about eating and drinking.  Plan to have someone take you home from the hospital or clinic.  After the surgery, you may have to wear compression stockings. These help to prevent blood clots and reduce swelling in your legs. This information is not intended to replace advice given to you by your health care provider. Make sure you discuss any questions you have with your health care provider. Document Revised: 04/07/2019 Document Reviewed: 08/22/2017 Elsevier Patient Education  2020 [employer].

## 2020-06-09 ENCOUNTER — Ambulatory Visit: Payer: Self-pay

## 2020-06-09 ENCOUNTER — Encounter: Payer: Self-pay | Admitting: Orthopaedic Surgery

## 2020-06-09 ENCOUNTER — Ambulatory Visit: Payer: 59 | Admitting: Orthopaedic Surgery

## 2020-06-09 DIAGNOSIS — M1611 Unilateral primary osteoarthritis, right hip: Secondary | ICD-10-CM | POA: Diagnosis not present

## 2020-06-09 DIAGNOSIS — S73191A Other sprain of right hip, initial encounter: Secondary | ICD-10-CM

## 2020-06-09 DIAGNOSIS — R634 Abnormal weight loss: Secondary | ICD-10-CM | POA: Diagnosis not present

## 2020-06-09 NOTE — Progress Notes (Signed)
Subjective: Patient is here for ultrasound-guided intra-articular right hip injection.   She has mild DJD and a superior labral tear.  Pain in the posterior and anterior hip.  Objective: She is tender posteriorly near the iliac crest.  She has pain with passive internal rotation of her hip.  Procedure: Ultrasound-guided right hip injection: After sterile prep with Betadine, injected 8 cc 1% lidocaine without epinephrine and 40 mg methylprednisolone using a 22-gauge spinal needle, passing the needle through the iliofemoral ligament into the femoral head/neck junction.  Injectate was seen filling the joint capsule.  She had mild improvement during the immediate anesthetic phase.  She will follow-up as directed.
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
## 2020-06-11 ENCOUNTER — Telehealth: Payer: Self-pay

## 2020-06-11 DIAGNOSIS — S73191A Other sprain of right hip, initial encounter: Secondary | ICD-10-CM

## 2020-06-11 DIAGNOSIS — M25551 Pain in right hip: Secondary | ICD-10-CM

## 2020-06-11 NOTE — Telephone Encounter (Signed)
Patient would like a call back to discuss her visit with Dr. Erlinda Hong

## 2020-06-11 NOTE — Telephone Encounter (Signed)
I called Debbie back. She wants a second oppinon with Dr Mayer Camel at [facility] and has it scheduled for 7/20. I think this is a good idea and is reasonable.  She is a bit of an edge case and Dr.Xu trying conservative management is reasonable.  I think it is also a good idea to have a second opinion as well.  Referral ordered.

## 2020-06-11 NOTE — Addendum Note (Signed)
Addended by: Gregor Hams on: 06/11/2020 12:20 PM   Modules accepted: Orders

## 2020-06-15 ENCOUNTER — Other Ambulatory Visit: Payer: Self-pay | Admitting: Family Medicine

## 2020-06-28 ENCOUNTER — Ambulatory Visit (INDEPENDENT_AMBULATORY_CARE_PROVIDER_SITE_OTHER): Payer: 59 | Admitting: Bariatrics

## 2020-06-28 ENCOUNTER — Other Ambulatory Visit: Payer: Self-pay

## 2020-06-28 ENCOUNTER — Encounter (INDEPENDENT_AMBULATORY_CARE_PROVIDER_SITE_OTHER): Payer: Self-pay | Admitting: Bariatrics

## 2020-06-28 VITALS — BP 138/88 | HR 90 | Temp 98.4°F | Ht 64.0 in | Wt 216.0 lb

## 2020-06-28 DIAGNOSIS — G4733 Obstructive sleep apnea (adult) (pediatric): Secondary | ICD-10-CM

## 2020-06-28 DIAGNOSIS — Z1331 Encounter for screening for depression: Secondary | ICD-10-CM

## 2020-06-28 DIAGNOSIS — E1159 Type 2 diabetes mellitus with other circulatory complications: Secondary | ICD-10-CM

## 2020-06-28 DIAGNOSIS — E785 Hyperlipidemia, unspecified: Secondary | ICD-10-CM

## 2020-06-28 DIAGNOSIS — R5383 Other fatigue: Secondary | ICD-10-CM

## 2020-06-28 DIAGNOSIS — E669 Obesity, unspecified: Secondary | ICD-10-CM

## 2020-06-28 DIAGNOSIS — R0602 Shortness of breath: Secondary | ICD-10-CM | POA: Diagnosis not present

## 2020-06-28 DIAGNOSIS — Z0289 Encounter for other administrative examinations: Secondary | ICD-10-CM

## 2020-06-28 DIAGNOSIS — Z9189 Other specified personal risk factors, not elsewhere classified: Secondary | ICD-10-CM

## 2020-06-28 DIAGNOSIS — I10 Essential (primary) hypertension: Secondary | ICD-10-CM

## 2020-06-28 DIAGNOSIS — R748 Abnormal levels of other serum enzymes: Secondary | ICD-10-CM

## 2020-06-28 DIAGNOSIS — Z6837 Body mass index (BMI) 37.0-37.9, adult: Secondary | ICD-10-CM

## 2020-06-28 DIAGNOSIS — K219 Gastro-esophageal reflux disease without esophagitis: Secondary | ICD-10-CM

## 2020-06-28 DIAGNOSIS — E1169 Type 2 diabetes mellitus with other specified complication: Secondary | ICD-10-CM

## 2020-06-28 DIAGNOSIS — I152 Hypertension secondary to endocrine disorders: Secondary | ICD-10-CM

## 2020-06-28 NOTE — Progress Notes (Signed)
Dear Dr. Frankey Shown,   Thank you for referring Carleene Overlie to our clinic. The following note includes my evaluation and treatment recommendations.  Chief Complaint:   Lori [name] (MR# [id number]) is a 54 y.o. female who presents for evaluation and treatment of obesity and related comorbidities. Current BMI is Body mass index is 37.08 kg/m.Marland Kitchen Jayde has been struggling with her weight for many years and has been unsuccessful in either losing weight, maintaining weight loss, or reaching her healthy weight goal.  Marthella is currently in the action stage of change and ready to dedicate time achieving and maintaining a healthier weight. Kennya is interested in becoming our patient and working on intensive lifestyle modifications including (but not limited to) diet and exercise for weight loss.  Phelicia notes time and energy as obstacles to cooking. She mainly craves carbohydrates. She skips breakfast mostly and occasionally lunch.  Kaiyana's habits were reviewed today and are as follows: Her family eats meals together, she thinks her family will eat healthier with her, her desired weight loss is 56 lbs, she has been heavy most of her life, she started gaining weight around the age of 14, her heaviest weight ever was 230 pounds, she snacks sometimes in the evenings, she skips breakfast mostly and lunch half the time, she is frequently drinking liquids with calories, she frequently makes poor food choices, she has problems with excessive hunger on occasion, she has binge eating behaviors and she struggles with emotional eating.  Depression Screen [place]'s Food and Mood (modified PHQ-9) score was 7.  Depression screen PHQ 2/9 06/28/2020  Decreased Interest 1  Down, Depressed, Hopeless 1  PHQ - 2 Score 2  Altered sleeping 1  Tired, decreased energy 1  Change in appetite 2  Feeling bad or failure about yourself  0  Trouble concentrating 1  Moving slowly or fidgety/restless 0  Suicidal  thoughts 0  PHQ-9 Score 7  Difficult doing work/chores Somewhat difficult   Subjective:   Other fatigue. Tyrihanna admits to daytime somnolence and admits to waking up still tired. Demetric generally gets 3-5 hours of sleep per night, and states that she does not sleep well most nights. Snoring is present. Apneic episodes are not present. Epworth Sleepiness Score is 9.  SOB (shortness of breath) on exertion. Evin notes increasing shortness of breath with certain activities and seems to be worsening over time with weight gain. She notes getting out of breath sooner with activity than she used to. This has gotten worse recently. Faryn denies shortness of breath at rest or orthopnea.  Type 2 diabetes mellitus with obesity ([place]).  Dovey is taking metformin.  Lab Results  Component Value Date   HGBA1C 7.0 (H) 02/13/2020   HGBA1C 6.6 (H) 10/27/2019   Lab Results  Component Value Date   MICROALBUR 10.1 (H) 02/13/2020   LDLCALC 139 (H) 10/06/2016   CREATININE 0.71 02/13/2020   No results found for: INSULIN  Hyperlipidemia associated with type 2 diabetes mellitus ([place]). Obelia is taking pravastatin. Last LDL 169.0 on 02/13/2020 with a total cholesterol of 254.    Lab Results  Component Value Date   CHOL 254 (H) 02/13/2020   HDL 35.00 (L) 02/13/2020   LDLCALC 139 (H) 10/06/2016   LDLDIRECT 169.0 02/13/2020   TRIG 278.0 (H) 02/13/2020   CHOLHDL 7 02/13/2020   Lab Results  Component Value Date   ALT 75 (H) 02/13/2020   AST 67 (H) 02/13/2020   ALKPHOS 105 02/13/2020  BILITOT 0.6 02/13/2020   The 10-year ASCVD risk score Mikey Bussing DC Jr., et al., 2013) is: 25.9%   Values used to calculate the score:     Age: 32 years     Sex: Female     Is Non-Hispanic African American: No     Diabetic: Yes     Tobacco smoker: Yes     Systolic Blood Pressure: 425 mmHg     Is BP treated: Yes     HDL Cholesterol: 35 mg/dL     Total Cholesterol: 254 mg/dL  OSA (obstructive sleep apnea). Zeniah takes  melatonin. She wears CPAP.  Gastroesophageal reflux disease without esophagitis. Laurian had a Nissen fundoplication and reports occasional reflux. She is on no medication.  Elevated liver enzymes. Kenadi has a new dx of elevated ALT. She denies abdominal pain or jaundice and has never been told of any liver problems in the past. She denies excessive alcohol intake. Kinisha has been referred to GI for colonoscopy and endoscopy.   Lab Results  Component Value Date   ALT 75 (H) 02/13/2020   AST 67 (H) 02/13/2020   ALKPHOS 105 02/13/2020   BILITOT 0.6 02/13/2020   Hypertension associated with diabetes ([place]). Izora is taking HCTZ and losartan.  BP Readings from Last 3 Encounters:  06/28/20 138/88  06/01/20 (!) 142/90  04/22/20 112/76   Lab Results  Component Value Date   CREATININE 0.71 02/13/2020   CREATININE 0.71 02/02/2020   CREATININE 0.82 10/27/2019   Depression screening. Kikue had a mildly positive depression screen with a PHQ-9 score of 7.  At risk for heart disease. Makiyla is at a higher than average risk for cardiovascular disease due to hypertension, diabetes mellitus type II, and obesity.   Assessment/Plan:   Other fatigue. Rudine does feel that her weight is causing her energy to be lower than it should be. Fatigue may be related to obesity, depression or many other causes. Labs will be ordered, and in the meanwhile, Kimberlynn will focus on self care including making healthy food choices, increasing physical activity and focusing on stress reduction. EKG 12-Lead, VITAMIN D 25 Hydroxy (Vit-D Deficiency, Fractures), T3, T4, free, TSH testing ordered today.  SOB (shortness of breath) on exertion. Amalya does feel that she gets out of breath more easily that she used to when she exercises. Malachi's shortness of breath appears to be obesity related and exercise induced. She has agreed to work on weight loss and gradually increase exercise to treat her exercise induced shortness of breath.  Will continue to monitor closely. VITAMIN D 25 Hydroxy (Vit-D Deficiency, Fractures), T3, T4, free, TSH ordered today.  Type 2 diabetes mellitus with obesity ([place]). Good blood sugar control is important to decrease the likelihood of diabetic complications such as nephropathy, neuropathy, limb loss, blindness, coronary artery disease, and death. Intensive lifestyle modification including diet, exercise and weight loss are the first line of treatment for diabetes. Isela will continue her medication as directed. Comprehensive metabolic panel, Hemoglobin A1c, Insulin, random, Lipid Panel With LDL/HDL Ratio labs ordered today.  Hyperlipidemia associated with type 2 diabetes mellitus ([place]). Cardiovascular risk and specific lipid/LDL goals reviewed.  We discussed several lifestyle modifications today and Rayleigh will continue to work on diet, exercise and weight loss efforts. Orders and follow up as documented in patient record. She will continue her medication as directed. Lipids will be checked today.  Counseling Intensive lifestyle modifications are the first line treatment for this issue. . Dietary changes: Increase soluble fiber. Decrease  simple carbohydrates. . Exercise changes: Moderate to vigorous-intensity aerobic activity 150 minutes per week if tolerated.  . Lipid-lowering medications: see documented in medical record.  OSA (obstructive sleep apnea). Intensive lifestyle modifications are the first line treatment for this issue. We discussed several lifestyle modifications today and she will continue to work on diet, exercise and weight loss efforts. We will continue to monitor. Orders and follow up as documented in patient record. Heba will continue to wear CPAP nightly.  Counseling  Sleep apnea is a condition in which breathing pauses or becomes shallow during sleep. This happens over and over during the night. This disrupts your sleep and keeps your body from getting the rest that it needs,  which can cause tiredness and lack of energy (fatigue) during the day.  Sleep apnea treatment: If you were given a device to open your airway while you sleep, USE IT!  Sleep hygiene:   Limit or avoid alcohol, caffeinated beverages, and cigarettes, especially close to bedtime.   Do not eat a large meal or eat spicy foods right before bedtime. This can lead to digestive discomfort that can make it hard for you to sleep.  Keep a sleep diary to help you and your health care provider figure out what could be causing your insomnia.  . Make your bedroom a dark, comfortable place where it is easy to fall asleep. ? Put up shades or blackout curtains to block light from outside. ? Use a white noise machine to block noise. ? Keep the temperature cool. . Limit screen use before bedtime. This includes: ? Watching TV. ? Using your smartphone, tablet, or computer. . Stick to a routine that includes going to bed and waking up at the same times every day and night. This can help you fall asleep faster. Consider making a quiet activity, such as reading, part of your nighttime routine. . Try to avoid taking naps during the day so that you sleep better at night. . Get out of bed if you are still awake after 15 minutes of trying to sleep. Keep the lights down, but try reading or doing a quiet activity. When you feel sleepy, go back to bed.  Gastroesophageal reflux disease without esophagitis. Intensive lifestyle modifications are the first line treatment for this issue. We discussed several lifestyle modifications today and she will continue to work on diet, exercise and weight loss efforts. Orders and follow up as documented in patient record. Cynda will watch triggers and will use medication as needed.  Counseling . If a person has gastroesophageal reflux disease (GERD), food and stomach acid move back up into the esophagus and cause symptoms or problems such as damage to the esophagus. . Anti-reflux measures  include: raising the head of the bed, avoiding tight clothing or belts, avoiding eating late at night, not lying down shortly after mealtime, and achieving weight loss. . Avoid ASA, NSAID's, caffeine, alcohol, and tobacco.  . OTC Pepcid and/or Tums are often very helpful for as needed use.  Marland Kitchen However, for persisting chronic or daily symptoms, stronger medications like Omeprazole may be needed. . You may need to avoid foods and drinks such as: ? Coffee and tea (with or without caffeine). ? Drinks that contain alcohol. ? Energy drinks and sports drinks. ? Bubbly (carbonated) drinks or sodas. ? Chocolate and cocoa. ? Peppermint and mint flavorings. ? Garlic and onions. ? Horseradish. ? Spicy and acidic foods. These include peppers, chili powder, curry powder, vinegar, hot sauces, and BBQ sauce. ?  Citrus fruit juices and citrus fruits, such as oranges, lemons, and limes. ? Tomato-based foods. These include red sauce, chili, salsa, and pizza with red sauce. ? Fried and fatty foods. These include donuts, french fries, potato chips, and high-fat dressings. ? High-fat meats. These include hot dogs, rib eye steak, sausage, ham, and bacon.  Elevated liver enzymes. We discussed the likely diagnosis of non-alcoholic fatty liver disease today and how this condition is obesity related. Tanja was educated the importance of weight loss. Serria agreed to continue with her weight loss efforts with healthier diet and exercise as an essential part of her treatment plan. Liver enzymes will be checked today.  Hypertension associated with diabetes ([place]). Aujanae is working on healthy weight loss and exercise to improve blood pressure control. We will watch for signs of hypotension as she continues her lifestyle modifications. She will continue her medications as directed.  Depression screening. Anyiah had a positive depression screening. Depression is commonly associated with obesity and often results in emotional  eating behaviors. We will monitor this closely and work on CBT to help improve the non-hunger eating patterns. Referral to Psychology may be required if no improvement is seen as she continues in our clinic.  At risk for heart disease. Lajada was given approximately 15 minutes of coronary artery disease prevention counseling today. She is 54 y.o. female and has risk factors for heart disease including obesity. We discussed intensive lifestyle modifications today with an emphasis on specific weight loss instructions and strategies.   Repetitive spaced learning was employed today to elicit superior memory formation and behavioral change.  Class 2 severe obesity with serious comorbidity and body mass index (BMI) of 37.0 to 37.9 in adult, unspecified obesity type ([place]).  Avyana is currently in the action stage of change and her goal is to continue with weight loss efforts. I recommend Deauna begin the structured treatment plan as follows:  She has agreed to the Category 3 Plan (no eggs).  She will work on meal planning, intentional eating, and will stop all sugary beverages.  We reviewed with the patient labs from 02/13/2020 including CMP, lipids, microalbumin, CBC, and A1c.  Exercise goals: All adults should avoid inactivity. Some physical activity is better than none, and adults who participate in any amount of physical activity gain some health benefits.   Behavioral modification strategies: increasing lean protein intake, decreasing simple carbohydrates, increasing vegetables, increasing water intake, decreasing eating out, no skipping meals, meal planning and cooking strategies, keeping healthy foods in the home and planning for success.  She was informed of the importance of frequent follow-up visits to maximize her success with intensive lifestyle modifications for her multiple health conditions. She was informed we would discuss her lab results at her next visit unless there is a critical issue  that needs to be addressed sooner. Florentine agreed to keep her next visit at the agreed upon time to discuss these results.  Objective:   Blood pressure 138/88, pulse 90, temperature 98.4 F (36.9 C), height 5\' 4"  (1.626 m), weight 216 lb (98 kg), last menstrual period 09/27/2016, SpO2 98 %. Body mass index is 37.08 kg/m.  EKG: Sinus  Rhythm with a rate of 92 BPM. Nonspecific T-abnormality. Otherwise normal.   Indirect Calorimeter completed today shows a VO2 of 310 and a REE of 2154.  Her calculated basal metabolic rate is 8416 thus her basal metabolic rate is better than expected.  General: Cooperative, alert, well developed, in no acute distress. HEENT: Conjunctivae and  lids unremarkable. Cardiovascular: Regular rhythm.  Lungs: Normal work of breathing. Neurologic: No focal deficits.   Lab Results  Component Value Date   CREATININE 0.71 02/13/2020   BUN 12 02/13/2020   NA 139 02/13/2020   K 4.5 02/13/2020   CL 100 02/13/2020   CO2 30 02/13/2020   Lab Results  Component Value Date   ALT 75 (H) 02/13/2020   AST 67 (H) 02/13/2020   ALKPHOS 105 02/13/2020   BILITOT 0.6 02/13/2020   Lab Results  Component Value Date   HGBA1C 7.0 (H) 02/13/2020   HGBA1C 6.6 (H) 10/27/2019   No results found for: INSULIN Lab Results  Component Value Date   TSH 2.302 10/27/2019   Lab Results  Component Value Date   CHOL 254 (H) 02/13/2020   HDL 35.00 (L) 02/13/2020   LDLCALC 139 (H) 10/06/2016   LDLDIRECT 169.0 02/13/2020   TRIG 278.0 (H) 02/13/2020   CHOLHDL 7 02/13/2020   Lab Results  Component Value Date   WBC 11.0 (H) 02/13/2020   HGB 14.2 02/13/2020   HCT 42.2 02/13/2020   MCV 96.0 02/13/2020   PLT 210.0 02/13/2020   Lab Results  Component Value Date   IRON 200 (H) 12/11/2019   TIBC 296 10/27/2019   FERRITIN 216.0 12/11/2019   Attestation Statements:   Reviewed by clinician on day of visit: allergies, medications, problem list, medical history, surgical history, family  history, social history, and previous encounter notes.  Migdalia Dk, am acting as Location manager for [employer], DO   I have reviewed the above documentation for accuracy and completeness, and I agree with the above. Jearld Lesch, DO

## 2020-06-29 ENCOUNTER — Encounter (INDEPENDENT_AMBULATORY_CARE_PROVIDER_SITE_OTHER): Payer: Self-pay | Admitting: Bariatrics

## 2020-06-29 LAB — COMPREHENSIVE METABOLIC PANEL
ALT: 41 IU/L — ABNORMAL HIGH (ref 0–32)
AST: 30 IU/L (ref 0–40)
Albumin/Globulin Ratio: 2.1 (ref 1.2–2.2)
Albumin: 4.7 g/dL (ref 3.8–4.9)
Alkaline Phosphatase: 89 IU/L (ref 48–121)
BUN/Creatinine Ratio: 18 (ref 9–23)
BUN: 12 mg/dL (ref 6–24)
Bilirubin Total: 0.3 mg/dL (ref 0.0–1.2)
CO2: 27 mmol/L (ref 20–29)
Calcium: 9.6 mg/dL (ref 8.7–10.2)
Chloride: 102 mmol/L (ref 96–106)
Creatinine, Ser: 0.68 mg/dL (ref 0.57–1.00)
GFR calc Af Amer: 114 mL/min/{1.73_m2} (ref >59)
GFR calc non Af Amer: 99 mL/min/{1.73_m2} (ref >59)
Globulin, Total: 2.2 g/dL (ref 1.5–4.5)
Glucose: 109 mg/dL — ABNORMAL HIGH (ref 65–99)
Potassium: 4.7 mmol/L (ref 3.5–5.2)
Sodium: 142 mmol/L (ref 134–144)
Total Protein: 6.9 g/dL (ref 6.0–8.5)

## 2020-06-29 LAB — T3: T3, Total: 127 ng/dL (ref 71–180)

## 2020-06-29 LAB — HEMOGLOBIN A1C
Est. average glucose Bld gHb Est-mCnc: 128 mg/dL
Hgb A1c MFr Bld: 6.1 % — ABNORMAL HIGH (ref 4.8–5.6)

## 2020-06-29 LAB — LIPID PANEL WITH LDL/HDL RATIO
Cholesterol, Total: 196 mg/dL (ref 100–199)
HDL: 42 mg/dL (ref >39)
LDL Chol Calc (NIH): 125 mg/dL — ABNORMAL HIGH (ref 0–99)
LDL/HDL Ratio: 3 ratio (ref 0.0–3.2)
Triglycerides: 164 mg/dL — ABNORMAL HIGH (ref 0–149)
VLDL Cholesterol Cal: 29 mg/dL (ref 5–40)

## 2020-06-29 LAB — INSULIN, RANDOM: INSULIN: 30.7 u[IU]/mL — ABNORMAL HIGH (ref 2.6–24.9)

## 2020-06-29 LAB — TSH: TSH: 1.3 u[IU]/mL (ref 0.450–4.500)

## 2020-06-29 LAB — VITAMIN D 25 HYDROXY (VIT D DEFICIENCY, FRACTURES): Vit D, 25-Hydroxy: 32.7 ng/mL (ref 30.0–100.0)

## 2020-06-29 LAB — T4, FREE: Free T4: 1.26 ng/dL (ref 0.82–1.77)

## 2020-06-30 ENCOUNTER — Encounter: Payer: Self-pay | Admitting: Family Medicine

## 2020-06-30 ENCOUNTER — Other Ambulatory Visit: Payer: Self-pay

## 2020-06-30 ENCOUNTER — Ambulatory Visit: Payer: 59 | Admitting: Family Medicine

## 2020-06-30 VITALS — BP 118/72 | HR 85 | Temp 98.4°F | Ht 64.0 in | Wt 217.1 lb

## 2020-06-30 DIAGNOSIS — M7061 Trochanteric bursitis, right hip: Secondary | ICD-10-CM | POA: Diagnosis not present

## 2020-06-30 DIAGNOSIS — K760 Fatty (change of) liver, not elsewhere classified: Secondary | ICD-10-CM

## 2020-06-30 DIAGNOSIS — G4733 Obstructive sleep apnea (adult) (pediatric): Secondary | ICD-10-CM | POA: Diagnosis not present

## 2020-06-30 DIAGNOSIS — I10 Essential (primary) hypertension: Secondary | ICD-10-CM | POA: Diagnosis not present

## 2020-06-30 DIAGNOSIS — M542 Cervicalgia: Secondary | ICD-10-CM

## 2020-06-30 DIAGNOSIS — E785 Hyperlipidemia, unspecified: Secondary | ICD-10-CM

## 2020-06-30 DIAGNOSIS — G8929 Other chronic pain: Secondary | ICD-10-CM

## 2020-06-30 DIAGNOSIS — M5441 Lumbago with sciatica, right side: Secondary | ICD-10-CM

## 2020-06-30 DIAGNOSIS — M5442 Lumbago with sciatica, left side: Secondary | ICD-10-CM

## 2020-06-30 DIAGNOSIS — E1169 Type 2 diabetes mellitus with other specified complication: Secondary | ICD-10-CM

## 2020-06-30 DIAGNOSIS — F17200 Nicotine dependence, unspecified, uncomplicated: Secondary | ICD-10-CM

## 2020-06-30 MED ORDER — PRAVASTATIN SODIUM 40 MG PO TABS
40.0000 mg | ORAL_TABLET | Freq: Every day | ORAL | 3 refills | Status: DC
Start: 2020-06-30 — End: 2021-03-03

## 2020-06-30 MED ORDER — DIAZEPAM 5 MG PO TABS: ORAL_TABLET | ORAL | 0 refills | Status: DC

## 2020-06-30 NOTE — Progress Notes (Signed)
Lori [name] DOB: [birth year] Encounter date: 06/30/2020  This is a 54 y.o. female who presents with Chief Complaint  Patient presents with  . Diabetes    History of present illness:  DMII: working on healthier eating. Working on regular exercise.  Tobacco use: down to half pack.   Obesity: see below - really working on cutting back, healthy eating. Has been difficult with pain, accident, mood.   Vaginal atrophy:premarin was too expensive - tried A and D and colloidal silver cream which helped.   HTN: has been checking some at home. Feels like injections she has had have been helpful for keeping her moving around more. She was referred to Dr. Leafy Ro as well for med weight loss. Recommended premier shakes for breakfast and gave her ideas for tips.   Arthritis: hip injection 6/30. Got second opinion about need for replacement. She was told that they didn't think she had enough arthritis for this. Pain anterior thight and pain back of hip and up into butt. It was recommended for her to have repeat MRI worried that something was going on in back to make pain worse. Constant throbbing right thigh. Like bad toothache. No numbness, tingling in right foot. Down to knee, up to side of hip and feels underneath buttock. Has sciatica left side. Has had injections for this in the past which do usually help. Lower back always hurts her. Hurts across lumb back - back sort of locks with flexion and she has to use knees to get down and difficult to push self back up.    Allergies  Allergen Reactions  . Amoxicillin Nausea Only    Yeast Infection Has patient had a PCN reaction causing immediate rash, facial/tongue/throat swelling, SOB or lightheadedness with hypotension: no Has patient had a PCN reaction causing severe rash involving mucus membranes or skin necrosis: no Has patient had a PCN reaction that required hospitalization yes Has patient had a PCN reaction occurring within the last 10 years:  yes If all of the above answers are "NO", then may proceed with Cephalosporin use.  Yeast Infection Has patient had a PCN reaction causing immediate rash, facial/tongue/throat swelling, SOB or lightheadedness with hypotension: no Has patient had a PCN reaction causing severe rash involving mucus membranes or skin necrosis: no Has patient had a PCN reaction that required hospitalization yes Has patient had a PCN reaction occurring within the last 10 years: yes If all of the above answers are "NO", then may proceed with Cephalosporin use.  . Eggs Or Egg-Derived Products   . Morphine Other (See Comments)    REACTION: hyper REACTION: hyper  . Thiethylperazine Other (See Comments)  . Latex Rash   Current Meds  Medication Sig  . alendronate (FOSAMAX) 70 MG tablet Take 1 tablet (70 mg total) by mouth every 7 (seven) days.  . AMBULATORY NON FORMULARY MEDICATION Rolling walker.  Use daily.  Disp 1 S32.591A  . blood glucose meter kit and supplies KIT Dispense based on patient and insurance preference. Use up to four times daily as directed. (FOR ICD-9 250.00, 250.01).  Marland Kitchen buPROPion (WELLBUTRIN XL) 150 MG 24 hr tablet TAKE 2 TABLETS (300 MG TOTAL) BY MOUTH DAILY.  . cetirizine (ZYRTEC) 10 MG tablet Take 10 mg by mouth at bedtime.  . Cholecalciferol (VITAMIN D3 PO) Take 1,000 Units by mouth daily.  . diazepam (VALIUM) 5 MG tablet TAKE 1 TABLET BY MOUTH EVERY 12 HOURS AS NEEDED FOR ANXIETY OR MUSCLE SPASMS  . fluticasone (FLONASE) 50  MCG/ACT nasal spray Place 2 sprays into both nostrils daily.  . hydrochlorothiazide (HYDRODIURIL) 25 MG tablet TAKE 1 TABLET BY MOUTH EVERY DAY  . ibuprofen (ADVIL) 200 MG tablet Take 800 mg by mouth as needed.  Marland Kitchen losartan (COZAAR) 50 MG tablet Take 1 tablet (50 mg total) by mouth daily.  . metFORMIN (GLUCOPHAGE) 500 MG tablet Take 1 tablet (500 mg total) by mouth 2 (two) times daily with a meal. Start with one tablet daily and increase to twice daily as tolerated.  .  Multiple Vitamins-Minerals (EMERGEN-C IMMUNE PO) Take 1 tablet by mouth daily. With zinc  . ONETOUCH ULTRA test strip USE TO TEST BLOOD SUGAR UP TO 4 TIMES A DAY  . Oxycodone HCl 10 MG TABS Take 10 mg by mouth 5 (five) times daily as needed.  . venlafaxine XR (EFFEXOR XR) 37.5 MG 24 hr capsule Take 1 capsule (37.5 mg total) by mouth daily with breakfast.  . [DISCONTINUED] diazepam (VALIUM) 5 MG tablet TAKE 1 TABLET BY MOUTH EVERY 12 HOURS AS NEEDED FOR ANXIETY OR MUSCLE SPASMS  . [DISCONTINUED] pravastatin (PRAVACHOL) 20 MG tablet Take 1 tablet (20 mg total) by mouth at bedtime.   Current Facility-Administered Medications for the 06/30/20 encounter (Office Visit) with Caren Macadam, MD  Medication  . 0.9 %  sodium chloride infusion    Review of Systems  Constitutional: Negative for chills, fatigue and fever.  Respiratory: Negative for cough, chest tightness, shortness of breath and wheezing.   Cardiovascular: Negative for chest pain, palpitations and leg swelling.  Musculoskeletal: Positive for arthralgias and back pain.    Objective:  BP 118/72 (BP Location: Left Arm, Patient Position: Sitting, Cuff Size: Large)   Pulse 85   Temp 98.4 F (36.9 C) (Oral)   Ht 5' 4"  (1.626 m)   Wt 217 lb 1.6 oz (98.5 kg)   LMP 09/27/2016 (Approximate)   BMI 37.27 kg/m   Weight: 217 lb 1.6 oz (98.5 kg)   BP Readings from Last 3 Encounters:  06/30/20 118/72  06/28/20 138/88  06/01/20 (!) 142/90   Wt Readings from Last 3 Encounters:  06/30/20 217 lb 1.6 oz (98.5 kg)  06/28/20 216 lb (98 kg)  06/01/20 221 lb (100.2 kg)    Physical Exam Constitutional:      General: She is not in acute distress.    Appearance: She is well-developed.  HENT:     Head: Normocephalic and atraumatic.  Cardiovascular:     Rate and Rhythm: Normal rate and regular rhythm.     Heart sounds: Normal heart sounds. No murmur heard.   Pulmonary:     Effort: Pulmonary effort is normal.     Breath sounds: Normal  breath sounds.  Abdominal:     General: Bowel sounds are normal. There is no distension.     Palpations: Abdomen is soft.     Tenderness: There is no abdominal tenderness. There is no guarding.  Musculoskeletal:     Comments: Right trochanteric bursa tenderness, IT band tenderness on the right.  She has lumbar/sacroiliac bilateral tenderness with extension/twisting/tilting with significant pain with extension following full flexion.  Some discomfort in lower back with right straight leg raise.  Skin:    General: Skin is warm and dry.     Comments: Sensory exam of the foot is normal, tested with the monofilament. Good pulses, no lesions or ulcers, good peripheral pulses.  Psychiatric:        Judgment: Judgment normal.     Assessment/Plan  1. Chronic right-sided low back pain with bilateral sciatica Back pain worsening in spite of interventions.  Going to order MRI to help facilitate further evaluation/helpful intervention. - MR LUMBAR SPINE WO CONTRAST; Future  2. Trochanteric bursitis of right hip We discussed stretches for bursitis as well as regular icing.  If not having improvement with this we can consider trochanteric bursa injection.  3. Essential hypertension Blood pressure well controlled.  Continue current medications.  4. OSA (obstructive sleep apnea) She is using machine for sleep.  She is still adjusting to this this is limited by hip and back pain.  5. Hepatic steatosis She is actively working on healthy eating, more exercise, and weight loss.  Working with weight loss specialist.  6. Hyperlipidemia associated with type 2 diabetes mellitus ([place]) Pravastatin recently increased to 40 mg for better lipid control.  7. TOBACCO USE She is working on decreasing, but still smoking about half a pack daily.  We will continue to reassess.  She is not really ready to quit at this point.  8. Neck pain - diazepam (VALIUM) 5 MG tablet; TAKE 1 TABLET BY MOUTH EVERY 12 HOURS AS  NEEDED FOR ANXIETY OR MUSCLE SPASMS  Dispense: 60 tablet; Refill: 0    Return in about 3 months (around 09/30/2020) for Chronic condition visit. More than 40 minutes spent with patient discussing medical conditions, demonstrated stretches to help with ongoing discomfort, discussed treatment options for bursitis, charting, lab review.    Micheline Rough, MD

## 2020-06-30 NOTE — Patient Instructions (Addendum)
Increase pravastatin to 40mg  nightly. Stretch twice daily with belt under ball of foot - straight up and then pull belt with one hand to each side. Also try figure four stretch.  Hip Bursitis  Hip bursitis is inflammation of a fluid-filled sac (bursa) in the hip joint. The bursa prevents the bones in the hip joint from rubbing against each other. Hip bursitis can cause mild to moderate pain, and symptoms often come and go over time. What are the causes? This condition may be caused by:  Injury to the hip.  Overuse of the muscles that surround the hip joint.  Previous injury or surgery of the hip.  Arthritis or gout.  Diabetes.  Thyroid disease.  Infection. In some cases, the cause may not be known. What are the signs or symptoms? Symptoms of this condition include:  Mild or moderate pain in the hip area. Pain may get worse with movement.  Tenderness and swelling of the hip, especially on the outer side of the hip.  In rare cases, the bursa may become infected. This may cause a fever, as well as warmth and redness in the area. Symptoms may come and go. How is this diagnosed? This condition may be diagnosed based on:  A physical exam.  Your medical history.  X-rays.  Removal of fluid from your inflamed bursa for testing (biopsy). You may be sent to a health care provider who specializes in bone diseases (orthopedist) or a provider who specializes in joint inflammation (rheumatologist). How is this treated? This condition is treated by resting, icing, applying pressure (compression), and raising (elevating) the injured area. This is called RICE treatment. In some cases, this may be enough to make your symptoms go away. Treatment may also include:  Using crutches.  Draining fluid out of the bursa to help relieve swelling.  Injecting medicine that helps to reduce inflammation (cortisone).  Additional medicines if the bursa is infected. Follow these instructions at  home: Managing pain, stiffness, and swelling   If directed, put ice on the painful area. ? Put ice in a plastic bag. ? Place a towel between your skin and the bag. ? Leave the ice on for 20 minutes, 2-3 times a day. ? Raise (elevate) your hip above the level of your heart as much as you can without pain. To do this, try putting a pillow under your hips while you lie down. Activity  Return to your normal activities as told by your health care provider. Ask your health care provider what activities are safe for you.  Rest and protect your hip as much as possible until your pain and swelling get better. General instructions  Take over-the-counter and prescription medicines only as told by your health care provider.  Wear compression wraps only as told by your health care provider.  Do not use your hip to support your body weight until your health care provider says that you can. Use crutches as told by your health care provider.  Gently massage and stretch your injured area as often as is comfortable.  Keep all follow-up visits as told by your health care provider. This is important. How is this prevented?  Exercise regularly, as told by your health care provider.  Warm up and stretch before being active.  Cool down and stretch after being active.  If an activity irritates your hip or causes pain, avoid the activity as much as possible.  Avoid sitting down for long periods at a time. Contact a health care  provider if you:  Have a fever.  Develop new symptoms.  Have difficulty walking or doing everyday activities.  Have pain that gets worse or does not get better with medicine.  Develop red skin or a feeling of warmth in your hip area. Get help right away if you:  Cannot move your hip.  Have severe pain. Summary  Hip bursitis is inflammation of a fluid-filled sac (bursa) in the hip joint.  Hip bursitis can cause mild to moderate pain, and symptoms often come and go  over time.  This condition is treated with rest, ice, compression, elevation, and medicines. This information is not intended to replace advice given to you by your health care provider. Make sure you discuss any questions you have with your health care provider. Document Revised: 08/05/2018 Document Reviewed: 08/05/2018 Elsevier Patient Education  [place].

## 2020-07-02 ENCOUNTER — Encounter: Payer: Self-pay | Admitting: Family Medicine

## 2020-07-02 ENCOUNTER — Ambulatory Visit: Payer: 59 | Admitting: Family Medicine

## 2020-07-02 ENCOUNTER — Other Ambulatory Visit: Payer: Self-pay

## 2020-07-02 VITALS — BP 106/80 | HR 84 | Ht 64.0 in | Wt 218.4 lb

## 2020-07-02 DIAGNOSIS — M25551 Pain in right hip: Secondary | ICD-10-CM

## 2020-07-02 DIAGNOSIS — S73191A Other sprain of right hip, initial encounter: Secondary | ICD-10-CM

## 2020-07-02 NOTE — Patient Instructions (Signed)
Thank you for coming in today. Send me a message on Aug 12 reminding me to look at your MRI  Results.  Keep a copy of the images on a CD.  Send the results to Dr Mayer Camel and Greta Doom as well.

## 2020-07-02 NOTE — Progress Notes (Signed)
   I, [name], LAT, ATC, am serving as scribe for Dr. [name].  Lori [name] is a 54 y.o. female who presents to [place] at [facility] today for f/u of R hip/thigh pain.  She was last seen by Dr. [name] on 06/01/20 w/ worsening R hip/thigh pain and reviewed her CT pelvis she had on 05/27/20.  She has seen Dr. Erlinda Hong at [facility] and Dr. Mayer Camel at [facility].  She is being followed by pain management and takes Oxycodone.  Since her last visit, pt reports that her R hip is feeling about the same since having a R hip injection at [facility].  She has a lumbar MRI scheduled for 07/21/20.  She locates the majority of her pain to her R ant thigh, R lateral hip/glute.  Diagnostic imaging: CT pelvis- 05/27/20; R hip MRI- 04/19/20; R hip XR- 03/04/20   Pertinent review of systems: No fevers or chills  Relevant historical information: No fevers or chills   Exam:  BP 106/80 (BP Location: Right Arm, Patient Position: Sitting, Cuff Size: Large)   Pulse 84   Ht 5\' 4"  (1.626 m)   Wt (!) 218 lb 6.4 oz (99.1 kg)   LMP 09/27/2016 (Approximate)   SpO2 98%   BMI 37.49 kg/m  General: Well Developed, well nourished, and in no acute distress.   MSK: Right hip normal-appearing decreased motion.     Assessment and Plan: 54 y.o. female with right hip and leg pain.  Unclear how much of her pain is truly due to interarticular hip pain.  She had partial response to previous interarticular injection.  Orthopedics would like repeat lumbar MRI to look for L1-2 or 3 radiculopathy on right side to explain some of her right hip leg and groin pain.  This is reasonable and has already been ordered and scheduled for August 11.  The results should also come to me.  I will make sure results get sent to her orthopedic surgeon Dr. Mayer Camel and her [facility] doctor who is performing injections in her lumbar spine.   PDMP not reviewed this encounter. No orders of the defined types were  placed in this encounter.  No orders of the defined types were placed in this encounter.    Discussed warning signs or symptoms. Please see discharge instructions. Patient expresses understanding.   The above documentation has been reviewed and is accurate and complete [name], M.D.  Total encounter time 20 minutes including charting time date of service.

## 2020-07-07 ENCOUNTER — Telehealth: Payer: Self-pay | Admitting: Family Medicine

## 2020-07-07 NOTE — Telephone Encounter (Signed)
Returned call and left message informing pt that I have not seen any disability paperwork come through.  Advise her to let her employer know so they can re-fax paperwork to [name] at [phone].

## 2020-07-07 NOTE — Telephone Encounter (Signed)
Patient called stating that her employer sent over disability paperwork but they have received anything back. Can you confirm is this has been received?

## 2020-07-08 NOTE — Telephone Encounter (Signed)
Forms received. Placed in Dr [employer] box for completion.

## 2020-07-12 ENCOUNTER — Encounter (INDEPENDENT_AMBULATORY_CARE_PROVIDER_SITE_OTHER): Payer: Self-pay | Admitting: Bariatrics

## 2020-07-12 ENCOUNTER — Ambulatory Visit (INDEPENDENT_AMBULATORY_CARE_PROVIDER_SITE_OTHER): Payer: 59 | Admitting: Bariatrics

## 2020-07-12 ENCOUNTER — Other Ambulatory Visit: Payer: Self-pay

## 2020-07-12 VITALS — BP 113/75 | HR 90 | Temp 98.2°F | Ht 64.0 in | Wt 209.0 lb

## 2020-07-12 DIAGNOSIS — E7849 Other hyperlipidemia: Secondary | ICD-10-CM

## 2020-07-12 DIAGNOSIS — I10 Essential (primary) hypertension: Secondary | ICD-10-CM

## 2020-07-12 DIAGNOSIS — Z9189 Other specified personal risk factors, not elsewhere classified: Secondary | ICD-10-CM

## 2020-07-12 DIAGNOSIS — E559 Vitamin D deficiency, unspecified: Secondary | ICD-10-CM

## 2020-07-12 DIAGNOSIS — E1169 Type 2 diabetes mellitus with other specified complication: Secondary | ICD-10-CM

## 2020-07-12 DIAGNOSIS — E669 Obesity, unspecified: Secondary | ICD-10-CM

## 2020-07-12 DIAGNOSIS — Z6835 Body mass index (BMI) 35.0-35.9, adult: Secondary | ICD-10-CM

## 2020-07-12 DIAGNOSIS — R7401 Elevation of levels of liver transaminase levels: Secondary | ICD-10-CM

## 2020-07-12 MED ORDER — VITAMIN D (ERGOCALCIFEROL) 1.25 MG (50000 UNIT) PO CAPS: 50000.0000 [IU] | ORAL_CAPSULE | ORAL | 0 refills | Status: DC

## 2020-07-12 NOTE — Progress Notes (Signed)
Chief Complaint:   [place] is here to discuss her progress with her obesity treatment plan along with follow-up of her obesity related diagnoses. Laraine is on the Category 3 Plan and states she is following her eating plan approximately 80% of the time. Allison states she is walking 30 minutes 5 times per week.  Today's visit was #: 2 Starting weight: 216 lbs Starting date: 06/28/2020 Today's weight: 209 lbs Today's date: 07/12/2020 Total lbs lost to date: 7 Total lbs lost since last in-office visit: 7  Interim History: Jhordan is down 7 lbs. She struggled slightly with breakfast. She has an MRI for lower back and hip pain scheduled for next week.  Subjective:   Diabetes mellitus type 2 in obese ([place]). Katyana is taking metformin.   Lab Results  Component Value Date   HGBA1C 6.1 (H) 06/28/2020   HGBA1C 7.0 (H) 02/13/2020   HGBA1C 6.6 (H) 10/27/2019   Lab Results  Component Value Date   MICROALBUR 10.1 (H) 02/13/2020   LDLCALC 125 (H) 06/28/2020   CREATININE 0.68 06/28/2020   Lab Results  Component Value Date   INSULIN 30.7 (H) 06/28/2020   Other hyperlipidemia. Triglycerides are elevated at 164. She is on a statin - Pravachol (not controlled; low potency).   Lab Results  Component Value Date   CHOL 196 06/28/2020   HDL 42 06/28/2020   LDLCALC 125 (H) 06/28/2020   LDLDIRECT 169.0 02/13/2020   TRIG 164 (H) 06/28/2020   CHOLHDL 7 02/13/2020   Lab Results  Component Value Date   ALT 41 (H) 06/28/2020   AST 30 06/28/2020   ALKPHOS 89 06/28/2020   BILITOT 0.3 06/28/2020   The 10-year ASCVD risk score Mikey Bussing DC Jr., et al., 2013) is: 12%   Values used to calculate the score:     Age: 66 years     Sex: Female     Is Non-Hispanic African American: No     Diabetic: Yes     Tobacco smoker: Yes     Systolic Blood Pressure: 951 mmHg     Is BP treated: Yes     HDL Cholesterol: 42 mg/dL     Total Cholesterol: 196 mg/dL  Essential hypertension. Ahlam is  taking HCTZ.  BP Readings from Last 3 Encounters:  07/12/20 113/75  07/02/20 106/80  06/30/20 118/72   Lab Results  Component Value Date   CREATININE 0.68 06/28/2020   CREATININE 0.71 02/13/2020   CREATININE 0.71 02/02/2020   Vitamin D deficiency. No nausea, vomiting, or muscle weakness.    Ref. Range 06/28/2020 14:37  Vitamin D, 25-Hydroxy Latest Ref Range: 30.0 - 100.0 ng/mL 32.7   Elevated alanine aminotransferase (ALT) level. Aparna has a history of fatty liver.   Ref. Range 06/28/2020 14:37  ALT Latest Ref Range: 0 - 32 IU/L 41 (H)   At risk for activity intolerance. Jayra is at risk of exercise intolerance due to obesity.  Assessment/Plan:   Diabetes mellitus type 2 in obese ([place]). Good blood sugar control is important to decrease the likelihood of diabetic complications such as nephropathy, neuropathy, limb loss, blindness, coronary artery disease, and death. Intensive lifestyle modification including diet, exercise and weight loss are the first line of treatment for diabetes. Barbee will continue her medication as directed.   Other hyperlipidemia. Cardiovascular risk and specific lipid/LDL goals reviewed.  We discussed several lifestyle modifications today and Adreonna will continue to work on diet, exercise and weight loss efforts. Orders and  follow up as documented in patient record. Lareta will follow-up with her PCP. She will increase her Pravachol to 40 mg and take as directed.   Counseling Intensive lifestyle modifications are the first line treatment for this issue. . Dietary changes: Increase soluble fiber. Decrease simple carbohydrates. . Exercise changes: Moderate to vigorous-intensity aerobic activity 150 minutes per week if tolerated. . Lipid-lowering medications: see documented in medical record.  Essential hypertension. Briele is working on healthy weight loss and exercise to improve blood pressure control. We will watch for signs of hypotension as she continues her  lifestyle modifications. She will continue her medication as directed.   Vitamin D deficiency. Low Vitamin D level contributes to fatigue and are associated with obesity, breast, and colon cancer. She was given a prescription for Vitamin D, Ergocalciferol, (DRISDOL) 1.25 MG (50000 UNIT) CAPS capsule every week #4 with 0 refills and will follow-up for routine testing of Vitamin D, at least 2-3 times per year to avoid over-replacement.   Elevated alanine aminotransferase (ALT) level. [place] will continue to work on diet and exercise.  At risk for activity intolerance. Justyn was given approximately 15 minutes of exercise intolerance counseling today. She is 54 y.o. female and has risk factors exercise intolerance including obesity. We discussed intensive lifestyle modifications today with an emphasis on specific weight loss instructions and strategies. Harlee will slowly increase activity as tolerated.  Repetitive spaced learning was employed today to elicit superior memory formation and behavioral change.  Class 2 severe obesity with serious comorbidity and body mass index (BMI) of 35.0 to 35.9 in adult, unspecified obesity type ([place]).  Toree is currently in the action stage of change. As such, her goal is to continue with weight loss efforts. She has agreed to the Category 3 Plan.   She will work on meal planning, increasing her water intake, and increasing her meat (protein) and vegetable intake.  Exercise goals: All adults should avoid inactivity. Some physical activity is better than none, and adults who participate in any amount of physical activity gain some health benefits.  Behavioral modification strategies: increasing lean protein intake, decreasing simple carbohydrates, increasing vegetables, increasing water intake, decreasing eating out, no skipping meals, meal planning and cooking strategies, keeping healthy foods in the home and planning for success.  Oluwadara has agreed to follow-up with  our clinic in 2 weeks. She was informed of the importance of frequent follow-up visits to maximize her success with intensive lifestyle modifications for her multiple health conditions.   Objective:   Blood pressure 113/75, pulse 90, temperature 98.2 F (36.8 C), height 5\' 4"  (1.626 m), weight (!) 209 lb (94.8 kg), last menstrual period 09/27/2016, SpO2 98 %. Body mass index is 35.87 kg/m.  General: Cooperative, alert, well developed, in no acute distress. HEENT: Conjunctivae and lids unremarkable. Cardiovascular: Regular rhythm.  Lungs: Normal work of breathing. Neurologic: No focal deficits.   Lab Results  Component Value Date   CREATININE 0.68 06/28/2020   BUN 12 06/28/2020   NA 142 06/28/2020   K 4.7 06/28/2020   CL 102 06/28/2020   CO2 27 06/28/2020   Lab Results  Component Value Date   ALT 41 (H) 06/28/2020   AST 30 06/28/2020   ALKPHOS 89 06/28/2020   BILITOT 0.3 06/28/2020   Lab Results  Component Value Date   HGBA1C 6.1 (H) 06/28/2020   HGBA1C 7.0 (H) 02/13/2020   HGBA1C 6.6 (H) 10/27/2019   Lab Results  Component Value Date   INSULIN 30.7 (H)  06/28/2020   Lab Results  Component Value Date   TSH 1.300 06/28/2020   Lab Results  Component Value Date   CHOL 196 06/28/2020   HDL 42 06/28/2020   LDLCALC 125 (H) 06/28/2020   LDLDIRECT 169.0 02/13/2020   TRIG 164 (H) 06/28/2020   CHOLHDL 7 02/13/2020   Lab Results  Component Value Date   WBC 11.0 (H) 02/13/2020   HGB 14.2 02/13/2020   HCT 42.2 02/13/2020   MCV 96.0 02/13/2020   PLT 210.0 02/13/2020   Lab Results  Component Value Date   IRON 200 (H) 12/11/2019   TIBC 296 10/27/2019   FERRITIN 216.0 12/11/2019   Attestation Statements:   Reviewed by clinician on day of visit: allergies, medications, problem list, medical history, surgical history, family history, social history, and previous encounter notes.  Migdalia Dk, am acting as Location manager for [employer], DO   I have reviewed  the above documentation for accuracy and completeness, and I agree with the above. Jearld Lesch, DO

## 2020-07-14 ENCOUNTER — Telehealth: Payer: Self-pay | Admitting: Family Medicine

## 2020-07-14 NOTE — Telephone Encounter (Signed)
Disability forms successfully faxed with OV notes to [phone]

## 2020-07-17 ENCOUNTER — Other Ambulatory Visit: Payer: Self-pay | Admitting: Family Medicine

## 2020-07-21 ENCOUNTER — Ambulatory Visit
Admission: RE | Admit: 2020-07-21 | Discharge: 2020-07-21 | Disposition: A | Discharge status: Home / Self Care | Payer: 59 | Source: Ambulatory Visit | Attending: Family Medicine | Admitting: Family Medicine

## 2020-07-21 ENCOUNTER — Other Ambulatory Visit: Payer: Self-pay

## 2020-07-21 DIAGNOSIS — G8929 Other chronic pain: Secondary | ICD-10-CM

## 2020-07-21 DIAGNOSIS — M5441 Lumbago with sciatica, right side: Secondary | ICD-10-CM

## 2020-07-23 ENCOUNTER — Telehealth: Payer: Self-pay | Admitting: Family Medicine

## 2020-07-23 NOTE — Telephone Encounter (Signed)
Pt returned your call and would like for you to call her back.

## 2020-07-23 NOTE — Telephone Encounter (Signed)
See results note. 

## 2020-07-28 ENCOUNTER — Ambulatory Visit: Payer: 59 | Admitting: Family Medicine

## 2020-07-28 ENCOUNTER — Other Ambulatory Visit: Payer: Self-pay

## 2020-07-28 ENCOUNTER — Encounter: Payer: Self-pay | Admitting: Family Medicine

## 2020-07-28 VITALS — BP 112/78 | HR 87 | Ht 64.0 in | Wt 211.6 lb

## 2020-07-28 DIAGNOSIS — M25551 Pain in right hip: Secondary | ICD-10-CM

## 2020-07-28 NOTE — Progress Notes (Signed)
I, Lori [name], LAT, ATC, am serving as scribe for Dr. [name].  Lori [name] is a 54 y.o. female who presents to [place] at [facility] today for f/u of R hip/thigh pain and L-spine MRI review.  She was last seen by Dr. [name] on 07/02/20 and he agreed w/ plan to get L-spine MRI to rule-in or -out possible lumbar radiculopathy as cause of pain.  Since her last visit, pt states that her symptoms have not changed.  She con't to have low back pain and pain radiating into her R hip and thigh to her knee.  She is also reporting some pain at her R ischial tuberosity. Main pain however remains in her right groin.  Diagnostic imaging: L-spine MRI- 07/21/20; CT pelvis- 05/27/20; R hip MRI- 04/19/20; R hip XR- 03/04/20   Pertinent review of systems: No fevers or chills  Relevant historical information: Hypertension   Exam:  BP 112/78 (BP Location: Right Arm, Patient Position: Sitting, Cuff Size: Large)   Pulse 87   Ht 5\' 4"  (1.626 m)   Wt 211 lb 9.6 oz (96 kg)   LMP 09/27/2016 (Approximate)   SpO2 97%   BMI 36.32 kg/m  General: Well Developed, well nourished, and in no acute distress.   MSK: L-spine nontender.  Normal motion. Right hip decreased motion.    Lab and Radiology Results EXAM: MRI LUMBAR SPINE WITHOUT CONTRAST  TECHNIQUE: Multiplanar, multisequence MR imaging of the lumbar spine was performed. No intravenous contrast was administered.  COMPARISON:  2019  FINDINGS: Segmentation:  Standard.  Alignment:  Anteroposterior alignment is maintained.  Vertebrae: Vertebral body heights are preserved. There is degenerative endplate marrow edema at L2-L3 and too a much lesser extent at L3-L4. There is no suspicious osseous lesion.  Conus medullaris and cauda equina: Conus extends to the L1 level. Conus and cauda equina appear normal.  Paraspinal and other soft tissues: Unremarkable.  Disc levels:  L1-L2:  No canal or foraminal  stenosis.  L2-L3: Disc bulge with new small right foraminal protrusion. No canal or left foraminal stenosis. New mild right foraminal stenosis.  L3-L4: Disc bulge and mild facet arthropathy. Increased mild canal stenosis. No foraminal stenosis.  L4-L5: Disc bulge with small central disc protrusion and mild facet arthropathy. Mild canal stenosis. No foraminal stenosis.  L5-S1: Prior right laminotomy. Disc bulge with endplate osteophytic ridging and mild facet arthropathy. No canal stenosis. Mild foraminal stenosis.  IMPRESSION: Multilevel degenerative changes as detailed above with mild progression since 2019. No high-grade stenosis. There is degenerative endplate marrow edema eccentric to the right at L2-L3 where there is a new small foraminal protrusion.   Electronically Signed   By: Macy Mis M.D.   On: 07/21/2020 15:26 I, [name], personally (independently) visualized and performed the interpretation of the images attached in this note.      Assessment and Plan: 54 y.o. female with right hip pain.  Patient does not have severe stenosis or obvious nerve compression on her most recent lumbar MRI to explain her right hip pain.  I believe this is still likely due to labral tear seen on MRI of hip from May of this year. Discussed with patient.  However I think it is reasonable to proceed with trial of epidural steroid injection at right L2-3.  If she has some pain relief from this it may be worthwhile pursuing this.  However if no benefit will likely recommend return back to orthopedic surgery to continue to push  for total hip replacement.  She already sees a pain management specialist who does her injections Dr. Greta Doom.  Recommend patient contact Dr. Greta Doom to proceed with epidural steroid injection.  Check back with me as needed.    Discussed warning signs or symptoms. Please see discharge instructions. Patient expresses understanding.   The above documentation  has been reviewed and is accurate and complete [name], M.D.  Total encounter time 20 minutes including charting time date of service.

## 2020-07-28 NOTE — Patient Instructions (Signed)
Thank you for coming in today. I think the pain in your hip is probably not coming from your back.  I think it is your hip.  Regardless it is reasonable for your back epidural steroid injection to be done at right L2-L3 to address right groin and thigh pain.  If we remain convinced that your groin pain is coming from your hip and you are not controlled then I would recommend that you continue to proceed to hip replacement.

## 2020-07-29 ENCOUNTER — Other Ambulatory Visit: Payer: Self-pay

## 2020-07-29 ENCOUNTER — Ambulatory Visit (INDEPENDENT_AMBULATORY_CARE_PROVIDER_SITE_OTHER): Payer: 59 | Admitting: Bariatrics

## 2020-07-29 ENCOUNTER — Encounter (INDEPENDENT_AMBULATORY_CARE_PROVIDER_SITE_OTHER): Payer: Self-pay | Admitting: Bariatrics

## 2020-07-29 VITALS — BP 121/82 | HR 90 | Temp 98.0°F | Ht 64.0 in | Wt 209.0 lb

## 2020-07-29 DIAGNOSIS — Z6835 Body mass index (BMI) 35.0-35.9, adult: Secondary | ICD-10-CM

## 2020-07-29 DIAGNOSIS — E559 Vitamin D deficiency, unspecified: Secondary | ICD-10-CM | POA: Diagnosis not present

## 2020-07-29 DIAGNOSIS — I10 Essential (primary) hypertension: Secondary | ICD-10-CM | POA: Diagnosis not present

## 2020-07-29 DIAGNOSIS — E66812 Obesity, class 2: Secondary | ICD-10-CM

## 2020-07-29 DIAGNOSIS — K5909 Other constipation: Secondary | ICD-10-CM

## 2020-07-29 DIAGNOSIS — Z9189 Other specified personal risk factors, not elsewhere classified: Secondary | ICD-10-CM | POA: Diagnosis not present

## 2020-07-29 MED ORDER — VITAMIN D (ERGOCALCIFEROL) 1.25 MG (50000 UNIT) PO CAPS: 50000.0000 [IU] | ORAL_CAPSULE | ORAL | 0 refills | Status: DC

## 2020-08-02 ENCOUNTER — Encounter (INDEPENDENT_AMBULATORY_CARE_PROVIDER_SITE_OTHER): Payer: Self-pay | Admitting: Bariatrics

## 2020-08-02 NOTE — Progress Notes (Signed)
Chief Complaint:   Lori [name] is here to discuss her progress with her obesity treatment plan along with follow-up of her obesity related diagnoses. Lori [name] is on the Category 3 Plan and states she is following her eating plan approximately 50% of the time. Ana states she is exercising 0 minutes 0 times per week.  Today's visit was #: 3 Starting weight: 216 lbs Starting date: 06/28/2020 Today's weight: 209 lbs Today's date: 07/29/2020 Total lbs lost to date: 7 Total lbs lost since last in-office visit: 0  Interim History: Lori [name]'s weight remains the same. She has attended several birthday parties. She reports having some back pain and is seeing Pain Management. She reports doing better with her water intake.  Subjective:   Essential hypertension. [name] is taking HCTZ and Cozaar. Blood pressure is slightly elevated at 121/82.  BP Readings from Last 3 Encounters:  07/29/20 121/82  07/28/20 112/78  07/12/20 113/75   Lab Results  Component Value Date   CREATININE 0.68 06/28/2020   CREATININE 0.71 02/13/2020   CREATININE 0.71 02/02/2020   Vitamin D deficiency. No nausea, vomiting, or muscle weakness.    Ref. Range 06/28/2020 14:37  Vitamin D, 25-Hydroxy Latest Ref Range: 30.0 - 100.0 ng/mL 32.7   Other constipation. Lori [name] denies retained stools.  At risk for osteoporosis. Lori [name] is at higher risk of osteopenia and osteoporosis due to Vitamin D deficiency.   Assessment/Plan:   Essential hypertension. [name] is working on healthy weight loss and exercise to improve blood pressure control. We will watch for signs of hypotension as she continues her lifestyle modifications. She will continue her medications as directed.   Vitamin D deficiency. Low Vitamin D level contributes to fatigue and are associated with obesity, breast, and colon cancer. She was given a prescription for Vitamin D, Ergocalciferol, (DRISDOL) 1.25 MG (50000 UNIT) CAPS capsule every week #4 with 0  refills and will follow-up for routine testing of Vitamin D, at least 2-3 times per year to avoid over-replacement.   Other constipation. [name] was informed that a decrease in bowel movement frequency is normal while losing weight, but stools should not be hard or painful. Orders and follow up as documented in patient record. She will use Miralax and increase her intake of wheat bran, flaxseed, and water.  Counseling Getting to Good Bowel Health: Your goal is to have one soft bowel movement each day. Drink at least 8 glasses of water each day. Eat plenty of fiber (goal is over 25 grams each day). It is best to get most of your fiber from dietary sources which includes leafy green vegetables, fresh fruit, and whole grains. You may need to add fiber with the help of OTC fiber supplements. These include Metamucil, Citrucel, and Flaxseed. If you are still having trouble, try adding Miralax or Magnesium Citrate. If all of these changes do not work, Cabin crew.  At risk for osteoporosis. Lori [name] was given approximately 15 minutes of osteoporosis prevention counseling today. Lori [name] is at risk for osteopenia and osteoporosis due to her Vitamin D deficiency. She was encouraged to take her Vitamin D and follow her higher calcium diet and increase strengthening exercise to help strengthen her bones and decrease her risk of osteopenia and osteoporosis.  Repetitive spaced learning was employed today to elicit superior memory formation and behavioral change.  Class 2 severe obesity with serious comorbidity and body mass index (BMI) of 35.0 to 35.9 in adult, unspecified obesity type ([place]).  Lori [name] is  currently in the action stage of change. As such, her goal is to continue with weight loss efforts. She has agreed to the Category 3 Plan.   She will work on meal planning, intentional eating, adhering to the plan, and not skipping meals.  Exercise goals: Lori [name] will continue with pain management; movement as  tolerated.  Behavioral modification strategies: increasing lean protein intake, decreasing simple carbohydrates, increasing vegetables, increasing water intake, decreasing eating out, no skipping meals, meal planning and cooking strategies, keeping healthy foods in the home and planning for success.  [name] has agreed to follow-up with our clinic in 2-3 weeks. She was informed of the importance of frequent follow-up visits to maximize her success with intensive lifestyle modifications for her multiple health conditions.   Objective:   Blood pressure 121/82, pulse 90, temperature 98 F (36.7 C), height 5\' 4"  (1.626 m), weight 209 lb (94.8 kg), last menstrual period 09/27/2016, SpO2 98 %. Body mass index is 35.87 kg/m.  General: Cooperative, alert, well developed, in no acute distress. HEENT: Conjunctivae and lids unremarkable. Cardiovascular: Regular rhythm.  Lungs: Normal work of breathing. Neurologic: No focal deficits.   Lab Results  Component Value Date   CREATININE 0.68 06/28/2020   BUN 12 06/28/2020   NA 142 06/28/2020   K 4.7 06/28/2020   CL 102 06/28/2020   CO2 27 06/28/2020   Lab Results  Component Value Date   ALT 41 (H) 06/28/2020   AST 30 06/28/2020   ALKPHOS 89 06/28/2020   BILITOT 0.3 06/28/2020   Lab Results  Component Value Date   HGBA1C 6.1 (H) 06/28/2020   HGBA1C 7.0 (H) 02/13/2020   HGBA1C 6.6 (H) 10/27/2019   Lab Results  Component Value Date   INSULIN 30.7 (H) 06/28/2020   Lab Results  Component Value Date   TSH 1.300 06/28/2020   Lab Results  Component Value Date   CHOL 196 06/28/2020   HDL 42 06/28/2020   LDLCALC 125 (H) 06/28/2020   LDLDIRECT 169.0 02/13/2020   TRIG 164 (H) 06/28/2020   CHOLHDL 7 02/13/2020   Lab Results  Component Value Date   WBC 11.0 (H) 02/13/2020   HGB 14.2 02/13/2020   HCT 42.2 02/13/2020   MCV 96.0 02/13/2020   PLT 210.0 02/13/2020   Lab Results  Component Value Date   IRON 200 (H) 12/11/2019   TIBC 296  10/27/2019   FERRITIN 216.0 12/11/2019   Attestation Statements:   Reviewed by clinician on day of visit: allergies, medications, problem list, medical history, surgical history, family history, social history, and previous encounter notes.  Migdalia Dk, am acting as Location manager for [employer], DO   I have reviewed the above documentation for accuracy and completeness, and I agree with the above. Jearld Lesch, DO

## 2020-08-03 ENCOUNTER — Encounter: Payer: Self-pay | Admitting: Family Medicine

## 2020-08-05 ENCOUNTER — Other Ambulatory Visit (INDEPENDENT_AMBULATORY_CARE_PROVIDER_SITE_OTHER): Payer: Self-pay | Admitting: Bariatrics

## 2020-08-05 DIAGNOSIS — E559 Vitamin D deficiency, unspecified: Secondary | ICD-10-CM

## 2020-08-09 ENCOUNTER — Other Ambulatory Visit: Payer: Self-pay | Admitting: Family Medicine

## 2020-08-09 DIAGNOSIS — I10 Essential (primary) hypertension: Secondary | ICD-10-CM

## 2020-08-11 ENCOUNTER — Encounter: Payer: Self-pay | Admitting: Family Medicine

## 2020-08-11 ENCOUNTER — Telehealth: Payer: Self-pay | Admitting: Family Medicine

## 2020-08-11 NOTE — Telephone Encounter (Signed)
Pt called. She is currently on disability through work and will be still on disability when she gets the epidural and goes on her honeymoon. Because she is personally travelling while on disability, she is requesting a "travel letter" from Dr. [name] that states her personal travel following the epidural should have no impact on the outcome of the epidural. Epidural is 9/13, wedding and honeymoon start 9/18, recheck with [name] 9/28.

## 2020-08-11 NOTE — Telephone Encounter (Signed)
Tried to call pt back to get more info as I'm confused by this message but reception was bad and couldn't connect with her.

## 2020-08-11 NOTE — Telephone Encounter (Signed)
Patient called stating that she is needing a travel level for when she goes on her honeymoon to the [employer] the week of September 19th. She said that they are potentially concerned that this could effect her results from her epidural on September 13th and to possibility of having a hip replacemet in the future.  She is scheduled for a follow up with Dr [name] on September 28th when she gets back.  She asked that this be emailed to [email or web address]

## 2020-08-12 ENCOUNTER — Encounter: Payer: Self-pay | Admitting: *Deleted

## 2020-08-12 ENCOUNTER — Encounter: Payer: Self-pay | Admitting: Family Medicine

## 2020-08-12 NOTE — Telephone Encounter (Signed)
Letter written and sent electronically.  Copy of letter also printed and signed and can be picked up if needed.  Additionally could mail the letter if needed.

## 2020-08-14 ENCOUNTER — Other Ambulatory Visit (INDEPENDENT_AMBULATORY_CARE_PROVIDER_SITE_OTHER): Payer: Self-pay | Admitting: Bariatrics

## 2020-08-14 DIAGNOSIS — E559 Vitamin D deficiency, unspecified: Secondary | ICD-10-CM

## 2020-08-19 ENCOUNTER — Encounter (INDEPENDENT_AMBULATORY_CARE_PROVIDER_SITE_OTHER): Payer: Self-pay | Admitting: Bariatrics

## 2020-08-19 ENCOUNTER — Other Ambulatory Visit: Payer: Self-pay

## 2020-08-19 ENCOUNTER — Ambulatory Visit (INDEPENDENT_AMBULATORY_CARE_PROVIDER_SITE_OTHER): Payer: 59 | Admitting: Bariatrics

## 2020-08-19 VITALS — BP 129/89 | HR 89 | Temp 97.9°F | Ht 64.0 in | Wt 205.0 lb

## 2020-08-19 DIAGNOSIS — E1169 Type 2 diabetes mellitus with other specified complication: Secondary | ICD-10-CM | POA: Diagnosis not present

## 2020-08-19 DIAGNOSIS — E669 Obesity, unspecified: Secondary | ICD-10-CM

## 2020-08-19 DIAGNOSIS — K5909 Other constipation: Secondary | ICD-10-CM | POA: Diagnosis not present

## 2020-08-19 DIAGNOSIS — E559 Vitamin D deficiency, unspecified: Secondary | ICD-10-CM

## 2020-08-19 DIAGNOSIS — Z9189 Other specified personal risk factors, not elsewhere classified: Secondary | ICD-10-CM | POA: Diagnosis not present

## 2020-08-19 DIAGNOSIS — Z6835 Body mass index (BMI) 35.0-35.9, adult: Secondary | ICD-10-CM

## 2020-08-19 MED ORDER — VITAMIN D (ERGOCALCIFEROL) 1.25 MG (50000 UNIT) PO CAPS: 50000.0000 [IU] | ORAL_CAPSULE | ORAL | 0 refills | Status: DC

## 2020-08-19 MED ORDER — ONETOUCH ULTRA VI STRP: ORAL_STRIP | 0 refills | Status: DC

## 2020-08-22 ENCOUNTER — Other Ambulatory Visit: Payer: Self-pay | Admitting: Family Medicine

## 2020-08-22 DIAGNOSIS — E1165 Type 2 diabetes mellitus with hyperglycemia: Secondary | ICD-10-CM

## 2020-08-22 DIAGNOSIS — E1169 Type 2 diabetes mellitus with other specified complication: Secondary | ICD-10-CM

## 2020-08-22 DIAGNOSIS — E785 Hyperlipidemia, unspecified: Secondary | ICD-10-CM

## 2020-08-24 NOTE — Progress Notes (Signed)
Chief Complaint:   OBESITY Lori [name] is here to discuss her progress with her obesity treatment plan along with follow-up of her obesity related diagnoses. Lori [name] is on the Category 3 Plan and states she is following her eating plan approximately 50% of the time. Lori [name] states she is exercising for 0 minutes 0 times per week.  Today's visit was #: 4 Starting weight: 216 lbs Starting date: 06/28/2020 Today's weight: 205 lbs Today's date: 08/19/2020 Total lbs lost to date: 11 lbs Total lbs lost since last in-office visit: 4 lbs  Interim History: Lori [name] is down 4 pounds since her last visit.  She is getting married next Saturday.  She is doing better with her water.  Subjective:   1. Vitamin D deficiency Lori [name]'s Vitamin D level was 32.7 on 06/28/2020. She is currently taking prescription vitamin D 50,000 IU each week. She denies nausea, vomiting or muscle weakness.  Minimal sunlight exposure.  2. Other constipation Retained stool.  3. Diabetes mellitus type 2 in obese Lori [name]) She is taking metformin.  Lab Results  Component Value Date   HGBA1C 6.1 (H) 06/28/2020   HGBA1C 7.0 (H) 02/13/2020   HGBA1C 6.6 (H) 10/27/2019   Lab Results  Component Value Date   MICROALBUR 10.1 (H) 02/13/2020   LDLCALC 125 (H) 06/28/2020   CREATININE 0.68 06/28/2020   Lab Results  Component Value Date   INSULIN 30.7 (H) 06/28/2020   4. At risk for hypoglycemia Lori [name] is at increased risk for hypoglycemia due to changes in diet, diagnosis of diabetes, with loss.  Assessment/Plan:   1. Vitamin D deficiency Low Vitamin D level contributes to fatigue and are associated with obesity, breast, and colon cancer. She agrees to continue to take prescription Vitamin D @50 ,000 IU every week and will follow-up for routine testing of Vitamin D, at least 2-3 times per year to avoid over-replacement.  -Refill Vitamin D, Ergocalciferol, (DRISDOL) 1.25 MG (50000 UNIT) CAPS capsule; Take 1 capsule (50,000 Units total)  by mouth every 7 (seven) days.  Dispense: 4 capsule; Refill: 0  2. Other constipation Lori [name] was informed that a decrease in bowel movement frequency is normal while losing weight, but stools should not be hard or painful. Orders and follow up as documented in patient record. Will use Colace stool softener and MiraLAX with water in the morning.  Counseling Getting to Good Bowel Health: Your goal is to have one soft bowel movement each day. Drink at least 8 glasses of water each day. Eat plenty of fiber (goal is over 25 grams each day). It is best to get most of your fiber from dietary sources which includes leafy green vegetables, fresh fruit, and whole grains. You may need to add fiber with the help of OTC fiber supplements. These include Metamucil, Citrucel, and Flaxseed. If you are still having trouble, try adding Miralax or Magnesium Citrate. If all of these changes do not work, Cabin crew.  3. Diabetes mellitus type 2 in obese (HCC) Good blood sugar control is important to decrease the likelihood of diabetic complications such as nephropathy, neuropathy, limb loss, blindness, coronary artery disease, and death. Intensive lifestyle modification including diet, exercise and weight loss are the first line of treatment for diabetes.  Continue medication.  - glucose blood (ONETOUCH ULTRA) test strip; USE TO TEST BLOOD SUGAR UP TO 4 TIMES A DAY  Dispense: 100 strip; Refill: 0  4. At risk for hypoglycemia Lori [name] was given approximately 15 minutes of counseling today regarding prevention  of hypoglycemia. She was advised of symptoms of hypoglycemia. Lori [name] was instructed to avoid skipping meals, eat regular protein rich meals and schedule low calorie snacks as needed.   Repetitive spaced learning was employed today to elicit superior memory formation and behavioral change  5. Class 2 severe obesity with serious comorbidity and body mass index (BMI) of 35.0 to 35.9 in adult, unspecified obesity  type (HCC) Lori [name] is currently in the action stage of change. As such, her goal is to continue with weight loss efforts. She has agreed to the Category 3 Plan.   Exercise goals: Resistance bands.  Behavioral modification strategies: increasing lean protein intake, decreasing simple carbohydrates, increasing vegetables, increasing water intake, decreasing eating out, no skipping meals, meal planning and cooking strategies, keeping healthy foods in the home and planning for success.  Lori [name] has agreed to follow-up with our clinic in 6 weeks. She was informed of the importance of frequent follow-up visits to maximize her success with intensive lifestyle modifications for her multiple health conditions.   Objective:   Blood pressure 129/89, pulse 89, temperature 97.9 F (36.6 C), height 5\' 4"  (1.626 m), weight 205 lb (93 kg), last menstrual period 09/27/2016, SpO2 98 %. Body mass index is 35.19 kg/m.  General: Cooperative, alert, well developed, in no acute distress. HEENT: Conjunctivae and lids unremarkable. Cardiovascular: Regular rhythm.  Lungs: Normal work of breathing. Neurologic: No focal deficits.   Lab Results  Component Value Date   CREATININE 0.68 06/28/2020   BUN 12 06/28/2020   NA 142 06/28/2020   K 4.7 06/28/2020   CL 102 06/28/2020   CO2 27 06/28/2020   Lab Results  Component Value Date   ALT 41 (H) 06/28/2020   AST 30 06/28/2020   ALKPHOS 89 06/28/2020   BILITOT 0.3 06/28/2020   Lab Results  Component Value Date   HGBA1C 6.1 (H) 06/28/2020   HGBA1C 7.0 (H) 02/13/2020   HGBA1C 6.6 (H) 10/27/2019   Lab Results  Component Value Date   INSULIN 30.7 (H) 06/28/2020   Lab Results  Component Value Date   TSH 1.300 06/28/2020   Lab Results  Component Value Date   CHOL 196 06/28/2020   HDL 42 06/28/2020   LDLCALC 125 (H) 06/28/2020   LDLDIRECT 169.0 02/13/2020   TRIG 164 (H) 06/28/2020   CHOLHDL 7 02/13/2020   Lab Results  Component Value Date   WBC 11.0  (H) 02/13/2020   HGB 14.2 02/13/2020   HCT 42.2 02/13/2020   MCV 96.0 02/13/2020   PLT 210.0 02/13/2020   Lab Results  Component Value Date   IRON 200 (H) 12/11/2019   TIBC 296 10/27/2019   FERRITIN 216.0 12/11/2019   Attestation Statements:   Reviewed by clinician on day of visit: allergies, medications, problem list, medical history, surgical history, family history, social history, and previous encounter notes.  I, Water quality scientist, CMA, am acting as Location manager for [employer], DO  I have reviewed the above documentation for accuracy and completeness, and I agree with the above. Jearld Lesch, DO

## 2020-08-25 ENCOUNTER — Encounter (INDEPENDENT_AMBULATORY_CARE_PROVIDER_SITE_OTHER): Payer: Self-pay | Admitting: Bariatrics

## 2020-09-05 ENCOUNTER — Other Ambulatory Visit: Payer: Self-pay | Admitting: Family Medicine

## 2020-09-05 DIAGNOSIS — I10 Essential (primary) hypertension: Secondary | ICD-10-CM

## 2020-09-07 ENCOUNTER — Ambulatory Visit: Payer: Self-pay

## 2020-09-07 ENCOUNTER — Ambulatory Visit (INDEPENDENT_AMBULATORY_CARE_PROVIDER_SITE_OTHER): Payer: 59 | Admitting: Family Medicine

## 2020-09-07 ENCOUNTER — Other Ambulatory Visit: Payer: Self-pay

## 2020-09-07 ENCOUNTER — Encounter: Payer: Self-pay | Admitting: Family Medicine

## 2020-09-07 VITALS — BP 140/84 | HR 85 | Ht 64.0 in | Wt 207.8 lb

## 2020-09-07 DIAGNOSIS — M25551 Pain in right hip: Secondary | ICD-10-CM

## 2020-09-07 NOTE — Patient Instructions (Signed)
Thank you for coming in today. Call or go to the ER if you develop a large red swollen joint with extreme pain or oozing puss.  Next step if this shot does not help is to see the speciality hip orthopedic surgeon at [facility] for labrum.   Keep me updated
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
## 2020-09-15 ENCOUNTER — Other Ambulatory Visit (INDEPENDENT_AMBULATORY_CARE_PROVIDER_SITE_OTHER): Payer: Self-pay | Admitting: Bariatrics

## 2020-09-15 DIAGNOSIS — E559 Vitamin D deficiency, unspecified: Secondary | ICD-10-CM

## 2020-09-21 ENCOUNTER — Other Ambulatory Visit: Payer: Self-pay | Admitting: Family Medicine

## 2020-09-21 ENCOUNTER — Other Ambulatory Visit (INDEPENDENT_AMBULATORY_CARE_PROVIDER_SITE_OTHER): Payer: Self-pay | Admitting: Bariatrics

## 2020-09-21 DIAGNOSIS — E1169 Type 2 diabetes mellitus with other specified complication: Secondary | ICD-10-CM

## 2020-09-21 MED ORDER — ONETOUCH ULTRA VI STRP: ORAL_STRIP | 0 refills | Status: DC

## 2020-09-22 ENCOUNTER — Encounter: Payer: Self-pay | Admitting: Family Medicine

## 2020-09-22 ENCOUNTER — Ambulatory Visit: Payer: 59 | Admitting: Family Medicine

## 2020-09-22 ENCOUNTER — Other Ambulatory Visit: Payer: Self-pay

## 2020-09-22 VITALS — BP 112/72 | HR 94 | Ht 64.0 in | Wt 208.4 lb

## 2020-09-22 DIAGNOSIS — S73191A Other sprain of right hip, initial encounter: Secondary | ICD-10-CM

## 2020-09-22 DIAGNOSIS — M25551 Pain in right hip: Secondary | ICD-10-CM
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
## 2020-09-22 NOTE — Patient Instructions (Addendum)
Thank you for coming in today.  Get your MRI hip and Lspine and CT scan Hip/pelvis on a CD and bring it with you to your appointment with Dr Aretha Parrot [facility]) or Dr Irish Elders ([facility]).  Helen at [employer] can make you a CD. [phone].   I would like to avoid steroids if I can.

## 2020-09-30 ENCOUNTER — Ambulatory Visit (INDEPENDENT_AMBULATORY_CARE_PROVIDER_SITE_OTHER): Payer: 59 | Admitting: Bariatrics

## 2020-10-01 ENCOUNTER — Encounter: Payer: Self-pay | Admitting: Family Medicine

## 2020-10-01 ENCOUNTER — Ambulatory Visit: Payer: 59 | Admitting: Family Medicine

## 2020-10-01 ENCOUNTER — Other Ambulatory Visit: Payer: Self-pay

## 2020-10-01 VITALS — BP 104/78 | HR 76 | Temp 98.3°F | Ht 64.0 in | Wt 210.2 lb

## 2020-10-01 DIAGNOSIS — E1165 Type 2 diabetes mellitus with hyperglycemia: Secondary | ICD-10-CM

## 2020-10-01 DIAGNOSIS — E1169 Type 2 diabetes mellitus with other specified complication: Secondary | ICD-10-CM | POA: Diagnosis not present

## 2020-10-01 DIAGNOSIS — F17200 Nicotine dependence, unspecified, uncomplicated: Secondary | ICD-10-CM | POA: Diagnosis not present

## 2020-10-01 DIAGNOSIS — E785 Hyperlipidemia, unspecified: Secondary | ICD-10-CM

## 2020-10-01 DIAGNOSIS — R41840 Attention and concentration deficit: Secondary | ICD-10-CM

## 2020-10-01 DIAGNOSIS — I10 Essential (primary) hypertension: Secondary | ICD-10-CM

## 2020-10-01 DIAGNOSIS — M25551 Pain in right hip: Secondary | ICD-10-CM | POA: Diagnosis not present
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
## 2020-10-02 ENCOUNTER — Encounter: Payer: Self-pay | Admitting: Family Medicine

## 2020-10-02 LAB — HEMOGLOBIN A1C
Hgb A1c MFr Bld: 5.8 % of total Hgb — ABNORMAL HIGH (ref <5.7)
Mean Plasma Glucose: 120 (calc)
eAG (mmol/L): 6.6 (calc)

## 2020-10-02 IMAGING — MG STEREOTACTIC VACUUM ASSIST RIGHT
8 of 17 series · 8 of 33 positions shown · non-contrast
Comparison: Previous exams.

Addendum:
CLINICAL DATA: 52-year-old female presenting for biopsy of
calcifications in the medial right breast.

EXAM:
RIGHT BREAST STEREOTACTIC CORE NEEDLE BIOPSY

[R (1 of 7)]
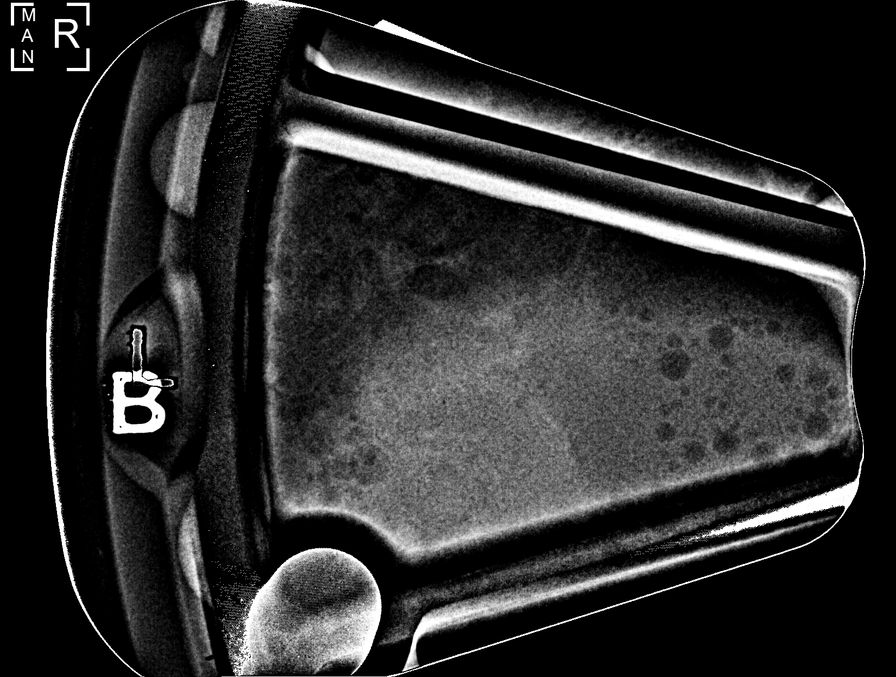

[R (2 of 7)]
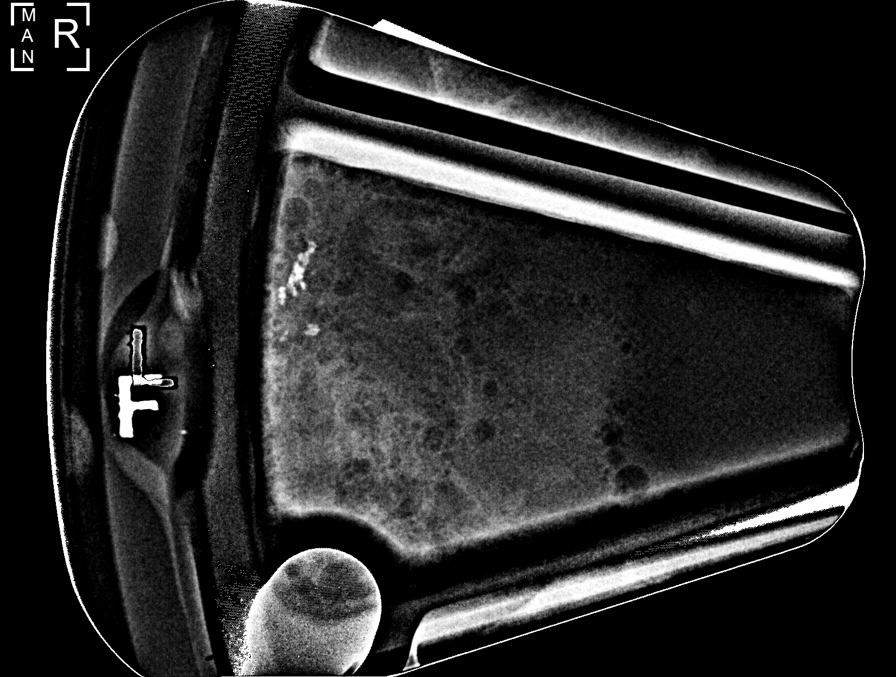

[R (3 of 7)]
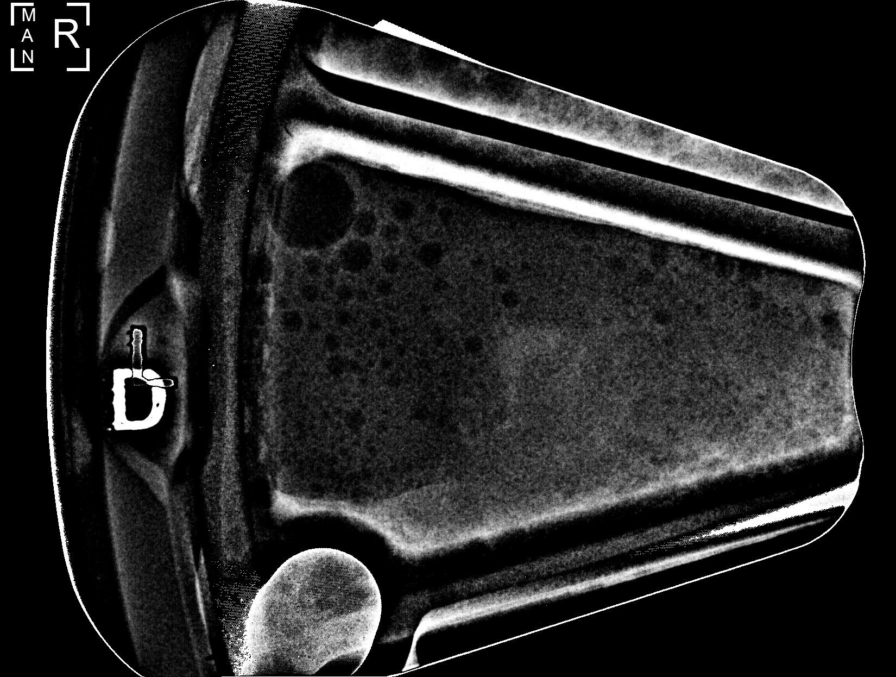

[R (4 of 7)]
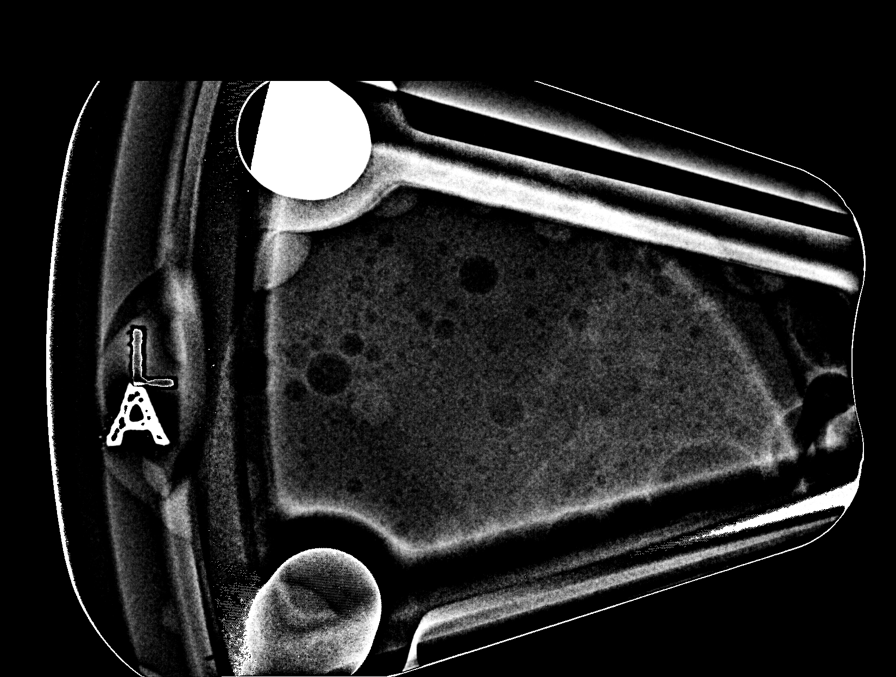

[R (5 of 7)]
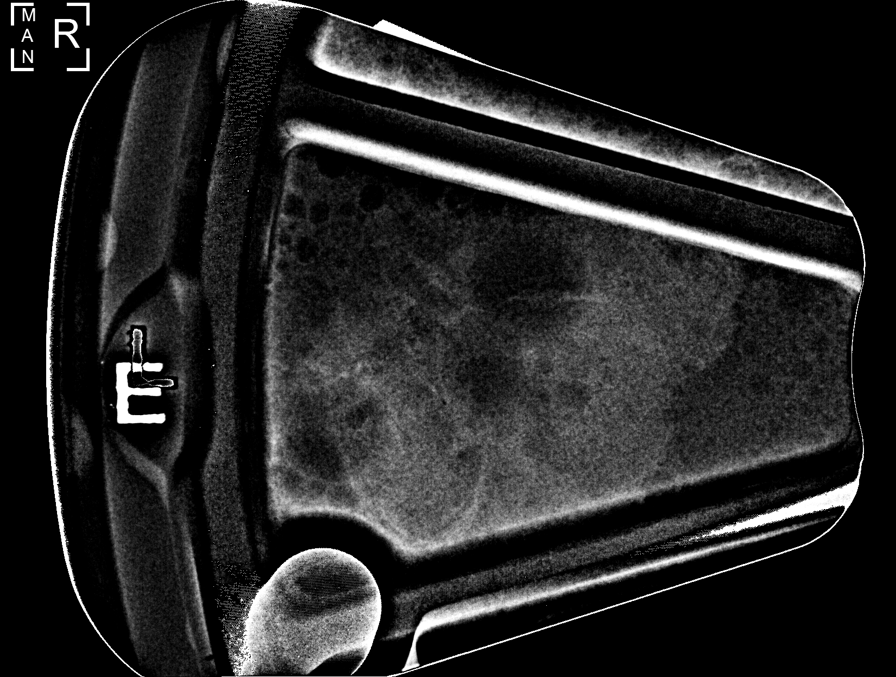

[R (6 of 7)]
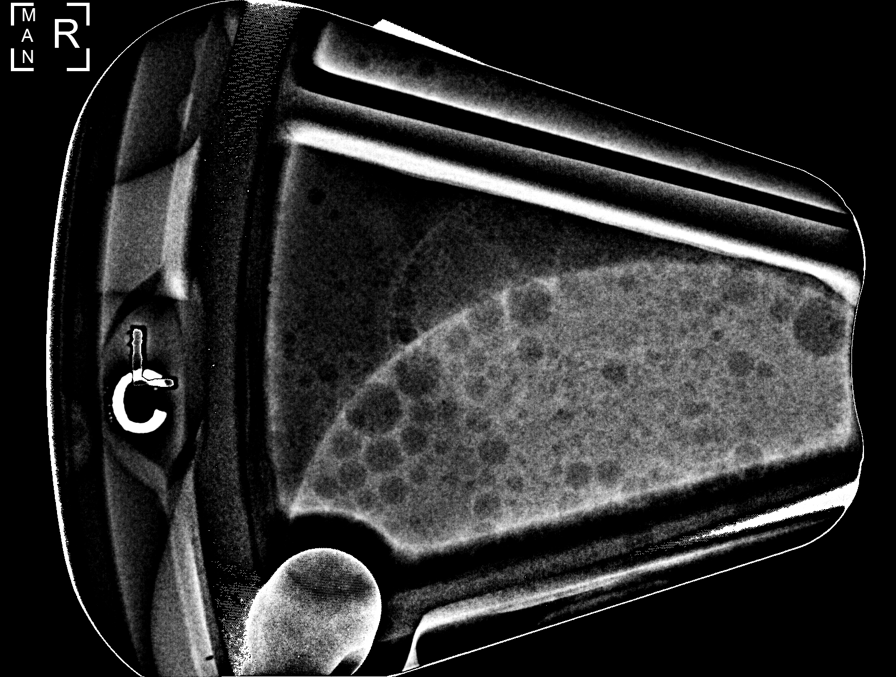

[R (7 of 7)]
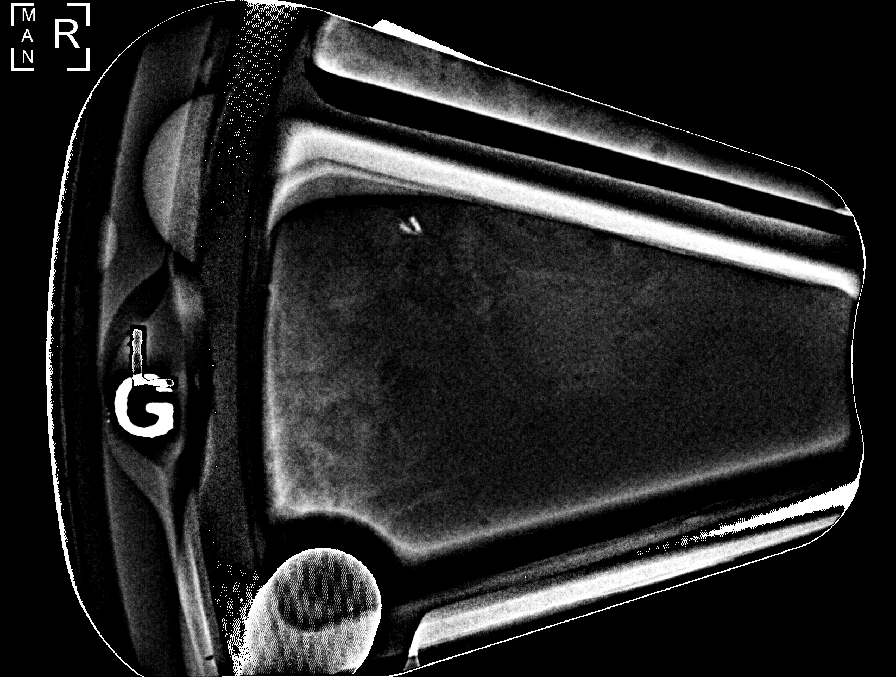

[R ML]
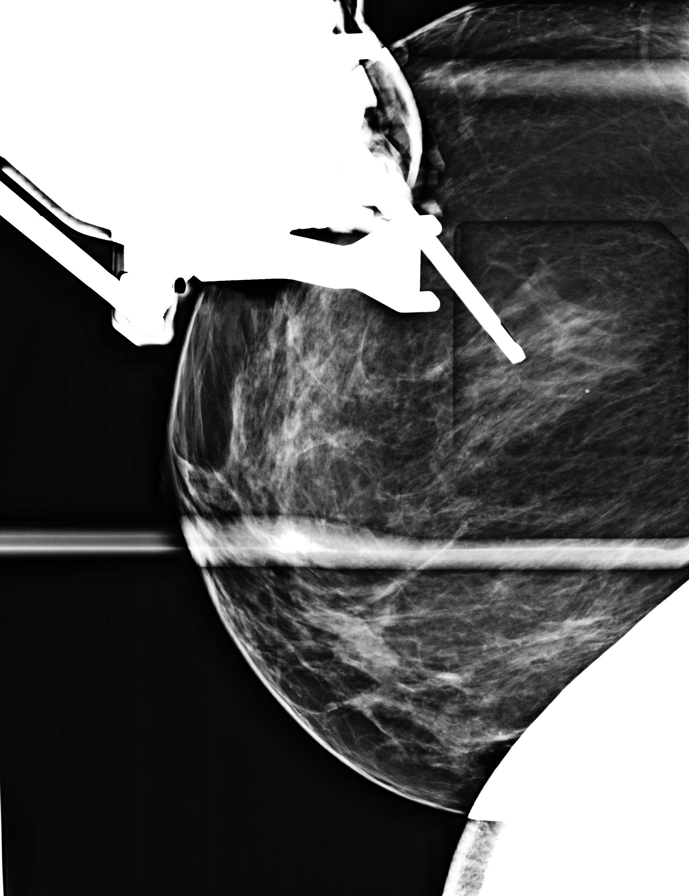

[8 of 33 positions shown; findings below may reference images not displayed]



Using sterile technique and 1% Lidocaine as local anesthetic, under
stereotactic guidance, a 9 gauge vacuum assisted device was used to
perform core needle biopsy of calcifications in the medial right
breast using a medial approach. Specimen radiograph was performed
showing calcifications within a few of the core samples. Specimens
with calcifications are identified for pathology.

Lesion quadrant: Upper-inner quadrant

At the conclusion of the procedure, a coil shaped tissue marker clip
was deployed into the biopsy cavity. Follow-up 2-view mammogram was
performed and dictated separately.
IMPRESSION: Stereotactic-guided biopsy of calcifications in the upper inner
right breast. No apparent complications.

ADDENDUM:
Pathology revealed FIBROADENOMA WITH CALCIFICATIONS, FIBROCYSTIC
CHANGES WITH ADENOSIS AND CALCIFICATIONS of the Right breast,
medial. This was found to be concordant by Dr. Magen Hue.

Pathology results were discussed with the patient by telephone. The
patient reported doing well after the biopsy with tenderness at the
site. Post biopsy instructions and care were reviewed and questions
were answered. The patient was encouraged to call The [REDACTED]

The patient was instructed to return for annual screening
mammography at [REDACTED[facility] in [HOSPITAL][HOSPITAL].

Pathology results reported by Lasvecars Modidima Namogang, RN on 12/24/2018.

*** End of Addendum ***

## 2020-10-02 MED ORDER — AMPHETAMINE-DEXTROAMPHET ER 10 MG PO CP24: 10.0000 mg | ORAL_CAPSULE | Freq: Every day | ORAL | 0 refills | Status: DC

## 2020-10-06 ENCOUNTER — Encounter: Payer: Self-pay | Admitting: Family Medicine

## 2020-10-07 ENCOUNTER — Ambulatory Visit: Payer: 59

## 2020-10-07 ENCOUNTER — Other Ambulatory Visit: Payer: Self-pay | Admitting: Family Medicine

## 2020-10-07 DIAGNOSIS — M549 Dorsalgia, unspecified: Secondary | ICD-10-CM

## 2020-10-12 ENCOUNTER — Other Ambulatory Visit: Payer: Self-pay | Admitting: Family Medicine

## 2020-10-12 DIAGNOSIS — M542 Cervicalgia: Secondary | ICD-10-CM

## 2020-10-18 ENCOUNTER — Ambulatory Visit (INDEPENDENT_AMBULATORY_CARE_PROVIDER_SITE_OTHER): Payer: 59 | Admitting: Bariatrics

## 2020-10-19 ENCOUNTER — Telehealth: Payer: Self-pay | Admitting: Family Medicine

## 2020-10-19 NOTE — Telephone Encounter (Signed)
Pt called, she saw Dr. Aretha Parrot for a second opinion. Plans to discuss with Dr. [name] at next scheduled appt 11/23. Dr. Aretha Parrot advised, in writing, that patient could go back to work on Monday 11/15.  Pt does NOT want to go back to work this Monday. She would like to discuss Dr. Aretha Parrot recommendation (needs a hip replacement) at the 11/23 visit.  Her company will be faxing over RTW paperwork, as Dr. [name] was the initial provider that took her out of work. She would like Dr. [name] to allow her to stay out ( no work in any form - home or at office ) until the recheck on 11/23. I offered to move up this recheck, but patient was not interested.  Paperwork should be faxed over today or tomorrow, this is FYI for completion.

## 2020-10-20 NOTE — Telephone Encounter (Signed)
Thank you for the update.  I did receive the paperwork and have completed it and will fax it back.  Nothing has changed since your visit with Dr. Aretha Parrot on the eighth to allow you to go back to work on the 15th.  Agree with plan to proceed with total hip replacement.

## 2020-10-20 NOTE — Telephone Encounter (Signed)
Paperwork faxed to employer.

## 2020-10-21 ENCOUNTER — Encounter (INDEPENDENT_AMBULATORY_CARE_PROVIDER_SITE_OTHER): Payer: Self-pay | Admitting: Bariatrics

## 2020-10-21 ENCOUNTER — Ambulatory Visit (INDEPENDENT_AMBULATORY_CARE_PROVIDER_SITE_OTHER): Payer: 59 | Admitting: Bariatrics

## 2020-10-21 ENCOUNTER — Other Ambulatory Visit: Payer: Self-pay

## 2020-10-21 VITALS — BP 120/79 | HR 93 | Temp 98.4°F | Ht 64.0 in | Wt 206.0 lb

## 2020-10-21 DIAGNOSIS — E1169 Type 2 diabetes mellitus with other specified complication: Secondary | ICD-10-CM

## 2020-10-21 DIAGNOSIS — E1159 Type 2 diabetes mellitus with other circulatory complications: Secondary | ICD-10-CM

## 2020-10-21 DIAGNOSIS — E669 Obesity, unspecified: Secondary | ICD-10-CM

## 2020-10-21 DIAGNOSIS — I152 Hypertension secondary to endocrine disorders: Secondary | ICD-10-CM | POA: Diagnosis not present

## 2020-10-21 DIAGNOSIS — E119 Type 2 diabetes mellitus without complications: Secondary | ICD-10-CM

## 2020-10-21 DIAGNOSIS — Z6835 Body mass index (BMI) 35.0-35.9, adult: Secondary | ICD-10-CM

## 2020-10-21 NOTE — Progress Notes (Signed)
Chief Complaint:   Lori [name] is here to discuss her progress with her obesity treatment plan along with follow-up of her obesity related diagnoses. Lori [name] is on the Category 3 Plan and states she is following her eating plan approximately 40% of the time. Lori [name] states she is exercising 0 minutes 0 times per week.  Today's visit was #: 5 Starting weight: 216 lbs Starting date: 06/28/2020 Today's weight: 206 lbs Today's date: 10/21/2020 Total lbs lost to date: 10 Total lbs lost since last in-office visit: 0  Interim History: Lori [name] is up 1 lb since her last visit on 08/19/2020. She did get married in the interim. She was not following the plan, but has gone back to her plan this week.  Subjective:   Diabetes mellitus type 2 in obese ([name]). Lori [name] is on no medication.  Lab Results  Component Value Date   HGBA1C 5.8 (H) 10/01/2020   HGBA1C 6.1 (H) 06/28/2020   HGBA1C 7.0 (H) 02/13/2020   Lab Results  Component Value Date   MICROALBUR 10.1 (H) 02/13/2020   LDLCALC 125 (H) 06/28/2020   CREATININE 0.68 06/28/2020   Lab Results  Component Value Date   INSULIN 30.7 (H) 06/28/2020   Hypertension associated with diabetes ([place]). [name] is taking HCTZ and Cozaar. Blood pressure is controlled.  BP Readings from Last 3 Encounters:  10/21/20 120/79  10/01/20 104/78  09/22/20 112/72   Lab Results  Component Value Date   CREATININE 0.68 06/28/2020   CREATININE 0.71 02/13/2020   CREATININE 0.71 02/02/2020   Assessment/Plan:   Diabetes mellitus type 2 in obese ([place]). Good blood sugar control is important to decrease the likelihood of diabetic complications such as nephropathy, neuropathy, limb loss, blindness, coronary artery disease, and death. Intensive lifestyle modification including diet, exercise and weight loss are the first line of treatment for diabetes. Lori [name] will decrease carbohydrates and increase healthy fats and protein.  Hypertension associated with  diabetes ([place]). Lori [name] is working on healthy weight loss and exercise to improve blood pressure control. We will watch for signs of hypotension as she continues her lifestyle modifications. She will continue her medications as directed.   Class 2 severe obesity with serious comorbidity and body mass index (BMI) of 35.0 to 35.9 in adult, unspecified obesity type ([place]).  Lori [name] is currently in the action stage of change. As such, her goal is to continue with weight loss efforts. She has agreed to the Category 3 Plan.   She will adhere more closely to the plan, will work on meal planning, and continue her water consumption.  Exercise goals: Lori [name] is having right hip pain and is seeing an orthopedist.  Behavioral modification strategies: increasing lean protein intake, decreasing simple carbohydrates, increasing vegetables, increasing water intake, decreasing eating out, no skipping meals, meal planning and cooking strategies, keeping healthy foods in the home and planning for success.  Lori [name] has agreed to follow-up with our clinic fasting in 3 weeks. She was informed of the importance of frequent follow-up visits to maximize her success with intensive lifestyle modifications for her multiple health conditions.   Objective:   Blood pressure 120/79, pulse 93, temperature 98.4 F (36.9 C), height 5\' 4"  (1.626 m), weight 206 lb (93.4 kg), last menstrual period 09/27/2016, SpO2 98 %. Body mass index is 35.36 kg/m.  General: Cooperative, alert, well developed, in no acute distress. HEENT: Conjunctivae and lids unremarkable. Cardiovascular: Regular rhythm.  Lungs: Normal work of breathing. Neurologic: No focal deficits.  Lab Results  Component Value Date   CREATININE 0.68 06/28/2020   BUN 12 06/28/2020   NA 142 06/28/2020   K 4.7 06/28/2020   CL 102 06/28/2020   CO2 27 06/28/2020   Lab Results  Component Value Date   ALT 41 (H) 06/28/2020   AST 30 06/28/2020   ALKPHOS 89 06/28/2020    BILITOT 0.3 06/28/2020   Lab Results  Component Value Date   HGBA1C 5.8 (H) 10/01/2020   HGBA1C 6.1 (H) 06/28/2020   HGBA1C 7.0 (H) 02/13/2020   HGBA1C 6.6 (H) 10/27/2019   Lab Results  Component Value Date   INSULIN 30.7 (H) 06/28/2020   Lab Results  Component Value Date   TSH 1.300 06/28/2020   Lab Results  Component Value Date   CHOL 196 06/28/2020   HDL 42 06/28/2020   LDLCALC 125 (H) 06/28/2020   LDLDIRECT 169.0 02/13/2020   TRIG 164 (H) 06/28/2020   CHOLHDL 7 02/13/2020   Lab Results  Component Value Date   WBC 11.0 (H) 02/13/2020   HGB 14.2 02/13/2020   HCT 42.2 02/13/2020   MCV 96.0 02/13/2020   PLT 210.0 02/13/2020   Lab Results  Component Value Date   IRON 200 (H) 12/11/2019   TIBC 296 10/27/2019   FERRITIN 216.0 12/11/2019   Attestation Statements:   Reviewed by clinician on day of visit: allergies, medications, problem list, medical history, surgical history, family history, social history, and previous encounter notes.  Time spent on visit including pre-visit chart review and post-visit charting and care was 20 minutes.   Migdalia Dk, am acting as Location manager for [employer], DO   I have reviewed the above documentation for accuracy and completeness, and I agree with the above. Jearld Lesch, DO

## 2020-10-25 ENCOUNTER — Encounter (INDEPENDENT_AMBULATORY_CARE_PROVIDER_SITE_OTHER): Payer: Self-pay | Admitting: Bariatrics
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
## 2020-11-02 ENCOUNTER — Encounter: Payer: Self-pay | Admitting: Family Medicine

## 2020-11-02 ENCOUNTER — Ambulatory Visit: Payer: 59 | Admitting: Family Medicine

## 2020-11-02 ENCOUNTER — Other Ambulatory Visit: Payer: Self-pay

## 2020-11-02 VITALS — BP 118/80 | HR 86 | Ht 64.0 in | Wt 217.8 lb

## 2020-11-02 DIAGNOSIS — M25551 Pain in right hip: Secondary | ICD-10-CM | POA: Diagnosis not present

## 2020-11-02 NOTE — Patient Instructions (Signed)
Thank you for coming in today.  Plan to have a trial of return to work on 11/15/20.  Plan for future total hip replacement.   Recheck in about 1 month.   Keep me updated.

## 2020-11-16 NOTE — Telephone Encounter (Signed)
Spoke with the pt and informed her of the message below.  CPE scheduled for 12/29/2020.

## 2020-11-22 ENCOUNTER — Ambulatory Visit (INDEPENDENT_AMBULATORY_CARE_PROVIDER_SITE_OTHER): Payer: 59 | Admitting: Bariatrics

## 2020-11-22 ENCOUNTER — Encounter (INDEPENDENT_AMBULATORY_CARE_PROVIDER_SITE_OTHER): Payer: Self-pay | Admitting: Bariatrics

## 2020-11-22 ENCOUNTER — Other Ambulatory Visit: Payer: Self-pay

## 2020-11-22 VITALS — BP 137/90 | HR 87 | Temp 97.7°F | Ht 64.0 in | Wt 210.0 lb

## 2020-11-22 DIAGNOSIS — Z9189 Other specified personal risk factors, not elsewhere classified: Secondary | ICD-10-CM

## 2020-11-22 DIAGNOSIS — E559 Vitamin D deficiency, unspecified: Secondary | ICD-10-CM

## 2020-11-22 DIAGNOSIS — E669 Obesity, unspecified: Secondary | ICD-10-CM

## 2020-11-22 DIAGNOSIS — Z6836 Body mass index (BMI) 36.0-36.9, adult: Secondary | ICD-10-CM

## 2020-11-22 DIAGNOSIS — I10 Essential (primary) hypertension: Secondary | ICD-10-CM

## 2020-11-22 DIAGNOSIS — E1169 Type 2 diabetes mellitus with other specified complication: Secondary | ICD-10-CM | POA: Diagnosis not present

## 2020-11-22 DIAGNOSIS — E785 Hyperlipidemia, unspecified: Secondary | ICD-10-CM

## 2020-11-22 NOTE — Progress Notes (Signed)
Chief Complaint:   Lori [name] is here to discuss her progress with her obesity treatment plan along with follow-up of her obesity related diagnoses. Lori [name] is on the Category 3 Plan and states she is following her eating plan approximately 20% of the time. Lori [name] states she is walking/yard work 2 hours 1 time per week.  Today's visit was #: 6 Starting weight: 216 lbs Starting date: 06/28/2020 Today's weight: 210 lbs Today's date: 11/22/2020 Total lbs lost to date: 6 Total lbs lost since last in-office visit: 0  Interim History: Lori [name] is up 4 lbs since her last visit secondary to increase candy intake.  Subjective:   Diabetes mellitus type 2 in obese (Lori [name]). Lori [name] denies hypoglycemia.   Lab Results  Component Value Date   HGBA1C 5.8 (H) 10/01/2020   HGBA1C 6.1 (H) 06/28/2020   HGBA1C 7.0 (H) 02/13/2020   Lab Results  Component Value Date   MICROALBUR 10.1 (H) 02/13/2020   LDLCALC 125 (H) 06/28/2020   CREATININE 0.68 06/28/2020   Lab Results  Component Value Date   INSULIN 30.7 (H) 06/28/2020   Essential hypertension. Lori [name] is taking Cozaar.  BP Readings from Last 3 Encounters:  11/22/20 137/90  11/02/20 118/80  10/21/20 120/79   Lab Results  Component Value Date   CREATININE 0.68 06/28/2020   CREATININE 0.71 02/13/2020   CREATININE 0.71 02/02/2020   Hyperlipidemia associated with type 2 diabetes mellitus ([place]). Lori [name] is on no medication.  Lab Results  Component Value Date   CHOL 196 06/28/2020   HDL 42 06/28/2020   LDLCALC 125 (H) 06/28/2020   LDLDIRECT 169.0 02/13/2020   TRIG 164 (H) 06/28/2020   CHOLHDL 7 02/13/2020   Lab Results  Component Value Date   ALT 41 (H) 06/28/2020   AST 30 06/28/2020   ALKPHOS 89 06/28/2020   BILITOT 0.3 06/28/2020   The 10-year ASCVD risk score Mikey Bussing DC Jr., et al., 2013) is: 17.2%   Values used to calculate the score:     Age: 54 years     Sex: Female     Is Non-Hispanic African American: No      Diabetic: Yes     Tobacco smoker: Yes     Systolic Blood Pressure: 952 mmHg     Is BP treated: Yes     HDL Cholesterol: 42 mg/dL     Total Cholesterol: 196 mg/dL  Vitamin D deficiency. Lori [name] is taking Vitamin D supplementation.    Ref. Range 06/28/2020 14:37  Vitamin D, 25-Hydroxy Latest Ref Range: 30.0 - 100.0 ng/mL 32.7   At risk for hypoglycemia. Lori [name] is at increased risk for hypoglycemia due to type II diabetes and obesity.  Assessment/Plan:   Diabetes mellitus type 2 in obese ([place]). Good blood sugar control is important to decrease the likelihood of diabetic complications such as nephropathy, neuropathy, limb loss, blindness, coronary artery disease, and death. Intensive lifestyle modification including diet, exercise and weight loss are the first line of treatment for diabetes. Crystelle will decrease carbohydrates and increase exercise. Comprehensive metabolic panel will be checked today.  Essential hypertension. Lori [name] is working on healthy weight loss and exercise to improve blood pressure control. We will watch for signs of hypotension as she continues her lifestyle modifications. She will continue her medication as directed.   Hyperlipidemia associated with type 2 diabetes mellitus (Lori [name]). Cardiovascular risk and specific lipid/LDL goals reviewed.  We discussed several lifestyle modifications today and Myalee will continue to work on diet, exercise  and weight loss efforts. Orders and follow up as documented in patient record. Lipid Panel With LDL/HDL Ratio will be checked today.  Counseling Intensive lifestyle modifications are the first line treatment for this issue. . Dietary changes: Increase soluble fiber. Decrease simple carbohydrates. . Exercise changes: Moderate to vigorous-intensity aerobic activity 150 minutes per week if tolerated. . Lipid-lowering medications: see documented in medical record.   Vitamin D deficiency. Low Vitamin D level contributes to fatigue and are  associated with obesity, breast, and colon cancer. VITAMIN D 25 Hydroxy (Vit-D Deficiency, Fractures) level will be checked today.   At risk for hypoglycemia. Lori [name] was given approximately 15 minutes of counseling today regarding prevention of hypoglycemia. She was advised of symptoms of hypoglycemia. Lori [name] was instructed to avoid skipping meals, eat regular protein rich meals and schedule low calorie snacks as needed.   Repetitive spaced learning was employed today to elicit superior memory formation and behavioral change.   Class 2 severe obesity with serious comorbidity and body mass index (BMI) of 36.0 to 36.9 in adult, unspecified obesity type (Lori [name]).  Lori [name] is currently in the action stage of change. As such, her goal is to continue with weight loss efforts. She has agreed to the Category 3 Plan.   She will work on meal planning, intentional eating, and decreasing her candy intake.  Exercise goals: Lori [name] will increase her activity.  Behavioral modification strategies: increasing lean protein intake, decreasing simple carbohydrates, increasing vegetables, increasing water intake, decreasing eating out, no skipping meals, meal planning and cooking strategies, keeping healthy foods in the home and planning for success.  Lori [name] has agreed to follow-up with our clinic in 2-3 weeks. She was informed of the importance of frequent follow-up visits to maximize her success with intensive lifestyle modifications for her multiple health conditions.   Lori [name] was informed we would discuss her lab results at her next visit unless there is a critical issue that needs to be addressed sooner. Lori [name] agreed to keep her next visit at the agreed upon time to discuss these results.  Objective:   Blood pressure 137/90, pulse 87, temperature 97.7 F (36.5 C), height 5\' 4"  (1.626 m), weight 210 lb (95.3 kg), last menstrual period 09/27/2016, SpO2 98 %. Body mass index is 36.05 kg/m.  General: Cooperative, alert,  well developed, in no acute distress. HEENT: Conjunctivae and lids unremarkable. Cardiovascular: Regular rhythm.  Lungs: Normal work of breathing. Neurologic: No focal deficits.   Lab Results  Component Value Date   CREATININE 0.68 06/28/2020   BUN 12 06/28/2020   NA 142 06/28/2020   K 4.7 06/28/2020   CL 102 06/28/2020   CO2 27 06/28/2020   Lab Results  Component Value Date   ALT 41 (H) 06/28/2020   AST 30 06/28/2020   ALKPHOS 89 06/28/2020   BILITOT 0.3 06/28/2020   Lab Results  Component Value Date   HGBA1C 5.8 (H) 10/01/2020   HGBA1C 6.1 (H) 06/28/2020   HGBA1C 7.0 (H) 02/13/2020   HGBA1C 6.6 (H) 10/27/2019   Lab Results  Component Value Date   INSULIN 30.7 (H) 06/28/2020   Lab Results  Component Value Date   TSH 1.300 06/28/2020   Lab Results  Component Value Date   CHOL 196 06/28/2020   HDL 42 06/28/2020   LDLCALC 125 (H) 06/28/2020   LDLDIRECT 169.0 02/13/2020   TRIG 164 (H) 06/28/2020   CHOLHDL 7 02/13/2020   Lab Results  Component Value Date   WBC 11.0 (H) 02/13/2020  HGB 14.2 02/13/2020   HCT 42.2 02/13/2020   MCV 96.0 02/13/2020   PLT 210.0 02/13/2020   Lab Results  Component Value Date   IRON 200 (H) 12/11/2019   TIBC 296 10/27/2019   FERRITIN 216.0 12/11/2019   Attestation Statements:   Reviewed by clinician on day of visit: allergies, medications, problem list, medical history, surgical history, family history, social history, and previous encounter notes.  Migdalia Dk, am acting as Location manager for [employer], DO   I have reviewed the above documentation for accuracy and completeness, and I agree with the above. Jearld Lesch, DO

## 2020-11-23 ENCOUNTER — Other Ambulatory Visit (INDEPENDENT_AMBULATORY_CARE_PROVIDER_SITE_OTHER): Payer: Self-pay | Admitting: Bariatrics

## 2020-11-23 ENCOUNTER — Other Ambulatory Visit: Payer: Self-pay | Admitting: Family Medicine

## 2020-11-23 DIAGNOSIS — I10 Essential (primary) hypertension: Secondary | ICD-10-CM

## 2020-11-23 DIAGNOSIS — E559 Vitamin D deficiency, unspecified: Secondary | ICD-10-CM

## 2020-11-23 LAB — COMPREHENSIVE METABOLIC PANEL
ALT: 26 IU/L (ref 0–32)
AST: 21 IU/L (ref 0–40)
Albumin/Globulin Ratio: 1.7 (ref 1.2–2.2)
Albumin: 4.3 g/dL (ref 3.8–4.9)
Alkaline Phosphatase: 87 IU/L (ref 44–121)
BUN/Creatinine Ratio: 19 (ref 9–23)
BUN: 13 mg/dL (ref 6–24)
Bilirubin Total: 0.3 mg/dL (ref 0.0–1.2)
CO2: 25 mmol/L (ref 20–29)
Calcium: 9.2 mg/dL (ref 8.7–10.2)
Chloride: 100 mmol/L (ref 96–106)
Creatinine, Ser: 0.7 mg/dL (ref 0.57–1.00)
GFR calc Af Amer: 113 mL/min/{1.73_m2} (ref >59)
GFR calc non Af Amer: 98 mL/min/{1.73_m2} (ref >59)
Globulin, Total: 2.5 g/dL (ref 1.5–4.5)
Glucose: 118 mg/dL — ABNORMAL HIGH (ref 65–99)
Potassium: 4.5 mmol/L (ref 3.5–5.2)
Sodium: 140 mmol/L (ref 134–144)
Total Protein: 6.8 g/dL (ref 6.0–8.5)

## 2020-11-23 LAB — INSULIN, RANDOM: INSULIN: 42.9 u[IU]/mL — ABNORMAL HIGH (ref 2.6–24.9)

## 2020-11-23 LAB — LIPID PANEL WITH LDL/HDL RATIO
Cholesterol, Total: 196 mg/dL (ref 100–199)
HDL: 46 mg/dL (ref >39)
LDL Chol Calc (NIH): 123 mg/dL — ABNORMAL HIGH (ref 0–99)
LDL/HDL Ratio: 2.7 ratio (ref 0.0–3.2)
Triglycerides: 153 mg/dL — ABNORMAL HIGH (ref 0–149)
VLDL Cholesterol Cal: 27 mg/dL (ref 5–40)

## 2020-11-23 LAB — VITAMIN D 25 HYDROXY (VIT D DEFICIENCY, FRACTURES): Vit D, 25-Hydroxy: 36.3 ng/mL (ref 30.0–100.0)

## 2020-11-23 LAB — HEMOGLOBIN A1C
Est. average glucose Bld gHb Est-mCnc: 131 mg/dL
Hgb A1c MFr Bld: 6.2 % — ABNORMAL HIGH (ref 4.8–5.6)

## 2020-11-23 NOTE — Telephone Encounter (Signed)
Dr.Brown 

## 2020-12-16 ENCOUNTER — Other Ambulatory Visit: Payer: Self-pay

## 2020-12-16 ENCOUNTER — Encounter (INDEPENDENT_AMBULATORY_CARE_PROVIDER_SITE_OTHER): Payer: Self-pay | Admitting: Bariatrics

## 2020-12-16 ENCOUNTER — Ambulatory Visit (INDEPENDENT_AMBULATORY_CARE_PROVIDER_SITE_OTHER): Payer: 59 | Admitting: Bariatrics

## 2020-12-16 VITALS — BP 140/74 | HR 89 | Temp 98.0°F | Ht 64.0 in | Wt 213.0 lb

## 2020-12-16 DIAGNOSIS — E1169 Type 2 diabetes mellitus with other specified complication: Secondary | ICD-10-CM

## 2020-12-16 DIAGNOSIS — E559 Vitamin D deficiency, unspecified: Secondary | ICD-10-CM

## 2020-12-16 DIAGNOSIS — E785 Hyperlipidemia, unspecified: Secondary | ICD-10-CM

## 2020-12-16 DIAGNOSIS — E669 Obesity, unspecified: Secondary | ICD-10-CM

## 2020-12-16 DIAGNOSIS — Z9189 Other specified personal risk factors, not elsewhere classified: Secondary | ICD-10-CM

## 2020-12-16 DIAGNOSIS — G4730 Sleep apnea, unspecified: Secondary | ICD-10-CM | POA: Diagnosis not present

## 2020-12-16 DIAGNOSIS — K5909 Other constipation: Secondary | ICD-10-CM | POA: Diagnosis not present

## 2020-12-16 DIAGNOSIS — Z6836 Body mass index (BMI) 36.0-36.9, adult: Secondary | ICD-10-CM

## 2020-12-16 MED ORDER — VITAMIN D (ERGOCALCIFEROL) 1.25 MG (50000 UNIT) PO CAPS: 50000.0000 [IU] | ORAL_CAPSULE | ORAL | 0 refills | Status: DC

## 2020-12-21 NOTE — Progress Notes (Unsigned)
Chief Complaint:   OBESITY Lori [name] is here to discuss her progress with her obesity treatment plan along with follow-up of her obesity related diagnoses. Lori [name] is on the Category 3 Plan and states she is following her eating plan approximately 50% of the time. Lori [name] states she is not exercising at this time.  Today's visit was #: 7 Starting weight: 216 lbs Starting date: 06/28/2020 Today's weight: 213 lbs Today's date: 12/16/2020 Total lbs lost to date: 3 lbs Total lbs lost since last in-office visit: 0  Interim History: Lori [name] is up 3 pounds since her last visit.  Subjective:   1. Diabetes mellitus type 2 in obese ([facility]) Controlled.  A1c is 6.2.  Insulin level 42.7.  Lab Results  Component Value Date   HGBA1C 6.2 (H) 11/22/2020   HGBA1C 5.8 (H) 10/01/2020   HGBA1C 6.1 (H) 06/28/2020   Lab Results  Component Value Date   MICROALBUR 10.1 (H) 02/13/2020   LDLCALC 123 (H) 11/22/2020   CREATININE 0.70 11/22/2020   Lab Results  Component Value Date   INSULIN 42.9 (H) 11/22/2020   INSULIN 30.7 (H) 06/28/2020   2. Hyperlipidemia associated with type 2 diabetes mellitus (Lori [name]) Improving.  Lori [name] has hyperlipidemia and has been trying to improve her cholesterol levels with intensive lifestyle modification including a low saturated fat diet, exercise and weight loss. She denies any chest pain, claudication or myalgias.  She is taking Pravachol.    Lab Results  Component Value Date   ALT 26 11/22/2020   AST 21 11/22/2020   ALKPHOS 87 11/22/2020   BILITOT 0.3 11/22/2020   Lab Results  Component Value Date   CHOL 196 11/22/2020   HDL 46 11/22/2020   LDLCALC 123 (H) 11/22/2020   LDLDIRECT 169.0 02/13/2020   TRIG 153 (H) 11/22/2020   CHOLHDL 7 02/13/2020   3. Sleep apnea with use of continuous positive airway pressure (CPAP) Lori [name] has a diagnosis of sleep apnea. She reports that she is using a CPAP regularly.   4. Other constipation Lori [name] says she has a BM every 2-3  days.  5. Vitamin D deficiency Lori [name]'s Vitamin D level was 36.3 on 11/22/2020. She is currently taking prescription vitamin D 50,000 IU each week. She denies nausea, vomiting or muscle weakness.  6. At risk for heart disease Lori [name] is at a higher than average risk for cardiovascular disease due to obesity and hyperlipidemia.   Assessment/Plan:   1. Diabetes mellitus type 2 in obese ([facility]) Good blood sugar control is important to decrease the likelihood of diabetic complications such as nephropathy, neuropathy, limb loss, blindness, coronary artery disease, and death. Intensive lifestyle modification including diet, exercise and weight loss are the first line of treatment for diabetes.   2. Hyperlipidemia associated with type 2 diabetes mellitus (Lori [name]) Cardiovascular risk and specific lipid/LDL goals reviewed.  We discussed several lifestyle modifications today and Lori [name] will continue to work on diet, exercise and weight loss efforts. Orders and follow up as documented in patient record.  She will eliminate trans fats and decrease saturated fats in her diet.  She will also increase MUFAs and PUFAs.  Counseling Intensive lifestyle modifications are the first line treatment for this issue. . Dietary changes: Increase soluble fiber. Decrease simple carbohydrates. . Exercise changes: Moderate to vigorous-intensity aerobic activity 150 minutes per week if tolerated. . Lipid-lowering medications: see documented in medical record.  3. Sleep apnea with use of continuous positive airway pressure (CPAP) Intensive lifestyle modifications are the first line  treatment for this issue. We discussed several lifestyle modifications today and she will continue to work on diet, exercise and weight loss efforts. We will continue to monitor. She will continue using her CPAP machine.  4. Other constipation Intensive lifestyle modifications are the first line treatment for this issue. We discussed several lifestyle  modifications today and she will continue to work on diet, exercise and weight loss efforts. We will continue to monitor.  She will try Citrucel, then backup Dulcolax or Senna.  5. Vitamin D deficiency Low Vitamin D level contributes to fatigue and are associated with obesity, breast, and colon cancer. She agrees to continue to take prescription Vitamin D @50 ,000 IU every week and will follow-up for routine testing of Vitamin D, at least 2-3 times per year to avoid over-replacement.  A refill for her prescription vitamin D has been sent to the pharmacy today.  6. At risk for heart disease Lori [name] was given approximately 15 minutes of coronary artery disease prevention counseling today. She is 55 y.o. female and has risk factors for heart disease including obesity. We discussed intensive lifestyle modifications today with an emphasis on specific weight loss instructions and strategies.   Repetitive spaced learning was employed today to elicit superior memory formation and behavioral change.  7. Class 2 severe obesity with serious comorbidity and body mass index (BMI) of 36.0 to 36.9 in adult, unspecified obesity type ([facility])  Lori [name] is currently in the action stage of change. As such, her goal is to continue with weight loss efforts. She has agreed to the Category 3 Plan.   She will work on meal planning, intentional eating, will adhere to the plan and will increase her water intake.  Labs from 12/13/021 were discussed with the patient, and include CMP, lipid panel, vitamin D, A1c, and insulin level.  Exercise goals: For substantial health benefits, adults should do at least 150 minutes (2 hours and 30 minutes) a week of moderate-intensity, or 75 minutes (1 hour and 15 minutes) a week of vigorous-intensity aerobic physical activity, or an equivalent combination of moderate- and vigorous-intensity aerobic activity. Aerobic activity should be performed in episodes of at least 10 minutes, and preferably, it  should be spread throughout the week.  Behavioral modification strategies: increasing lean protein intake, decreasing simple carbohydrates, increasing vegetables, increasing water intake, decreasing eating out, no skipping meals, meal planning and cooking strategies, keeping healthy foods in the home and planning for success.  Dejon has agreed to follow-up with our clinic in 2-3 weeks. She was informed of the importance of frequent follow-up visits to maximize her success with intensive lifestyle modifications for her multiple health conditions.   Objective:   Blood pressure 140/74, pulse 89, temperature 98 F (36.7 C), temperature source Oral, height 5\' 4"  (1.626 m), weight 213 lb (96.6 kg), last menstrual period 09/27/2016, SpO2 97 %. Body mass index is 36.56 kg/m.  General: Cooperative, alert, well developed, in no acute distress. HEENT: Conjunctivae and lids unremarkable. Cardiovascular: Regular rhythm.  Lungs: Normal work of breathing. Neurologic: No focal deficits.   Lab Results  Component Value Date   CREATININE 0.70 11/22/2020   BUN 13 11/22/2020   NA 140 11/22/2020   K 4.5 11/22/2020   CL 100 11/22/2020   CO2 25 11/22/2020   Lab Results  Component Value Date   ALT 26 11/22/2020   AST 21 11/22/2020   ALKPHOS 87 11/22/2020   BILITOT 0.3 11/22/2020   Lab Results  Component Value Date   HGBA1C  6.2 (H) 11/22/2020   HGBA1C 5.8 (H) 10/01/2020   HGBA1C 6.1 (H) 06/28/2020   HGBA1C 7.0 (H) 02/13/2020   HGBA1C 6.6 (H) 10/27/2019   Lab Results  Component Value Date   INSULIN 42.9 (H) 11/22/2020   INSULIN 30.7 (H) 06/28/2020   Lab Results  Component Value Date   TSH 1.300 06/28/2020   Lab Results  Component Value Date   CHOL 196 11/22/2020   HDL 46 11/22/2020   LDLCALC 123 (H) 11/22/2020   LDLDIRECT 169.0 02/13/2020   TRIG 153 (H) 11/22/2020   CHOLHDL 7 02/13/2020   Lab Results  Component Value Date   WBC 11.0 (H) 02/13/2020   HGB 14.2 02/13/2020   HCT  42.2 02/13/2020   MCV 96.0 02/13/2020   PLT 210.0 02/13/2020   Lab Results  Component Value Date   IRON 200 (H) 12/11/2019   TIBC 296 10/27/2019   FERRITIN 216.0 12/11/2019   Attestation Statements:   Reviewed by clinician on day of visit: allergies, medications, problem list, medical history, surgical history, family history, social history, and previous encounter notes.  I, Water quality scientist, CMA, am acting as Location manager for [employer], DO  I have reviewed the above documentation for accuracy and completeness, and I agree with the above. Jearld Lesch, DO

## 2020-12-22 ENCOUNTER — Encounter (INDEPENDENT_AMBULATORY_CARE_PROVIDER_SITE_OTHER): Payer: Self-pay | Admitting: Bariatrics

## 2020-12-29 ENCOUNTER — Ambulatory Visit: Payer: 59 | Admitting: Family Medicine

## 2021-01-06 ENCOUNTER — Ambulatory Visit (INDEPENDENT_AMBULATORY_CARE_PROVIDER_SITE_OTHER): Payer: 59 | Admitting: Bariatrics

## 2021-01-06 ENCOUNTER — Encounter (INDEPENDENT_AMBULATORY_CARE_PROVIDER_SITE_OTHER): Payer: Self-pay | Admitting: Bariatrics

## 2021-01-06 ENCOUNTER — Other Ambulatory Visit: Payer: Self-pay

## 2021-01-06 VITALS — BP 98/65 | Temp 97.9°F | Ht 64.0 in | Wt 217.0 lb

## 2021-01-06 DIAGNOSIS — Z9189 Other specified personal risk factors, not elsewhere classified: Secondary | ICD-10-CM

## 2021-01-06 DIAGNOSIS — Z6837 Body mass index (BMI) 37.0-37.9, adult: Secondary | ICD-10-CM | POA: Diagnosis not present

## 2021-01-06 DIAGNOSIS — E7849 Other hyperlipidemia: Secondary | ICD-10-CM | POA: Diagnosis not present

## 2021-01-06 DIAGNOSIS — E66812 Obesity, class 2: Secondary | ICD-10-CM

## 2021-01-06 DIAGNOSIS — E559 Vitamin D deficiency, unspecified: Secondary | ICD-10-CM

## 2021-01-06 MED ORDER — VITAMIN D (ERGOCALCIFEROL) 1.25 MG (50000 UNIT) PO CAPS: 50000.0000 [IU] | ORAL_CAPSULE | ORAL | 0 refills | Status: DC

## 2021-01-06 NOTE — Progress Notes (Signed)
Chief Complaint:   OBESITY Lori [name] is here to discuss her progress with her obesity treatment plan along with follow-up of her obesity related diagnoses. Lori [name] is on the Category 3 Plan and states she is following her eating plan approximately 10-20% of the time. Lori [name] states she is walking at work.  Today's visit was #: 8 Starting weight: 216 lbs Starting date: 06/28/2020 Today's weight: 217 lbs Today's date: 01/06/2021 Total lbs lost to date: 0 Total lbs lost since last in-office visit: 0  Interim History: Lori [name] is up 4 pounds.  She is drinking more protein shakes.  Subjective:   1. Vitamin D deficiency Lori [name]'s Vitamin D level was 36.3 on 11/22/2020. She is currently taking prescription vitamin D 50,000 IU each week. She denies nausea, vomiting or muscle weakness.  She gets minimal sunlight exposure.  2. Other hyperlipidemia Lori [name] has hyperlipidemia and has been trying to improve her cholesterol levels with intensive lifestyle modification including a low saturated fat diet, exercise and weight loss. She denies any chest pain, claudication or myalgias.  She is taking Pravachol.   Lab Results  Component Value Date   ALT 26 11/22/2020   AST 21 11/22/2020   ALKPHOS 87 11/22/2020   BILITOT 0.3 11/22/2020   Lab Results  Component Value Date   CHOL 196 11/22/2020   HDL 46 11/22/2020   LDLCALC 123 (H) 11/22/2020   LDLDIRECT 169.0 02/13/2020   TRIG 153 (H) 11/22/2020   CHOLHDL 7 02/13/2020   3. At risk for osteoporosis Lori [name] is at higher risk of osteopenia and osteoporosis due to Vitamin D deficiency.   Assessment/Plan:   1. Vitamin D deficiency Low Vitamin D level contributes to fatigue and are associated with obesity, breast, and colon cancer. She agrees to continue to take prescription Vitamin D @50 ,000 IU every week and will follow-up for routine testing of Vitamin D, at least 2-3 times per year to avoid over-replacement.  -Refill Vitamin D, Ergocalciferol, (DRISDOL)  1.25 MG (50000 UNIT) CAPS capsule; Take 1 capsule (50,000 Units total) by mouth every 7 (seven) days.  Dispense: 4 capsule; Refill: 0  2. Other hyperlipidemia Cardiovascular risk and specific lipid/LDL goals reviewed.  We discussed several lifestyle modifications today and Lori [name] will continue to work on diet, exercise and weight loss efforts. Orders and follow up as documented in patient record.  Continue Pravachol.   Counseling Intensive lifestyle modifications are the first line treatment for this issue. . Dietary changes: Increase soluble fiber. Decrease simple carbohydrates. . Exercise changes: Moderate to vigorous-intensity aerobic activity 150 minutes per week if tolerated. . Lipid-lowering medications: see documented in medical record.  3. At risk for osteoporosis Lori [name] was given approximately 15 minutes of osteoporosis prevention counseling today. Lori [name] is at risk for osteopenia and osteoporosis due to her Vitamin D deficiency. She was encouraged to take her Vitamin D and follow her higher calcium diet and increase strengthening exercise to help strengthen her bones and decrease her risk of osteopenia and osteoporosis.  Repetitive spaced learning was employed today to elicit superior memory formation and behavioral change.  4. Class 2 severe obesity with serious comorbidity and body mass index (BMI) of 37.0 to 37.9 in adult, unspecified obesity type ([facility])  Lori [name] is currently in the action stage of change. As such, her goal is to continue with weight loss efforts. She has agreed to the Category 3 Plan.   She will work on being at least 80% adherent to the plan and increasing the fiber in  her diet (cruciferous vegetables).  Recipes II handout was given today.  Exercise goals: Decrease exercise due to more hip pain.  Behavioral modification strategies: increasing lean protein intake, decreasing simple carbohydrates, increasing vegetables, increasing water intake, decreasing eating out,  no skipping meals, meal planning and cooking strategies, keeping healthy foods in the home and planning for success.  Lori [name] has agreed to follow-up with our clinic in 2 weeks. She was informed of the importance of frequent follow-up visits to maximize her success with intensive lifestyle modifications for her multiple health conditions.   Objective:   Blood pressure 98/65, temperature 97.9 F (36.6 C), temperature source Oral, height 5\' 4"  (1.626 m), weight 217 lb (98.4 kg), last menstrual period 09/27/2016, SpO2 97 %. Body mass index is 37.25 kg/m.  General: Cooperative, alert, well developed, in no acute distress. HEENT: Conjunctivae and lids unremarkable. Cardiovascular: Regular rhythm.  Lungs: Normal work of breathing. Neurologic: No focal deficits.   Lab Results  Component Value Date   CREATININE 0.70 11/22/2020   BUN 13 11/22/2020   NA 140 11/22/2020   K 4.5 11/22/2020   CL 100 11/22/2020   CO2 25 11/22/2020   Lab Results  Component Value Date   ALT 26 11/22/2020   AST 21 11/22/2020   ALKPHOS 87 11/22/2020   BILITOT 0.3 11/22/2020   Lab Results  Component Value Date   HGBA1C 6.2 (H) 11/22/2020   HGBA1C 5.8 (H) 10/01/2020   HGBA1C 6.1 (H) 06/28/2020   HGBA1C 7.0 (H) 02/13/2020   HGBA1C 6.6 (H) 10/27/2019   Lab Results  Component Value Date   INSULIN 42.9 (H) 11/22/2020   INSULIN 30.7 (H) 06/28/2020   Lab Results  Component Value Date   TSH 1.300 06/28/2020   Lab Results  Component Value Date   CHOL 196 11/22/2020   HDL 46 11/22/2020   LDLCALC 123 (H) 11/22/2020   LDLDIRECT 169.0 02/13/2020   TRIG 153 (H) 11/22/2020   CHOLHDL 7 02/13/2020   Lab Results  Component Value Date   WBC 11.0 (H) 02/13/2020   HGB 14.2 02/13/2020   HCT 42.2 02/13/2020   MCV 96.0 02/13/2020   PLT 210.0 02/13/2020   Lab Results  Component Value Date   IRON 200 (H) 12/11/2019   TIBC 296 10/27/2019   FERRITIN 216.0 12/11/2019   Attestation Statements:   Reviewed by  clinician on day of visit: allergies, medications, problem list, medical history, surgical history, family history, social history, and previous encounter notes.  I, Water quality scientist, CMA, am acting as Location manager for [employer], DO  I have reviewed the above documentation for accuracy and completeness, and I agree with the above. Jearld Lesch, DO

## 2021-01-07 ENCOUNTER — Other Ambulatory Visit: Payer: Self-pay | Admitting: Family Medicine

## 2021-01-07 DIAGNOSIS — M542 Cervicalgia: Secondary | ICD-10-CM

## 2021-01-11 ENCOUNTER — Encounter: Payer: Self-pay | Admitting: Family Medicine

## 2021-01-20 ENCOUNTER — Encounter: Payer: Self-pay | Admitting: Family Medicine

## 2021-01-20 ENCOUNTER — Telehealth: Payer: 59 | Admitting: Family Medicine

## 2021-01-20 DIAGNOSIS — Z538 Procedure and treatment not carried out for other reasons: Secondary | ICD-10-CM

## 2021-01-20 NOTE — Telephone Encounter (Signed)
Spoke with the patient and scheduled a virtual visit with Dr Maudie Mercury as PCP not available.

## 2021-01-20 NOTE — Progress Notes (Signed)
On check in questions with patient, realized [place] today. Apologized and let patient know that we cannot complete out of state visits due to telemedicine restrictions. Provided other options for care including national telemedicine options or [facility] local to her location. She was appreciative. Sent message to PCP office to cancel without no show or late charge.

## 2021-01-24 ENCOUNTER — Ambulatory Visit (INDEPENDENT_AMBULATORY_CARE_PROVIDER_SITE_OTHER): Payer: 59 | Admitting: Bariatrics

## 2021-02-04 NOTE — Progress Notes (Signed)
I, Lori [name], LAT, ATC, am serving as scribe for Dr. [name].  Lori [name] is a 55 y.o. female who presents to [facility] today for f/u of R groin/hip pain.  She was last seen by Dr. Georgina [name] on 11/02/20 for f/u after having seen Dr. Aretha [name] for another opinion on 10/18/20.  She RTW on 11/15/20 per her discussion w/ Dr. Georgina [name] at her last visit.  Since her last visit, pt reports continuing to have R groin pain that is waking her in the middle of the night.  She is taking a combination of Tylenol and Oxycodone to manage her pain. She reports newer pain in her R inferior/post groin.  She states that if she sits for too long, this aggravates her symptoms and notes that she can "barely walk" once she gets up.  She states that if she stands too long especially while getting ready in the morning, this causes low back pain that radiates into her buttocks.  She also notes experiencing L hip pain too likely due to compensatory movement patterns secondary to her chronic R hip/groin pain.  Diagnostic testing: L-spine MRI- 07/21/20; CT pelvis- 05/27/20; R hip MRI- 04/19/20; R hip XR- 03/04/20   Pertinent review of systems: no fever or chills  Relevant historical information: HTN, OSA, DM. Hx prior hip pain   Exam:  BP 120/84 (BP Location: Right Arm, Patient Position: Sitting, Cuff Size: Normal)   Pulse 69   Ht 5\' 4"  (1.626 m)   Wt 217 lb 6.4 oz (98.6 kg)   LMP 09/27/2016 (Approximate)   SpO2 98%   BMI 37.32 kg/m  General: Well Developed, well nourished, and in no acute distress.   MSK: Right hip. Normal appearing.  Tender palpation at tendon insertion and hip abductor between ischial tuberosity and pubic symphysis.  Normal hip motion. Some pain with hip flexion. Groin pain somewhat reproducible with resisted hip adduction and resisted knee flexion. Hip strength is intact however.      Lab and Radiology Results  X-ray right hip obtained today personally and  independently interpreted. No acute fractures visible. Mild DJD right hip. Await formal radiology review  Diagnostic Limited MSK Ultrasound of: Right hip adductor insertion No obvious tear. Slight hypoechoic change near area of tenderness at hip adductor insertion. Slight increased Doppler activity. No large tear visible. Impression: Hip adductor insertion tendinopathy      Assessment and Plan: 55 y.o. female with right groin pain due to hip adductor tendinopathy without large tear. . This is new and different than previous hip pain. Plan for trial of home exercise program. If not improving patient will notify me and I will refer to physical therapy. If not proving could consider trial of injection but I would like to avoid that if possible. Recheck back in near future if not improved.   PDMP not reviewed this encounter. Orders Placed This Encounter  Procedures  . [name] LIMITED JOINT SPACE STRUCTURES LOW RIGHT(NO LINKED CHARGES)    Order Specific Question:   Reason for Exam (SYMPTOM  OR DIAGNOSIS REQUIRED)    Answer:   R hip pain    Order Specific Question:   Preferred imaging location?    Answer:   [place]  . DG HIP UNILAT WITH PELVIS 2-3 VIEWS RIGHT    Standing Status:   Future    Number of Occurrences:   1    Standing Expiration Date:   02/07/2022    Order  Specific Question:   Reason for Exam (SYMPTOM  OR DIAGNOSIS REQUIRED)    Answer:   eval hip pain r    Order Specific Question:   Is patient pregnant?    Answer:   No    Order Specific Question:   Preferred imaging location?    Answer:   [facility]   No orders of the defined types were placed in this encounter.    Discussed warning signs or symptoms. Please see discharge instructions. Patient expresses understanding.   The above documentation has been reviewed and is accurate and complete [name], M.D.

## 2021-02-07 ENCOUNTER — Ambulatory Visit (INDEPENDENT_AMBULATORY_CARE_PROVIDER_SITE_OTHER): Payer: 59

## 2021-02-07 ENCOUNTER — Other Ambulatory Visit: Payer: Self-pay

## 2021-02-07 ENCOUNTER — Ambulatory Visit: Payer: Self-pay

## 2021-02-07 ENCOUNTER — Encounter: Payer: Self-pay | Admitting: Family Medicine

## 2021-02-07 ENCOUNTER — Ambulatory Visit: Payer: 59 | Admitting: Family Medicine

## 2021-02-07 VITALS — BP 120/84 | HR 69 | Ht 64.0 in | Wt 217.4 lb

## 2021-02-07 DIAGNOSIS — M25551 Pain in right hip: Secondary | ICD-10-CM | POA: Diagnosis not present

## 2021-02-07 NOTE — Patient Instructions (Signed)
Good to see you.  Please do the exercises we discussed.  Try these exercises for 2 weeks and if you're not feeling better, let us know and we will order formal physical therapy.  Please get an XR on the way out.

## 2021-02-08 ENCOUNTER — Encounter: Payer: Self-pay | Admitting: Family Medicine

## 2021-02-08 NOTE — Progress Notes (Signed)
X-ray shows evidence of remote fracture at the pubic symphysis.  However this is not where you are hurting currently.Marland Kitchen  However this is not where you are currently hurting.

## 2021-02-15 ENCOUNTER — Other Ambulatory Visit: Payer: Self-pay | Admitting: Family Medicine

## 2021-02-15 DIAGNOSIS — I10 Essential (primary) hypertension: Secondary | ICD-10-CM

## 2021-02-21 ENCOUNTER — Other Ambulatory Visit: Payer: Self-pay | Admitting: Family Medicine

## 2021-02-28 ENCOUNTER — Other Ambulatory Visit (HOSPITAL_COMMUNITY)
Admission: RE | Admit: 2021-02-28 | Discharge: 2021-02-28 | Disposition: A | Discharge status: Home / Self Care | Payer: 59 | Source: Ambulatory Visit | Attending: Family Medicine | Admitting: Family Medicine

## 2021-02-28 ENCOUNTER — Encounter: Payer: Self-pay | Admitting: Family Medicine

## 2021-02-28 ENCOUNTER — Other Ambulatory Visit: Payer: Self-pay

## 2021-02-28 ENCOUNTER — Ambulatory Visit (INDEPENDENT_AMBULATORY_CARE_PROVIDER_SITE_OTHER): Payer: 59 | Admitting: Family Medicine

## 2021-02-28 VITALS — BP 112/74 | HR 76 | Temp 97.6°F | Ht 64.75 in | Wt 214.2 lb

## 2021-02-28 DIAGNOSIS — F17200 Nicotine dependence, unspecified, uncomplicated: Secondary | ICD-10-CM | POA: Diagnosis not present

## 2021-02-28 DIAGNOSIS — E1169 Type 2 diabetes mellitus with other specified complication: Secondary | ICD-10-CM

## 2021-02-28 DIAGNOSIS — E785 Hyperlipidemia, unspecified: Secondary | ICD-10-CM | POA: Diagnosis not present

## 2021-02-28 DIAGNOSIS — I10 Essential (primary) hypertension: Secondary | ICD-10-CM

## 2021-02-28 DIAGNOSIS — Z124 Encounter for screening for malignant neoplasm of cervix: Secondary | ICD-10-CM | POA: Insufficient documentation

## 2021-02-28 DIAGNOSIS — K760 Fatty (change of) liver, not elsewhere classified: Secondary | ICD-10-CM

## 2021-02-28 DIAGNOSIS — J301 Allergic rhinitis due to pollen: Secondary | ICD-10-CM | POA: Diagnosis not present

## 2021-02-28 DIAGNOSIS — K219 Gastro-esophageal reflux disease without esophagitis: Secondary | ICD-10-CM

## 2021-02-28 DIAGNOSIS — M8580 Other specified disorders of bone density and structure, unspecified site: Secondary | ICD-10-CM

## 2021-02-28 DIAGNOSIS — E559 Vitamin D deficiency, unspecified: Secondary | ICD-10-CM | POA: Diagnosis not present

## 2021-02-28 DIAGNOSIS — E1165 Type 2 diabetes mellitus with hyperglycemia: Secondary | ICD-10-CM

## 2021-02-28 DIAGNOSIS — Z23 Encounter for immunization: Secondary | ICD-10-CM | POA: Diagnosis not present

## 2021-02-28 DIAGNOSIS — M810 Age-related osteoporosis without current pathological fracture: Secondary | ICD-10-CM

## 2021-02-28 DIAGNOSIS — G4733 Obstructive sleep apnea (adult) (pediatric): Secondary | ICD-10-CM

## 2021-02-28 DIAGNOSIS — L309 Dermatitis, unspecified: Secondary | ICD-10-CM

## 2021-02-28 DIAGNOSIS — F988 Other specified behavioral and emotional disorders with onset usually occurring in childhood and adolescence: Secondary | ICD-10-CM | POA: Insufficient documentation

## 2021-02-28 DIAGNOSIS — F909 Attention-deficit hyperactivity disorder, unspecified type: Secondary | ICD-10-CM

## 2021-02-28 DIAGNOSIS — M159 Polyosteoarthritis, unspecified: Secondary | ICD-10-CM

## 2021-02-28 DIAGNOSIS — Z0000 Encounter for general adult medical examination without abnormal findings: Secondary | ICD-10-CM | POA: Diagnosis not present

## 2021-02-28 DIAGNOSIS — Z1231 Encounter for screening mammogram for malignant neoplasm of breast: Secondary | ICD-10-CM

## 2021-02-28 DIAGNOSIS — M8949 Other hypertrophic osteoarthropathy, multiple sites: Secondary | ICD-10-CM

## 2021-02-28 LAB — CBC WITH DIFFERENTIAL/PLATELET
Basophils Absolute: 0.1 10*3/uL (ref 0.0–0.1)
Basophils Relative: 0.8 % (ref 0.0–3.0)
Eosinophils Absolute: 0.2 10*3/uL (ref 0.0–0.7)
Eosinophils Relative: 1.7 % (ref 0.0–5.0)
HCT: 42.8 % (ref 36.0–46.0)
Hemoglobin: 14.5 g/dL (ref 12.0–15.0)
Lymphocytes Relative: 42.7 % (ref 12.0–46.0)
Lymphs Abs: 4.6 10*3/uL — ABNORMAL HIGH (ref 0.7–4.0)
MCHC: 33.9 g/dL (ref 30.0–36.0)
MCV: 92.6 fl (ref 78.0–100.0)
Monocytes Absolute: 0.5 10*3/uL (ref 0.1–1.0)
Monocytes Relative: 4.8 % (ref 3.0–12.0)
Neutro Abs: 5.4 10*3/uL (ref 1.4–7.7)
Neutrophils Relative %: 50 % (ref 43.0–77.0)
Platelets: 232 10*3/uL (ref 150.0–400.0)
RBC: 4.63 Mil/uL (ref 3.87–5.11)
RDW: 13.9 % (ref 11.5–15.5)
WBC: 10.7 10*3/uL — ABNORMAL HIGH (ref 4.0–10.5)

## 2021-02-28 LAB — COMPREHENSIVE METABOLIC PANEL
ALT: 36 U/L — ABNORMAL HIGH (ref 0–35)
AST: 32 U/L (ref 0–37)
Albumin: 4.9 g/dL (ref 3.5–5.2)
Alkaline Phosphatase: 72 U/L (ref 39–117)
BUN: 16 mg/dL (ref 6–23)
CO2: 27 mEq/L (ref 19–32)
Calcium: 10.1 mg/dL (ref 8.4–10.5)
Chloride: 100 mEq/L (ref 96–112)
Creatinine, Ser: 0.82 mg/dL (ref 0.40–1.20)
GFR: 80.17 mL/min (ref >60.00)
Glucose, Bld: 101 mg/dL — ABNORMAL HIGH (ref 70–99)
Potassium: 4.3 mEq/L (ref 3.5–5.1)
Sodium: 139 mEq/L (ref 135–145)
Total Bilirubin: 0.4 mg/dL (ref 0.2–1.2)
Total Protein: 7.4 g/dL (ref 6.0–8.3)

## 2021-02-28 LAB — SEDIMENTATION RATE: Sed Rate: 18 mm/hr (ref 0–30)

## 2021-02-28 LAB — TSH: TSH: 2.33 u[IU]/mL (ref 0.35–4.50)

## 2021-02-28 LAB — PROTIME-INR
INR: 1.1 ratio — ABNORMAL HIGH (ref 0.8–1.0)
Prothrombin Time: 12.4 s (ref 9.6–13.1)

## 2021-02-28 LAB — LIPID PANEL
Cholesterol: 205 mg/dL — ABNORMAL HIGH (ref 0–200)
HDL: 43.6 mg/dL (ref >39.00)
LDL Cholesterol: 125 mg/dL — ABNORMAL HIGH (ref 0–99)
NonHDL: 161.61
Total CHOL/HDL Ratio: 5
Triglycerides: 184 mg/dL — ABNORMAL HIGH (ref 0.0–149.0)
VLDL: 36.8 mg/dL (ref 0.0–40.0)

## 2021-02-28 LAB — MICROALBUMIN / CREATININE URINE RATIO
Creatinine,U: 234.5 mg/dL
Microalb Creat Ratio: 2.5 mg/g (ref 0.0–30.0)
Microalb, Ur: 6 mg/dL — ABNORMAL HIGH (ref 0.0–1.9)

## 2021-02-28 LAB — VITAMIN D 25 HYDROXY (VIT D DEFICIENCY, FRACTURES): VITD: 34.58 ng/mL (ref 30.00–100.00)

## 2021-02-28 LAB — HEMOGLOBIN A1C: Hgb A1c MFr Bld: 5.9 % (ref 4.6–6.5)

## 2021-02-28 NOTE — Patient Instructions (Addendum)
Let me know if you cannot find your chantix rx. We can try to resend it.   You can get the covid booster in may - you can go to [email or web address]  Can try vasoline, consider replens

## 2021-02-28 NOTE — Progress Notes (Signed)
Lori [name] DOB: Feb 20, 1966 Encounter date: 02/28/2021  This is a 55 y.o. female who presents for complete physical   History of present illness/Additional concerns: This morning states that hip is killing her. Sometimes has to take 2 of pain pills to get started. She had COVID and had to cancel appointment with specialists - so she missed an appointment with pain specialist. She was sick for a couple of weeks. Dr. [name] sent her to Dr. Erlinda Hong - told her to lose weight and sent her to Dr. Leafy Ro. She has changed some things - like she is doing a protein shake for breakfast. She was down to 209; but gained some back after COVID. Got another opinion from hip specialist at [facility] she could have scope done, but recovery actually was longer than hip replacement.   She states that she is smoking less than 1/2 PPD so doing better but still smoking.  She does plan to quit smoking prior to surgery and is anticipating a hip surgery.  ADD/depression/anxieyt: gets frustrated because she can't get things done like she wants to.   Body dry, mouth dry.   OSA: wears machine; but hard to get comfortable  Rash on feet - getting circular red spots - states that derm told her it wasn't a skin condition.   She is not seeing gynecology - hasn't seen them since 2018 (right after hysterectomy). Due for mammogram. States due for pap smear.   Colonoscopy completed 04/2019  Past Medical History:  Diagnosis Date   ALLERGIC RHINITIS 03/14/2010   BACK PAIN 01/24/2010   Buttock pain    Constipation    Diabetes ([place])    Fatty liver    GERD 12/01/2008   HBP (high blood pressure)    HIATAL HERNIA 05/27/2010   Hot flashes    Hyperlipidemia    Hypertrophy of tongue papillae 03/14/2010   IBS (irritable bowel syndrome)    Joint pain    Numbness and tingling of left upper and lower extremity    OSTEOARTHRITIS 12/01/2008   Osteoporosis 05/17/2020   Fragility fracture plus T score -1.8 on DEXA scan  June 2021   Sciatica, left side    Sleep apnea    Sleep apnea    Thigh pain    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   TOBACCO USE 12/01/2008   VITAMIN D DEFICIENCY 05/23/2010   Past Surgical History:  Procedure Laterality Date   ABDOMINAL HYSTERECTOMY N/A 10/17/2016   Procedure: HYSTERECTOMY ABDOMINAL;  Surgeon: Sanjuana Kava, MD;  Location: [facility]; benign mass in uterus   APPENDECTOMY     BREAST SURGERY     lumpectomy   CERVICAL FUSION     x2; 2007, 2011   [place]eline N/A 10/17/2016   Procedure: CYSTOSCOPY;  Surgeon: Sanjuana Kava, MD;  Location: [facility];  Service: Gynecology;  Laterality: N/A;   DILATION AND CURETTAGE OF UTERUS     miscarriage   ESOPHAGEAL MANOMETRY     ESOPHAGOGASTRODUODENOSCOPY     GASTRIC FUNDOPLICATION     [place]   LAPAROSCOPIC NISSEN FUNDOPLICATION     LUMBAR DISC SURGERY     LUMBAR LAMINECTOMY  2005   SALPINGOOPHORECTOMY Bilateral 10/17/2016   Procedure: SALPINGO OOPHORECTOMY;  Surgeon: Sanjuana Kava, MD;  Location: [facility];  Service: Gynecology;  Laterality: Bilateral;   TONSILLECTOMY AND ADENOIDECTOMY     UPPER GASTROINTESTINAL ENDOSCOPY     Allergies  Allergen Reactions   Amoxicillin Nausea Only  Yeast Infection Has patient had a PCN reaction causing immediate rash, facial/tongue/throat swelling, SOB or lightheadedness with hypotension: no Has patient had a PCN reaction causing severe rash involving mucus membranes or skin necrosis: no Has patient had a PCN reaction that required hospitalization yes Has patient had a PCN reaction occurring within the last 10 years: yes If all of the above answers are "NO", then may proceed with Cephalosporin use.  Yeast Infection Has patient had a PCN reaction causing immediate rash, facial/tongue/throat swelling, SOB or lightheadedness with hypotension: no Has patient had a PCN reaction causing severe rash involving mucus membranes or skin necrosis: no Has patient had  a PCN reaction that required hospitalization yes Has patient had a PCN reaction occurring within the last 10 years: yes If all of the above answers are "NO", then may proceed with Cephalosporin use.   Eggs Or Egg-Derived Products    Morphine Other (See Comments)    REACTION: hyper REACTION: hyper   Thiethylperazine Other (See Comments)   Latex Rash   Current Meds  Medication Sig   rosuvastatin (CRESTOR) 10 MG tablet Take 1 tablet (10 mg total) by mouth daily.   Current Facility-Administered Medications for the 02/28/21 encounter (Office Visit) with Caren Macadam, MD  Medication   0.9 %  sodium chloride infusion   Social History   Tobacco Use   Smoking status: Current Every Day Smoker    Packs/day: 1.00    Years: 36.00    Pack years: 36.00    Types: Cigarettes   Smokeless tobacco: Never Used   Tobacco comment: now smoking 0.5 packs a day  Substance Use Topics   Alcohol use: Yes    Comment: social   Family History  Problem Relation Age of Onset   Breast cancer Mother 65   High blood pressure Mother    High Cholesterol Mother    Diabetes Mother    Diverticulitis Mother 60   Heart attack Mother 45       during hospitalization with diverticulitis   Heart disease Mother    Obesity Mother    Alzheimer's disease Father    Alcohol abuse Father    Sleep apnea Father    Colon polyps Half-Sister    Colonic polyp Half-Brother    Esophageal cancer Neg Hx    Colon cancer Neg Hx    Rectal cancer Neg Hx    Stomach cancer Neg Hx      Review of Systems  Constitutional: Negative for activity change, appetite change, chills, fatigue, fever and unexpected weight change.  HENT: Negative for congestion, ear pain, hearing loss, sinus pressure, sinus pain, sore throat and trouble swallowing.   Eyes: Negative for pain and visual disturbance.  Respiratory: Negative for cough, chest tightness, shortness of breath and wheezing.   Cardiovascular: Negative  for chest pain, palpitations and leg swelling.  Gastrointestinal: Positive for constipation. Negative for abdominal pain, blood in stool, diarrhea, nausea and vomiting.  Genitourinary: Negative for difficulty urinating and menstrual problem.  Musculoskeletal: Positive for arthralgias, back pain and gait problem.  Skin: Negative for rash.  Neurological: Negative for dizziness, weakness, numbness and headaches.  Hematological: Negative for adenopathy. Does not bruise/bleed easily.  Psychiatric/Behavioral: Negative for sleep disturbance and suicidal ideas. The patient is not nervous/anxious.     CBC:  Lab Results  Component Value Date   WBC 10.7 (H) 02/28/2021   HGB 14.5 02/28/2021   HGB 15.2 (H) 10/27/2019   HCT 42.8 02/28/2021   MCH 32.1 02/02/2020  MCHC 33.9 02/28/2021   RDW 13.9 02/28/2021   PLT 232.0 02/28/2021   PLT 235 10/27/2019   CMP: Lab Results  Component Value Date   NA 139 02/28/2021   NA 140 11/22/2020   K 4.3 02/28/2021   CL 100 02/28/2021   CO2 27 02/28/2021   ANIONGAP 13 02/02/2020   GLUCOSE 101 (H) 02/28/2021   BUN 16 02/28/2021   BUN 13 11/22/2020   CREATININE 0.82 02/28/2021   CREATININE 0.82 10/27/2019   LABGLOB 2.5 11/22/2020   GFRAA 113 11/22/2020   GFRAA >60 10/27/2019   CALCIUM 10.1 02/28/2021   PROT 7.4 02/28/2021   PROT 6.8 11/22/2020   AGRATIO 1.7 11/22/2020   BILITOT 0.4 02/28/2021   BILITOT 0.3 11/22/2020   BILITOT 0.3 10/27/2019   ALKPHOS 72 02/28/2021   ALT 36 (H) 02/28/2021   ALT 59 (H) 10/27/2019   AST 32 02/28/2021   AST 39 10/27/2019   LIPID: Lab Results  Component Value Date   CHOL 205 (H) 02/28/2021   CHOL 196 11/22/2020   TRIG 184.0 (H) 02/28/2021   HDL 43.60 02/28/2021   HDL 46 11/22/2020   LDLCALC 125 (H) 02/28/2021   LDLCALC 123 (H) 11/22/2020   LABVLDL 27 11/22/2020    Objective:  BP 112/74 (BP Location: Left Arm, Patient Position: Sitting, Cuff Size: Large)    Pulse 76    Temp 97.6 F (36.4 C) (Oral)    Ht  5' 4.75" (1.645 m)    Wt 214 lb 3.2 oz (97.2 kg)    LMP 09/27/2016 (Approximate)    SpO2 98%    BMI 35.92 kg/m   Weight: 214 lb 3.2 oz (97.2 kg)   BP Readings from Last 3 Encounters:  02/28/21 112/74  02/07/21 120/84  01/06/21 98/65   Wt Readings from Last 3 Encounters:  02/28/21 214 lb 3.2 oz (97.2 kg)  02/07/21 217 lb 6.4 oz (98.6 kg)  01/06/21 217 lb (98.4 kg)    Physical Exam Constitutional:      General: She is not in acute distress.    Appearance: She is well-developed.  HENT:     Head: Normocephalic and atraumatic.     Right Ear: External ear normal.     Left Ear: External ear normal.     Mouth/Throat:     Pharynx: No oropharyngeal exudate.  Eyes:     Conjunctiva/sclera: Conjunctivae normal.     Pupils: Pupils are equal, round, and reactive to light.  Neck:     Thyroid: No thyromegaly.  Cardiovascular:     Rate and Rhythm: Normal rate and regular rhythm.     Heart sounds: Normal heart sounds. No murmur heard. No friction rub. No gallop.   Pulmonary:     Effort: Pulmonary effort is normal.     Breath sounds: Normal breath sounds.  Abdominal:     General: Bowel sounds are normal. There is no distension.     Palpations: Abdomen is soft. There is no mass.     Tenderness: There is no abdominal tenderness. There is no guarding.     Hernia: No hernia is present.  Genitourinary:    Exam position: Supine.     Vagina: Normal.     Cervix: Normal.     Uterus: Absent.   Musculoskeletal:        General: No tenderness or deformity. Normal range of motion.     Cervical back: Normal range of motion and neck supple.     Comments: Pain with  external rotation right hip; pain with standing/walking just in exam room.   Lymphadenopathy:     Cervical: No cervical adenopathy.  Skin:    General: Skin is warm and dry.     Findings: Rash present.     Comments: Brown macules lateral left ankle, dorsal right foot  Neurological:     Mental Status: She is alert and oriented to person,  place, and time.     Deep Tendon Reflexes: Reflexes normal.     Reflex Scores:      Tricep reflexes are 2+ on the right side and 2+ on the left side.      Bicep reflexes are 2+ on the right side and 2+ on the left side.      Brachioradialis reflexes are 2+ on the right side and 2+ on the left side.      Patellar reflexes are 2+ on the right side and 2+ on the left side. Psychiatric:        Speech: Speech normal.        Behavior: Behavior normal.        Thought Content: Thought content normal.     Assessment/Plan: Health Maintenance Due  Topic Date Due   PNEUMOCOCCAL POLYSACCHARIDE VACCINE AGE 48-64 HIGH RISK  Never done   COVID-19 Vaccine (3 - Booster for Moderna series) 10/11/2020   Health Maintenance reviewed - mammogram ordered today. shingrix and prevnar 20 given today.   1. Preventative health care Mammogram, shingrix, prevnar 20. Encouraged quitting smoking as well as regular exercise.  She has plans to quit smoking prior to any surgery and understands significant benefit of quitting smoking in this regard as well as her overall health.  2. Primary hypertension Continue with hydrochlorothiazide 25 mg daily, losartan 50 mg daily - CBC with Differential/Platelet; Future - Comprehensive metabolic panel; Future  3. Non-seasonal allergic rhinitis due to pollen Continue with Zyrtec regularly.  4. OSA (obstructive sleep apnea) She is trying to be compliant with CPAP, but she has a lot of aches and pains that wake her frequently through the night.  5. Gastroesophageal reflux disease without esophagitis Currently well controlled without medication.  6. Hepatic steatosis Liver function have been stable.  Recheck blood work today.  7. Controlled type 2 diabetes mellitus with hyperglycemia, without long-term current use of insulin (HCC) We discussed importance of regular exercise as well as low carbohydrate eating.  Diet controlled.  She is on ARB for kidney control.  She is on  statin for cholesterol control/primary prevention. - Hemoglobin A1c; Future - Microalbumin / creatinine urine ratio; Future  8. Hyperlipidemia associated with type 2 diabetes mellitus ([place]) Blood work returned prior to note completion.  Going to switch her from pravastatin to Crestor for better LDL control. - Lipid panel; Future - TSH; Future  9. Primary osteoarthritis involving multiple joints She is following regularly with Ortho.  She is working on weight loss and quitting smoking since that interventional procedures will be more successful.  10.  Osteopenia Continue with vitamin D supplementation, encourage weightbearing exercise.  11. Vitamin D deficiency - VITAMIN D 25 Hydroxy (Vit-D Deficiency, Fractures); Future  12. Attention deficit hyperactivity disorder (ADHD), unspecified ADHD type Continue with adderall 10mg  XR daily.  13. Dermatitis - Protime-INR; Future - Sedimentation rate; Future  14. Encounter for screening mammogram for malignant neoplasm of breast - MM DIGITAL SCREENING BILATERAL; Future  15. Cervical cancer screening - PAP [[place]]  Tobacco dependence: she has chantix at home she wants to  find and start. She will let me know if she can't find this. Discussed negative health consequences of smoking and benefits of smoking cessation with patient in detail. Discussed medical treatment options including patches, gums, zyban, and chantix. Encouraged to let me know if any concerns or if needing help with quitting. 1-800-QUIT-NOW also available to help through quitting process.  Willing to quit? yes Plan for quitting: starting chantix, already has at home Follow up in 3 months    Return in about 6 months (around 08/31/2021) for Chronic condition visit.  Micheline Rough, MD

## 2021-03-01 LAB — CYTOLOGY - PAP
Comment: NEGATIVE
Diagnosis: NEGATIVE
High risk HPV: NEGATIVE

## 2021-03-01 MED ORDER — ROSUVASTATIN CALCIUM 10 MG PO TABS: 10.0000 mg | ORAL_TABLET | Freq: Every day | ORAL | 0 refills | Status: DC

## 2021-03-06 ENCOUNTER — Other Ambulatory Visit: Payer: Self-pay | Admitting: Family Medicine

## 2021-03-07 ENCOUNTER — Ambulatory Visit
Admission: RE | Admit: 2021-03-07 | Discharge: 2021-03-07 | Disposition: A | Discharge status: Home / Self Care | Payer: 59 | Source: Ambulatory Visit | Attending: Acute Care | Admitting: Acute Care

## 2021-03-07 DIAGNOSIS — Z87891 Personal history of nicotine dependence: Secondary | ICD-10-CM

## 2021-03-07 DIAGNOSIS — F17210 Nicotine dependence, cigarettes, uncomplicated: Secondary | ICD-10-CM

## 2021-03-14 ENCOUNTER — Telehealth: Payer: Self-pay | Admitting: Acute Care

## 2021-03-14 DIAGNOSIS — F17210 Nicotine dependence, cigarettes, uncomplicated: Secondary | ICD-10-CM

## 2021-03-14 DIAGNOSIS — Z87891 Personal history of nicotine dependence: Secondary | ICD-10-CM

## 2021-03-14 NOTE — Telephone Encounter (Signed)
Left message for pt to call regarding CT results.

## 2021-03-21 NOTE — Telephone Encounter (Signed)
Left detailed message per DPR with CT results per Eric Form, NP.   Copy sent to PCP.  Order placed for 1 yr f/u CT.

## 2021-03-24 NOTE — Progress Notes (Signed)
Please call patient and let them  know their  low dose Ct was read as a Lung RADS 2: nodules that are benign in appearance and behavior with a very low likelihood of becoming a clinically active cancer due to size or lack of growth. Recommendation per radiology is for a repeat LDCT in 12 months. .Please let them  know we will order and schedule their  annual screening scan for 02/2022. Please let them  know there was notation of CAD on their  scan.  Please remind the patient  that this is a non-gated exam therefore degree or severity of disease  cannot be determined. Please have them  follow up with their PCP regarding potential risk factor modification, dietary therapy or pharmacologic therapy if clinically indicated. Pt.  is  currently on statin therapy. Please place order for annual  screening scan for  02/2022 and fax results to PCP. Thanks so much.

## 2021-04-01 ENCOUNTER — Other Ambulatory Visit: Payer: Self-pay | Admitting: Family Medicine

## 2021-04-01 ENCOUNTER — Encounter: Payer: Self-pay | Admitting: Family Medicine

## 2021-04-01 NOTE — Telephone Encounter (Signed)
Please advise 

## 2021-04-25 ENCOUNTER — Other Ambulatory Visit: Payer: Self-pay

## 2021-04-25 ENCOUNTER — Ambulatory Visit: Payer: 59 | Admitting: Family Medicine

## 2021-04-25 ENCOUNTER — Encounter: Payer: Self-pay | Admitting: Family Medicine

## 2021-04-25 VITALS — BP 112/80 | HR 90 | Temp 97.3°F | Ht 64.75 in | Wt 221.0 lb

## 2021-04-25 DIAGNOSIS — F321 Major depressive disorder, single episode, moderate: Secondary | ICD-10-CM

## 2021-04-25 DIAGNOSIS — R110 Nausea: Secondary | ICD-10-CM

## 2021-04-25 DIAGNOSIS — R519 Headache, unspecified: Secondary | ICD-10-CM | POA: Diagnosis not present

## 2021-04-25 DIAGNOSIS — G2581 Restless legs syndrome: Secondary | ICD-10-CM

## 2021-04-25 DIAGNOSIS — R232 Flushing: Secondary | ICD-10-CM

## 2021-04-25 MED ORDER — FLUOXETINE HCL 10 MG PO TABS: 10.0000 mg | ORAL_TABLET | Freq: Every day | ORAL | 3 refills | Status: DC

## 2021-04-25 NOTE — Patient Instructions (Signed)
Consider seeing Charlann Boxer for osteopathic manipulation.

## 2021-04-25 NOTE — Progress Notes (Signed)
Lori [name] DOB: July 08, 1966 Encounter date: 04/25/2021  This is a 55 y.o. female who presents with Chief Complaint  Patient presents with  . Headache    Patient complains of a headache, facial and neck pain x2 months, tried Tylenol, Sudafed and allergy medication with no relief, negative home Covid test more than 2 weeks ago  . Nausea    X2 weeks  . Depression    Worse past 2 weeks  . Chills    History of present illness: Last visit was 02/28/21.   Hot flashes; worse at night; then cold. Just cut hair to help with hot flashes. Doesn't matter if she wears short or long sleeves.   Took zoloft in past - too much of not caring with this.   Headaches have gotten worse. Last month when they started she thought it was related to sinus/pollen. Started with sudafed, which usually helps. Not just eyes/face, but temples, behind ears, posterior neck.   Nausea started a couple of weeks ago. Did covid test - so nauseated and tired one morning last week she called in sick to work. Nurse at work told her to see pcp because she has been weepy -has spells where she can't talk because she is crying.   Stiff, can't get around. Gaining weight. House is a mess. Can't get anything done. Can't stand to get dishes done; do anything without having back pain/shoulder and neck pain; sciatica starts kicking in. She has been trying to postpone shot for this.   Feels like more than 50lb weight just pushing her in thoracic spine; feels like it pushes her back. Still gets pain/pressure pushing in on bilat hips.   She did have shot for bursitis right side - this has helped some.   Gets up 4:30-5; gets home from work about 6. On weekends feels worn out. Just hurts whenever she tries to do anything. Very depressed. No energy or motivation to try and get through things.   Quit taking effexor - just wasn't sure if it was helping. Reason why she restarted her wellbutrin at least a year ago.   Hands tight in morning,  night. Feet feel like they are pulsating/throbbing in evening. Also getting restless leg syndrome. Arms, elbows, knees, legs seem affected.   Allergies  Allergen Reactions  . Amoxicillin Nausea Only    Yeast Infection Has patient had a PCN reaction causing immediate rash, facial/tongue/throat swelling, SOB or lightheadedness with hypotension: no Has patient had a PCN reaction causing severe rash involving mucus membranes or skin necrosis: no Has patient had a PCN reaction that required hospitalization yes Has patient had a PCN reaction occurring within the last 10 years: yes If all of the above answers are "NO", then may proceed with Cephalosporin use.  Yeast Infection Has patient had a PCN reaction causing immediate rash, facial/tongue/throat swelling, SOB or lightheadedness with hypotension: no Has patient had a PCN reaction causing severe rash involving mucus membranes or skin necrosis: no Has patient had a PCN reaction that required hospitalization yes Has patient had a PCN reaction occurring within the last 10 years: yes If all of the above answers are "NO", then may proceed with Cephalosporin use.  . Eggs Or Egg-Derived Products   . Morphine Other (See Comments)    REACTION: hyper REACTION: hyper  . Thiethylperazine Other (See Comments)  . Latex Rash   Current Meds  Medication Sig  . alendronate (FOSAMAX) 70 MG tablet TAKE 1 TABLET (70 MG TOTAL) BY MOUTH EVERY  7 (SEVEN) DAYS.  Marland Kitchen amphetamine-dextroamphetamine (ADDERALL XR) 10 MG 24 hr capsule Take 1 capsule (10 mg total) by mouth daily.  Marland Kitchen buPROPion (WELLBUTRIN XL) 150 MG 24 hr tablet TAKE 2 TABLETS (300 MG TOTAL) BY MOUTH DAILY.  . cetirizine (ZYRTEC) 10 MG tablet Take 10 mg by mouth at bedtime.  . Cholecalciferol (VITAMIN D3 PO) Take 1,000 Units by mouth daily.  . diazepam (VALIUM) 5 MG tablet TAKE 1 TABLET BY MOUTH EVERY 12 HOURS AS NEEDED FOR ANXIETY OR MUSCLE SPASMS  . fluconazole (DIFLUCAN) 150 MG tablet fluconazole 150 mg  tablet  TAKE 1 TABLET (150 MG TOTAL) BY MOUTH ONCE FOR 1 DOSE. REPEAT IN 72 HOURS IF SYMPTOMS STILL PRESENT.  . fluticasone (FLONASE) 50 MCG/ACT nasal spray Place 2 sprays into both nostrils daily.  . hydrochlorothiazide (HYDRODIURIL) 25 MG tablet TAKE 1 TABLET BY MOUTH EVERY DAY  . ibuprofen (ADVIL) 200 MG tablet Take 800 mg by mouth as needed.  Marland Kitchen ketoconazole (NIZORAL) 2 % cream Apply topically.  Marland Kitchen losartan (COZAAR) 50 MG tablet TAKE 1 TABLET BY MOUTH EVERY DAY  . Multiple Vitamins-Minerals (EMERGEN-C IMMUNE PO) Take 1 tablet by mouth daily. With zinc  . Oxycodone HCl 10 MG TABS Take 10 mg by mouth 5 (five) times daily as needed.  . rosuvastatin (CRESTOR) 10 MG tablet Take 1 tablet (10 mg total) by mouth daily.  Marland Kitchen tiZANidine (ZANAFLEX) 2 MG tablet Take by mouth every 6 (six) hours as needed for muscle spasms.   Current Facility-Administered Medications for the 04/25/21 encounter (Office Visit) with Caren Macadam, MD  Medication  . 0.9 %  sodium chloride infusion    Review of Systems  Constitutional: Positive for fatigue. Negative for chills and fever. Unexpected weight change: gaining weight.  Respiratory: Negative for cough, chest tightness, shortness of breath and wheezing.   Cardiovascular: Negative for chest pain, palpitations and leg swelling.  Musculoskeletal: Positive for arthralgias, back pain and gait problem.  Neurological: Positive for headaches (come and go; better when not at work or when able to relax after work). Negative for dizziness and light-headedness.  Psychiatric/Behavioral: Positive for decreased concentration and sleep disturbance (due to cpap, back pain). Negative for suicidal ideas.    Objective:  BP 112/80 (BP Location: Left Arm, Patient Position: Sitting, Cuff Size: Large)   Pulse 90   Temp (!) 97.3 F (36.3 C) (Oral)   Ht 5' 4.75" (1.645 m)   Wt 221 lb (100.2 kg)   LMP 09/27/2016 (Approximate)   SpO2 95%   BMI 37.06 kg/m   Weight: 221 lb (100.2  kg)   BP Readings from Last 3 Encounters:  04/25/21 112/80  02/28/21 112/74  02/07/21 120/84   Wt Readings from Last 3 Encounters:  04/25/21 221 lb (100.2 kg)  02/28/21 214 lb 3.2 oz (97.2 kg)  02/07/21 217 lb 6.4 oz (98.6 kg)    Physical Exam Constitutional:      General: She is not in acute distress.    Appearance: She is obese.  Neck:     Comments: Posterior chain LN left 0.5cm; trap tenderness, spasm bilat.  Cardiovascular:     Rate and Rhythm: Normal rate and regular rhythm.     Heart sounds: Normal heart sounds. No murmur heard. No friction rub.  Pulmonary:     Effort: Pulmonary effort is normal. No respiratory distress.     Breath sounds: Normal breath sounds. No wheezing or rales.  Abdominal:     General: Abdomen is protuberant. Bowel  sounds are normal.     Palpations: Abdomen is soft.     Tenderness: There is no abdominal tenderness.  Musculoskeletal:     Right lower leg: No edema.     Left lower leg: No edema.  Neurological:     Mental Status: She is alert and oriented to person, place, and time.  Psychiatric:        Attention and Perception: Attention normal.        Mood and Affect: Mood is anxious and depressed.        Speech: Speech normal.        Behavior: Behavior normal.        Cognition and Memory: Cognition normal.        Judgment: Judgment normal.     Assessment/Plan  1. Hot flashes Hot flashes are very disruptive to her daily routine as well as sleep.  I think she would do very well with some estrogen replacement, but I am hesitant to give this to her due to her ongoing smoking.  We discussed the risk of blood clot with smoking in addition to hormone, and she is willing to try to quit.  Her confidence level for being able to quit is low.  She did not feel significant benefit from mood standpoint with the Effexor and it did not seem to help with hot flashes or headaches.  We are going to try Prozac (her daughter does well on this medication) and  started a lower dose.  I would like to check in with her closely.  I am hopeful that she will be able to stop smoking and that we can start estrogen replacement (estradiol 1 mg daily) to further help with hot flashes. - FLUoxetine (PROZAC) 10 MG tablet; Take 1 tablet (10 mg total) by mouth daily.  Dispense: 90 tablet; Refill: 3  2. Nausea Significant increase in anxiety/depression/stress. No vomiting. No abd pain.  We discussed considering GLP-1 to help with weight loss, but I worry that this may increase nausea.  I would like for her to start the Prozac and see if nausea improves before adding on additional medications.  Let me know if any worsening of symptoms.  3. Nonintractable headache, unspecified chronicity pattern, unspecified headache type She has chronic neck pain and chronic muscular pain in neck and shoulders.  I think it would be very helpful for her to regularly see sports medicine for osteopathic manipulations.  I have encouraged her to follow-up with their group.  4. Restless legs This is a chronic issue.  She did have blood work done less than 2 months ago which was all stable.  We can consider additional blood work in the future, but I think it would like to see how she does on Prozac first.  Some of the difficulties with sleeping are related to hot flashes as well as chronic pain.  Working on treating hot flashes may additionally help make restless legs less noticeable.  5. Depression, major, single episode, moderate ([place]) See above.  I am hoping that with Prozac in conjunction with the Wellbutrin she will get some improvement in mood overall.  I additionally think that some hormone replacement will help with mood. - FLUoxetine (PROZAC) 10 MG tablet; Take 1 tablet (10 mg total) by mouth daily.  Dispense: 90 tablet; Refill: 3   Return for 1-2 weeks virtual is ok.   42 minutes spent in chart review, time with patient, charting, but mostly discussing treatment options.  She would  like  to work on weight loss, mood, hot flashes, headaches, nausea but it does seem that these are all related we will have to work through them in a stepwise fashion.  I do feel that hormone replacement would be helpful for many of these symptoms as well as vaginal dryness, which is been an issue for her, but I would like for her to quit smoking prior to starting hormonal replacement treatment.  We discussed weight loss treatment options, with her other chronic conditions I do think that a GLP-1 may be a nice add-on to help with weight loss.  Uncertain about coverage for this medication and/or tolerance.  Micheline Rough, MD

## 2021-04-27 ENCOUNTER — Other Ambulatory Visit: Payer: Self-pay

## 2021-04-27 ENCOUNTER — Ambulatory Visit: Payer: 59 | Admitting: Family Medicine

## 2021-04-27 ENCOUNTER — Ambulatory Visit
Admission: RE | Admit: 2021-04-27 | Discharge: 2021-04-27 | Disposition: A | Discharge status: Home / Self Care | Payer: 59 | Source: Ambulatory Visit | Attending: Family Medicine | Admitting: Family Medicine

## 2021-04-27 DIAGNOSIS — Z1231 Encounter for screening mammogram for malignant neoplasm of breast: Secondary | ICD-10-CM

## 2021-05-02 ENCOUNTER — Telehealth (INDEPENDENT_AMBULATORY_CARE_PROVIDER_SITE_OTHER): Payer: 59 | Admitting: Family Medicine

## 2021-05-02 DIAGNOSIS — F321 Major depressive disorder, single episode, moderate: Secondary | ICD-10-CM | POA: Diagnosis not present

## 2021-05-02 DIAGNOSIS — E1165 Type 2 diabetes mellitus with hyperglycemia: Secondary | ICD-10-CM

## 2021-05-02 DIAGNOSIS — M542 Cervicalgia: Secondary | ICD-10-CM | POA: Diagnosis not present

## 2021-05-02 DIAGNOSIS — R232 Flushing: Secondary | ICD-10-CM

## 2021-05-02 NOTE — Progress Notes (Signed)
Virtual Visit via Video Note  I connected with Lori [name]  on 05/02/21 at 12:00 PM EDT by a video enabled telemedicine application and verified that I am speaking with the correct person using two identifiers.  Location patient: home Location provider: [place], [place] [address] Persons participating in the virtual visit: patient, provider  I discussed the limitations of evaluation and management by telemedicine and the availability of in person appointments. The patient expressed understanding and agreed to proceed.   Lori [name] DOB: July 02, 1966 Encounter date: 05/02/2021  This is a 55 y.o. female who presents with Chief Complaint  Patient presents with  . Depression  . Hot Flashes    History of present illness: Last visit was 2 weeks ago; discussed hot flashes, depressed mood, weight gain at that visit. We added prozac to hopefully help with mood and encouraged her to work on quitting smoking so that we could discuss possible HRT due to excessive hot flashes.   Has noted some slight improvement with prozac - less checking on things like curling iron being on. Less worrying about not completing tasks.   Maybe noted some improvement in hot flashes - didn't change clothes over weekend.   After talking about quitting smoking; she got worse. Instead of 5 a day; she was smoking during lunch at work.   Sinus pressure is a little better - still gets little headache behind eye and down to neck, which is tight. Did set up appointment with Dr. Tamala Julian but this wasn't until June.   Weight gain: interested in weight loss medication. Nausea has gone away.   Allergies  Allergen Reactions  . Amoxicillin Nausea Only    Yeast Infection Has patient had a PCN reaction causing immediate rash, facial/tongue/throat swelling, SOB or lightheadedness with hypotension: no Has patient had a PCN reaction causing severe rash involving mucus membranes or skin  necrosis: no Has patient had a PCN reaction that required hospitalization yes Has patient had a PCN reaction occurring within the last 10 years: yes If all of the above answers are "NO", then may proceed with Cephalosporin use.  Yeast Infection Has patient had a PCN reaction causing immediate rash, facial/tongue/throat swelling, SOB or lightheadedness with hypotension: no Has patient had a PCN reaction causing severe rash involving mucus membranes or skin necrosis: no Has patient had a PCN reaction that required hospitalization yes Has patient had a PCN reaction occurring within the last 10 years: yes If all of the above answers are "NO", then may proceed with Cephalosporin use.  . Eggs Or Egg-Derived Products   . Morphine Other (See Comments)    REACTION: hyper REACTION: hyper  . Thiethylperazine Other (See Comments)  . Latex Rash   No outpatient medications have been marked as taking for the 05/02/21 encounter (Video Visit) with Caren Macadam, MD.   Current Facility-Administered Medications for the 05/02/21 encounter (Video Visit) with Caren Macadam, MD  Medication  . 0.9 %  sodium chloride infusion    Review of Systems  Constitutional: Negative for chills, fatigue and fever.  Respiratory: Negative for cough, chest tightness, shortness of breath and wheezing.   Cardiovascular: Negative for chest pain, palpitations and leg swelling.  Musculoskeletal: Positive for neck pain and neck stiffness.  Psychiatric/Behavioral:       Mood has felt a little better - less overly-focused    Objective:  LMP 09/27/2016 (Approximate)       BP Readings from  Last 3 Encounters:  04/25/21 112/80  02/28/21 112/74  02/07/21 120/84   Wt Readings from Last 3 Encounters:  04/25/21 221 lb (100.2 kg)  02/28/21 214 lb 3.2 oz (97.2 kg)  02/07/21 217 lb 6.4 oz (98.6 kg)    EXAM:  GENERAL: alert, oriented, appears well and in no acute distress  HEENT: atraumatic, conjunctiva clear, no  obvious abnormalities on inspection of external nose and ears  NECK: normal movements of the head and neck  LUNGS: on inspection no signs of respiratory distress, breathing rate appears normal, no obvious gross SOB, gasping or wheezing  CV: no obvious cyanosis  MS: moves all visible extremities without noticeable abnormality  PSYCH/NEURO: pleasant and cooperative, no obvious depression or anxiety, speech and thought processing grossly intact   Assessment/Plan  1. Controlled type 2 diabetes mellitus with hyperglycemia, without long-term current use of insulin ([place]) Will add rybelsus - sample will be put up front. Discussed new medication(s) today with patient. Discussed potential side effects and patient verbalized understanding. We are looking for help with weight loss that will also keep sugars controlled.   2. Hot flashes Has had some improvement with prozac. We are going to increase to 20mg  to get more adult therapeutic dose for mood/motivation piece. She will let me know if any trouble with this. She has enough of 10mg  to just double up for now.   3. Depression, major, single episode, moderate ([place]) See above. Some improvement with prozac. Continue with wellbutrin.  4. Neck pain Has visit with dr. Tamala Julian next month; we will schedule our follow up after that to touch base.     I discussed the assessment and treatment plan with the patient. The patient was provided an opportunity to ask questions and all were answered. The patient agreed with the plan and demonstrated an understanding of the instructions.   The patient was advised to call back or seek an in-person evaluation if the symptoms worsen or if the condition fails to improve as anticipated.  I provided 30 minutes of non-face-to-face time during this encounter.   Micheline Rough, MD

## 2021-05-03 ENCOUNTER — Telehealth: Payer: Self-pay | Admitting: *Deleted

## 2021-05-03 ENCOUNTER — Other Ambulatory Visit: Payer: Self-pay | Admitting: *Deleted

## 2021-05-03 MED ORDER — RYBELSUS 3 MG PO TABS: 3.0000 mg | ORAL_TABLET | Freq: Every day | ORAL | 0 refills | Status: AC

## 2021-05-03 NOTE — Telephone Encounter (Signed)
-----   Message from Caren Macadam, MD sent at 05/02/2021 12:23 PM EDT ----- Please set up rybelsus sample 3mg  dose for patient up front. Please set up 1 month follow up visit (virtual still ok but also ok for in person if she desires for weight recheck)

## 2021-05-03 NOTE — Telephone Encounter (Signed)
Spoke with the patient and informed her per Lavella Lemons the sample was left at the front desk for pick up.  Per patients preference, a follow up appt for a virtual visit was scheduled on 6/24.

## 2021-05-16 ENCOUNTER — Encounter: Payer: Self-pay | Admitting: Family Medicine

## 2021-05-16 NOTE — Progress Notes (Signed)
I, [name], LAT, ATC, am serving as scribe for Dr. [name].  RAMAYA GUILE is a 55 y.o. female who presents to [place] [facility] today for back and L shoulder/scapular pain.  She was last seen by Dr. [name] on 02/07/21 for f/u of her R hip/groin pain and noted some low back pain too.  She was shown a HEP focusing mainly on her R hip aDductors due to a new location of her pain.  Since her last visit, pt reports new L shoulder/scapular and neck pain x 3-4 weeks.  She has also been noticing numbness and a cold sensation in her L medial arm and into her L 4th and 5th fingers.  She locates her pain to her L post scapula that radiates into her L lateral neck.  She con't to have pain her in lower back w/ radiating pain into her L LE all the way to her toes.  She describes this pain as a stinging, shooting pain.  Diagnostic testing: L-spine MRI- 07/21/20  Pertinent review of systems: No fevers or chills  Relevant historical information: History of cervical fusion at C7-T1 and history of lumbar fusion at L5-S1.   Exam:  BP 122/82 (BP Location: Left Arm, Patient Position: Sitting, Cuff Size: Normal)   Pulse 85   Ht 5' 4.75" (1.645 m)   Wt 224 lb 3.2 oz (101.7 kg)   LMP 09/27/2016 (Approximate)   SpO2 97%   BMI 37.60 kg/m  General: Well Developed, well nourished, and in no acute distress.   MSK: C-spine normal-appearing nontender midline normal cervical motion. Upper extremity strength reflexes and sensation are equal normal throughout. Tender palpation left rhomboid.  Left shoulder normal-appearing normal motion minimal pain with abduction. Intact strength abduction external and internal rotation.  Negative Hawkins and Neer's test.  Negative empty can test. Pain with rhomboid activation.  Left elbow normal-appearing nontender normal motion negative Tinel's over cubital tunnel.  L-spine nontender to midline normal lumbar motion negative slump test.  Reflexes  and strength are intact lower extremity.    Lab and Radiology Results  EXAM: MRI LUMBAR SPINE WITHOUT CONTRAST  TECHNIQUE: Multiplanar, multisequence MR imaging of the lumbar spine was performed. No intravenous contrast was administered.  COMPARISON:  2019  FINDINGS: Segmentation:  Standard.  Alignment:  Anteroposterior alignment is maintained.  Vertebrae: Vertebral body heights are preserved. There is degenerative endplate marrow edema at L2-L3 and too a much lesser extent at L3-L4. There is no suspicious osseous lesion.  Conus medullaris and cauda equina: Conus extends to the L1 level. Conus and cauda equina appear normal.  Paraspinal and other soft tissues: Unremarkable.  Disc levels:  L1-L2:  No canal or foraminal stenosis.  L2-L3: Disc bulge with new small right foraminal protrusion. No canal or left foraminal stenosis. New mild right foraminal stenosis.  L3-L4: Disc bulge and mild facet arthropathy. Increased mild canal stenosis. No foraminal stenosis.  L4-L5: Disc bulge with small central disc protrusion and mild facet arthropathy. Mild canal stenosis. No foraminal stenosis.  L5-S1: Prior right laminotomy. Disc bulge with endplate osteophytic ridging and mild facet arthropathy. No canal stenosis. Mild foraminal stenosis.  IMPRESSION: Multilevel degenerative changes as detailed above with mild progression since 2019. No high-grade stenosis. There is degenerative endplate marrow edema eccentric to the right at L2-L3 where there is a new small foraminal protrusion.   Electronically Signed   By: Macy Mis M.D.   On: 07/21/2020 15:26  I, [name], personally (  independently) visualized and performed the interpretation of the images attached in this note.    Assessment and Plan: 55 y.o. female with  Left arm pain and left thoracic back pain.  Multifactorial.  I believe the pain most likely due to rhomboid strain and possibly  cubital tunnel syndrome.  It is also possible that she has C8 radiculopathy.  Plan for home exercise program focused mostly on rhomboid strain and use night splint across the elbow joint for cubital tunnel syndrome.  If not improving patient will let me know and I will refer to physical therapy.  Leg pain thought to be related to lumbar radiculopathy at L5.  Patient already has established relationship with pain management at Dr. Greta Doom.  Recommend that she contact Dr. Greta Doom to proceed with epidural steroid injection at L4-L5 to hopefully treat L5 radiculopathy.    Discussed warning signs or symptoms. Please see discharge instructions. Patient expresses understanding.   The above documentation has been reviewed and is accurate and complete [name], M.D.

## 2021-05-17 ENCOUNTER — Ambulatory Visit: Payer: 59 | Admitting: Family Medicine

## 2021-05-17 ENCOUNTER — Ambulatory Visit: Payer: Self-pay

## 2021-05-17 ENCOUNTER — Other Ambulatory Visit: Payer: Self-pay

## 2021-05-17 ENCOUNTER — Encounter: Payer: Self-pay | Admitting: Family Medicine

## 2021-05-17 VITALS — BP 122/82 | HR 85 | Ht 64.75 in | Wt 224.2 lb

## 2021-05-17 DIAGNOSIS — G5622 Lesion of ulnar nerve, left upper limb: Secondary | ICD-10-CM | POA: Diagnosis not present

## 2021-05-17 DIAGNOSIS — M5416 Radiculopathy, lumbar region: Secondary | ICD-10-CM

## 2021-05-17 DIAGNOSIS — M25512 Pain in left shoulder: Secondary | ICD-10-CM | POA: Diagnosis not present

## 2021-05-17 DIAGNOSIS — S29012A Strain of muscle and tendon of back wall of thorax, initial encounter: Secondary | ICD-10-CM | POA: Diagnosis not present

## 2021-05-17 NOTE — Patient Instructions (Addendum)
Thank you for coming in today.  Do the home exercises for rhomboid muscle strain.   Please perform the exercise program that we have prepared for you and gone over in detail on a daily basis.  In addition to the handout you were provided you can access your program through: [email or web address]   Your unique program code is:  [email or web address]  Try the night splint made out of a towel for cubital tunnel syndrome.   If not improving let me know and I can order PT. Let me know were you want to go.   As Dr Greta Doom to do an injection for left L5 radiculopathy at the next visit or sooner.

## 2021-05-18 NOTE — Progress Notes (Signed)
[place] Phone: [phone] Subjective:   [name], am serving as a scribe for Dr. Hulan Saas. This visit occurred during the SARS-CoV-2 public health emergency.  Safety protocols were in place, including screening questions prior to the visit, additional usage of staff PPE, and extensive cleaning of exam room while observing appropriate contact time as indicated for disinfecting solutions.   I'm seeing this patient by the request  of:  Lori [name], Lori Berg, MD  CC: Back and neck pain  CWC:[id number]  Lori [name] is a 55 y.o. female coming in with complaint of lower back and neck pain. Patient states that she has had 3 spinal surgeries with the last one being a plate in the upper thoracic lower cervical area. Patient gets headaches regularly and her PCP recommended her to be seen by [name]. Has been working on stretches for L shoulder pain and numbness that radiates into L hand. Patient notes waking up with numbness in both arms as well as cold sensation. Is working on [employer]. Complains of overall tightness in spinal musculature.  Patient gets epidural injections annually in the fall from pain management. Does use massage for pain relief.       Past Medical History:  Diagnosis Date   ALLERGIC RHINITIS 03/14/2010   BACK PAIN 01/24/2010   Buttock pain    Constipation    Diabetes ([place])    Fatty liver    GERD 12/01/2008   HBP (high blood pressure)    HIATAL HERNIA 05/27/2010   Hot flashes    Hyperlipidemia    Hypertrophy of tongue papillae 03/14/2010   IBS (irritable bowel syndrome)    Joint pain    Numbness and tingling of left upper and lower extremity    OSTEOARTHRITIS 12/01/2008   Osteoporosis 05/17/2020   Fragility fracture plus T score -1.8 on DEXA scan June 2021   Sciatica, left side    Sleep apnea    Sleep apnea    Thigh pain    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   TOBACCO USE 12/01/2008   VITAMIN D DEFICIENCY  05/23/2010   Past Surgical History:  Procedure Laterality Date   ABDOMINAL HYSTERECTOMY N/A 10/17/2016   Procedure: HYSTERECTOMY ABDOMINAL;  Surgeon: Sanjuana Kava, MD;  Location: [facility]; benign mass in uterus   APPENDECTOMY     BREAST SURGERY     lumpectomy   CERVICAL FUSION     x2; 2007, 2011   [place] N/A 10/17/2016   Procedure: CYSTOSCOPY;  Surgeon: Sanjuana Kava, MD;  Location: [facility];  Service: Gynecology;  Laterality: N/A;   DILATION AND CURETTAGE OF UTERUS     miscarriage   ESOPHAGEAL MANOMETRY     ESOPHAGOGASTRODUODENOSCOPY     GASTRIC FUNDOPLICATION     [place]   LAPAROSCOPIC [place]ISSEN FUNDOPLICATION     LUMBAR DISC SURGERY     LUMBAR LAMINECTOMY  2005   SALPINGOOPHORECTOMY Bilateral 10/17/2016   Procedure: SALPINGO OOPHORECTOMY;  Surgeon: Sanjuana Kava, MD;  Location: [facility];  Service: Gynecology;  Laterality: Bilateral;   TONSILLECTOMY AND ADENOIDECTOMY     UPPER GASTROINTESTINAL ENDOSCOPY     Social History   Socioeconomic History   Marital status: Significant Other    Spouse name: Alver Sorrow   Number of children: 1   Years of education: Not on file   Highest education level: Not on file  Occupational History  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  Tobacco Use   Smoking status: Every Day    Packs/day: 1.00    Years: 36.00    Pack years: 36.00    Types: Cigarettes   Smokeless tobacco: Never   Tobacco comments:    now smoking 0.5 packs a day  Vaping Use   Vaping Use: Former   Devices: tried them for a month or 2  Substance and Sexual Activity   Alcohol use: Yes    Comment: social   Drug use: No   Sexual activity: Not on file  Other Topics Concern   Not on file  Social History Narrative   Not on file   Social Determinants of Health   Financial Resource Strain: Not on file  Food Insecurity: Not on file  Transportation Needs: Not on file  Physical Activity: Not on file  Stress: Not on file  Social  Connections: Not on file   Allergies  Allergen Reactions   Amoxicillin Nausea Only    Yeast Infection Has patient had a PCN reaction causing immediate rash, facial/tongue/throat swelling, SOB or lightheadedness with hypotension: no Has patient had a PCN reaction causing severe rash involving mucus membranes or skin necrosis: no Has patient had a PCN reaction that required hospitalization yes Has patient had a PCN reaction occurring within the last 10 years: yes If all of the above answers are "NO", then may proceed with Cephalosporin use.  Yeast Infection Has patient had a PCN reaction causing immediate rash, facial/tongue/throat swelling, SOB or lightheadedness with hypotension: no Has patient had a PCN reaction causing severe rash involving mucus membranes or skin necrosis: no Has patient had a PCN reaction that required hospitalization yes Has patient had a PCN reaction occurring within the last 10 years: yes If all of the above answers are "NO", then may proceed with Cephalosporin use.   Eggs Or Egg-Derived Products    Morphine Other (See Comments)    REACTION: hyper REACTION: hyper   Thiethylperazine Other (See Comments)   Latex Rash   Family History  Problem Relation Age of Onset   Breast cancer Mother 47   High blood pressure Mother    High Cholesterol Mother    Diabetes Mother    Diverticulitis Mother 14   Heart attack Mother 95       during hospitalization with diverticulitis   Heart disease Mother    Obesity Mother    Alzheimer's disease Father    Alcohol abuse Father    Sleep apnea Father    Colon polyps Half-Sister    Colonic polyp Half-Brother    Esophageal cancer Neg Hx    Colon cancer Neg Hx    Rectal cancer Neg Hx    Stomach cancer Neg Hx     Current Outpatient Medications (Endocrine & Metabolic):    alendronate (FOSAMAX) 70 MG tablet, TAKE 1 TABLET (70 MG TOTAL) BY MOUTH EVERY 7 (SEVEN) DAYS.   Semaglutide (RYBELSUS) 3 MG TABS, Take 3 mg by mouth  daily.   Current Outpatient Medications (Cardiovascular):    hydrochlorothiazide (HYDRODIURIL) 25 MG tablet, TAKE 1 TABLET BY MOUTH EVERY DAY   losartan (COZAAR) 50 MG tablet, TAKE 1 TABLET BY MOUTH EVERY DAY   rosuvastatin (CRESTOR) 10 MG tablet, Take 1 tablet (10 mg total) by mouth daily.   Current Outpatient Medications (Respiratory):    cetirizine (ZYRTEC) 10 MG tablet, Take 10 mg by mouth at bedtime.   fluticasone (FLONASE) 50 MCG/ACT nasal spray, Place  2 sprays into both nostrils daily.   Current Outpatient Medications (Analgesics):    ibuprofen (ADVIL) 200 MG tablet, Take 800 mg by mouth as needed.   Oxycodone HCl 10 MG TABS, Take 10 mg by mouth 5 (five) times daily as needed.     Current Outpatient Medications (Other):    amphetamine-dextroamphetamine (ADDERALL XR) 10 MG 24 hr capsule, Take 1 capsule (10 mg total) by mouth daily.   buPROPion (WELLBUTRIN XL) 150 MG 24 hr tablet, TAKE 2 TABLETS (300 MG TOTAL) BY MOUTH DAILY.   Cholecalciferol (VITAMIN D3 PO), Take 1,000 Units by mouth daily.   diazepam (VALIUM) 5 MG tablet, TAKE 1 TABLET BY MOUTH EVERY 12 HOURS AS NEEDED FOR ANXIETY OR MUSCLE SPASMS   fluconazole (DIFLUCAN) 150 MG tablet, fluconazole 150 mg tablet  TAKE 1 TABLET (150 MG TOTAL) BY MOUTH ONCE FOR 1 DOSE. REPEAT IN 72 HOURS IF SYMPTOMS STILL PRESENT.   FLUoxetine (PROZAC) 10 MG tablet, Take 1 tablet (10 mg total) by mouth daily.   ketoconazole (NIZORAL) 2 % cream, Apply topically.   Multiple Vitamins-Minerals (EMERGEN-C IMMUNE PO), Take 1 tablet by mouth daily. With zinc   tiZANidine (ZANAFLEX) 2 MG tablet, Take by mouth every 6 (six) hours as needed for muscle spasms.  Current Facility-Administered Medications (Other):    0.9 %  sodium chloride infusion   Reviewed prior external information including notes and imaging from  primary care provider As well as notes that were available from care everywhere and other healthcare systems.  Past medical history,  social, surgical and family history all reviewed in electronic medical record.  No pertanent information unless stated regarding to the chief complaint.   Review of Systems:  No visual changes, nausea, vomiting, diarrhea, constipation, dizziness, abdominal pain, skin rash, fevers, chills, night sweats, weight loss, swollen lymph nodes, joint swelling, chest pain, shortness of breath, mood changes. POSITIVE muscle aches, body aches, headache  Objective  Blood pressure 112/80, pulse 84, height 5\' 4"  (1.626 m), weight 224 lb (101.6 kg), last menstrual period 09/27/2016, SpO2 97 %.   General: No apparent distress alert and oriented x3 mood and affect normal, dressed appropriately.  Obese HEENT: Pupils equal, extraocular movements intact  Respiratory: Patient's speak in full sentences and does not appear short of breath  Cardiovascular: No lower extremity edema, non tender, no erythema  Gait normal with good balance and coordination.  MSK: Patient is in pain out of proportion to the amount of palpation in multiple different musculoskeletal places.  Patient's back does have significant tightness noted from the cervical to the lumbar area.  Patient once again tenderness seems to be out of proportion.  Does have tightness with FABER test secondary to body habitus.  Negative straight leg test.  Neck exam negative Spurling's.  Good range of motion with patient actually having status post fusion.  5 out of 5 strength of the upper extremities.   Osteopathic findings C5 flexed rotated and side bent right T3 extended rotated and side bent right with inhaled. T8 extended rotated and side bent left L3 flexed rotated and side bent right Sacrum left on left   [id number]; 15 additional minutes spent for Therapeutic exercises as stated in above notes.  This included exercises focusing on stretching, strengthening, with significant focus on eccentric aspects.   Long term goals include an improvement in range of motion,  strength, endurance as well as avoiding reinjury. Patient's frequency would include in 1-2 times a day, 3-5 times a week for a  duration of 6-12 weeks.  Low back exercises that included:  Pelvic tilt/bracing instruction to focus on control of the pelvic girdle and lower abdominal muscles  Glute strengthening exercises, focusing on proper firing of the glutes without engaging the low back muscles Proper stretching techniques for maximum relief for the hamstrings, hip flexors, low back and some rotation where tolerated Proper technique shown and discussed handout in great detail with ATC.  All questions were discussed and answered.   Impression and Recommendations:     The above documentation has been reviewed and is accurate and complete Lyndal Pulley, DO

## 2021-05-19 ENCOUNTER — Other Ambulatory Visit: Payer: Self-pay

## 2021-05-19 ENCOUNTER — Ambulatory Visit: Payer: 59 | Admitting: Family Medicine

## 2021-05-19 ENCOUNTER — Ambulatory Visit (INDEPENDENT_AMBULATORY_CARE_PROVIDER_SITE_OTHER): Payer: 59

## 2021-05-19 ENCOUNTER — Encounter: Payer: Self-pay | Admitting: Family Medicine

## 2021-05-19 VITALS — BP 112/80 | HR 84 | Ht 64.0 in | Wt 224.0 lb

## 2021-05-19 DIAGNOSIS — M9903 Segmental and somatic dysfunction of lumbar region: Secondary | ICD-10-CM

## 2021-05-19 DIAGNOSIS — M9908 Segmental and somatic dysfunction of rib cage: Secondary | ICD-10-CM

## 2021-05-19 DIAGNOSIS — M9904 Segmental and somatic dysfunction of sacral region: Secondary | ICD-10-CM

## 2021-05-19 DIAGNOSIS — M549 Dorsalgia, unspecified: Secondary | ICD-10-CM | POA: Diagnosis not present

## 2021-05-19 DIAGNOSIS — M9902 Segmental and somatic dysfunction of thoracic region: Secondary | ICD-10-CM

## 2021-05-19 DIAGNOSIS — M542 Cervicalgia: Secondary | ICD-10-CM | POA: Diagnosis not present

## 2021-05-19 DIAGNOSIS — M545 Low back pain, unspecified: Secondary | ICD-10-CM | POA: Diagnosis not present

## 2021-05-19 DIAGNOSIS — M546 Pain in thoracic spine: Secondary | ICD-10-CM

## 2021-05-19 DIAGNOSIS — M9901 Segmental and somatic dysfunction of cervical region: Secondary | ICD-10-CM | POA: Insufficient documentation

## 2021-05-19 DIAGNOSIS — G8929 Other chronic pain: Secondary | ICD-10-CM

## 2021-05-19 NOTE — Assessment & Plan Note (Signed)
   Decision today to treat with OMT was based on Physical Exam  After verbal consent patient was treated with  ME, FPR techniques in cervical, thoracic, rib, lumbar and sacral areas, all areas are chronic   Patient tolerated the procedure well with improvement in symptoms  Patient given exercises, stretches and lifestyle modifications  See medications in patient instructions if given  Patient will follow up in 4-8 weeks 

## 2021-05-19 NOTE — Patient Instructions (Signed)
Good to see you Tumeric 500 mg daily Tart chery extract 1200 mg at night Xrays today Exercise 3 times a week See me again in 5-6 weeks

## 2021-05-19 NOTE — Assessment & Plan Note (Signed)
Patient has had chronic back pain for quite some time.  Patient has what appears to be potentially even a polyarthralgia.  Could be potentially fibromyalgia or chronic pain syndrome.  We will get x-rays to further evaluate especially with patient not knowing exactly where the fusion in her neck is.  Patient responded well though to osteopathic manipulation and kept her more muscle energy today.  Patient given home exercises that I think will be beneficial for more of the scapular strength as well as posture and ergonomics.  Patient will follow up with me again in 6 to 8 weeks

## 2021-05-24 ENCOUNTER — Other Ambulatory Visit: Payer: Self-pay | Admitting: Family Medicine

## 2021-06-03 ENCOUNTER — Encounter: Payer: Self-pay | Admitting: Family Medicine

## 2021-06-03 ENCOUNTER — Telehealth (INDEPENDENT_AMBULATORY_CARE_PROVIDER_SITE_OTHER): Payer: 59 | Admitting: Family Medicine

## 2021-06-03 VITALS — Ht 64.0 in | Wt 224.0 lb

## 2021-06-03 DIAGNOSIS — Z6837 Body mass index (BMI) 37.0-37.9, adult: Secondary | ICD-10-CM

## 2021-06-03 DIAGNOSIS — R232 Flushing: Secondary | ICD-10-CM | POA: Diagnosis not present

## 2021-06-03 DIAGNOSIS — F321 Major depressive disorder, single episode, moderate: Secondary | ICD-10-CM | POA: Diagnosis not present

## 2021-06-03 DIAGNOSIS — E1165 Type 2 diabetes mellitus with hyperglycemia: Secondary | ICD-10-CM | POA: Diagnosis not present

## 2021-06-03 MED ORDER — RYBELSUS 7 MG PO TABS: 7.0000 mg | ORAL_TABLET | Freq: Every day | ORAL | 1 refills | Status: DC

## 2021-06-03 NOTE — Progress Notes (Signed)
Virtual Visit via Video Note  I connected with Lori [name] on 06/03/21 at 11:30 AM EDT by a video enabled telemedicine application and verified that I am speaking with the correct person using two identifiers.  Location patient: home Location provider: [place], [place] [address] Persons participating in the virtual visit: patient, provider  I discussed the limitations of evaluation and management by telemedicine and the availability of in person appointments. The patient expressed understanding and agreed to proceed.   Lori [name] DOB: 1966-08-26 Encounter date: 06/03/2021  This is a 55 y.o. female who presents with Chief Complaint  Patient presents with   Follow-up    History of present illness: Last visit was 05/02/21.   Hot flashes: prozac increased to 105m at last visit to help with this as well as mood. States that it has really helped with hot flashes which she is very happy about. Now noting just occasional hot flashes; usually triggered by coworkers heater.   Depression: wellbutrin, prozac (increased last visit). Mood is doing better. Set up two massages since last visit. She had trigger point injections and this really helped her on one side. Massage helped a lot with left side as well. Saw massage after seeing Dr. STamala Julian Combination of all of above was first time in years that she felt she was loosened up to point of her not feeling awful. She did start taking tart cherry extract 1200 and 500 turmeric for inflammation per Dr. SThompson Cauladvice.   DMII: at last visit we added rybelsus to help with sugar control and hopefully weight loss.    Allergies  Allergen Reactions   Amoxicillin Nausea Only    Yeast Infection Has patient had a PCN reaction causing immediate rash, facial/tongue/throat swelling, SOB or lightheadedness with hypotension: no Has patient had a PCN reaction causing severe rash involving mucus membranes or skin  necrosis: no Has patient had a PCN reaction that required hospitalization yes Has patient had a PCN reaction occurring within the last 10 years: yes If all of the above answers are "NO", then may proceed with Cephalosporin use.  Yeast Infection Has patient had a PCN reaction causing immediate rash, facial/tongue/throat swelling, SOB or lightheadedness with hypotension: no Has patient had a PCN reaction causing severe rash involving mucus membranes or skin necrosis: no Has patient had a PCN reaction that required hospitalization yes Has patient had a PCN reaction occurring within the last 10 years: yes If all of the above answers are "NO", then may proceed with Cephalosporin use.   Eggs Or Egg-Derived Products    Morphine Other (See Comments)    REACTION: hyper REACTION: hyper   Latex Rash   Thiethylperazine Other (See Comments) and Palpitations   Current Meds  Medication Sig   alendronate (FOSAMAX) 70 MG tablet TAKE 1 TABLET (70 MG TOTAL) BY MOUTH EVERY 7 (SEVEN) DAYS.   amphetamine-dextroamphetamine (ADDERALL XR) 10 MG 24 hr capsule Take 1 capsule (10 mg total) by mouth daily.   buPROPion (WELLBUTRIN XL) 150 MG 24 hr tablet TAKE 2 TABLETS BY MOUTH DAILY.   cetirizine (ZYRTEC) 10 MG tablet Take 10 mg by mouth at bedtime.   Cholecalciferol (VITAMIN D3 PO) Take 1,000 Units by mouth daily.   diazepam (VALIUM) 5 MG tablet TAKE 1 TABLET BY MOUTH EVERY 12 HOURS AS NEEDED FOR ANXIETY OR MUSCLE SPASMS   fluconazole (DIFLUCAN) 150 MG tablet fluconazole 150 mg tablet  TAKE 1 TABLET (150 MG TOTAL)  BY MOUTH ONCE FOR 1 DOSE. REPEAT IN 72 HOURS IF SYMPTOMS STILL PRESENT.   FLUoxetine (PROZAC) 10 MG tablet Take 1 tablet (10 mg total) by mouth daily.   fluticasone (FLONASE) 50 MCG/ACT nasal spray Place 2 sprays into both nostrils daily.   Fluticasone Propionate 0.05 % LOTN fluticasone 0.05 % lotion-emollient combo no.65 cream, topical kit   hydrochlorothiazide (HYDRODIURIL) 25 MG tablet TAKE 1 TABLET  BY MOUTH EVERY DAY   ibuprofen (ADVIL) 200 MG tablet Take 800 mg by mouth as needed.   ketoconazole (NIZORAL) 2 % cream Apply topically.   losartan (COZAAR) 50 MG tablet TAKE 1 TABLET BY MOUTH EVERY DAY   Multiple Vitamins-Minerals (EMERGEN-C IMMUNE PO) Take 1 tablet by mouth daily. With zinc   Oxycodone HCl 10 MG TABS Take 10 mg by mouth 5 (five) times daily as needed.   rosuvastatin (CRESTOR) 10 MG tablet Take 1 tablet (10 mg total) by mouth daily.   Semaglutide (RYBELSUS) 7 MG TABS Take 7 mg by mouth daily.   tiZANidine (ZANAFLEX) 2 MG tablet Take by mouth every 6 (six) hours as needed for muscle spasms.   tiZANidine (ZANAFLEX) 4 MG tablet Take 4 mg by mouth at bedtime.   Current Facility-Administered Medications for the 06/03/21 encounter (Video Visit) with Caren Macadam, MD  Medication   0.9 %  sodium chloride infusion    Review of Systems  Constitutional:  Negative for chills, fatigue and fever.  Respiratory:  Negative for cough, chest tightness, shortness of breath and wheezing.   Cardiovascular:  Negative for chest pain, palpitations and leg swelling.  Musculoskeletal:  Positive for back pain and neck stiffness.  Psychiatric/Behavioral:  Sleep disturbance: sleep is better with hot flashes controlled.    Objective:  Ht _0  (1.626 m)   Wt 224 lb (101.6 kg)   LMP 09/27/2016 (Approximate)   BMI 38.45 kg/m   Weight: 224 lb (101.6 kg)   BP Readings from Last 3 Encounters:  05/19/21 112/80  05/17/21 122/82  04/25/21 112/80   Wt Readings from Last 3 Encounters:  06/03/21 224 lb (101.6 kg)  05/19/21 224 lb (101.6 kg)  05/17/21 224 lb 3.2 oz (101.7 kg)    EXAM:  GENERAL: alert, oriented, appears well and in no acute distress  HEENT: atraumatic, conjunctiva clear, no obvious abnormalities on inspection of external nose and ears  NECK: normal movements of the head and neck  LUNGS: on inspection no signs of respiratory distress, breathing rate appears normal, no  obvious gross SOB, gasping or wheezing  CV: no obvious cyanosis  MS: moves all visible extremities without noticeable abnormality  PSYCH/NEURO: pleasant and cooperative, no obvious depression or anxiety, speech and thought processing grossly intact  Assessment/Plan  1. Controlled type 2 diabetes mellitus with hyperglycemia, without long-term current use of insulin (HCC) We will move to 53m dosing since she has tolerated the 312mdosing well. We discussed continuing to work on healthy eating and to work on adding in small amounts of activity. Number for sagewell given - may do well with aquatic exercise and planned exercise routine.  - Semaglutide (RYBELSUS) 7 MG TABS; Take 7 mg by mouth daily.  Dispense: 90 tablet; Refill: 1  2. Hot flashes Prozac has helped her tremendously and made hot flashes completely bearable.   3. Depression, major, single episode, moderate (HCC) Mood is doing much better. Feels that it would improve with pain control. We dicussed her following up for spinal injection today, which is painful for her,  but helps with mobility.   4. Class 2 severe obesity with serious comorbidity and body mass index (BMI) of 37.0 to 37.9 in adult, unspecified obesity type ([place]) See above. Working on exercise, diet, portion control.  - Semaglutide (RYBELSUS) 7 MG TABS; Take 7 mg by mouth daily.  Dispense: 90 tablet; Refill: 1  Return in about 3 months (around 09/03/2021) for Chronic condition visit.    I discussed the assessment and treatment plan with the patient. The patient was provided an opportunity to ask questions and all were answered. The patient agreed with the plan and demonstrated an understanding of the instructions.   The patient was advised to call back or seek an in-person evaluation if the symptoms worsen or if the condition fails to improve as anticipated.  I provided 30 minutes of non-face-to-face time during this encounter.   Micheline Rough, MD

## 2021-06-08 ENCOUNTER — Encounter: Payer: Self-pay | Admitting: Family Medicine

## 2021-06-08 DIAGNOSIS — R232 Flushing: Secondary | ICD-10-CM

## 2021-06-08 DIAGNOSIS — F321 Major depressive disorder, single episode, moderate: Secondary | ICD-10-CM

## 2021-06-09 ENCOUNTER — Other Ambulatory Visit: Payer: Self-pay | Admitting: Family Medicine

## 2021-06-14 ENCOUNTER — Telehealth: Payer: Self-pay | Admitting: *Deleted

## 2021-06-14 NOTE — Telephone Encounter (Signed)
-----   Message from Caren Macadam, MD sent at 06/03/2021 11:53 AM EDT ----- Please set up [place] in 3 months time

## 2021-06-14 NOTE — Telephone Encounter (Signed)
It looks like the pt has an appt scheduled previously for 9/21.

## 2021-06-17 MED ORDER — FLUOXETINE HCL 20 MG PO TABS: 20.0000 mg | ORAL_TABLET | Freq: Every day | ORAL | 1 refills | Status: DC

## 2021-07-06 ENCOUNTER — Ambulatory Visit: Payer: 59 | Admitting: Family Medicine

## 2021-07-06 ENCOUNTER — Encounter: Payer: Self-pay | Admitting: Family Medicine

## 2021-07-06 ENCOUNTER — Other Ambulatory Visit: Payer: Self-pay

## 2021-07-06 VITALS — BP 108/76 | Ht 64.0 in | Wt 225.8 lb

## 2021-07-06 DIAGNOSIS — M9904 Segmental and somatic dysfunction of sacral region: Secondary | ICD-10-CM | POA: Diagnosis not present

## 2021-07-06 DIAGNOSIS — M5136 Other intervertebral disc degeneration, lumbar region: Secondary | ICD-10-CM

## 2021-07-06 DIAGNOSIS — M542 Cervicalgia: Secondary | ICD-10-CM | POA: Diagnosis not present

## 2021-07-06 DIAGNOSIS — M9901 Segmental and somatic dysfunction of cervical region: Secondary | ICD-10-CM

## 2021-07-06 DIAGNOSIS — M9903 Segmental and somatic dysfunction of lumbar region: Secondary | ICD-10-CM | POA: Diagnosis not present

## 2021-07-06 DIAGNOSIS — M5030 Other cervical disc degeneration, unspecified cervical region: Secondary | ICD-10-CM

## 2021-07-06 DIAGNOSIS — M9908 Segmental and somatic dysfunction of rib cage: Secondary | ICD-10-CM | POA: Diagnosis not present

## 2021-07-06 DIAGNOSIS — M9902 Segmental and somatic dysfunction of thoracic region: Secondary | ICD-10-CM

## 2021-07-06 NOTE — Assessment & Plan Note (Addendum)
significant arthritic changes noted of the lumbar spine noted.  There is no significant discomfort noted in the lower back.  Responded fairly well to an FPR technique at the L5-S1 area.  Once again avoiding any type of high impact.  Patient hopefully will make some progress.  Follow-up again in 6 to 8 weeks

## 2021-07-06 NOTE — Assessment & Plan Note (Signed)
Significant arthritic changes noted.  Patient does have increase in intensity of pain with certain hand.  Likely no radicular symptoms.  He has multiple different comorbidities this is making treatment difficult.  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  Follow-up again 5 to 6 weeks

## 2021-07-06 NOTE — Patient Instructions (Addendum)
Good to see you  I've referred you to Physical Therapy at [facility].  Let us know if you don't hear from them in one week. Try to schedule the visits with Lauren 1-2 days after your appointments with me  Zanaflex next 3 nights  See me again in 5-6 weeks right before vacation

## 2021-07-06 NOTE — Progress Notes (Signed)
[place] Phone: [phone] Subjective:    I'm seeing this patient by the request  of:  Caren Macadam, MD  I, Peterson Lombard, LAT, ATC acting as a scribe for [employer], DO.  CC: Neck and back pain follow-up  [id number]  Lori [name] is a 55 y.o. female coming in with complaint of back and neck pain. OMT 05/19/2021. Patient states both neck a LB are pretty tight today. Pt reports she had a massage the day after her last visit and had some relief/loosening and had some trigger point work. Pt is wondering if PT referral is possible.  Medications patient has been prescribed: None  Taking:         Reviewed prior external information including notes and imaging from previsou exam, outside providers and external EMR if available.  Patient did have x-rays at last exam.  Patient's cervical x-rays does show postsurgical changes with moderate degenerative disc disease at C4-C5 which is adjacent segment disease to the anterior fusion from C5-T1  Patient's lumbar x-rays do show moderate to severe multilevel degenerative changes mostly from L2-S1  As well as notes that were available from care everywhere and other healthcare systems.  Past medical history, social, surgical and family history all reviewed in electronic medical record.  No pertanent information unless stated regarding to the chief complaint.   Past Medical History:  Diagnosis Date   ALLERGIC RHINITIS 03/14/2010   BACK PAIN 01/24/2010   Buttock pain    Constipation    Diabetes ([place])    Fatty liver    GERD 12/01/2008   HBP (high blood pressure)    HIATAL HERNIA 05/27/2010   Hot flashes    Hyperlipidemia    Hypertrophy of tongue papillae 03/14/2010   IBS (irritable bowel syndrome)    Joint pain    Numbness and tingling of left upper and lower extremity    OSTEOARTHRITIS 12/01/2008   Osteoporosis 05/17/2020   Fragility fracture plus T score  -1.8 on DEXA scan June 2021   Sciatica, left side    Sleep apnea    Sleep apnea    Thigh pain    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   TOBACCO USE 12/01/2008   VITAMIN D DEFICIENCY 05/23/2010    Allergies  Allergen Reactions   Amoxicillin Nausea Only    Yeast Infection Has patient had a PCN reaction causing immediate rash, facial/tongue/throat swelling, SOB or lightheadedness with hypotension: no Has patient had a PCN reaction causing severe rash involving mucus membranes or skin necrosis: no Has patient had a PCN reaction that required hospitalization yes Has patient had a PCN reaction occurring within the last 10 years: yes If all of the above answers are "NO", then may proceed with Cephalosporin use.  Yeast Infection Has patient had a PCN reaction causing immediate rash, facial/tongue/throat swelling, SOB or lightheadedness with hypotension: no Has patient had a PCN reaction causing severe rash involving mucus membranes or skin necrosis: no Has patient had a PCN reaction that required hospitalization yes Has patient had a PCN reaction occurring within the last 10 years: yes If all of the above answers are "NO", then may proceed with Cephalosporin use.   Eggs Or Egg-Derived Products    Morphine Other (See Comments)    REACTION: hyper REACTION: hyper   Latex Rash   Thiethylperazine Other (See Comments) and Palpitations     Review of Systems:  No headache, visual changes, nausea, vomiting, diarrhea,  constipation, dizziness, abdominal pain, skin rash, fevers, chills, night sweats, weight loss, swollen lymph nodes, body aches, joint swelling, chest pain, shortness of breath, mood changes. POSITIVE muscle aches  Objective  Blood pressure 108/76, height '5\' 4"'$  (1.626 m), weight 225 lb 12.8 oz (102.4 kg), last menstrual period 09/27/2016.   General: No apparent distress alert and oriented x3 mood and affect normal, dressed appropriately.  Overweight HEENT: Pupils equal, extraocular movements  intact  Respiratory: Patient's speak in full sentences and does not appear short of breath  Cardiovascular: No lower extremity edema, non tender, no erythema  Poor core strength.  Patient does have tenderness diffusely of the paraspinal musculature from the cervical spine down to the sacrum.  Patient does have tightness with straight leg test bilaterally.  Osteopathic findings  C4 flexed rotated and side bent right  T3 extended rotated and side bent right inhaled rib T8 extended rotated and side bent left L5 flexed rotated and side bent left Sacrum right on right       Assessment and Plan:  Degenerative disc disease, cervical Significant arthritic changes noted.  Patient does have increase in intensity of pain with certain hand.  Likely no radicular symptoms.  He has multiple different comorbidities this is making treatment difficult.  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  Follow-up again 5 to 6 weeks  Lumbar arthritis significant arthritic changes noted of the lumbar spine noted.  There is no significant discomfort noted in the lower back.  Responded fairly well to an FPR technique at the L5-S1 area.  Once again avoiding any type of high impact.  Patient hopefully will make some progress.  Follow-up again in 6 to 8 weeks   Nonallopathic problems  Decision today to treat with OMT was based on Physical Exam  After verbal consent patient was treated with , ME, FPR techniques in cervical, rib, thoracic, lumbar, and sacral  areas with patient surgical changes did not do any more muscle energy.  Patient tolerated the procedure well with improvement in symptoms  Patient given exercises, stretches and lifestyle modifications  See medications in patient instructions if given  Patient will follow up in 4-8 weeks      The above documentation has been reviewed and is accurate and complete Lyndal Pulley, DO        Note: This dictation was prepared with Dragon dictation along with smaller phrase technology. Any transcriptional errors that result from this process are unintentional.

## 2021-07-20 ENCOUNTER — Other Ambulatory Visit (INDEPENDENT_AMBULATORY_CARE_PROVIDER_SITE_OTHER): Payer: Self-pay | Admitting: Bariatrics

## 2021-07-20 DIAGNOSIS — E559 Vitamin D deficiency, unspecified: Secondary | ICD-10-CM

## 2021-07-20 NOTE — Telephone Encounter (Signed)
Last OV with Dr Brown 

## 2021-07-29 ENCOUNTER — Encounter: Payer: Self-pay | Admitting: Family Medicine

## 2021-08-05 ENCOUNTER — Encounter: Payer: Self-pay | Admitting: Family Medicine

## 2021-08-05 ENCOUNTER — Other Ambulatory Visit: Payer: Self-pay | Admitting: Family Medicine

## 2021-08-05 DIAGNOSIS — I10 Essential (primary) hypertension: Secondary | ICD-10-CM

## 2021-08-08 ENCOUNTER — Ambulatory Visit: Payer: 59 | Admitting: Physical Therapy

## 2021-08-08 ENCOUNTER — Other Ambulatory Visit: Payer: Self-pay

## 2021-08-08 DIAGNOSIS — M545 Low back pain, unspecified: Secondary | ICD-10-CM

## 2021-08-08 DIAGNOSIS — M542 Cervicalgia: Secondary | ICD-10-CM | POA: Diagnosis not present

## 2021-08-08 DIAGNOSIS — G8929 Other chronic pain: Secondary | ICD-10-CM | POA: Diagnosis not present

## 2021-08-08 MED ORDER — HYDROCHLOROTHIAZIDE 25 MG PO TABS: 25.0000 mg | ORAL_TABLET | Freq: Every day | ORAL | 0 refills | Status: DC

## 2021-08-08 NOTE — Patient Instructions (Signed)
Access Code: [email or web address] URL: [email or web address] Date: 08/08/2021 Prepared by: Lyndee Hensen  Exercises Supine Posterior Pelvic Tilt - 2 x daily - 2 sets - 10 reps Single Knee to Chest Stretch - 2 x daily - 3 reps - 30 hold Supine Lower Trunk Rotation - 2 x daily - 10 reps - 5 hold Seated Cervical Sidebending Stretch - 2 x daily - 3 reps - 30 hold

## 2021-08-10 ENCOUNTER — Encounter: Payer: Self-pay | Admitting: Physical Therapy
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
## 2021-08-14 MED ORDER — KETOCONAZOLE 2 % EX CREA: TOPICAL_CREAM | Freq: Two times a day (BID) | CUTANEOUS | 2 refills | Status: DC | PRN

## 2021-08-14 MED ORDER — FLUCONAZOLE 150 MG PO TABS: ORAL_TABLET | ORAL | 0 refills | Status: DC

## 2021-08-16 NOTE — Progress Notes (Signed)
[place] Phone: [phone] Subjective:   Lori [name], am serving as a scribe for Dr. Hulan Saas.  This visit occurred during the SARS-CoV-2 public health emergency.  Safety protocols were in place, including screening questions prior to the visit, additional usage of staff PPE, and extensive cleaning of exam room while observing appropriate contact time as indicated for disinfecting solutions.   I'm seeing this patient by the request  of:  Koberlein, Steele Berg, MD  CC: back and neck pain   [id number]  Lori [name] is a 55 y.o. female coming in with complaint of back and neck pain. OMT on 07/06/2021. Patient states that she has had increase in headaches. Started dry needling with physical therapist which has helped to alleviate her pain. Notes swollen glands under jaw which is making R temple sore.   Patient is having B hip pain. States that it is sciatic nerve irritation. Going to have epidural on October 4th through pain management. Patient also had GT injection last year which is now wearing off.   R hip pain is more in the groin. History of pubic symphysis fx. Has to alter gait due to pain and swings her R leg out.   Also was recently diagnosed with lateral epicondylitis of both elbows.   Using volatren gel for B knee pain which is helping. Dr. [name] provided patient with injections which has helped her pain up until now.   Medications patient has been prescribed: None  Taking:  Xrays show lumbar DDD moderate to severe  Cervical with postsurgical changes and moderate arthritic changes      Reviewed prior external information including notes and imaging from previsou exam, outside providers and external EMR if available.   As well as notes that were available from care everywhere and other healthcare systems.  Past medical history, social, surgical and family history all reviewed in electronic  medical record.  [name] pertanent information unless stated regarding to the chief complaint.   Past Medical History:  Diagnosis Date   ALLERGIC RHINITIS 03/14/2010   BACK PAIN 01/24/2010   Buttock pain    Constipation    Diabetes ([place])    Fatty liver    GERD 12/01/2008   HBP (high blood pressure)    HIATAL HERNIA 05/27/2010   Hot flashes    Hyperlipidemia    Hypertrophy of tongue papillae 03/14/2010   IBS (irritable bowel syndrome)    Joint pain    Numbness and tingling of left upper and lower extremity    OSTEOARTHRITIS 12/01/2008   Osteoporosis 05/17/2020   Fragility fracture plus T score -1.8 on DEXA scan June 2021   Sciatica, left side    Sleep apnea    Sleep apnea    Thigh pain    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   TOBACCO USE 12/01/2008   VITAMIN D DEFICIENCY 05/23/2010    Allergies  Allergen Reactions   Amoxicillin Nausea Only    Yeast Infection Has patient had a PCN reaction causing immediate rash, facial/tongue/throat swelling, SOB or lightheadedness with hypotension: [name] Has patient had a PCN reaction causing severe rash involving mucus membranes or skin necrosis: [name] Has patient had a PCN reaction that required hospitalization yes Has patient had a PCN reaction occurring within the last 10 years: yes If all of the above answers are "[name]", then may proceed with Cephalosporin use.  Yeast Infection Has patient had a PCN reaction causing immediate rash, facial/tongue/throat  swelling, SOB or lightheadedness with hypotension: [name] Has patient had a PCN reaction causing severe rash involving mucus membranes or skin necrosis: [name] Has patient had a PCN reaction that required hospitalization yes Has patient had a PCN reaction occurring within the last 10 years: yes If all of the above answers are "[name]", then may proceed with Cephalosporin use.   Eggs Or Egg-Derived Products    Morphine Other (See Comments)    REACTION: hyper REACTION: hyper   Latex Rash   Thiethylperazine Other (See Comments)  and Palpitations     Review of Systems:  [name] headache, visual changes, nausea, vomiting, diarrhea, constipation, dizziness, abdominal pain, skin rash, fevers, chills, night sweats, weight loss, swollen lymph nodes, body aches, joint swelling, chest pain, shortness of breath, mood changes. POSITIVE muscle aches  Objective  Blood pressure 102/64, height '5\' 4"'$  (1.626 m), weight 228 lb (103.4 kg), last menstrual period 09/27/2016.   General: [name] apparent distress alert and oriented x3 mood and affect normal, dressed appropriately.  HEENT: Pupils equal, extraocular movements intact  Respiratory: Patient's speak in full sentences and does not appear short of breath  Cardiovascular: [name] lower extremity edema, non tender, [name] erythema  Neck exam does have some loss of lordosis and some tenderness to palpation in the paraspinal musculature.  Negative Spurling's.  Significant tightness noted in the paraspinal musculature of the cervical spine as well.  Bilateral knees do have some mild arthritic changes noted.  Mild crepitus noted.  Lateral tracking of the patella right greater than left.  Osteopathic findings  C6 flexed rotated and side bent left T3 extended rotated and side bent right inhaled rib T9 extended rotated and side bent left L2 flexed rotated and side bent right Sacrum right on right       Assessment and Plan:  Degenerative disc disease, cervical Postsurgical changes noted as well as significant arthritic changes noted.  Discussed with patient about icing regimen and home exercises.  Attempted osteopathic manipulation again.  See how patient responds.  Discussed different medications.  Patient is already on chronic narcotics from another provider.  Discussed other things such as potential epidurals.  Patient will consider all the other things and please read AVS.  Follow-up again in 6 to 8 weeks   Nonallopathic problems  Decision today to treat with OMT was based on Physical  Exam  After verbal consent patient was treated with HVLA, ME, FPR techniques in cervical, rib, thoracic, lumbar, and sacral  areas  Patient tolerated the procedure well with improvement in symptoms  Patient given exercises, stretches and lifestyle modifications  See medications in patient instructions if given  Patient will follow up in 4-8 weeks      The above documentation has been reviewed and is accurate and complete Lori Pulley, DO        Note: This dictation was prepared with Dragon dictation along with smaller phrase technology. Any transcriptional errors that result from this process are unintentional.

## 2021-08-18 ENCOUNTER — Ambulatory Visit (INDEPENDENT_AMBULATORY_CARE_PROVIDER_SITE_OTHER): Payer: 59

## 2021-08-18 ENCOUNTER — Other Ambulatory Visit: Payer: Self-pay

## 2021-08-18 ENCOUNTER — Ambulatory Visit: Payer: 59 | Admitting: Family Medicine

## 2021-08-18 VITALS — BP 102/64 | Ht 64.0 in | Wt 228.0 lb

## 2021-08-18 DIAGNOSIS — M9904 Segmental and somatic dysfunction of sacral region: Secondary | ICD-10-CM

## 2021-08-18 DIAGNOSIS — M25561 Pain in right knee: Secondary | ICD-10-CM | POA: Diagnosis not present

## 2021-08-18 DIAGNOSIS — M9903 Segmental and somatic dysfunction of lumbar region: Secondary | ICD-10-CM

## 2021-08-18 DIAGNOSIS — M9902 Segmental and somatic dysfunction of thoracic region: Secondary | ICD-10-CM

## 2021-08-18 DIAGNOSIS — M9901 Segmental and somatic dysfunction of cervical region: Secondary | ICD-10-CM

## 2021-08-18 DIAGNOSIS — M5030 Other cervical disc degeneration, unspecified cervical region: Secondary | ICD-10-CM | POA: Diagnosis not present

## 2021-08-18 DIAGNOSIS — M9908 Segmental and somatic dysfunction of rib cage: Secondary | ICD-10-CM

## 2021-08-18 DIAGNOSIS — M25562 Pain in left knee: Secondary | ICD-10-CM

## 2021-08-18 NOTE — Patient Instructions (Signed)
Good to see you Have fun in the [place] Keep doing what you're doing See you again in 6 weeks

## 2021-08-19 ENCOUNTER — Encounter: Payer: Self-pay | Admitting: Family Medicine

## 2021-08-19 NOTE — Assessment & Plan Note (Signed)
Postsurgical changes noted as well as significant arthritic changes noted.  Discussed with patient about icing regimen and home exercises.  Attempted osteopathic manipulation again.  See how patient responds.  Discussed different medications.  Patient is already on chronic narcotics from another provider.  Discussed other things such as potential epidurals.  Patient will consider all the other things and please read AVS.  Follow-up again in 6 to 8 weeks

## 2021-08-27 ENCOUNTER — Other Ambulatory Visit: Payer: Self-pay | Admitting: Family Medicine

## 2021-08-31 ENCOUNTER — Ambulatory Visit: Payer: 59 | Admitting: Family Medicine

## 2021-09-05 ENCOUNTER — Ambulatory Visit: Payer: 59 | Admitting: Physical Therapy

## 2021-09-05 ENCOUNTER — Other Ambulatory Visit: Payer: Self-pay

## 2021-09-05 DIAGNOSIS — M545 Low back pain, unspecified: Secondary | ICD-10-CM | POA: Diagnosis not present

## 2021-09-05 DIAGNOSIS — M542 Cervicalgia: Secondary | ICD-10-CM | POA: Diagnosis not present

## 2021-09-05 DIAGNOSIS — G8929 Other chronic pain: Secondary | ICD-10-CM | POA: Diagnosis not present

## 2021-09-06 ENCOUNTER — Encounter: Payer: Self-pay | Admitting: Family Medicine

## 2021-09-07 ENCOUNTER — Other Ambulatory Visit: Payer: Self-pay

## 2021-09-07 ENCOUNTER — Ambulatory Visit: Payer: 59 | Admitting: Family Medicine

## 2021-09-07 ENCOUNTER — Encounter: Payer: Self-pay | Admitting: Family Medicine

## 2021-09-07 ENCOUNTER — Encounter: Payer: Self-pay | Admitting: Physical Therapy

## 2021-09-07 VITALS — BP 102/64 | HR 84 | Temp 98.4°F | Ht 64.0 in | Wt 228.6 lb

## 2021-09-07 DIAGNOSIS — E1169 Type 2 diabetes mellitus with other specified complication: Secondary | ICD-10-CM | POA: Diagnosis not present

## 2021-09-07 DIAGNOSIS — M2550 Pain in unspecified joint: Secondary | ICD-10-CM

## 2021-09-07 DIAGNOSIS — E785 Hyperlipidemia, unspecified: Secondary | ICD-10-CM | POA: Diagnosis not present

## 2021-09-07 DIAGNOSIS — E1165 Type 2 diabetes mellitus with hyperglycemia: Secondary | ICD-10-CM | POA: Diagnosis not present

## 2021-09-07 DIAGNOSIS — R232 Flushing: Secondary | ICD-10-CM

## 2021-09-07 DIAGNOSIS — B372 Candidiasis of skin and nail: Secondary | ICD-10-CM | POA: Diagnosis not present

## 2021-09-07 DIAGNOSIS — E559 Vitamin D deficiency, unspecified: Secondary | ICD-10-CM

## 2021-09-07 DIAGNOSIS — R5383 Other fatigue: Secondary | ICD-10-CM

## 2021-09-07 DIAGNOSIS — I10 Essential (primary) hypertension: Secondary | ICD-10-CM | POA: Diagnosis not present

## 2021-09-07 MED ORDER — RYBELSUS 3 MG PO TABS: 3.0000 mg | ORAL_TABLET | Freq: Every day | ORAL | 0 refills | Status: DC

## 2021-09-07 MED ORDER — FLUOXETINE HCL 40 MG PO CAPS: 40.0000 mg | ORAL_CAPSULE | Freq: Every day | ORAL | 1 refills | Status: DC

## 2021-09-07 NOTE — Patient Instructions (Signed)
Citrucel daily (or metamucil)

## 2021-09-07 NOTE — Progress Notes (Signed)
Lori [name] DOB: 1966/09/24 Encounter date: 09/07/2021  This is a 55 y.o. female who presents with Chief Complaint  Patient presents with   Follow-up    History of present illness:  She has seen Dr. Tamala Julian a couple of times, has had some massages - 2 hours from neck to glutes focusing on left side. Worked a little with PT and had some dry needling shoulder/neck and she said it hurt and swelled up after treatment. Had lower back dry needling yesterday which helped a little, but is quite sore. She does try to do recommended exercises. Has been using voltaren on knees, back. Down in right groin and around to back just hurts all the time. Gets sciatic injection next week.   She got a little nauseated with the rybelsus because not taking it as directed. Wasn't doing it in the morning on empty stomach. Ended up getting scared to restart the rybelsus.   Thinks she has yeast infection again - white tongue and then irritated under breast, abd fold, ears smell. Not as much vaginal. Also craves sugar.   Prozac doesn't feel like it is helping as much with the sweating.   Uses cpap with nasal pillows and has tried to use device to help close jaw. Wondering about dental appliance.   Went for eval for hearing - needs hearing aids. This should be covered through work; so she is working on this with them.     Allergies  Allergen Reactions   Amoxicillin Nausea Only    Yeast Infection Has patient had a PCN reaction causing immediate rash, facial/tongue/throat swelling, SOB or lightheadedness with hypotension: no Has patient had a PCN reaction causing severe rash involving mucus membranes or skin necrosis: no Has patient had a PCN reaction that required hospitalization yes Has patient had a PCN reaction occurring within the last 10 years: yes If all of the above answers are "NO", then may proceed with Cephalosporin use.  Yeast Infection Has patient had a PCN reaction causing immediate rash,  facial/tongue/throat swelling, SOB or lightheadedness with hypotension: no Has patient had a PCN reaction causing severe rash involving mucus membranes or skin necrosis: no Has patient had a PCN reaction that required hospitalization yes Has patient had a PCN reaction occurring within the last 10 years: yes If all of the above answers are "NO", then may proceed with Cephalosporin use.   Eggs Or Egg-Derived Products    Morphine Other (See Comments)    REACTION: hyper REACTION: hyper   Latex Rash   Thiethylperazine Other (See Comments) and Palpitations   Current Meds  Medication Sig   cetirizine (ZYRTEC) 10 MG tablet Take 10 mg by mouth at bedtime.   Cholecalciferol (VITAMIN D3 PO) Take 1,000 Units by mouth daily.   diazepam (VALIUM) 5 MG tablet TAKE 1 TABLET BY MOUTH EVERY 12 HOURS AS NEEDED FOR ANXIETY OR MUSCLE SPASMS   FLUoxetine (PROZAC) 20 MG tablet Take 1 tablet (20 mg total) by mouth daily.   fluticasone (FLONASE) 50 MCG/ACT nasal spray Place 2 sprays into both nostrils daily.   hydrochlorothiazide (HYDRODIURIL) 25 MG tablet Take 1 tablet (25 mg total) by mouth daily.   ibuprofen (ADVIL) 200 MG tablet Take 800 mg by mouth as needed.   ketoconazole (NIZORAL) 2 % cream Apply topically 2 (two) times daily as needed for irritation.   losartan (COZAAR) 50 MG tablet TAKE 1 TABLET BY MOUTH EVERY DAY   Multiple Vitamins-Minerals (EMERGEN-C IMMUNE PO) Take 1 tablet by mouth daily.  With zinc   oxyCODONE-acetaminophen (PERCOCET) 10-325 MG tablet Take 1 tablet by mouth 5 (five) times daily as needed.   rOPINIRole (REQUIP) 0.25 MG tablet Take 0.25 mg by mouth at bedtime.   rosuvastatin (CRESTOR) 10 MG tablet TAKE 1 TABLET BY MOUTH EVERY DAY   tiZANidine (ZANAFLEX) 4 MG tablet Take 4 mg by mouth at bedtime.   Current Facility-Administered Medications for the 09/07/21 encounter (Office Visit) with Caren Macadam, MD  Medication   0.9 %  sodium chloride infusion    Review of Systems   Constitutional:  Positive for fatigue. Negative for chills and fever.  Respiratory:  Negative for cough, chest tightness, shortness of breath and wheezing.   Cardiovascular:  Negative for chest pain, palpitations and leg swelling.  Musculoskeletal:  Positive for arthralgias (hips, knees, hands, feet), back pain, joint swelling (hands), neck pain and neck stiffness.  Psychiatric/Behavioral:  Positive for sleep disturbance (due to hot flashes, aches/pains). Negative for suicidal ideas.    Objective:  LMP 09/27/2016 (Approximate)       BP Readings from Last 3 Encounters:  08/18/21 102/64  07/06/21 108/76  05/19/21 112/80   Wt Readings from Last 3 Encounters:  08/18/21 228 lb (103.4 kg)  07/06/21 225 lb 12.8 oz (102.4 kg)  06/03/21 224 lb (101.6 kg)    Physical Exam Constitutional:      General: She is not in acute distress.    Appearance: She is well-developed.  HENT:     Head: Normocephalic and atraumatic.  Neck:     Comments: Significant spasm trap bilat Cardiovascular:     Rate and Rhythm: Normal rate and regular rhythm.     Heart sounds: Normal heart sounds. No murmur heard. Pulmonary:     Effort: Pulmonary effort is normal.     Breath sounds: Normal breath sounds.  Abdominal:     General: Bowel sounds are normal. There is no distension.     Palpations: Abdomen is soft.     Tenderness: There is no abdominal tenderness. There is no guarding.  Feet:     Comments: Normal bilateral monofilament foot exam.  Mild heel callus.  Skin is intact. Skin:    General: Skin is warm and dry.     Comments: Sensory exam of the foot is normal, tested with the monofilament. Good pulses, no lesions or ulcers, good peripheral pulses.  Psychiatric:        Judgment: Judgment normal.    Assessment/Plan  1. Fatigue, unspecified type We will check some additional blood work today due to fatigue.  I suspect some of her fatigue is related to chronic pain, interference that this has on her  sleep, hot flashes. - Vitamin B12; Future - Folate; Future - Folate - Vitamin B12  2. Yeast dermatitis We discussed that having better blood sugar control will help control yeast as well.  She has done well with Diflucan in the past.  We will treat for slightly longer course since she does have more extensive skin irritation and vaginal irritation from yeast.  3. Vitamin D deficiency She is taking 1000 units daily.  Recheck baseline levels. - VITAMIN D 25 Hydroxy (Vit-D Deficiency, Fractures); Future - VITAMIN D 25 Hydroxy (Vit-D Deficiency, Fractures)  4. Hyperlipidemia associated with type 2 diabetes mellitus ([place]) Continue with Crestor 10 mg.  Recheck labs today and adjust medication if needed. - Lipid panel; Future - TSH; Future - TSH - Lipid panel  5. Controlled type 2 diabetes mellitus with hyperglycemia, without long-term  current use of insulin ([place]) We will start back on 3 mg Rybelsus.  She understands to take medicine correctly now.  If she is tolerating this after 1 month, will increase to 7 mg. - Semaglutide (RYBELSUS) 3 MG TABS; Take 3 mg by mouth daily.  Dispense: 30 tablet; Refill: 0 - Hemoglobin A1c; Future - Hemoglobin A1c - HM DIABETES FOOT EXAM  6. Primary hypertension Blood pressure well controlled today.  Continue with losartan and hydrochlorothiazide.  Encouraged her to check at home. - Comprehensive metabolic panel; Future - CBC with Differential/Platelet; Future - TSH; Future - TSH - CBC with Differential/Platelet - Comprehensive metabolic panel  7. Hot flashes She has stopped the Wellbutrin (because she ran out) we will increase Prozac to 40 mg daily.  She did have some significant improvement when Prozac was started with her hot flashes. - FLUoxetine (PROZAC) 40 MG capsule; Take 1 capsule (40 mg total) by mouth daily.  Dispense: 90 capsule; Refill: 1 - TSH; Future - TSH  8. Arthralgia, unspecified joint She has not had blood work significant for  autoimmune disease in the past, but she continues to have increasing joint pain and would like to do what ever she can to stay on top of this.  She does have a possible history of rheumatoid arthritis in her dad.  We will refer to rheumatology for further evaluation. - Ambulatory referral to Rheumatology; Future    Return for pending lab results. 50 minutes spent with patient and chart review, exam, discussion of follow-up plan, discussion of medications.    Micheline Rough, MD

## 2021-09-07 NOTE — Patient Instructions (Signed)
Access Code: [email or web address] URL: [email or web address] Date: 09/07/2021 Prepared by: Lyndee Hensen  Exercises Supine Posterior Pelvic Tilt - 2 x daily - 2 sets - 10 reps Single Knee to Chest Stretch - 2 x daily - 3 reps - 30 hold Supine Lower Trunk Rotation - 2 x daily - 10 reps - 5 hold Seated Cervical Sidebending Stretch - 2 x daily - 3 reps - 30 hold Supine March - 1 x daily - 2 sets - 10 reps Hooklying Clamshell with Resistance - 1 x daily - 2 sets - 10 reps Supine Bridge - 1 x daily - 2 sets - 10 reps

## 2021-09-07 NOTE — Therapy (Signed)
[facility] [place], [place], [address] Phone: [phone]   Fax:  [phone]  Physical Therapy Treatment  Patient Details  Name: Lori [name] MRN: [id number] Date of Birth: Jan 11, 1966 Referring Provider (PT): Charlann Boxer   Encounter Date: 09/05/2021   PT End of Session - 09/07/21 2243     Visit Number 2    Number of Visits 12    Date for PT Re-Evaluation 09/19/21    Authorization Type UHC    PT Start Time 3559    PT Stop Time 7416    PT Time Calculation (min) 40 min    Activity Tolerance Patient tolerated treatment well    Behavior During Therapy [facility] for tasks assessed/performed             Past Medical History:  Diagnosis Date   ALLERGIC RHINITIS 03/14/2010   BACK PAIN 01/24/2010   Buttock pain    Constipation    Diabetes ([place])    Fatty liver    GERD 12/01/2008   HBP (high blood pressure)    HIATAL HERNIA 05/27/2010   Hot flashes    Hyperlipidemia    Hypertrophy of tongue papillae 03/14/2010   IBS (irritable bowel syndrome)    Joint pain    Numbness and tingling of left upper and lower extremity    OSTEOARTHRITIS 12/01/2008   Osteoporosis 05/17/2020   Fragility fracture plus T score -1.8 on DEXA scan June 2021   Sciatica, left side    Sleep apnea    Sleep apnea    Thigh pain    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   TOBACCO USE 12/01/2008   VITAMIN D DEFICIENCY 05/23/2010    Past Surgical History:  Procedure Laterality Date   ABDOMINAL HYSTERECTOMY N/A 10/17/2016   Procedure: HYSTERECTOMY ABDOMINAL;  Surgeon: Sanjuana Kava, MD;  Location: [facility]; benign mass in uterus   APPENDECTOMY     BREAST SURGERY     lumpectomy   CERVICAL FUSION     x2; 2007, 2011   [place] N/A 10/17/2016   Procedure: CYSTOSCOPY;  Surgeon: Sanjuana Kava, MD;  Location: [facility];  Service: Gynecology;  Laterality: N/A;   DILATION AND CURETTAGE OF UTERUS     miscarriage   ESOPHAGEAL MANOMETRY      ESOPHAGOGASTRODUODENOSCOPY     GASTRIC FUNDOPLICATION     [place]   LAPAROSCOPIC [place] FUNDOPLICATION     LUMBAR DISC SURGERY     LUMBAR LAMINECTOMY  2005   SALPINGOOPHORECTOMY Bilateral 10/17/2016   Procedure: SALPINGO OOPHORECTOMY;  Surgeon: Sanjuana Kava, MD;  Location: [facility];  Service: Gynecology;  Laterality: Bilateral;   TONSILLECTOMY AND ADENOIDECTOMY     UPPER GASTROINTESTINAL ENDOSCOPY      There were no vitals filed for this visit.   Subjective Assessment - 09/07/21 2238     Subjective Pt states continued pain in hip, back, neck.    Currently in Pain? Yes    Pain Score 6     Pain Location Neck    Pain Orientation Left;Right    Pain Descriptors / Indicators Aching;Tightness    Pain Type Chronic pain    Pain Onset More than a month ago    Pain Frequency Intermittent    Pain Score 6    Pain Location Back    Pain Orientation Right;Left    Pain Descriptors / Indicators Aching    Pain Type Chronic pain    Pain Onset  More than a month ago    Pain Frequency Intermittent                               [facility] Adult PT Treatment/Exercise - 09/07/21 0001       Exercises   Exercises Lumbar;Neck      Neck Exercises: Stretches   Upper Trapezius Stretch 2 reps;30 seconds      Lumbar Exercises: Stretches   Single Knee to Chest Stretch 3 reps;30 seconds    Lower Trunk Rotation 5 reps;10 seconds    Pelvic Tilt 20 reps      Lumbar Exercises: Aerobic   Recumbent Bike L1 x 5 min;      Lumbar Exercises: Standing   Row 20 reps    Theraband Level (Row) Level 3 (Green)      Lumbar Exercises: Seated   Sit to Stand 10 reps      Lumbar Exercises: Supine   Bent Knee Raise 15 reps    Bent Knee Raise Limitations with TA    Bridge 10 reps    Other Supine Lumbar Exercises Clam Bl TB small ROM x 20; with TA      Manual Therapy   Manual Therapy Soft tissue mobilization    Soft tissue mobilization DTM/TPR to R glute                     PT  Education - 09/07/21 2243     Education Details updated HEP    Person(s) Educated Patient    Methods Explanation;Demonstration;Tactile cues;Handout;Verbal cues    Comprehension Verbalized understanding;Returned demonstration;Verbal cues required;Tactile cues required;Need further instruction              PT Short Term Goals - 08/10/21 1709       PT SHORT TERM GOAL #1   Title Pt to be independent with initial HEP for neck and back    Time 2    Period Weeks    Status New    Target Date 08/22/21               PT Long Term Goals - 08/10/21 1709       PT LONG TERM GOAL #1   Title Pt to be independent with final HEP for neck  and back    Time 6    Period Weeks    Status New    Target Date 09/19/21      PT LONG TERM GOAL #2   Title Pt to report decreased pain in neck and back to 0-2/10 with activity    Time 6    Period Weeks    Status New    Target Date 09/19/21      PT LONG TERM GOAL #3   Title Pt to report ability for at least 30 min of exercise without pain greater than 2/10, to improve ability for activity and weight loss.    Time 6    Period Weeks    Status New    Target Date 09/19/21      PT LONG TERM GOAL #4   Title Pt to demo improved ROM of lumbar spine to be WNL for pt age. , to imrpove ability for ADLS and IADLS.    Time 6    Period Weeks    Status New    Target Date 09/19/21  Plan - 09/07/21 2244     Clinical Impression Statement Focus on hip and core strength today, in neutral positions, to not increase pain in hip. Pt requires cuing for TA contraction with most exercises. Pt to benefit from continued strenthening/stabilization and manual as needed for pain.    Personal Factors and Comorbidities Comorbidity 1    Comorbidities Hip labral tear, OA    Examination-Activity Limitations Bend;Squat;Stairs;Stand;Transfers;Lift;Locomotion Level    Examination-Participation Restrictions Cleaning;Community Activity;Meal Prep     Stability/Clinical Decision Making Stable/Uncomplicated    Rehab Potential Good    PT Frequency 2x / week    PT Duration 6 weeks    PT Treatment/Interventions ADLs/Self Care Home Management;Cryotherapy;Electrical Stimulation;Iontophoresis 4mg /ml Dexamethasone;Moist Heat;Traction;Ultrasound;Parrafin;DME Instruction;Gait training;Stair training;Functional mobility training;Therapeutic activities;Therapeutic exercise;Balance training;Manual techniques;Patient/family education;Neuromuscular re-education;Passive range of motion;Dry needling;Taping;Spinal Manipulations;Joint Manipulations    PT Home Exercise Plan [email or web address]    Consulted and Agree with Plan of Care Patient             Patient will benefit from skilled therapeutic intervention in order to improve the following deficits and impairments:  Decreased activity tolerance, Pain, Decreased strength, Hypomobility, Decreased mobility, Increased muscle spasms, Decreased range of motion, Improper body mechanics, Impaired flexibility  Visit Diagnosis: Chronic bilateral low back pain without sciatica  Cervicalgia     Problem List Patient Active Problem List   Diagnosis Date Noted   Degenerative disc disease, cervical 07/06/2021   Lumbar arthritis 07/06/2021   Somatic dysfunction of spine, cervical 05/19/2021   ADD (attention deficit disorder) 02/28/2021   Primary osteoarthritis of right hip 06/09/2020   Osteoporosis 05/17/2020   Closed fracture of right pubis ([place]) 04/22/2020   Tear of right acetabular labrum 04/22/2020   Chronic right hip pain 04/19/2020   Diabetes mellitus type II, controlled ([place]) 02/20/2020   Hyperlipidemia associated with type 2 diabetes mellitus ([place]) 02/20/2020   Hypertension 02/03/2020   Hepatic steatosis 02/03/2020   Morbid obesity ([place]) 02/07/2018   S/P TAH-BSO (total abdominal hysterectomy and bilateral salpingo-oophorectomy) 10/17/2016   OSA (obstructive sleep apnea) 08/10/2016   HIATAL HERNIA  05/27/2010   Vitamin D deficiency 05/23/2010   Allergic rhinitis 03/14/2010   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   Backache 01/24/2010   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   TOBACCO USE 12/01/2008   GERD 12/01/2008   Osteoarthritis 12/01/2008   Lyndee Hensen, PT, DPT 10:52 PM  09/07/21   [facility] [address] [place], [place], [address] Phone: [phone]   Fax:  [phone]  Name: Lori [name] MRN: [id number] Date of Birth: 01-01-1966

## 2021-09-08 ENCOUNTER — Encounter: Payer: Self-pay | Admitting: Physical Therapy

## 2021-09-08 ENCOUNTER — Ambulatory Visit: Payer: 59 | Admitting: Physical Therapy

## 2021-09-08 DIAGNOSIS — M542 Cervicalgia: Secondary | ICD-10-CM

## 2021-09-08 DIAGNOSIS — M545 Low back pain, unspecified: Secondary | ICD-10-CM

## 2021-09-08 DIAGNOSIS — G8929 Other chronic pain: Secondary | ICD-10-CM | POA: Diagnosis not present

## 2021-09-08 LAB — CBC WITH DIFFERENTIAL/PLATELET
Basophils Absolute: 0.1 10*3/uL (ref 0.0–0.1)
Basophils Relative: 1.2 % (ref 0.0–3.0)
Eosinophils Absolute: 0.3 10*3/uL (ref 0.0–0.7)
Eosinophils Relative: 2.7 % (ref 0.0–5.0)
HCT: 41.7 % (ref 36.0–46.0)
Hemoglobin: 14.1 g/dL (ref 12.0–15.0)
Lymphocytes Relative: 53.2 % — ABNORMAL HIGH (ref 12.0–46.0)
Lymphs Abs: 5.6 10*3/uL — ABNORMAL HIGH (ref 0.7–4.0)
MCHC: 33.7 g/dL (ref 30.0–36.0)
MCV: 93.4 fl (ref 78.0–100.0)
Monocytes Absolute: 0.6 10*3/uL (ref 0.1–1.0)
Monocytes Relative: 5.4 % (ref 3.0–12.0)
Neutro Abs: 4 10*3/uL (ref 1.4–7.7)
Neutrophils Relative %: 37.5 % — ABNORMAL LOW (ref 43.0–77.0)
Platelets: 224 10*3/uL (ref 150.0–400.0)
RBC: 4.46 Mil/uL (ref 3.87–5.11)
RDW: 14.7 % (ref 11.5–15.5)
WBC: 10.6 10*3/uL — ABNORMAL HIGH (ref 4.0–10.5)

## 2021-09-08 LAB — COMPREHENSIVE METABOLIC PANEL
ALT: 41 U/L — ABNORMAL HIGH (ref 0–35)
AST: 33 U/L (ref 0–37)
Albumin: 4.7 g/dL (ref 3.5–5.2)
Alkaline Phosphatase: 65 U/L (ref 39–117)
BUN: 22 mg/dL (ref 6–23)
CO2: 25 mEq/L (ref 19–32)
Calcium: 9.9 mg/dL (ref 8.4–10.5)
Chloride: 99 mEq/L (ref 96–112)
Creatinine, Ser: 0.89 mg/dL (ref 0.40–1.20)
GFR: 72.4 mL/min (ref >60.00)
Glucose, Bld: 93 mg/dL (ref 70–99)
Potassium: 3.6 mEq/L (ref 3.5–5.1)
Sodium: 137 mEq/L (ref 135–145)
Total Bilirubin: 0.3 mg/dL (ref 0.2–1.2)
Total Protein: 7.6 g/dL (ref 6.0–8.3)

## 2021-09-08 LAB — LIPID PANEL
Cholesterol: 186 mg/dL (ref 0–200)
HDL: 40.6 mg/dL (ref >39.00)
NonHDL: 145.37
Total CHOL/HDL Ratio: 5
Triglycerides: 242 mg/dL — ABNORMAL HIGH (ref 0.0–149.0)
VLDL: 48.4 mg/dL — ABNORMAL HIGH (ref 0.0–40.0)

## 2021-09-08 LAB — FOLATE: Folate: 9.3 ng/mL (ref >5.9)

## 2021-09-08 LAB — TSH: TSH: 1.59 u[IU]/mL (ref 0.35–5.50)

## 2021-09-08 LAB — LDL CHOLESTEROL, DIRECT: Direct LDL: 123 mg/dL

## 2021-09-08 LAB — VITAMIN D 25 HYDROXY (VIT D DEFICIENCY, FRACTURES): VITD: 31.64 ng/mL (ref 30.00–100.00)

## 2021-09-08 LAB — HEMOGLOBIN A1C: Hgb A1c MFr Bld: 6.4 % (ref 4.6–6.5)

## 2021-09-08 LAB — VITAMIN B12: Vitamin B-12: 901 pg/mL (ref 211–911)

## 2021-09-08 NOTE — Therapy (Signed)
[place], [place], [address] Phone: [phone]   Fax:  [phone]  Physical Therapy Treatment  Patient Details  Name: Lori [name] MRN: [id number] Date of Birth: 07-Aug-1966 Referring Provider (PT): Charlann Boxer   Encounter Date: 09/08/2021   PT End of Session - 09/08/21 1657     Visit Number 3    Number of Visits 12    Date for PT Re-Evaluation 09/19/21    Authorization Type UHC    PT Start Time 6834    PT Stop Time 1962    PT Time Calculation (min) 42 min    Activity Tolerance Patient tolerated treatment well    Behavior During Therapy [facility] for tasks assessed/performed             Past Medical History:  Diagnosis Date   ALLERGIC RHINITIS 03/14/2010   BACK PAIN 01/24/2010   Buttock pain    Constipation    Diabetes ([place])    Fatty liver    GERD 12/01/2008   HBP (high blood pressure)    HIATAL HERNIA 05/27/2010   Hot flashes    Hyperlipidemia    Hypertrophy of tongue papillae 03/14/2010   IBS (irritable bowel syndrome)    Joint pain    Numbness and tingling of left upper and lower extremity    OSTEOARTHRITIS 12/01/2008   Osteoporosis 05/17/2020   Fragility fracture plus T score -1.8 on DEXA scan June 2021   Sciatica, left side    Sleep apnea    Sleep apnea    Thigh pain    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   TOBACCO USE 12/01/2008   VITAMIN D DEFICIENCY 05/23/2010    Past Surgical History:  Procedure Laterality Date   ABDOMINAL HYSTERECTOMY N/A 10/17/2016   Procedure: HYSTERECTOMY ABDOMINAL;  Surgeon: Sanjuana Kava, MD;  Location: [facility]; benign mass in uterus   APPENDECTOMY     BREAST SURGERY     lumpectomy   CERVICAL FUSION     x2; 2007, 2011   [place] N/A 10/17/2016   Procedure: CYSTOSCOPY;  Surgeon: Sanjuana Kava, MD;  Location: [facility];  Service: Gynecology;  Laterality: N/A;   DILATION AND CURETTAGE OF UTERUS     miscarriage   ESOPHAGEAL MANOMETRY      ESOPHAGOGASTRODUODENOSCOPY     GASTRIC FUNDOPLICATION     Nissen   LAPAROSCOPIC NISSEN FUNDOPLICATION     LUMBAR DISC SURGERY     LUMBAR LAMINECTOMY  2005   SALPINGOOPHORECTOMY Bilateral 10/17/2016   Procedure: SALPINGO OOPHORECTOMY;  Surgeon: Sanjuana Kava, MD;  Location: [facility];  Service: Gynecology;  Laterality: Bilateral;   TONSILLECTOMY AND ADENOIDECTOMY     UPPER GASTROINTESTINAL ENDOSCOPY      There were no vitals filed for this visit.   Subjective Assessment - 09/08/21 1657     Subjective Pt states pain in R hip, and bil low back.    Currently in Pain? Yes    Pain Score 4     Pain Location Neck    Pain Orientation Left;Right    Pain Descriptors / Indicators Aching;Tightness    Pain Type Chronic pain    Pain Onset More than a month ago    Pain Frequency Intermittent    Pain Score 6    Pain Location Back    Pain Orientation Right;Left    Pain Descriptors / Indicators Aching    Pain Type Chronic pain  Pain Onset More than a month ago    Pain Frequency Intermittent                               [facility] Adult PT Treatment/Exercise - 09/08/21 0001       Exercises   Exercises Lumbar;Neck      Neck Exercises: Theraband   Rows 20 reps;Blue    Shoulder External Rotation 20 reps;Green      Neck Exercises: Stretches   Upper Trapezius Stretch --      Lumbar Exercises: Stretches   Lower Trunk Rotation 5 reps;10 seconds    Pelvic Tilt --    Standing Side Bend 3 reps;20 seconds;Right;Left    Other Lumbar Stretch Exercise supine shoulder flexion with cane for lat stretch x 5;      Lumbar Exercises: Aerobic   Recumbent Bike L1 x 5 min;      Lumbar Exercises: Standing   Row --    Theraband Level (Row) --      Lumbar Exercises: Seated   Sit to Stand --      Lumbar Exercises: Supine   Bent Knee Raise --    Bent Knee Raise Limitations --    Bridge 20 reps    Other Supine Lumbar Exercises Clam Bl TB small ROM x 20; with TA; Hip add ball sq x 20;       Manual Therapy   Manual Therapy Soft tissue mobilization    Manual therapy comments skilled palpation and monitoring of soft tissue with dry needling    Soft tissue mobilization DTM/TPR to L and R glute , roller to L ITB              Trigger Point Dry Needling - 09/08/21 0001     Consent Given? Yes    Education Handout Provided Yes    Muscles Treated Back/Hip Gluteus maximus;Piriformis    Gluteus Maximus Response Palpable increased muscle length   L   Piriformis Response Palpable increased muscle length   L                    PT Short Term Goals - 08/10/21 1709       PT SHORT TERM GOAL #1   Title Pt to be independent with initial HEP for neck and back    Time 2    Period Weeks    Status New    Target Date 08/22/21               PT Long Term Goals - 08/10/21 1709       PT LONG TERM GOAL #1   Title Pt to be independent with final HEP for neck  and back    Time 6    Period Weeks    Status New    Target Date 09/19/21      PT LONG TERM GOAL #2   Title Pt to report decreased pain in neck and back to 0-2/10 with activity    Time 6    Period Weeks    Status New    Target Date 09/19/21      PT LONG TERM GOAL #3   Title Pt to report ability for at least 30 min of exercise without pain greater than 2/10, to improve ability for activity and weight loss.    Time 6    Period Weeks    Status New    Target  Date 09/19/21      PT LONG TERM GOAL #4   Title Pt to demo improved ROM of lumbar spine to be WNL for pt age. , to imrpove ability for ADLS and IADLS.    Time 6    Period Weeks    Status New    Target Date 09/19/21                   Plan - 09/08/21 1659     Clinical Impression Statement Pt with soreness in multiple locations, neck, bil low back, bil glute/hips, bil knees. Focus on lumbar and hip pain today. Continued education on strengthening for hip in neutral position. Manual for trigger points in bil glutes. Pt with cramping in  L thoracic region with overhead reaching and rotation today.  Plan to continue strengthening, as well as manual and decompression for back and hip pain .    Personal Factors and Comorbidities Comorbidity 1    Comorbidities Hip labral tear, OA    Examination-Activity Limitations Bend;Squat;Stairs;Stand;Transfers;Lift;Locomotion Level    Examination-Participation Restrictions Cleaning;Community Activity;Meal Prep    Stability/Clinical Decision Making Stable/Uncomplicated    Rehab Potential Good    PT Frequency 2x / week    PT Duration 6 weeks    PT Treatment/Interventions ADLs/Self Care Home Management;Cryotherapy;Electrical Stimulation;Iontophoresis 4mg /ml Dexamethasone;Moist Heat;Traction;Ultrasound;Parrafin;DME Instruction;Gait training;Stair training;Functional mobility training;Therapeutic activities;Therapeutic exercise;Balance training;Manual techniques;Patient/family education;Neuromuscular re-education;Passive range of motion;Dry needling;Taping;Spinal Manipulations;Joint Manipulations    PT Home Exercise Plan [id number]    Consulted and Agree with Plan of Care Patient             Patient will benefit from skilled therapeutic intervention in order to improve the following deficits and impairments:  Decreased activity tolerance, Pain, Decreased strength, Hypomobility, Decreased mobility, Increased muscle spasms, Decreased range of motion, Improper body mechanics, Impaired flexibility  Visit Diagnosis: Chronic bilateral low back pain without sciatica  Cervicalgia     Problem List Patient Active Problem List   Diagnosis Date Noted   Degenerative disc disease, cervical 07/06/2021   Lumbar arthritis 07/06/2021   Somatic dysfunction of spine, cervical 05/19/2021   ADD (attention deficit disorder) 02/28/2021   Primary osteoarthritis of right hip 06/09/2020   Osteoporosis 05/17/2020   Closed fracture of right pubis ([place]) 04/22/2020   Tear of right acetabular labrum 04/22/2020    Chronic right hip pain 04/19/2020   Diabetes mellitus type II, controlled ([place]) 02/20/2020   Hyperlipidemia associated with type 2 diabetes mellitus ([place]) 02/20/2020   Hypertension 02/03/2020   Hepatic steatosis 02/03/2020   Morbid obesity ([place]) 02/07/2018   S/P TAH-BSO (total abdominal hysterectomy and bilateral salpingo-oophorectomy) 10/17/2016   OSA (obstructive sleep apnea) 08/10/2016   HIATAL HERNIA 05/27/2010   Vitamin D deficiency 05/23/2010   Allergic rhinitis 03/14/2010   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   Backache 01/24/2010   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   TOBACCO USE 12/01/2008   GERD 12/01/2008   Osteoarthritis 12/01/2008   Lyndee Hensen, PT, DPT 5:08 PM  09/08/21    [facility] [address] [place], [place], [address] Phone: [phone]   Fax:  [phone]  Name: Lori [name] MRN: [id number] Date of Birth: 1966/02/04

## 2021-09-09 ENCOUNTER — Encounter: Payer: Self-pay | Admitting: Family Medicine

## 2021-09-09 MED ORDER — FLUCONAZOLE 150 MG PO TABS: ORAL_TABLET | ORAL | 0 refills | Status: DC

## 2021-09-09 NOTE — Telephone Encounter (Signed)
Spoke with the patient, informed her of the results and she stated her questions were answered as below.

## 2021-09-19 ENCOUNTER — Ambulatory Visit: Payer: 59 | Admitting: Physical Therapy

## 2021-09-19 ENCOUNTER — Other Ambulatory Visit: Payer: Self-pay

## 2021-09-19 ENCOUNTER — Encounter: Payer: Self-pay | Admitting: Physical Therapy

## 2021-09-19 DIAGNOSIS — G8929 Other chronic pain: Secondary | ICD-10-CM | POA: Diagnosis not present

## 2021-09-19 DIAGNOSIS — M545 Low back pain, unspecified: Secondary | ICD-10-CM

## 2021-09-19 DIAGNOSIS — M542 Cervicalgia: Secondary | ICD-10-CM

## 2021-09-25 ENCOUNTER — Encounter: Payer: Self-pay | Admitting: Physical Therapy

## 2021-09-25 NOTE — Therapy (Signed)
[place], [place], [address] Phone: [phone]   Fax:  [phone]  Physical Therapy Treatment  Patient Details  Name: Lori [name] MRN: [id number] Date of Birth: 01/15/1966 Referring Provider (PT): Charlann Boxer   Encounter Date: 09/19/2021   PT End of Session - 09/25/21 1224     Visit Number 4    Number of Visits 12    Date for PT Re-Evaluation 09/19/21    Authorization Type UHC    PT Start Time 8841    PT Stop Time 1600    PT Time Calculation (min) 44 min    Activity Tolerance Patient tolerated treatment well    Behavior During Therapy [facility] for tasks assessed/performed             Past Medical History:  Diagnosis Date   ALLERGIC RHINITIS 03/14/2010   BACK PAIN 01/24/2010   Buttock pain    Constipation    Diabetes ([place])    Fatty liver    GERD 12/01/2008   HBP (high blood pressure)    HIATAL HERNIA 05/27/2010   Hot flashes    Hyperlipidemia    Hypertrophy of tongue papillae 03/14/2010   IBS (irritable bowel syndrome)    Joint pain    Numbness and tingling of left upper and lower extremity    OSTEOARTHRITIS 12/01/2008   Osteoporosis 05/17/2020   Fragility fracture plus T score -1.8 on DEXA scan June 2021   Sciatica, left side    Sleep apnea    Sleep apnea    Thigh pain    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   TOBACCO USE 12/01/2008   VITAMIN D DEFICIENCY 05/23/2010    Past Surgical History:  Procedure Laterality Date   ABDOMINAL HYSTERECTOMY N/A 10/17/2016   Procedure: HYSTERECTOMY ABDOMINAL;  Surgeon: Sanjuana Kava, MD;  Location: [facility]; benign mass in uterus   APPENDECTOMY     BREAST SURGERY     lumpectomy   CERVICAL FUSION     x2; 2007, 2011   [place] N/A 10/17/2016   Procedure: CYSTOSCOPY;  Surgeon: Sanjuana Kava, MD;  Location: [facility];  Service: Gynecology;  Laterality: N/A;   DILATION AND CURETTAGE OF UTERUS     miscarriage   ESOPHAGEAL MANOMETRY      ESOPHAGOGASTRODUODENOSCOPY     GASTRIC FUNDOPLICATION     Nissen   LAPAROSCOPIC NISSEN FUNDOPLICATION     LUMBAR DISC SURGERY     LUMBAR LAMINECTOMY  2005   SALPINGOOPHORECTOMY Bilateral 10/17/2016   Procedure: SALPINGO OOPHORECTOMY;  Surgeon: Sanjuana Kava, MD;  Location: [facility];  Service: Gynecology;  Laterality: Bilateral;   TONSILLECTOMY AND ADENOIDECTOMY     UPPER GASTROINTESTINAL ENDOSCOPY      There were no vitals filed for this visit.   Subjective Assessment - 09/25/21 1224     Subjective Pt states some decreased pain in back and down legs after epidural injection last week. Thoracic and low back still very tight.    Currently in Pain? Yes    Pain Score 4     Pain Location Neck    Pain Orientation Left;Right    Pain Descriptors / Indicators Aching;Tightness    Pain Type Chronic pain    Pain Onset More than a month ago    Pain Frequency Intermittent    Pain Score 6    Pain Location Back    Pain Orientation Right;Left    Pain Descriptors /  Indicators Aching;Tightness    Pain Type Chronic pain    Pain Onset More than a month ago    Pain Frequency Intermittent                               [facility] Adult PT Treatment/Exercise - 09/25/21 0001       Lumbar Exercises: Stretches   Lower Trunk Rotation 5 reps;10 seconds    Standing Side Bend 3 reps;20 seconds;Right;Left    Figure 4 Stretch 3 reps;60 seconds    Figure 4 Stretch Limitations supine      Lumbar Exercises: Supine   Bridge 20 reps      Lumbar Exercises: Sidelying   Hip Abduction 20 reps;Both      Manual Therapy   Soft tissue mobilization DTM/TPR to L and R glute and low lumbar                       PT Short Term Goals - 08/10/21 1709       PT SHORT TERM GOAL #1   Title Pt to be independent with initial HEP for neck and back    Time 2    Period Weeks    Status New    Target Date 08/22/21               PT Long Term Goals - 08/10/21 1709       PT LONG TERM  GOAL #1   Title Pt to be independent with final HEP for neck  and back    Time 6    Period Weeks    Status New    Target Date 09/19/21      PT LONG TERM GOAL #2   Title Pt to report decreased pain in neck and back to 0-2/10 with activity    Time 6    Period Weeks    Status New    Target Date 09/19/21      PT LONG TERM GOAL #3   Title Pt to report ability for at least 30 min of exercise without pain greater than 2/10, to improve ability for activity and weight loss.    Time 6    Period Weeks    Status New    Target Date 09/19/21      PT LONG TERM GOAL #4   Title Pt to demo improved ROM of lumbar spine to be WNL for pt age. , to imrpove ability for ADLS and IADLS.    Time 6    Period Weeks    Status New    Target Date 09/19/21                   Plan - 09/25/21 1250     Clinical Impression Statement Pt with mild pain relief from recent injections. She continues to have quite a bit of tightness in bil lumbar and thoracic region. Ther ex and manual done to improve tightness. Pt to benefit from continued care.    Personal Factors and Comorbidities Comorbidity 1    Comorbidities Hip labral tear, OA    Examination-Activity Limitations Bend;Squat;Stairs;Stand;Transfers;Lift;Locomotion Level    Examination-Participation Restrictions Cleaning;Community Activity;Meal Prep    Stability/Clinical Decision Making Stable/Uncomplicated    Rehab Potential Good    PT Frequency 2x / week    PT Duration 6 weeks    PT Treatment/Interventions ADLs/Self Care Home Management;Cryotherapy;Electrical Stimulation;Iontophoresis 4mg /ml Dexamethasone;Moist Heat;Traction;Ultrasound;Parrafin;DME Instruction;Gait training;Stair training;Functional  mobility training;Therapeutic activities;Therapeutic exercise;Balance training;Manual techniques;Patient/family education;Neuromuscular re-education;Passive range of motion;Dry needling;Taping;Spinal Manipulations;Joint Manipulations    PT Home Exercise Plan  [id number]    Consulted and Agree with Plan of Care Patient             Patient will benefit from skilled therapeutic intervention in order to improve the following deficits and impairments:  Decreased activity tolerance, Pain, Decreased strength, Hypomobility, Decreased mobility, Increased muscle spasms, Decreased range of motion, Improper body mechanics, Impaired flexibility  Visit Diagnosis: Chronic bilateral low back pain without sciatica  Cervicalgia     Problem List Patient Active Problem List   Diagnosis Date Noted   Degenerative disc disease, cervical 07/06/2021   Lumbar arthritis 07/06/2021   Somatic dysfunction of spine, cervical 05/19/2021   ADD (attention deficit disorder) 02/28/2021   Primary osteoarthritis of right hip 06/09/2020   Osteoporosis 05/17/2020   Closed fracture of right pubis ([place]) 04/22/2020   Tear of right acetabular labrum 04/22/2020   Chronic right hip pain 04/19/2020   Diabetes mellitus type II, controlled ([place]) 02/20/2020   Hyperlipidemia associated with type 2 diabetes mellitus ([place]) 02/20/2020   Hypertension 02/03/2020   Hepatic steatosis 02/03/2020   Morbid obesity ([place]) 02/07/2018   S/P TAH-BSO (total abdominal hysterectomy and bilateral salpingo-oophorectomy) 10/17/2016   OSA (obstructive sleep apnea) 08/10/2016   HIATAL HERNIA 05/27/2010   Vitamin D deficiency 05/23/2010   Allergic rhinitis 03/14/2010   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   Backache 01/24/2010   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   TOBACCO USE 12/01/2008   GERD 12/01/2008   Osteoarthritis 12/01/2008    Lyndee Hensen, PT, DPT 12:58 PM  09/25/21    [facility] [address] [place], [place], [address] Phone: [phone]   Fax:  [phone]  Name: Lori [name] MRN: [id number] Date of Birth: 1966-05-09

## 2021-09-26 ENCOUNTER — Encounter: Payer: Self-pay | Admitting: Physical Therapy

## 2021-09-26 ENCOUNTER — Ambulatory Visit: Payer: 59 | Admitting: Physical Therapy

## 2021-09-26 ENCOUNTER — Other Ambulatory Visit: Payer: Self-pay

## 2021-09-26 DIAGNOSIS — G8929 Other chronic pain: Secondary | ICD-10-CM | POA: Diagnosis not present

## 2021-09-26 DIAGNOSIS — M542 Cervicalgia: Secondary | ICD-10-CM | POA: Diagnosis not present

## 2021-09-26 DIAGNOSIS — M545 Low back pain, unspecified: Secondary | ICD-10-CM

## 2021-09-27 NOTE — Therapy (Signed)
[facility], [place], [address] Phone: [phone]   Fax:  [phone]  Physical Therapy Treatment  Patient Details  Name: Lori [name] MRN: [id number] Date of Birth: January 26, 1966 Referring Provider (PT): Charlann Boxer   Encounter Date: 09/26/2021   PT End of Session - 09/26/21 1531     Visit Number 5    Number of Visits 12    Date for PT Re-Evaluation 09/19/21    Authorization Type UHC    PT Start Time 6389    PT Stop Time 1601    PT Time Calculation (min) 38 min    Activity Tolerance Patient tolerated treatment well    Behavior During Therapy [facility] for tasks assessed/performed             Past Medical History:  Diagnosis Date   ALLERGIC RHINITIS 03/14/2010   BACK PAIN 01/24/2010   Buttock pain    Constipation    Diabetes ([place])    Fatty liver    GERD 12/01/2008   HBP (high blood pressure)    HIATAL HERNIA 05/27/2010   Hot flashes    Hyperlipidemia    Hypertrophy of tongue papillae 03/14/2010   IBS (irritable bowel syndrome)    Joint pain    Numbness and tingling of left upper and lower extremity    OSTEOARTHRITIS 12/01/2008   Osteoporosis 05/17/2020   Fragility fracture plus T score -1.8 on DEXA scan June 2021   Sciatica, left side    Sleep apnea    Sleep apnea    Thigh pain    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   TOBACCO USE 12/01/2008   VITAMIN D DEFICIENCY 05/23/2010    Past Surgical History:  Procedure Laterality Date   ABDOMINAL HYSTERECTOMY N/A 10/17/2016   Procedure: HYSTERECTOMY ABDOMINAL;  Surgeon: Sanjuana Kava, MD;  Location: [facility]; benign mass in uterus   APPENDECTOMY     BREAST SURGERY     lumpectomy   CERVICAL FUSION     x2; 2007, 2011   [place] N/A 10/17/2016   Procedure: CYSTOSCOPY;  Surgeon: Sanjuana Kava, MD;  Location: [facility];  Service: Gynecology;  Laterality: N/A;   DILATION AND CURETTAGE OF UTERUS     miscarriage   ESOPHAGEAL MANOMETRY      ESOPHAGOGASTRODUODENOSCOPY     GASTRIC FUNDOPLICATION     Nissen   LAPAROSCOPIC NISSEN FUNDOPLICATION     LUMBAR DISC SURGERY     LUMBAR LAMINECTOMY  2005   SALPINGOOPHORECTOMY Bilateral 10/17/2016   Procedure: SALPINGO OOPHORECTOMY;  Surgeon: Sanjuana Kava, MD;  Location: [facility];  Service: Gynecology;  Laterality: Bilateral;   TONSILLECTOMY AND ADENOIDECTOMY     UPPER GASTROINTESTINAL ENDOSCOPY      There were no vitals filed for this visit.   Subjective Assessment - 09/26/21 1529     Subjective Pt states much soreness in abdomen and back today, due to coughing episode on saturday.    Currently in Pain? Yes    Pain Score 6    Pain Location Back    Pain Orientation Left;Right    Pain Descriptors / Indicators Aching;Tightness    Pain Type Chronic pain    Pain Onset More than a month ago    Pain Frequency Intermittent                               [facility] Adult  PT Treatment/Exercise - 09/27/21 0001       Lumbar Exercises: Stretches   Single Knee to Chest Stretch 3 reps;30 seconds    Lower Trunk Rotation 5 reps;10 seconds    Figure 4 Stretch 3 reps;60 seconds    Figure 4 Stretch Limitations supine    Other Lumbar Stretch Exercise seated lumbar flexion stretch 30 sex x 2;      Lumbar Exercises: Standing   Row 20 reps    Theraband Level (Row) Level 3 (Green)    Other Standing Lumbar Exercises wall angels x 10      Lumbar Exercises: Seated   Sit to Stand 15 reps      Lumbar Exercises: Supine   Bent Knee Raise 15 reps    Bridge 20 reps    Straight Leg Raise 15 reps      Manual Therapy   Soft tissue mobilization STM/myofascial ball to bil thoracic and lumbar paraspinals, and glute.                       PT Short Term Goals - 08/10/21 1709       PT SHORT TERM GOAL #1   Title Pt to be independent with initial HEP for neck and back    Time 2    Period Weeks    Status New    Target Date 08/22/21               PT Long Term Goals -  08/10/21 1709       PT LONG TERM GOAL #1   Title Pt to be independent with final HEP for neck  and back    Time 6    Period Weeks    Status New    Target Date 09/19/21      PT LONG TERM GOAL #2   Title Pt to report decreased pain in neck and back to 0-2/10 with activity    Time 6    Period Weeks    Status New    Target Date 09/19/21      PT LONG TERM GOAL #3   Title Pt to report ability for at least 30 min of exercise without pain greater than 2/10, to improve ability for activity and weight loss.    Time 6    Period Weeks    Status New    Target Date 09/19/21      PT LONG TERM GOAL #4   Title Pt to demo improved ROM of lumbar spine to be WNL for pt age. , to imrpove ability for ADLS and IADLS.    Time 6    Period Weeks    Status New    Target Date 09/19/21                   Plan - 09/27/21 0957     Clinical Impression Statement Pt with quite a bit of soreness today in back and abdominal musculature from coughing a lot over the weekend. Discussed continuing stretching, mobility, and use of heat. She has pain in multiple locations, neck, back, hip and knees. Most pain in last couple weeks has been in bil thoracici/lumbar region. She has had minimal pain relief of back at this time, but will benefit from continued care for decreasing muscle tension, pain, and improving ability for functional activity.    Personal Factors and Comorbidities Comorbidity 1    Comorbidities Hip labral tear, OA    Examination-Activity Limitations Bend;Squat;Stairs;Stand;Transfers;Lift;Locomotion  Level    Examination-Participation Restrictions Cleaning;Community Activity;Meal Prep    Stability/Clinical Decision Making Stable/Uncomplicated    Rehab Potential Good    PT Frequency 2x / week    PT Duration 6 weeks    PT Treatment/Interventions ADLs/Self Care Home Management;Cryotherapy;Electrical Stimulation;Iontophoresis 4mg /ml Dexamethasone;Moist Heat;Traction;Ultrasound;Parrafin;DME  Instruction;Gait training;Stair training;Functional mobility training;Therapeutic activities;Therapeutic exercise;Balance training;Manual techniques;Patient/family education;Neuromuscular re-education;Passive range of motion;Dry needling;Taping;Spinal Manipulations;Joint Manipulations    PT Home Exercise Plan [id number]    Consulted and Agree with Plan of Care Patient             Patient will benefit from skilled therapeutic intervention in order to improve the following deficits and impairments:  Decreased activity tolerance, Pain, Decreased strength, Hypomobility, Decreased mobility, Increased muscle spasms, Decreased range of motion, Improper body mechanics, Impaired flexibility  Visit Diagnosis: Chronic bilateral low back pain without sciatica  Cervicalgia     Problem List Patient Active Problem List   Diagnosis Date Noted   Degenerative disc disease, cervical 07/06/2021   Lumbar arthritis 07/06/2021   Somatic dysfunction of spine, cervical 05/19/2021   ADD (attention deficit disorder) 02/28/2021   Primary osteoarthritis of right hip 06/09/2020   Osteoporosis 05/17/2020   Closed fracture of right pubis ([place]) 04/22/2020   Tear of right acetabular labrum 04/22/2020   Chronic right hip pain 04/19/2020   Diabetes mellitus type II, controlled ([place]) 02/20/2020   Hyperlipidemia associated with type 2 diabetes mellitus ([place]) 02/20/2020   Hypertension 02/03/2020   Hepatic steatosis 02/03/2020   Morbid obesity ([place]) 02/07/2018   S/P TAH-BSO (total abdominal hysterectomy and bilateral salpingo-oophorectomy) 10/17/2016   OSA (obstructive sleep apnea) 08/10/2016   HIATAL HERNIA 05/27/2010   Vitamin D deficiency 05/23/2010   Allergic rhinitis 03/14/2010   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   Backache 01/24/2010   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   TOBACCO USE 12/01/2008   GERD 12/01/2008   Osteoarthritis 12/01/2008   Lyndee Hensen, PT, DPT 10:02 AM  09/27/21    [facility]  [facility] [address] [place], [place], [address] Phone: [phone]   Fax:  [phone]  Name: Lori [name] MRN: [id number] Date of Birth: October 16, 1966

## 2021-09-28 NOTE — Progress Notes (Deleted)
[facility] [place] Phone: [phone] Subjective:    I'm seeing this patient by the request  of:  Lori Macadam, MD  CC:   [id number]  Lori [name] is a 55 y.o. female coming in with complaint of back and neck pain. OMT on 08/18/2021. Approved for bilateral gel injections. Patient states   Medications patient has been prescribed: none  Taking:         Reviewed prior external information including notes and imaging from previsou exam, outside providers and external EMR if available.   As well as notes that were available from care everywhere and other healthcare systems.  Past medical history, social, surgical and family history all reviewed in electronic medical record.  No pertanent information unless stated regarding to the chief complaint.   Past Medical History:  Diagnosis Date   ALLERGIC RHINITIS 03/14/2010   BACK PAIN 01/24/2010   Buttock pain    Constipation    Diabetes ([place])    Fatty liver    GERD 12/01/2008   HBP (high blood pressure)    HIATAL HERNIA 05/27/2010   Hot flashes    Hyperlipidemia    Hypertrophy of tongue papillae 03/14/2010   IBS (irritable bowel syndrome)    Joint pain    Numbness and tingling of left upper and lower extremity    OSTEOARTHRITIS 12/01/2008   Osteoporosis 05/17/2020   Fragility fracture plus T score -1.8 on DEXA scan June 2021   Sciatica, left side    Sleep apnea    Sleep apnea    Thigh pain    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   TOBACCO USE 12/01/2008   VITAMIN D DEFICIENCY 05/23/2010    Allergies  Allergen Reactions   Amoxicillin Nausea Only    Yeast Infection Has patient had a PCN reaction causing immediate rash, facial/tongue/throat swelling, SOB or lightheadedness with hypotension: no Has patient had a PCN reaction causing severe rash involving mucus membranes or skin necrosis: no Has patient had a PCN reaction that required hospitalization yes Has patient  had a PCN reaction occurring within the last 10 years: yes If all of the above answers are "NO", then may proceed with Cephalosporin use.  Yeast Infection Has patient had a PCN reaction causing immediate rash, facial/tongue/throat swelling, SOB or lightheadedness with hypotension: no Has patient had a PCN reaction causing severe rash involving mucus membranes or skin necrosis: no Has patient had a PCN reaction that required hospitalization yes Has patient had a PCN reaction occurring within the last 10 years: yes If all of the above answers are "NO", then may proceed with Cephalosporin use.   Eggs Or Egg-Derived Products    Morphine Other (See Comments)    REACTION: hyper REACTION: hyper   Latex Rash   Thiethylperazine Other (See Comments) and Palpitations     Review of Systems:  No headache, visual changes, nausea, vomiting, diarrhea, constipation, dizziness, abdominal pain, skin rash, fevers, chills, night sweats, weight loss, swollen lymph nodes, body aches, joint swelling, chest pain, shortness of breath, mood changes. POSITIVE muscle aches  Objective  Last menstrual period 09/27/2016.   General: No apparent distress alert and oriented x3 mood and affect normal, dressed appropriately.  HEENT: Pupils equal, extraocular movements intact  Respiratory: Patient's speak in full sentences and does not appear short of breath  Cardiovascular: No lower extremity edema, non tender, no erythema  Neuro: Cranial nerves II through XII are intact, neurovascularly intact in all extremities  with 2+ DTRs and 2+ pulses.  Gait normal with good balance and coordination.  MSK:  Non tender with full range of motion and good stability and symmetric strength and tone of shoulders, elbows, wrist, hip, knee and ankles bilaterally.  Back - Normal skin, Spine with normal alignment and no deformity.  No tenderness to vertebral process palpation.  Paraspinous muscles are not tender and without spasm.   Range of  motion is full at neck and lumbar sacral regions  Osteopathic findings  C2 flexed rotated and side bent right C6 flexed rotated and side bent left T3 extended rotated and side bent right inhaled rib T9 extended rotated and side bent left L2 flexed rotated and side bent right Sacrum right on right       Assessment and Plan:    Nonallopathic problems  Decision today to treat with OMT was based on Physical Exam  After verbal consent patient was treated with HVLA, ME, FPR techniques in cervical, rib, thoracic, lumbar, and sacral  areas  Patient tolerated the procedure well with improvement in symptoms  Patient given exercises, stretches and lifestyle modifications  See medications in patient instructions if given  Patient will follow up in 4-8 weeks      The above documentation has been reviewed and is accurate and complete Lori [name]       Note: This dictation was prepared with Diplomatic Services operational officer dictation along with smaller Company secretary. Any transcriptional errors that result from this process are unintentional.

## 2021-09-29 ENCOUNTER — Other Ambulatory Visit: Payer: Self-pay

## 2021-09-29 ENCOUNTER — Ambulatory Visit: Payer: 59 | Admitting: Sports Medicine

## 2021-09-29 ENCOUNTER — Ambulatory Visit: Payer: 59 | Admitting: Family Medicine

## 2021-09-29 VITALS — BP 160/86 | HR 86 | Ht 64.0 in | Wt 228.0 lb

## 2021-09-29 DIAGNOSIS — M549 Dorsalgia, unspecified: Secondary | ICD-10-CM | POA: Diagnosis not present

## 2021-09-29 DIAGNOSIS — M9905 Segmental and somatic dysfunction of pelvic region: Secondary | ICD-10-CM

## 2021-09-29 DIAGNOSIS — M5030 Other cervical disc degeneration, unspecified cervical region: Secondary | ICD-10-CM

## 2021-09-29 DIAGNOSIS — M9902 Segmental and somatic dysfunction of thoracic region: Secondary | ICD-10-CM | POA: Diagnosis not present

## 2021-09-29 DIAGNOSIS — G8929 Other chronic pain: Secondary | ICD-10-CM | POA: Diagnosis not present

## 2021-09-29 DIAGNOSIS — M9903 Segmental and somatic dysfunction of lumbar region: Secondary | ICD-10-CM | POA: Diagnosis not present

## 2021-09-29 NOTE — Patient Instructions (Addendum)
Follow up in 6 weeks for OMT Start exercises on stationary bike, elliptical, or water aerobics

## 2021-09-29 NOTE — Progress Notes (Signed)
Benito Mccreedy D.[place] Phone: [phone]   Assessment and Plan:     1. Chronic bilateral back pain, unspecified back location 2. Somatic dysfunction of thoracic region 3. Somatic dysfunction of lumbar region 4. Somatic dysfunction of pelvic region -Chronic with exacerbation - Patient has tolerated OMT well in the past so elected for repeat treatment today.  Tolerated well per below - With multiple chronic pains, encouraged patient to start physical activity specifically elliptical, stationary bike, or water aerobics to help offload some of her weight to allow for better tolerated exercise - Continue follow-up with pain management for chronic pain meds   Decision today to treat with OMT was based on Physical Exam   After verbal consent patient was treated with HVLA (high velocity low amplitude), ME (muscle energy), FPR (flex positional release), ST (soft tissue), PC/PD (Pelvic Compression/ Pelvic Decompression) techniques in thoracic, lumbar, and pelvic areas. Patient tolerated the procedure well with improvement in symptoms.  Patient educated on potential side effects of soreness and recommended to rest, hydrate, and use Tylenol as needed for pain control.   Pertinent previous records reviewed include previous sports medicine notes   Follow Up: In 6 weeks for repeat OMT   Subjective:   I, Vilma Meckel, am serving as a Education administrator for Dr. Hulan Saas.  Chief Complaint: neck and back pain  HPI: sciatica pain is better bc of injections ([facility]), pt wasn't helping, flexeril at night has been helping some. Omt or chiro  09/29/21   Relevant Historical Information: Seen by Dr. Tamala Julian 08/18/2021 Postsurgical changes noted as well as significant arthritic changes noted.  Discussed with patient about icing regimen and home exercises.  Attempted osteopathic manipulation again.  See how patient responds.  Discussed  different medications.  Patient is already on chronic narcotics from another provider.  Discussed other things such as potential epidurals.  Patient will consider all the other things and please read AVS.  Follow-up again in 6 to 8 weeks  Additional pertinent review of systems negative.  Current Outpatient Medications  Medication Sig Dispense Refill   Biotin w/ Vitamins C & E (HAIR/SKIN/NAILS PO) Take by mouth daily.     cetirizine (ZYRTEC) 10 MG tablet Take 10 mg by mouth at bedtime.     Cholecalciferol (VITAMIN D3 PO) Take 1,000 Units by mouth daily.     diazepam (VALIUM) 5 MG tablet TAKE 1 TABLET BY MOUTH EVERY 12 HOURS AS NEEDED FOR ANXIETY OR MUSCLE SPASMS 60 tablet 0   fluconazole (DIFLUCAN) 150 MG tablet 1 tablet PO every 72 hours 5 tablet 0   FLUoxetine (PROZAC) 40 MG capsule Take 1 capsule (40 mg total) by mouth daily. 90 capsule 1   fluticasone (FLONASE) 50 MCG/ACT nasal spray Place 2 sprays into both nostrils daily.     hydrochlorothiazide (HYDRODIURIL) 25 MG tablet Take 1 tablet (25 mg total) by mouth daily. 90 tablet 0   ibuprofen (ADVIL) 200 MG tablet Take 800 mg by mouth as needed.     ketoconazole (NIZORAL) 2 % cream Apply topically 2 (two) times daily as needed for irritation. 30 g 2   losartan (COZAAR) 50 MG tablet TAKE 1 TABLET BY MOUTH EVERY DAY 90 tablet 1   Multiple Vitamins-Minerals (EMERGEN-C IMMUNE PO) Take 1 tablet by mouth daily. With zinc     oxyCODONE-acetaminophen (PERCOCET) 10-325 MG tablet Take 1 tablet by mouth 5 (five) times daily as needed.  rOPINIRole (REQUIP) 0.25 MG tablet Take 0.25 mg by mouth at bedtime.     rosuvastatin (CRESTOR) 10 MG tablet TAKE 1 TABLET BY MOUTH EVERY DAY 90 tablet 1   Semaglutide (RYBELSUS) 3 MG TABS Take 3 mg by mouth daily. 30 tablet 0   tiZANidine (ZANAFLEX) 4 MG tablet Take 4 mg by mouth at bedtime.     Current Facility-Administered Medications  Medication Dose Route Frequency Provider Last Rate Last Admin   0.9 %  sodium  chloride infusion  500 mL Intravenous Once [place], MD          Objective:     Vitals:   09/29/21 1551  BP: (!) 160/86  Pulse: 86  SpO2: 95%  Weight: 228 lb (103.4 kg)  Height: 5\' 4"  (1.626 m)      Body mass index is 39.14 kg/m.    Physical Exam:     General: Well-appearing, cooperative, sitting comfortably in no acute distress.   OMT Physical Exam:  ASIS Compression Test: Positive Right Thoracic: TTP paraspinal, T6-10 RRSL with minimal to no movement Lumbar: TTP paraspinal, L1-3 RRSL Pelvis: Right anterior innominate  Electronically signed by:  Benito Mccreedy D.[facility] 4:34 PM 09/29/21

## 2021-10-03 ENCOUNTER — Ambulatory Visit: Payer: 59 | Admitting: Physical Therapy

## 2021-10-03 ENCOUNTER — Other Ambulatory Visit: Payer: Self-pay

## 2021-10-03 DIAGNOSIS — M546 Pain in thoracic spine: Secondary | ICD-10-CM

## 2021-10-03 DIAGNOSIS — M25562 Pain in left knee: Secondary | ICD-10-CM | POA: Diagnosis not present

## 2021-10-03 DIAGNOSIS — M542 Cervicalgia: Secondary | ICD-10-CM | POA: Diagnosis not present

## 2021-10-03 DIAGNOSIS — M545 Low back pain, unspecified: Secondary | ICD-10-CM

## 2021-10-03 DIAGNOSIS — G8929 Other chronic pain: Secondary | ICD-10-CM

## 2021-10-03 DIAGNOSIS — M25561 Pain in right knee: Secondary | ICD-10-CM

## 2021-10-06 ENCOUNTER — Encounter: Payer: 59 | Admitting: Physical Therapy

## 2021-10-06 ENCOUNTER — Telehealth: Payer: Self-pay

## 2021-10-06 ENCOUNTER — Encounter: Payer: Self-pay | Admitting: Physical Therapy

## 2021-10-06 NOTE — Telephone Encounter (Signed)
Lauren from [facility] stated this patient saw you last week. And that you recommended her to go to the pool for exercise. Lauren has been seeing her here for PT , but she is making minimal progress and has several areas of pain. She is interested in going to [facility] for aquatic PT , so that she can learn HEP, etc. If you thinks it is an ok idea, can we put a script in for that for her? Lauren thinks shed do well with that. thanks so much.

## 2021-10-06 NOTE — Therapy (Signed)
[facility] [address] [place], [place], [address] Phone: [phone]   Fax:  [phone]  Physical Therapy Treatment/Re-Cert   Patient Details  Name: Lori [name] MRN: [id number] Date of Birth: [birth year] Referring Provider (PT): Charlann Boxer   Encounter Date: 10/03/2021   PT End of Session - 10/06/21 0901     Visit Number 6    Number of Visits 20    Date for PT Re-Evaluation 11/28/21    Authorization Type UHC    PT Start Time 1600    PT Stop Time 5009    PT Time Calculation (min) 44 min    Activity Tolerance Patient tolerated treatment well    Behavior During Therapy [facility] for tasks assessed/performed             Past Medical History:  Diagnosis Date   ALLERGIC RHINITIS 03/14/2010   BACK PAIN 01/24/2010   Buttock pain    Constipation    Diabetes ([place])    Fatty liver    GERD 12/01/2008   HBP (high blood pressure)    HIATAL HERNIA 05/27/2010   Hot flashes    Hyperlipidemia    Hypertrophy of tongue papillae 03/14/2010   IBS (irritable bowel syndrome)    Joint pain    Numbness and tingling of left upper and lower extremity    OSTEOARTHRITIS 12/01/2008   Osteoporosis 05/17/2020   Fragility fracture plus T score -1.8 on DEXA scan June 2021   Sciatica, left side    Sleep apnea    Sleep apnea    Thigh pain    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   TOBACCO USE 12/01/2008   VITAMIN D DEFICIENCY 05/23/2010    Past Surgical History:  Procedure Laterality Date   ABDOMINAL HYSTERECTOMY N/A 10/17/2016   Procedure: HYSTERECTOMY ABDOMINAL;  Surgeon: Sanjuana Kava, MD;  Location: [facility]; benign mass in uterus   APPENDECTOMY     BREAST SURGERY     lumpectomy   CERVICAL FUSION     x2; 2007, 2011   [place] N/A 10/17/2016   Procedure: CYSTOSCOPY;  Surgeon: Sanjuana Kava, MD;  Location: [facility];  Service: Gynecology;  Laterality: N/A;   DILATION AND CURETTAGE OF UTERUS     miscarriage   ESOPHAGEAL  MANOMETRY     ESOPHAGOGASTRODUODENOSCOPY     GASTRIC FUNDOPLICATION     [place]   LAPAROSCOPIC [place] FUNDOPLICATION     LUMBAR DISC SURGERY     LUMBAR LAMINECTOMY  2005   SALPINGOOPHORECTOMY Bilateral 10/17/2016   Procedure: SALPINGO OOPHORECTOMY;  Surgeon: Sanjuana Kava, MD;  Location: [facility];  Service: Gynecology;  Laterality: Bilateral;   TONSILLECTOMY AND ADENOIDECTOMY     UPPER GASTROINTESTINAL ENDOSCOPY      There were no vitals filed for this visit.   Subjective Assessment - 10/06/21 0859     Subjective Pt states she continues to have quite a bit of pain in thoracic and lumbar region, as well as into R hip. UTs and bil knees sore too. Back is most bothersome.    Pertinent History R hip labral tear, bil knee pain/ OA    Patient Stated Goals Decrease pain in neck and back, increase ability for activity and exercise.    Currently in Pain? Yes    Pain Score 4     Pain Location Neck    Pain Orientation Left;Right    Pain Descriptors / Indicators Aching;Tightness    Pain Type  Chronic pain    Pain Onset More than a month ago    Pain Frequency Intermittent    Pain Score 6    Pain Location Back    Pain Orientation Right;Left;Lower;Mid    Pain Descriptors / Indicators Aching;Tightness    Pain Type Chronic pain    Pain Onset More than a month ago    Pain Frequency Intermittent                [facility] PT Assessment - 10/06/21 0001       Assessment   Medical Diagnosis Neck and Back Pain    Referring Provider (PT) Charlann Boxer    Prior Therapy yes, 1 yr ago.      Precautions   Precautions None      Balance Screen   Has the patient fallen in the past 6 months No      Prior Function   Level of Independence Independent      Cognition   Overall Cognitive Status Within Functional Limits for tasks assessed      AROM   Cervical Flexion mild limitation    Cervical Extension mild limitation    Cervical - Right Side Bend mild limitation    Cervical - Left Side Bend mild  limitation    Cervical - Right Rotation mild limitation    Cervical - Left Rotation mild limitation    Lumbar Flexion mod limitation    Lumbar Extension mod limitation      Strength   Overall Strength Comments HIps: 4/5, Knees: 4/5, UE: 4+/5      Palpation   Palpation comment tightness and soreness in bil cervical paraspinals, UTs, much Tightness in bil thoracic and lumbar paraspinals and bil QL      Special Tests   Other special tests Neg SLR, + fadir on R, poor patella tracking bil, mild patella crepitus with ROM.                           [facility] Adult PT Treatment/Exercise - 10/06/21 0001       Lumbar Exercises: Stretches   Lower Trunk Rotation 5 reps;10 seconds    Standing Side Bend 3 reps;20 seconds;Right;Left    Figure 4 Stretch 3 reps;60 seconds    Figure 4 Stretch Limitations supine    Other Lumbar Stretch Exercise seated lumbar flexion stretch 30 sex x 2; s/l QL sretch on R 1 min;      Lumbar Exercises: Standing   Row 20 reps    Theraband Level (Row) Level 3 (Green)    Other Standing Lumbar Exercises wall angels x 15      Lumbar Exercises: Seated   Long Arc Quad on Chair 20 reps;Both    LAQ on Chair Weights (lbs) 3    Sit to Stand 15 reps      Lumbar Exercises: Supine   Bridge 20 reps    Straight Leg Raise 15 reps      Manual Therapy   Soft tissue mobilization STM/TPR and myofascial ball to bil thoracic and lumbar paraspinals,                     PT Education - 10/06/21 0901     Education Details Reviewed HEP    Person(s) Educated Patient    Methods Explanation;Demonstration;Verbal cues;Handout    Comprehension Verbalized understanding;Returned demonstration;Verbal cues required              PT  Short Term Goals - 10/06/21 0903       PT SHORT TERM GOAL #1   Title Pt to be independent with initial HEP for neck and back    Time 2    Period Weeks    Status Achieved               PT Long Term Goals - 10/06/21  0905       PT LONG TERM GOAL #1   Title Pt to be independent with final HEP for neck, back, and knees    Time 8    Period Weeks    Status Revised    Target Date 11/28/21      PT LONG TERM GOAL #2   Title Pt to report decreased pain in neck, back , and knees to 0-2/10 with activity    Time 8    Period Weeks    Status On-going    Target Date 11/28/21      PT LONG TERM GOAL #3   Title Pt to report ability for at least 30 min of exercise without pain greater than 2/10, to improve ability for activity and weight loss.    Time 8    Period Weeks    Status On-going    Target Date 11/28/21      PT LONG TERM GOAL #4   Title Pt to demo improved ROM of lumbar spine to be WNL for pt age. , to imrpove ability for ADLS and IADLS.    Time 6    Period Weeks    Status On-going    Target Date 11/28/21                   Plan - 10/06/21 0908     Clinical Impression Statement Pt has been seen for 6 visits. She continues to have bothersome pain in several areas, neck, thoracic, low back, and bil knees. She has significant tightness in thoracic and lumbar musculature with pain and limited ROM. She has anterior knee pain consistent with patella femoral issues and OA. R hip also sore, with labral tear, but is less bothersome than back pain. She has had minimal piain relief with PT thus far. She is doing well with HEP. She has been limited with ability for exercise and weight loss due to pain. Discussed transfer of care to our other PT clinic for  aquatic therapy, for improved ability and decreased pain with activity. Pt also interested in learning aquatic HEP and joining pool. Pt in agreement with plan of transferring to aquatic PT. Discussed continuing bike and walking as able. Re-cert done today .    Personal Factors and Comorbidities Comorbidity 1    Comorbidities Hip labral tear, OA    Examination-Activity Limitations Bend;Squat;Stairs;Stand;Transfers;Lift;Locomotion Level     Examination-Participation Restrictions Cleaning;Community Activity;Meal Prep    Stability/Clinical Decision Making Stable/Uncomplicated    Rehab Potential Good    PT Frequency 2x / week    PT Duration 6 weeks    PT Treatment/Interventions ADLs/Self Care Home Management;Cryotherapy;Electrical Stimulation;Iontophoresis 4mg /ml Dexamethasone;Moist Heat;Traction;Ultrasound;Parrafin;DME Instruction;Gait training;Stair training;Functional mobility training;Therapeutic activities;Therapeutic exercise;Balance training;Manual techniques;Patient/family education;Neuromuscular re-education;Passive range of motion;Dry needling;Taping;Spinal Manipulations;Joint Manipulations    PT Home Exercise Plan [email or web address]    Consulted and Agree with Plan of Care Patient             Patient will benefit from skilled therapeutic intervention in order to improve the following deficits and impairments:  Decreased activity tolerance, Pain, Decreased strength, Hypomobility, Decreased mobility,  Increased muscle spasms, Decreased range of motion, Improper body mechanics, Impaired flexibility  Visit Diagnosis: Chronic bilateral low back pain without sciatica  Pain in thoracic spine  Cervicalgia  Chronic pain of left knee  Chronic pain of right knee     Problem List Patient Active Problem List   Diagnosis Date Noted   Degenerative disc disease, cervical 07/06/2021   Lumbar arthritis 07/06/2021   Somatic dysfunction of spine, cervical 05/19/2021   ADD (attention deficit disorder) 02/28/2021   Primary osteoarthritis of right hip 06/09/2020   Osteoporosis 05/17/2020   Closed fracture of right pubis ([place]) 04/22/2020   Tear of right acetabular labrum 04/22/2020   Chronic right hip pain 04/19/2020   Diabetes mellitus type II, controlled ([place]) 02/20/2020   Hyperlipidemia associated with type 2 diabetes mellitus ([place]) 02/20/2020   Hypertension 02/03/2020   Hepatic steatosis 02/03/2020   Morbid obesity ([place])  02/07/2018   S/P TAH-BSO (total abdominal hysterectomy and bilateral salpingo-oophorectomy) 10/17/2016   OSA (obstructive sleep apnea) 08/10/2016   HIATAL HERNIA 05/27/2010   Vitamin D deficiency 05/23/2010   Allergic rhinitis 03/14/2010   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

## 2021-10-06 NOTE — Patient Instructions (Signed)
Access Code: [email or web address] URL: [email or web address] Date: 10/06/2021 Prepared by: Lyndee Hensen  Exercises Supine Posterior Pelvic Tilt - 2 x daily - 2 sets - 10 reps Single Knee to Chest Stretch - 2 x daily - 3 reps - 30 hold Supine Lower Trunk Rotation - 2 x daily - 10 reps - 5 hold Hooklying Clamshell with Resistance - 1 x daily - 2 sets - 10 reps Supine Bridge - 1 x daily - 2 sets - 10 reps Sidelying Hip Abduction - 1 x daily - 2 sets - 10 reps Seated Cervical Sidebending Stretch - 2 x daily - 3 reps - 30 hold Wall Angels - 1 x daily - 1 sets - 10 reps Standing Row with Anchored Resistance - 1 x daily - 2 sets - 10 reps

## 2021-10-07 NOTE — Addendum Note (Signed)
Addended by: Vilma Meckel R on: 10/07/2021 11:39 AM   Modules accepted: Orders

## 2021-10-07 NOTE — Telephone Encounter (Signed)
Input order for Aquatic PT
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
## 2021-10-14 ENCOUNTER — Ambulatory Visit: Payer: 59 | Admitting: Family Medicine

## 2021-10-19 NOTE — Progress Notes (Signed)
Lori [name] [address] [place] Phone: [phone] Subjective:   [name], am serving as a scribe for Lori [name]. This visit occurred during the SARS-CoV-2 public health emergency.  Safety protocols were in place, including screening questions prior to the visit, additional usage of staff PPE, and extensive cleaning of exam room while observing appropriate contact time as indicated for disinfecting solutions.   I'm seeing this patient by the request  of:  Lori [name], Lori Berg, MD  CC: Bilateral knee pain  OIN:[id number]  Lori [name] is a 55 y.o. female coming in with complaint of B knee pain. Last seen on 08/18/2021 for cervical spine and knee pain. Has seen Dr. Glennon [name] since last visit for OMT. Patient here for gel injection. [place] approved. Patient states knee pain is still apparent. Here for gel injections. Back still hurts. No new complaints       Past Medical History:  Diagnosis Date   ALLERGIC RHINITIS 03/14/2010   BACK PAIN 01/24/2010   Buttock pain    Constipation    Diabetes ([place])    Fatty liver    GERD 12/01/2008   HBP (high blood pressure)    HIATAL HERNIA 05/27/2010   Hot flashes    Hyperlipidemia    Hypertrophy of tongue papillae 03/14/2010   IBS (irritable bowel syndrome)    Joint pain    Numbness and tingling of left upper and lower extremity    OSTEOARTHRITIS 12/01/2008   Osteoporosis 05/17/2020   Fragility fracture plus T score -1.8 on DEXA scan June 2021   Sciatica, left side    Sleep apnea    Sleep apnea    Thigh pain    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   TOBACCO USE 12/01/2008   VITAMIN D DEFICIENCY 05/23/2010   Past Surgical History:  Procedure Laterality Date   ABDOMINAL HYSTERECTOMY N/A 10/17/2016   Procedure: HYSTERECTOMY ABDOMINAL;  Surgeon: Lori Kava, MD;  Location: [facility]; benign mass in uterus   APPENDECTOMY     BREAST SURGERY     lumpectomy   CERVICAL FUSION     x2; 2007, 2011    [place] N/A 10/17/2016   Procedure: CYSTOSCOPY;  Surgeon: Lori Kava, MD;  Location: [facility];  Service: Gynecology;  Laterality: N/A;   DILATION AND CURETTAGE OF UTERUS     miscarriage   ESOPHAGEAL MANOMETRY     ESOPHAGOGASTRODUODENOSCOPY     GASTRIC FUNDOPLICATION     Nissen   LAPAROSCOPIC NISSEN FUNDOPLICATION     LUMBAR DISC SURGERY     LUMBAR LAMINECTOMY  2005   SALPINGOOPHORECTOMY Bilateral 10/17/2016   Procedure: SALPINGO OOPHORECTOMY;  Surgeon: Lori Kava, MD;  Location: [facility];  Service: Gynecology;  Laterality: Bilateral;   TONSILLECTOMY AND ADENOIDECTOMY     UPPER GASTROINTESTINAL ENDOSCOPY     Social History   Socioeconomic History   Marital status: Married    Spouse name: Lori [name]   Number of children: 1   Years of education: Not on file   Highest education level: Not on file  Occupational History   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  Tobacco Use   Smoking status: Every Day    Packs/day: 1.00    Years: 36.00    Pack years: 36.00    Types: Cigarettes   Smokeless tobacco: Never   Tobacco comments:    now smoking 0.5 packs a day  Vaping Use   Vaping Use: Former   Devices: tried them for a month or 2  Substance and Sexual Activity   Alcohol use: Yes    Comment: social   Drug use: No   Sexual activity: Not on file  Other Topics Concern   Not on file  Social History Narrative   Not on file   Social Determinants of Health   Financial Resource Strain: Not on file  Food Insecurity: Not on file  Transportation Needs: Not on file  Physical Activity: Not on file  Stress: Not on file  Social Connections: Not on file   Allergies  Allergen Reactions   Amoxicillin Nausea Only    Yeast Infection Has patient had a PCN reaction causing immediate rash, facial/tongue/throat swelling, SOB or lightheadedness with hypotension: no Has patient had a PCN reaction causing severe rash involving mucus membranes  or skin necrosis: no Has patient had a PCN reaction that required hospitalization yes Has patient had a PCN reaction occurring within the last 10 years: yes If all of the above answers are "NO", then may proceed with Cephalosporin use.  Yeast Infection Has patient had a PCN reaction causing immediate rash, facial/tongue/throat swelling, SOB or lightheadedness with hypotension: no Has patient had a PCN reaction causing severe rash involving mucus membranes or skin necrosis: no Has patient had a PCN reaction that required hospitalization yes Has patient had a PCN reaction occurring within the last 10 years: yes If all of the above answers are "NO", then may proceed with Cephalosporin use.   Eggs Or Egg-Derived Products    Morphine Other (See Comments)    REACTION: hyper REACTION: hyper   Latex Rash   Thiethylperazine Other (See Comments) and Palpitations   Family History  Problem Relation Age of Onset   Breast cancer Mother 77   High blood pressure Mother    High Cholesterol Mother    Diabetes Mother    Diverticulitis Mother 74   Heart attack Mother 80       during hospitalization with diverticulitis   Heart disease Mother    Obesity Mother    Alzheimer's disease Father    Alcohol abuse Father    Sleep apnea Father    Colon polyps Half-Sister    Colonic polyp Half-Brother    Esophageal cancer Neg Hx    Colon cancer Neg Hx    Rectal cancer Neg Hx    Stomach cancer Neg Hx     Current Outpatient Medications (Endocrine & Metabolic):    Semaglutide (RYBELSUS) 3 MG TABS, Take 3 mg by mouth daily.   Current Outpatient Medications (Cardiovascular):    hydrochlorothiazide (HYDRODIURIL) 25 MG tablet, Take 1 tablet (25 mg total) by mouth daily.   losartan (COZAAR) 50 MG tablet, TAKE 1 TABLET BY MOUTH EVERY DAY   rosuvastatin (CRESTOR) 10 MG tablet, TAKE 1 TABLET BY MOUTH EVERY DAY   Current Outpatient Medications (Respiratory):    cetirizine (ZYRTEC) 10 MG tablet, Take 10 mg by  mouth at bedtime.   fluticasone (FLONASE) 50 MCG/ACT nasal spray, Place 2 sprays into both nostrils daily.   Current Outpatient Medications (Analgesics):    ibuprofen (ADVIL) 200 MG tablet, Take 800 mg by mouth as needed.   oxyCODONE-acetaminophen (PERCOCET) 10-325 MG tablet, Take 1 tablet by mouth 5 (five) times daily as needed.     Current Outpatient Medications (Other):    Biotin w/ Vitamins C & E (HAIR/SKIN/NAILS PO), Take by mouth daily.   Cholecalciferol (VITAMIN D3 PO),  Take 1,000 Units by mouth daily.   diazepam (VALIUM) 5 MG tablet, TAKE 1 TABLET BY MOUTH EVERY 12 HOURS AS NEEDED FOR ANXIETY OR MUSCLE SPASMS   fluconazole (DIFLUCAN) 150 MG tablet, 1 tablet PO every 72 hours   FLUoxetine (PROZAC) 40 MG capsule, Take 1 capsule (40 mg total) by mouth daily.   ketoconazole (NIZORAL) 2 % cream, Apply topically 2 (two) times daily as needed for irritation.   Multiple Vitamins-Minerals (EMERGEN-C IMMUNE PO), Take 1 tablet by mouth daily. With zinc   rOPINIRole (REQUIP) 0.25 MG tablet, Take 0.25 mg by mouth at bedtime.   tiZANidine (ZANAFLEX) 4 MG tablet, Take 4 mg by mouth at bedtime.  Current Facility-Administered Medications (Other):    0.9 %  sodium chloride infusion   Reviewed prior external information including notes and imaging from  primary care provider As well as notes that were available from care everywhere and other healthcare systems.  Past medical history, social, surgical and family history all reviewed in electronic medical record.  No pertanent information unless stated regarding to the chief complaint.   Review of Systems:  No headache, visual changes, nausea, vomiting, diarrhea, constipation, dizziness, abdominal pain, skin rash, fevers, chills, night sweats, weight loss, swollen lymph nodes, body aches, joint swelling, chest pain, shortness of breath, mood changes. POSITIVE muscle aches  Objective  Blood pressure 130/60, pulse 91, height 5\' 4"  (1.626 m),  weight 227 lb (103 kg), last menstrual period 09/27/2016, SpO2 98 %.   General: No apparent distress alert and oriented x3 mood and affect normal, dressed appropriately.  HEENT: Pupils equal, extraocular movements intact  Respiratory: Patient's speak in full sentences and does not appear short of breath  Cardiovascular: No lower extremity edema, non tender, no erythema  Gait normal with good balance and coordination.  MSK: Bilateral knees do have lateral tracking of the patella noted with positive patellar grind test noted.  Tender to palpation diffusely over the medial and lateral joint lines as well.  Involuntary guarding noted.  After informed written and verbal consent, patient was seated on exam table. Right knee was prepped with alcohol swab and utilizing anterolateral approach, patient's right knee space was injected with 60 mg per 3 mL of Durolane (sodium hyaluronate) in a prefilled syringe was injected easily into the knee through a 22-gauge needle..Patient tolerated the procedure well without immediate complications.  After informed written and verbal consent, patient was seated on exam table. Left knee was prepped with alcohol swab and utilizing anterolateral approach, patient's left knee space was injected with 60 mg per 3 mL of Durolane (sodium hyaluronate) in a prefilled syringe was injected easily into the knee through a 22-gauge needle..Patient tolerated the procedure well without immediate complications.   Impression and Recommendations:     The above documentation has been reviewed and is accurate and complete Lyndal Pulley, DO

## 2021-10-20 ENCOUNTER — Other Ambulatory Visit: Payer: Self-pay

## 2021-10-20 ENCOUNTER — Ambulatory Visit: Payer: 59 | Admitting: Family Medicine

## 2021-10-20 DIAGNOSIS — M170 Bilateral primary osteoarthritis of knee: Secondary | ICD-10-CM | POA: Diagnosis not present

## 2021-10-20 NOTE — Assessment & Plan Note (Signed)
Chronic, with exacerbation.  Knee x-rays show mild to moderate arthritic changes.  Likely will contribute to this and likely the viscosupplementation will be helpful.  Warned of potential side effects.  Patient will follow up with me again in 6 weeks.  At that time we will restart likely osteopathic manipulation with patient low back pain as well.

## 2021-10-20 NOTE — Patient Instructions (Signed)
Happy [place] Day May take up to 2 weeks to work See you again in December for manipulation

## 2021-10-24 ENCOUNTER — Ambulatory Visit: Payer: Self-pay

## 2021-10-24 ENCOUNTER — Other Ambulatory Visit: Payer: Self-pay

## 2021-10-24 ENCOUNTER — Encounter: Payer: Self-pay | Admitting: Rheumatology

## 2021-10-24 ENCOUNTER — Ambulatory Visit: Payer: 59 | Admitting: Rheumatology

## 2021-10-24 VITALS — BP 136/73 | HR 83 | Resp 16 | Ht 64.0 in | Wt 226.0 lb

## 2021-10-24 DIAGNOSIS — F17200 Nicotine dependence, unspecified, uncomplicated: Secondary | ICD-10-CM

## 2021-10-24 DIAGNOSIS — S73191D Other sprain of right hip, subsequent encounter: Secondary | ICD-10-CM

## 2021-10-24 DIAGNOSIS — M1611 Unilateral primary osteoarthritis, right hip: Secondary | ICD-10-CM

## 2021-10-24 DIAGNOSIS — M79671 Pain in right foot: Secondary | ICD-10-CM | POA: Diagnosis not present

## 2021-10-24 DIAGNOSIS — K219 Gastro-esophageal reflux disease without esophagitis: Secondary | ICD-10-CM

## 2021-10-24 DIAGNOSIS — F909 Attention-deficit hyperactivity disorder, unspecified type: Secondary | ICD-10-CM

## 2021-10-24 DIAGNOSIS — M791 Myalgia, unspecified site: Secondary | ICD-10-CM

## 2021-10-24 DIAGNOSIS — M79641 Pain in right hand: Secondary | ICD-10-CM

## 2021-10-24 DIAGNOSIS — M79642 Pain in left hand: Secondary | ICD-10-CM

## 2021-10-24 DIAGNOSIS — M26609 Unspecified temporomandibular joint disorder, unspecified side: Secondary | ICD-10-CM

## 2021-10-24 DIAGNOSIS — E1165 Type 2 diabetes mellitus with hyperglycemia: Secondary | ICD-10-CM

## 2021-10-24 DIAGNOSIS — M9901 Segmental and somatic dysfunction of cervical region: Secondary | ICD-10-CM

## 2021-10-24 DIAGNOSIS — G4733 Obstructive sleep apnea (adult) (pediatric): Secondary | ICD-10-CM

## 2021-10-24 DIAGNOSIS — M2550 Pain in unspecified joint: Secondary | ICD-10-CM | POA: Diagnosis not present

## 2021-10-24 DIAGNOSIS — Z90710 Acquired absence of both cervix and uterus: Secondary | ICD-10-CM

## 2021-10-24 DIAGNOSIS — I10 Essential (primary) hypertension: Secondary | ICD-10-CM

## 2021-10-24 DIAGNOSIS — M79672 Pain in left foot: Secondary | ICD-10-CM | POA: Diagnosis not present

## 2021-10-24 DIAGNOSIS — Z9079 Acquired absence of other genital organ(s): Secondary | ICD-10-CM

## 2021-10-24 DIAGNOSIS — E559 Vitamin D deficiency, unspecified: Secondary | ICD-10-CM

## 2021-10-24 DIAGNOSIS — R768 Other specified abnormal immunological findings in serum: Secondary | ICD-10-CM

## 2021-10-24 DIAGNOSIS — E1169 Type 2 diabetes mellitus with other specified complication: Secondary | ICD-10-CM

## 2021-10-24 DIAGNOSIS — M5136 Other intervertebral disc degeneration, lumbar region: Secondary | ICD-10-CM

## 2021-10-24 DIAGNOSIS — E785 Hyperlipidemia, unspecified: Secondary | ICD-10-CM

## 2021-10-24 DIAGNOSIS — Z90722 Acquired absence of ovaries, bilateral: Secondary | ICD-10-CM

## 2021-10-24 DIAGNOSIS — M5030 Other cervical disc degeneration, unspecified cervical region: Secondary | ICD-10-CM

## 2021-10-24 DIAGNOSIS — K760 Fatty (change of) liver, not elsewhere classified: Secondary | ICD-10-CM

## 2021-10-24 NOTE — Patient Instructions (Signed)
Hand Exercises Hand exercises can be helpful for almost anyone. These exercises can strengthen the hands, improve flexibility and movement, and increase blood flow to the hands. These results can make work and daily tasks easier. Hand exercises can be especially helpful for people who have joint pain from arthritis or have nerve damage from overuse (carpal tunnel syndrome). These exercises can also help people who have injured a hand. Exercises Most of these hand exercises are gentle stretching and motion exercises. It is usually safe to do them often throughout the day. Warming up your hands before exercise may help to reduce stiffness. You can do this with gentle massage or by placing your hands in warm water for 10-15 minutes. It is normal to feel some stretching, pulling, tightness, or mild discomfort as you begin new exercises. This will gradually improve. Stop an exercise right away if you feel sudden, severe pain or your pain gets worse. Ask your health care provider which exercises are best for you. Knuckle bend or "claw" fist  Stand or sit with your arm, hand, and all five fingers pointed straight up. Make sure to keep your wrist straight during the exercise. Gently bend your fingers down toward your palm until the tips of your fingers are touching the top of your palm. Keep your big knuckle straight and just bend the small knuckles in your fingers. Hold this position for __________ seconds. Straighten (extend) your fingers back to the starting position. Repeat this exercise 5-10 times with each hand. Full finger fist  Stand or sit with your arm, hand, and all five fingers pointed straight up. Make sure to keep your wrist straight during the exercise. Gently bend your fingers into your palm until the tips of your fingers are touching the middle of your palm. Hold this position for __________ seconds. Extend your fingers back to the starting position, stretching every joint fully. Repeat  this exercise 5-10 times with each hand. Straight fist Stand or sit with your arm, hand, and all five fingers pointed straight up. Make sure to keep your wrist straight during the exercise. Gently bend your fingers at the big knuckle, where your fingers meet your hand, and the middle knuckle. Keep the knuckle at the tips of your fingers straight and try to touch the bottom of your palm. Hold this position for __________ seconds. Extend your fingers back to the starting position, stretching every joint fully. Repeat this exercise 5-10 times with each hand. Tabletop  Stand or sit with your arm, hand, and all five fingers pointed straight up. Make sure to keep your wrist straight during the exercise. Gently bend your fingers at the big knuckle, where your fingers meet your hand, as far down as you can while keeping the small knuckles in your fingers straight. Think of forming a tabletop with your fingers. Hold this position for __________ seconds. Extend your fingers back to the starting position, stretching every joint fully. Repeat this exercise 5-10 times with each hand. Finger spread  Place your hand flat on a table with your palm facing down. Make sure your wrist stays straight as you do this exercise. Spread your fingers and thumb apart from each other as far as you can until you feel a gentle stretch. Hold this position for __________ seconds. Bring your fingers and thumb tight together again. Hold this position for __________ seconds. Repeat this exercise 5-10 times with each hand. Making circles  Stand or sit with your arm, hand, and all five fingers pointed   straight up. Make sure to keep your wrist straight during the exercise. Make a circle by touching the tip of your thumb to the tip of your index finger. Hold for __________ seconds. Then open your hand wide. Repeat this motion with your thumb and each finger on your hand. Repeat this exercise 5-10 times with each hand. Thumb  motion  Sit with your forearm resting on a table and your wrist straight. Your thumb should be facing up toward the ceiling. Keep your fingers relaxed as you move your thumb. Lift your thumb up as high as you can toward the ceiling. Hold for __________ seconds. Bend your thumb across your palm as far as you can, reaching the tip of your thumb for the small finger (pinkie) side of your palm. Hold for __________ seconds. Repeat this exercise 5-10 times with each hand. Grip strengthening  Hold a stress ball or other soft ball in the middle of your hand. Slowly increase the pressure, squeezing the ball as much as you can without causing pain. Think of bringing the tips of your fingers into the middle of your palm. All of your finger joints should bend when doing this exercise. Hold your squeeze for __________ seconds, then relax. Repeat this exercise 5-10 times with each hand. Contact a health care provider if: Your hand pain or discomfort gets much worse when you do an exercise. Your hand pain or discomfort does not improve within 2 hours after you exercise. If you have any of these problems, stop doing these exercises right away. Do not do them again unless your health care provider says that you can. Get help right away if: You develop sudden, severe hand pain or swelling. If this happens, stop doing these exercises right away. Do not do them again unless your health care provider says that you can. This information is not intended to replace advice given to you by your health care provider. Make sure you discuss any questions you have with your health care provider. Document Revised: 03/17/2021 Document Reviewed: 03/17/2021 Elsevier Patient Education  2022 [employer]  

## 2021-10-25 ENCOUNTER — Ambulatory Visit (HOSPITAL_BASED_OUTPATIENT_CLINIC_OR_DEPARTMENT_OTHER): Payer: 59 | Attending: Sports Medicine | Admitting: Physical Therapy

## 2021-10-25 ENCOUNTER — Encounter (HOSPITAL_BASED_OUTPATIENT_CLINIC_OR_DEPARTMENT_OTHER): Payer: Self-pay | Admitting: Physical Therapy

## 2021-10-25 DIAGNOSIS — G8929 Other chronic pain: Secondary | ICD-10-CM | POA: Diagnosis not present

## 2021-10-25 DIAGNOSIS — M9903 Segmental and somatic dysfunction of lumbar region: Secondary | ICD-10-CM | POA: Diagnosis not present

## 2021-10-25 DIAGNOSIS — M25562 Pain in left knee: Secondary | ICD-10-CM | POA: Diagnosis present

## 2021-10-25 DIAGNOSIS — M549 Dorsalgia, unspecified: Secondary | ICD-10-CM | POA: Insufficient documentation

## 2021-10-25 DIAGNOSIS — M545 Low back pain, unspecified: Secondary | ICD-10-CM | POA: Diagnosis present

## 2021-10-25 DIAGNOSIS — M9902 Segmental and somatic dysfunction of thoracic region: Secondary | ICD-10-CM | POA: Insufficient documentation

## 2021-10-25 DIAGNOSIS — M6281 Muscle weakness (generalized): Secondary | ICD-10-CM | POA: Insufficient documentation

## 2021-10-25 DIAGNOSIS — M25561 Pain in right knee: Secondary | ICD-10-CM | POA: Insufficient documentation

## 2021-10-25 DIAGNOSIS — M5030 Other cervical disc degeneration, unspecified cervical region: Secondary | ICD-10-CM | POA: Insufficient documentation
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
## 2021-10-26 LAB — RHEUMATOID FACTOR: Rheumatoid fact SerPl-aCnc: <14 IU/mL (ref <14)

## 2021-10-26 LAB — CK: Total CK: 112 U/L (ref 29–143)

## 2021-10-26 LAB — CYCLIC CITRUL PEPTIDE ANTIBODY, IGG: Cyclic Citrullin Peptide Ab: <16 UNITS

## 2021-10-26 LAB — SEDIMENTATION RATE: Sed Rate: 2 mm/h (ref 0–30)

## 2021-10-26 NOTE — Progress Notes (Signed)
All the labs are within normal limits.  I will discuss results at the follow-up visit.

## 2021-10-31 ENCOUNTER — Other Ambulatory Visit: Payer: Self-pay | Admitting: Family Medicine

## 2021-10-31 ENCOUNTER — Encounter: Payer: Self-pay | Admitting: Family Medicine

## 2021-10-31 DIAGNOSIS — M542 Cervicalgia: Secondary | ICD-10-CM

## 2021-10-31 MED ORDER — AMPHETAMINE-DEXTROAMPHET ER 10 MG PO CP24
10.0000 mg | ORAL_CAPSULE | Freq: Every day | ORAL | 0 refills | Status: DC
Start: 2021-10-31 — End: 2021-11-23

## 2021-11-01 ENCOUNTER — Other Ambulatory Visit: Payer: Self-pay | Admitting: Family Medicine

## 2021-11-01 DIAGNOSIS — M542 Cervicalgia: Secondary | ICD-10-CM

## 2021-11-01 MED ORDER — DIAZEPAM 5 MG PO TABS: ORAL_TABLET | ORAL | 0 refills | Status: DC
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
## 2021-11-22 ENCOUNTER — Other Ambulatory Visit: Payer: Self-pay

## 2021-11-22 ENCOUNTER — Ambulatory Visit: Payer: 59 | Admitting: Rheumatology

## 2021-11-22 ENCOUNTER — Encounter: Payer: Self-pay | Admitting: Rheumatology

## 2021-11-22 VITALS — BP 116/76 | HR 91 | Ht 64.0 in | Wt 230.0 lb

## 2021-11-22 DIAGNOSIS — F909 Attention-deficit hyperactivity disorder, unspecified type: Secondary | ICD-10-CM

## 2021-11-22 DIAGNOSIS — M2550 Pain in unspecified joint: Secondary | ICD-10-CM

## 2021-11-22 DIAGNOSIS — F17200 Nicotine dependence, unspecified, uncomplicated: Secondary | ICD-10-CM

## 2021-11-22 DIAGNOSIS — M26609 Unspecified temporomandibular joint disorder, unspecified side: Secondary | ICD-10-CM

## 2021-11-22 DIAGNOSIS — M51369 Other intervertebral disc degeneration, lumbar region without mention of lumbar back pain or lower extremity pain: Secondary | ICD-10-CM

## 2021-11-22 DIAGNOSIS — G4733 Obstructive sleep apnea (adult) (pediatric): Secondary | ICD-10-CM

## 2021-11-22 DIAGNOSIS — M5136 Other intervertebral disc degeneration, lumbar region: Secondary | ICD-10-CM

## 2021-11-22 DIAGNOSIS — M19041 Primary osteoarthritis, right hand: Secondary | ICD-10-CM

## 2021-11-22 DIAGNOSIS — M19071 Primary osteoarthritis, right ankle and foot: Secondary | ICD-10-CM

## 2021-11-22 DIAGNOSIS — R768 Other specified abnormal immunological findings in serum: Secondary | ICD-10-CM

## 2021-11-22 DIAGNOSIS — S73191D Other sprain of right hip, subsequent encounter: Secondary | ICD-10-CM

## 2021-11-22 DIAGNOSIS — M797 Fibromyalgia: Secondary | ICD-10-CM

## 2021-11-22 DIAGNOSIS — M19042 Primary osteoarthritis, left hand: Secondary | ICD-10-CM

## 2021-11-22 DIAGNOSIS — E785 Hyperlipidemia, unspecified: Secondary | ICD-10-CM

## 2021-11-22 DIAGNOSIS — E1165 Type 2 diabetes mellitus with hyperglycemia: Secondary | ICD-10-CM

## 2021-11-22 DIAGNOSIS — E1169 Type 2 diabetes mellitus with other specified complication: Secondary | ICD-10-CM

## 2021-11-22 DIAGNOSIS — M791 Myalgia, unspecified site: Secondary | ICD-10-CM

## 2021-11-22 DIAGNOSIS — M5030 Other cervical disc degeneration, unspecified cervical region: Secondary | ICD-10-CM

## 2021-11-22 DIAGNOSIS — M19072 Primary osteoarthritis, left ankle and foot: Secondary | ICD-10-CM

## 2021-11-22 DIAGNOSIS — I10 Essential (primary) hypertension: Secondary | ICD-10-CM

## 2021-11-22 DIAGNOSIS — K219 Gastro-esophageal reflux disease without esophagitis: Secondary | ICD-10-CM

## 2021-11-22 DIAGNOSIS — K760 Fatty (change of) liver, not elsewhere classified: Secondary | ICD-10-CM

## 2021-11-22 DIAGNOSIS — E559 Vitamin D deficiency, unspecified: Secondary | ICD-10-CM

## 2021-11-22 DIAGNOSIS — M1611 Unilateral primary osteoarthritis, right hip: Secondary | ICD-10-CM

## 2021-11-22 NOTE — Patient Instructions (Addendum)
Hand Exercises Hand exercises can be helpful for almost anyone. These exercises can strengthen the hands, improve flexibility and movement, and increase blood flow to the hands. These results can make work and daily tasks easier. Hand exercises can be especially helpful for people who have joint pain from arthritis or have nerve damage from overuse (carpal tunnel syndrome). These exercises can also help people who have injured a hand. Exercises Most of these hand exercises are gentle stretching and motion exercises. It is usually safe to do them often throughout the day. Warming up your hands before exercise may help to reduce stiffness. You can do this with gentle massage or by placing your hands in warm water for 10-15 minutes. It is normal to feel some stretching, pulling, tightness, or mild discomfort as you begin new exercises. This will gradually improve. Stop an exercise right away if you feel sudden, severe pain or your pain gets worse. Ask your health care provider which exercises are best for you. Knuckle bend or "claw" fist  Stand or sit with your arm, hand, and all five fingers pointed straight up. Make sure to keep your wrist straight during the exercise. Gently bend your fingers down toward your palm until the tips of your fingers are touching the top of your palm. Keep your big knuckle straight and just bend the small knuckles in your fingers. Hold this position for __________ seconds. Straighten (extend) your fingers back to the starting position. Repeat this exercise 5-10 times with each hand. Full finger fist  Stand or sit with your arm, hand, and all five fingers pointed straight up. Make sure to keep your wrist straight during the exercise. Gently bend your fingers into your palm until the tips of your fingers are touching the middle of your palm. Hold this position for __________ seconds. Extend your fingers back to the starting position, stretching every joint fully. Repeat  this exercise 5-10 times with each hand. Straight fist Stand or sit with your arm, hand, and all five fingers pointed straight up. Make sure to keep your wrist straight during the exercise. Gently bend your fingers at the big knuckle, where your fingers meet your hand, and the middle knuckle. Keep the knuckle at the tips of your fingers straight and try to touch the bottom of your palm. Hold this position for __________ seconds. Extend your fingers back to the starting position, stretching every joint fully. Repeat this exercise 5-10 times with each hand. Tabletop  Stand or sit with your arm, hand, and all five fingers pointed straight up. Make sure to keep your wrist straight during the exercise. Gently bend your fingers at the big knuckle, where your fingers meet your hand, as far down as you can while keeping the small knuckles in your fingers straight. Think of forming a tabletop with your fingers. Hold this position for __________ seconds. Extend your fingers back to the starting position, stretching every joint fully. Repeat this exercise 5-10 times with each hand. Finger spread  Place your hand flat on a table with your palm facing down. Make sure your wrist stays straight as you do this exercise. Spread your fingers and thumb apart from each other as far as you can until you feel a gentle stretch. Hold this position for __________ seconds. Bring your fingers and thumb tight together again. Hold this position for __________ seconds. Repeat this exercise 5-10 times with each hand. Making circles  Stand or sit with your arm, hand, and all five fingers pointed  straight up. Make sure to keep your wrist straight during the exercise. Make a circle by touching the tip of your thumb to the tip of your index finger. Hold for __________ seconds. Then open your hand wide. Repeat this motion with your thumb and each finger on your hand. Repeat this exercise 5-10 times with each hand. Thumb  motion  Sit with your forearm resting on a table and your wrist straight. Your thumb should be facing up toward the ceiling. Keep your fingers relaxed as you move your thumb. Lift your thumb up as high as you can toward the ceiling. Hold for __________ seconds. Bend your thumb across your palm as far as you can, reaching the tip of your thumb for the small finger (pinkie) side of your palm. Hold for __________ seconds. Repeat this exercise 5-10 times with each hand. Grip strengthening  Hold a stress ball or other soft ball in the middle of your hand. Slowly increase the pressure, squeezing the ball as much as you can without causing pain. Think of bringing the tips of your fingers into the middle of your palm. All of your finger joints should bend when doing this exercise. Hold your squeeze for __________ seconds, then relax. Repeat this exercise 5-10 times with each hand. Contact a health care provider if: Your hand pain or discomfort gets much worse when you do an exercise. Your hand pain or discomfort does not improve within 2 hours after you exercise. If you have any of these problems, stop doing these exercises right away. Do not do them again unless your health care provider says that you can. Get help right away if: You develop sudden, severe hand pain or swelling. If this happens, stop doing these exercises right away. Do not do them again unless your health care provider says that you can. This information is not intended to replace advice given to you by your health care provider. Make sure you discuss any questions you have with your health care provider. Document Revised: 03/17/2021 Document Reviewed: 03/17/2021 Elsevier Patient Education  [place] [facility] Ask your health care provider which exercises are safe for you. Do exercises exactly as told by your health care provider and adjust them as directed. It is normal to feel mild stretching,  pulling, tightness, or discomfort as you do these exercises. Stop right away if you feel sudden pain or your pain gets significantly worse. Do not begin these exercises until told by your health care provider. Stretching and range-of-motion exercises These exercises warm up your muscles and joints and improve the movement and flexibility of your hip and pelvis. Quadriceps stretch, prone  Lie on your abdomen (prone position) on a firm surface, such as a bed or padded floor. Bend your left / right knee and reach back to hold your ankle or pant leg. If you cannot reach your ankle or pant leg, loop a belt around your foot and grab the belt instead. Gently pull your heel toward your buttocks. Your knee should not slide out to the side. You should feel a stretch in the front of your thigh and knee (quadriceps). Hold this position for __________ seconds. Repeat __________ times. Complete this exercise __________ times a day. Iliotibial band stretch An iliotibial band is a strong band of muscle tissue that runs from the outer side of your hip to the outer side of your thigh and knee. Lie on your side with your left / right leg in the top  position. Bend both of your knees and grab your left / right ankle. Stretch out your bottom arm to help you balance. Slowly bring your top knee back so your thigh goes behind your trunk. Slowly lower your top leg toward the floor until you feel a gentle stretch on the outside of your left / right hip and thigh. If you do not feel a stretch and your knee will not fall farther, place the heel of your other foot on top of your knee and pull your knee down toward the floor with your foot. Hold this position for __________ seconds. Repeat __________ times. Complete this exercise __________ times a day. Strengthening exercises These exercises build strength and endurance in your hip and pelvis. Endurance is the ability to use your muscles for a long time, even after they get  tired. Straight leg raises, side-lying This exercise strengthens the muscles that rotate the leg at the hip and move it away from your body (hip abductors). Lie on your side with your left / right leg in the top position. Lie so your head, shoulder, hip, and knee line up. You may bend your bottom knee to help you balance. Roll your hips slightly forward so your hips are stacked directly over each other and your left / right knee is facing forward. Tense the muscles in your outer thigh and lift your top leg 4-6 inches (10-15 cm). Hold this position for __________ seconds. Slowly lower your leg to return to the starting position. Let your muscles relax completely before doing another repetition. Repeat __________ times. Complete this exercise __________ times a day. Leg raises, prone This exercise strengthens the muscles that move the hips backward (hip extensors). Lie on your abdomen (prone position) on your bed or a firm surface. You can put a pillow under your hips if that is more comfortable for your lower back. Bend your left / right knee so your foot is straight up in the air. Squeeze your buttocks muscles and lift your left / right thigh off the bed. Do not let your back arch. Tense your thigh muscle as hard as you can without increasing any knee pain. Hold this position for __________ seconds. Slowly lower your leg to return to the starting position and allow it to relax completely. Repeat __________ times. Complete this exercise __________ times a day. Hip hike Stand sideways on a bottom step. Stand on your left / right leg with your other foot unsupported next to the step. You can hold on to a railing or wall for balance if needed. Keep your knees straight and your torso square. Then lift your left / right hip up toward the ceiling. Slowly let your left / right hip lower toward the floor, past the starting position. Your foot should get closer to the floor. Do not lean or bend your  knees. Repeat __________ times. Complete this exercise __________ times a day. This information is not intended to replace advice given to you by your health care provider. Make sure you discuss any questions you have with your health care provider. Document Revised: 02/04/2020 Document Reviewed: 02/04/2020 Elsevier Patient Education  [place]. Back Exercises The following exercises strengthen the muscles that help to support the trunk (torso) and back. They also help to keep the lower back flexible. Doing these exercises can help to prevent or lessen existing low back pain. If you have back pain or discomfort, try doing these exercises 2-3 times each day or as told by  your health care provider. As your pain improves, do them once each day, but increase the number of times that you repeat the steps for each exercise (do more repetitions). To prevent the recurrence of back pain, continue to do these exercises once each day or as told by your health care provider. Do exercises exactly as told by your health care provider and adjust them as directed. It is normal to feel mild stretching, pulling, tightness, or discomfort as you do these exercises, but you should stop right away if you feel sudden pain or your pain gets worse. Exercises Single knee to chest Repeat these steps 3-5 times for each leg: Lie on your back on a firm bed or the floor with your legs extended. Bring one knee to your chest. Your other leg should stay extended and in contact with the floor. Hold your knee in place by grabbing your knee or thigh with both hands and hold. Pull on your knee until you feel a gentle stretch in your lower back or buttocks. Hold the stretch for 10-30 seconds. Slowly release and straighten your leg.  Pelvic tilt Repeat these steps 5-10 times: Lie on your back on a firm bed or the floor with your legs extended. Bend your knees so they are pointing toward the ceiling and your feet are flat  on the floor. Tighten your lower abdominal muscles to press your lower back against the floor. This motion will tilt your pelvis so your tailbone points up toward the ceiling instead of pointing to your feet or the floor. With gentle tension and even breathing, hold this position for 5-10 seconds.  Cat-cow Repeat these steps until your lower back becomes more flexible: Get into a hands-and-knees position on a firm bed or the floor. Keep your hands under your shoulders, and keep your knees under your hips. You may place padding under your knees for comfort. Let your head hang down toward your chest. Contract your abdominal muscles and point your tailbone toward the floor so your lower back becomes rounded like the back of a cat. Hold this position for 5 seconds. Slowly lift your head, let your abdominal muscles relax, and point your tailbone up toward the ceiling so your back forms a sagging arch like the back of a cow. Hold this position for 5 seconds.  Press-ups Repeat these steps 5-10 times: Lie on your abdomen (face-down) on a firm bed or the floor. Place your palms near your head, about shoulder-width apart. Keeping your back as relaxed as possible and keeping your hips on the floor, slowly straighten your arms to raise the top half of your body and lift your shoulders. Do not use your back muscles to raise your upper torso. You may adjust the placement of your hands to make yourself more comfortable. Hold this position for 5 seconds while you keep your back relaxed. Slowly return to lying flat on the floor.  Bridges Repeat these steps 10 times: Lie on your back on a firm bed or the floor. Bend your knees so they are pointing toward the ceiling and your feet are flat on the floor. Your arms should be flat at your sides, next to your body. Tighten your buttocks muscles and lift your buttocks off the floor until your waist is at almost the same height as your knees. You should feel the  muscles working in your buttocks and the back of your thighs. If you do not feel these muscles, slide your feet 1-2 inches (  2.5-5 cm) farther away from your buttocks. Hold this position for 3-5 seconds. Slowly lower your hips to the starting position, and allow your buttocks muscles to relax completely. If this exercise is too easy, try doing it with your arms crossed over your chest. Abdominal crunches Repeat these steps 5-10 times: Lie on your back on a firm bed or the floor with your legs extended. Bend your knees so they are pointing toward the ceiling and your feet are flat on the floor. Cross your arms over your chest. Tip your chin slightly toward your chest without bending your neck. Tighten your abdominal muscles and slowly raise your torso high enough to lift your shoulder blades a tiny bit off the floor. Avoid raising your torso higher than that because it can put too much stress on your lower back and does not help to strengthen your abdominal muscles. Slowly return to your starting position.  Back lifts Repeat these steps 5-10 times: Lie on your abdomen (face-down) with your arms at your sides, and rest your forehead on the floor. Tighten the muscles in your legs and your buttocks. Slowly lift your chest off the floor while you keep your hips pressed to the floor. Keep the back of your head in line with the curve in your back. Your eyes should be looking at the floor. Hold this position for 3-5 seconds. Slowly return to your starting position.  Contact a health care provider if: Your back pain or discomfort gets much worse when you do an exercise. Your worsening back pain or discomfort does not lessen within 2 hours after you exercise. If you have any of these problems, stop doing these exercises right away. Do not do them again unless your health care provider says that you can. Get help right away if: You develop sudden, severe back pain. If this happens, stop doing the  exercises right away. Do not do them again unless your health care provider says that you can. This information is not intended to replace advice given to you by your health care provider. Make sure you discuss any questions you have with your health care provider. Document Revised: 05/24/2021 Document Reviewed: 02/09/2021 Elsevier Patient Education  [place].

## 2021-11-23 ENCOUNTER — Other Ambulatory Visit: Payer: Self-pay | Admitting: Family Medicine

## 2021-11-23 DIAGNOSIS — I10 Essential (primary) hypertension: Secondary | ICD-10-CM

## 2021-11-24 NOTE — Progress Notes (Signed)
Zach Ronith Berti [place] [address] [place] Phone: [phone] Subjective:   [name], am serving as a scribe for Dr. Hulan Saas. This visit occurred during the SARS-CoV-2 public health emergency.  Safety protocols were in place, including screening questions prior to the visit, additional usage of staff PPE, and extensive cleaning of exam room while observing appropriate contact time as indicated for disinfecting solutions.   I'm seeing this patient by the request  of:  Koberlein, Steele Berg, MD  CC: Back and neck pain follow-up knee pain follow-up  [id number]  Lori [name] is a 55 y.o. female coming in with complaint of back and neck pain. OMT on 10/20/2021. Also seen for knee pain. Patient states knees, hip, and pelvis all hurt. Fell down the stairs.  Patient has had some bruising but seems to be doing okay.  Does seem to have aggravated more of the lower back.  Medications patient has been prescribed: None  Taking:       Past Medical History:  Diagnosis Date   ALLERGIC RHINITIS 03/14/2010   BACK PAIN 01/24/2010   Buttock pain    Constipation    Diabetes ([place])    Fatty liver    GERD 12/01/2008   HBP (high blood pressure)    HIATAL HERNIA 05/27/2010   Hot flashes    Hyperlipidemia    Hypertrophy of tongue papillae 03/14/2010   IBS (irritable bowel syndrome)    Joint pain    Numbness and tingling of left upper and lower extremity    OSTEOARTHRITIS 12/01/2008   Osteoporosis 05/17/2020   Fragility fracture plus T score -1.8 on DEXA scan June 2021   Sciatica, left side    Sleep apnea    Sleep apnea    Thigh pain    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   TOBACCO USE 12/01/2008   VITAMIN D DEFICIENCY 05/23/2010    Allergies  Allergen Reactions   Amoxicillin Nausea Only    Yeast Infection Has patient had a PCN reaction causing immediate rash, facial/tongue/throat swelling, SOB or lightheadedness with hypotension: no Has patient had a  PCN reaction causing severe rash involving mucus membranes or skin necrosis: no Has patient had a PCN reaction that required hospitalization yes Has patient had a PCN reaction occurring within the last 10 years: yes If all of the above answers are "NO", then may proceed with Cephalosporin use.  Yeast Infection Has patient had a PCN reaction causing immediate rash, facial/tongue/throat swelling, SOB or lightheadedness with hypotension: no Has patient had a PCN reaction causing severe rash involving mucus membranes or skin necrosis: no Has patient had a PCN reaction that required hospitalization yes Has patient had a PCN reaction occurring within the last 10 years: yes If all of the above answers are "NO", then may proceed with Cephalosporin use.   Eggs Or Egg-Derived Products    Morphine Other (See Comments)    REACTION: hyper REACTION: hyper   Latex Rash   Thiethylperazine Other (See Comments) and Palpitations     Review of Systems:  No headache, visual changes, nausea, vomiting, diarrhea, constipation, dizziness, abdominal pain, skin rash, fevers, chills, night sweats, weight loss, swollen lymph nodes, body aches, joint swelling, chest pain, shortness of breath, mood changes. POSITIVE muscle aches  Objective  Blood pressure 128/60, pulse 93, height 5\' 4"  (1.626 m), weight 234 lb (106.1 kg), last menstrual period 09/27/2016, SpO2 95 %.   General: No apparent distress alert and oriented x3 mood and  affect normal, dressed appropriately.  HEENT: Pupils equal, extraocular movements intact  Respiratory: Patient's speak in full sentences and does not appear short of breath  Cardiovascular: No lower extremity edema, non tender, no erythema  Patient's knees have no significant swelling.  Still has some mild tenderness.  Patient does have multiple areas of tenderness noted in the musculature.  Patient's low back does have limited range of motion.  Does have loss of lordosis.  Tightness with  straight leg test and FABER test.   Osteopathic findings T9 extended rotated and side bent left L2 flexed rotated and side bent right Sacrum right on right       Assessment and Plan:  Degenerative arthritis of knee, bilateral Stable at the moment.  No change in management.  Degenerative disc disease, cervical Patient does have significant postsurgical changes.  We will continue to monitor.  Needed more muscle energy.  Discussed icing regimen and home exercises.  We will continue to work on the lower back and knee and follow-up again in 6 to 8 weeks.  Lumbar arthritis Known arthritic changes.  Chronic problem with exacerbation.  He does have some tightness noted.  Discussed which activities to do which wants to avoid.   Nonallopathic problems  Decision today to treat with OMT was based on Physical Exam  After verbal consent patient was treated with HVLA, ME, FPR techniques in  thoracic, lumbar, and sacral  areas avoided any type of HVLA on the neck.  Patient tolerated the procedure well with improvement in symptoms  Patient given exercises, stretches and lifestyle modifications  See medications in patient instructions if given  Patient will follow up in 4-8 weeks      The above documentation has been reviewed and is accurate and complete Lori Pulley, DO        Note: This dictation was prepared with Dragon dictation along with smaller phrase technology. Any transcriptional errors that result from this process are unintentional.

## 2021-11-25 ENCOUNTER — Ambulatory Visit (INDEPENDENT_AMBULATORY_CARE_PROVIDER_SITE_OTHER): Payer: 59 | Admitting: Family Medicine

## 2021-11-25 ENCOUNTER — Encounter: Payer: Self-pay | Admitting: Family Medicine

## 2021-11-25 ENCOUNTER — Other Ambulatory Visit: Payer: Self-pay

## 2021-11-25 VITALS — BP 128/60 | HR 93 | Ht 64.0 in | Wt 234.0 lb

## 2021-11-25 DIAGNOSIS — M5030 Other cervical disc degeneration, unspecified cervical region: Secondary | ICD-10-CM

## 2021-11-25 DIAGNOSIS — M9903 Segmental and somatic dysfunction of lumbar region: Secondary | ICD-10-CM

## 2021-11-25 DIAGNOSIS — M5136 Other intervertebral disc degeneration, lumbar region: Secondary | ICD-10-CM | POA: Diagnosis not present

## 2021-11-25 DIAGNOSIS — M9902 Segmental and somatic dysfunction of thoracic region: Secondary | ICD-10-CM

## 2021-11-25 DIAGNOSIS — M170 Bilateral primary osteoarthritis of knee: Secondary | ICD-10-CM | POA: Diagnosis not present

## 2021-11-25 DIAGNOSIS — M9904 Segmental and somatic dysfunction of sacral region: Secondary | ICD-10-CM

## 2021-11-25 MED ORDER — AMPHETAMINE-DEXTROAMPHET ER 10 MG PO CP24
10.0000 mg | ORAL_CAPSULE | Freq: Every day | ORAL | 0 refills | Status: DC
Start: 2021-11-25 — End: 2022-03-10

## 2021-11-25 NOTE — Assessment & Plan Note (Addendum)
Patient does have significant postsurgical changes.  We will continue to monitor.  Needed more .  Discussed icing regimen and home exercises.  We will continue to work on the lower back and knee and follow-up again in 6 to 8 weeks.

## 2021-11-25 NOTE — Assessment & Plan Note (Signed)
Stable at the moment.  No change in management 

## 2021-11-25 NOTE — Patient Instructions (Signed)
Good to see you! Read about Cymbalta  Try to stay active  Start PT in Jan See you again in 7-8 Weeks  Duloxetine Delayed-Release Capsules What is this medication? DULOXETINE (doo LOX e teen) treats depression, anxiety, fibromyalgia, and certain types of chronic pain such as nerve, bone, or joint pain. It increases the amount of serotonin and norepinephrine in the brain, hormones that help regulate mood and pain. It belongs to a group of medications called SNRIs. This medicine may be used for other purposes; ask your health care provider or pharmacist if you have questions. COMMON BRAND NAME(S): Cymbalta, Drizalma, Irenka What should I tell my care team before I take this medication? They need to know if you have any of these conditions: Bipolar disorder Glaucoma High blood pressure Kidney disease Liver disease Seizures Suicidal thoughts, plans or attempt; a previous suicide attempt by you or a family member Take medications that treat or prevent blood clots Taken medications called MAOIs like Carbex, Eldepryl, Marplan, Nardil, and Parnate within 14 days Trouble passing urine An unusual reaction to duloxetine, other medications, foods, dyes, or preservatives Pregnant or trying to get pregnant Breast-feeding How should I use this medication? Take this medication by mouth with a glass of water. Follow the directions on the prescription label. Do not crush, cut or chew some capsules of this medication. Some capsules may be opened and sprinkled on applesauce. Check with your care team or pharmacist if you are not sure. You can take this medication with or without food. Take your medication at regular intervals. Do not take your medication more often than directed. Do not stop taking this medication suddenly except upon the advice of your care team. Stopping this medication too quickly may cause serious side effects or your condition may worsen. A special MedGuide will be given to you by the  pharmacist with each prescription and refill. Be sure to read this information carefully each time. Talk to your care team regarding the use of this medication in children. While this medication may be prescribed for children as young as 63 years of age for selected conditions, precautions do apply. Overdosage: If you think you have taken too much of this medicine contact a poison control center or emergency room at once. NOTE: This medicine is only for you. Do not share this medicine with others. What if I miss a dose? If you miss a dose, take it as soon as you can. If it is almost time for your next dose, take only that dose. Do not take double or extra doses. What may interact with this medication? Do not take this medication with any of the following: Desvenlafaxine Levomilnacipran Linezolid MAOIs like Carbex, Eldepryl, Emsam, Marplan, Nardil, and Parnate Methylene blue (injected into a vein) Milnacipran Safinamide Thioridazine Venlafaxine Viloxazine This medication may also interact with the following: Alcohol Amphetamines Aspirin and aspirin-like medications Certain antibiotics like ciprofloxacin and enoxacin Certain medications for blood pressure, heart disease, irregular heart beat Certain medications for depression, anxiety, or psychotic disturbances Certain medications for migraine headache like almotriptan, eletriptan, frovatriptan, naratriptan, rizatriptan, sumatriptan, zolmitriptan Certain medications that treat or prevent blood clots like warfarin, enoxaparin, and dalteparin Cimetidine Fentanyl Lithium NSAIDS, medications for pain and inflammation, like ibuprofen or naproxen Phentermine Procarbazine Rasagiline Sibutramine St. John's wort Theophylline Tramadol Tryptophan This list may not describe all possible interactions. Give your health care provider a list of all the medicines, herbs, non-prescription drugs, or dietary supplements you use. Also tell them if you  smoke,  drink alcohol, or use illegal drugs. Some items may interact with your medicine. What should I watch for while using this medication? Tell your care team if your symptoms do not get better or if they get worse. Visit your care team for regular checks on your progress. Because it may take several weeks to see the full effects of this medication, it is important to continue your treatment as prescribed by your care team. This medication may cause serious skin reactions. They can happen weeks to months after starting the medication. Contact your care team right away if you notice fevers or flu-like symptoms with a rash. The rash may be red or purple and then turn into blisters or peeling of the skin. Or, you might notice a red rash with swelling of the face, lips, or lymph nodes in your neck or under your arms. Watch for new or worsening thoughts of suicide or depression. This includes sudden changes in mood, behaviors, or thoughts. These changes can happen at any time but are more common in the beginning of treatment or after a change in dose. Call your care team right away if you experience these thoughts or worsening depression. Manic episodes may happen in patients with bipolar disorder who take this medication. Watch for changes in feelings or behaviors such as feeling anxious, nervous, agitated, panicky, irritable, hostile, aggressive, impulsive, severely restless, overly excited and hyperactive, or trouble sleeping. These symptoms can happen at any time, but are more common in the beginning of treatment or after a change in dose. Call your care team right away if you notice any of these symptoms. You may get drowsy or dizzy. Do not drive, use machinery, or do anything that needs mental alertness until you know how this medication affects you. Do not stand or sit up quickly, especially if you are an older patient. This reduces the risk of dizzy or fainting spells. Alcohol may interfere with the  effect of this medication. Avoid alcoholic drinks. This medication may increase blood sugar. The risk may be higher in patients who already have diabetes. Ask your care team what you can do to lower your risk of diabetes while taking this medication. This medication can cause an increase in blood pressure. This medication can also cause a sudden drop in your blood pressure, which may make you feel faint and increase the chance of a fall. These effects are most common when you first start the medication or when the dose is increased, or during use of other medications that can cause a sudden drop in blood pressure. Check with your care team for instructions on monitoring your blood pressure while taking this medication. Your mouth may get dry. Chewing sugarless gum or sucking hard candy, and drinking plenty of water, may help. Contact your care team if the problem does not go away or is severe. What side effects may I notice from receiving this medication? Side effects that you should report to your care team as soon as possible: Allergic reactions--skin rash, itching, hives, swelling of the face, lips, tongue, or throat Bleeding--bloody or black, tar-like stools, red or dark brown urine, vomiting blood or brown material that looks like coffee grounds, small, red or purple spots on skin, unusual bleeding or bruising Increase in blood pressure Liver injury--right upper belly pain, loss of appetite, nausea, light-colored stool, dark yellow or brown urine, yellowing skin or eyes, unusual weakness or fatigue Low sodium level--muscle weakness, fatigue, dizziness, headache, confusion Redness, blistering, peeling, or loosening of  the skin, including inside the mouth Serotonin syndrome--irritability, confusion, fast or irregular heartbeat, muscle stiffness, twitching muscles, sweating, high fever, seizures, chills, vomiting, diarrhea Sudden eye pain or change in vision such as blurry vision, seeing halos around  lights, vision loss Thoughts of suicide or self-harm, worsening mood, feelings of depression Trouble passing urine Side effects that usually do not require medical attention (report to your care team if they continue or are bothersome): Change in sex drive or performance Constipation Diarrhea Dizziness Dry mouth Excessive sweating Loss of appetite Nausea Vomiting This list may not describe all possible side effects. Call your doctor for medical advice about side effects. You may report side effects to FDA at 1-800-FDA-1088. Where should I keep my medication? Keep out of the reach of children and pets. Store at room temperature between 15 and 30 degrees C (59 to 86 degrees F). Get rid of any unused medication after the expiration date. To get rid of medications that are no longer needed or have expired: Take the medication to a medication take-back program. Check with your pharmacy or law enforcement to find a location. If you cannot return the medication, check the label or package insert to see if the medication should be thrown out in the garbage or flushed down the toilet. If you are not sure, ask your care team. If it is safe to put it in the trash, take the medication out of the container. Mix the medication with cat litter, dirt, coffee grounds, or other unwanted substance. Seal the mixture in a bag or container. Put it in the trash. NOTE: This sheet is a summary. It may not cover all possible information. If you have questions about this medicine, talk to your doctor, pharmacist, or health care provider.  2022 [employer]vier/Gold Standard (2020-12-07 00:00:00)

## 2021-11-25 NOTE — Assessment & Plan Note (Signed)
Known arthritic changes.  Chronic problem with exacerbation.  He does have some tightness noted.  Discussed which activities to do which wants to avoid.

## 2021-11-27 ENCOUNTER — Other Ambulatory Visit: Payer: Self-pay | Admitting: Family Medicine

## 2021-11-28 ENCOUNTER — Other Ambulatory Visit: Payer: Self-pay | Admitting: Family Medicine

## 2021-12-27 ENCOUNTER — Other Ambulatory Visit: Payer: Self-pay | Admitting: Family Medicine

## 2021-12-27 DIAGNOSIS — R232 Flushing: Secondary | ICD-10-CM

## 2022-01-06 ENCOUNTER — Ambulatory Visit: Payer: 59 | Admitting: Family Medicine

## 2022-01-06 NOTE — Progress Notes (Signed)
[facility] [place] Phone: [phone] Subjective:   Lori [name], am serving as a scribe for Dr. Hulan Saas. This visit occurred during the SARS-CoV-2 public health emergency.  Safety protocols were in place, including screening questions prior to the visit, additional usage of staff PPE, and extensive cleaning of exam room while observing appropriate contact time as indicated for disinfecting solutions.  I'm seeing this patient by the request  of:  Koberlein, Steele Berg, MD  CC: Low back pain follow-up  DZH:[id number]  Lori [name] is a 56 y.o. female coming in with complaint of back and neck pain. OMT 11/25/2021. Patient states that she continues to lumbar spine pain. Hard to rotate.  Has had tightness recently.  Noted affecting daily activities.  Medications patient has been prescribed: None  Taking:         Reviewed prior external information including notes and imaging from previsou exam, outside providers and external EMR if available.   As well as notes that were available from care everywhere and other healthcare systems.  Past medical history, social, surgical and family history all reviewed in electronic medical record.  [name] pertanent information unless stated regarding to the chief complaint.   Past Medical History:  Diagnosis Date   ALLERGIC RHINITIS 03/14/2010   BACK PAIN 01/24/2010   Buttock pain    Constipation    Diabetes ([place])    Fatty liver    GERD 12/01/2008   HBP (high blood pressure)    HIATAL HERNIA 05/27/2010   Hot flashes    Hyperlipidemia    Hypertrophy of tongue papillae 03/14/2010   IBS (irritable bowel syndrome)    Joint pain    Numbness and tingling of left upper and lower extremity    OSTEOARTHRITIS 12/01/2008   Osteoporosis 05/17/2020   Fragility fracture plus T score -1.8 on DEXA scan June 2021   Sciatica, left side    Sleep apnea    Sleep apnea    Thigh pain    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  Yeast Infection Has patient had a PCN reaction causing immediate rash, facial/tongue/throat swelling, SOB or lightheadedness with hypotension: [name] Has patient had a PCN reaction causing severe rash involving mucus membranes or skin necrosis: [name] Has patient had a PCN reaction that required hospitalization yes Has patient had a PCN reaction occurring within the last 10 years: yes If all of the above answers are "[name]", then may proceed with Cephalosporin use.   Eggs Or Egg-Derived Products    Morphine Other (See Comments)    REACTION: hyper REACTION: hyper   Latex Rash   Thiethylperazine Other (See Comments) and Palpitations     Review of Systems:  [name] headache, visual changes, nausea, vomiting, diarrhea, constipation, dizziness, abdominal pain, skin rash, fevers, chills, night sweats, weight loss, swollen lymph nodes, body aches, joint swelling, chest pain, shortness of breath, mood changes.  POSITIVE muscle aches  Objective  Blood pressure 120/80, pulse 88, height 5\' 4"  (1.626 m), weight 231 lb (104.8 kg), last menstrual period 09/27/2016, SpO2 97 %.   General: [name] apparent distress alert and oriented x3 mood and affect normal, dressed appropriately.  HEENT: Pupils equal, extraocular movements intact  Respiratory: Patient's speak in full sentences and does not appear short of breath   Cardiovascular: [name] lower extremity edema, non tender, [name] erythema  Low back exam does have tightness noted.  Tender to palpation of the paraspinal musculature throughout.  Patient does have significant tightness with FABER test.  Decreased range of motion of the right hip secondary to the underlying arthritis. Patient does have some arthritic changes of the knees but not as severe.  Patient does have some atrophy of the VMO bilaterally.  Osteopathic findings  T9 extended rotated and side bent left L2 flexed rotated and side bent right Sacrum right on right       Assessment and Plan:  Degenerative arthritis of knee, bilateral Patient is having worsening pain of this exacerbation.  Discussed the possibility of steroid injections or possibly repeating the viscosupplementation.  We discussed that PRP.  Patient wants to hold at this time and will follow up with me again in 6 weeks  Chronic right hip pain Known arthritic changes.  We will need to continue to monitor.  Needs to have replacement.  BMI is under 40 so she should be a candidate.  Discussed potential referral to orthopedic surgery to discuss.  Lumbar arthritis Responding relatively well to osteopathic manipulation.  Discussed different treatment options.  Patient is on some chronic narcotics so we will need to be careful of what we potentially add.  Patient is also had some side effects to different medications so we will monitor as well.  Otherwise need to consider the possibility of epidurals.  Follow-up with me again in 6 weeks   Nonallopathic problems  Decision today to treat with OMT was based on Physical Exam  After verbal consent patient was treated with HVLA, ME, FPR techniques in  thoracic, lumbar, and sacral  areas  Patient tolerated the procedure well with improvement in symptoms  Patient given exercises, stretches and lifestyle modifications  See medications in patient instructions if given  Patient will follow  up in 4-8 weeks      The above documentation has been reviewed and is accurate and complete Lyndal Pulley, DO        Note: This dictation was prepared with Dragon dictation along with smaller phrase technology. Any transcriptional errors that result from this process are unintentional.

## 2022-01-09 ENCOUNTER — Encounter: Payer: Self-pay | Admitting: Family Medicine

## 2022-01-09 ENCOUNTER — Ambulatory Visit: Payer: 59 | Admitting: Family Medicine

## 2022-01-09 ENCOUNTER — Other Ambulatory Visit: Payer: Self-pay

## 2022-01-09 VITALS — BP 120/80 | HR 88 | Ht 64.0 in | Wt 231.0 lb

## 2022-01-09 DIAGNOSIS — M5136 Other intervertebral disc degeneration, lumbar region: Secondary | ICD-10-CM | POA: Diagnosis not present

## 2022-01-09 DIAGNOSIS — M9904 Segmental and somatic dysfunction of sacral region: Secondary | ICD-10-CM

## 2022-01-09 DIAGNOSIS — M1611 Unilateral primary osteoarthritis, right hip: Secondary | ICD-10-CM

## 2022-01-09 DIAGNOSIS — M25551 Pain in right hip: Secondary | ICD-10-CM | POA: Diagnosis not present

## 2022-01-09 DIAGNOSIS — M9902 Segmental and somatic dysfunction of thoracic region: Secondary | ICD-10-CM | POA: Diagnosis not present

## 2022-01-09 DIAGNOSIS — M170 Bilateral primary osteoarthritis of knee: Secondary | ICD-10-CM | POA: Diagnosis not present

## 2022-01-09 DIAGNOSIS — M9903 Segmental and somatic dysfunction of lumbar region: Secondary | ICD-10-CM

## 2022-01-09 DIAGNOSIS — G8929 Other chronic pain: Secondary | ICD-10-CM

## 2022-01-09 NOTE — Patient Instructions (Addendum)
PRP handout Dr. Rhona Raider referral See me again in 6 weeks

## 2022-01-09 NOTE — Assessment & Plan Note (Signed)
Patient is having worsening pain of this exacerbation.  Discussed the possibility of steroid injections or possibly repeating the viscosupplementation.  We discussed that PRP.  Patient wants to hold at this time and will follow up with me again in 6 weeks

## 2022-01-09 NOTE — Assessment & Plan Note (Signed)
Responding relatively well to osteopathic manipulation.  Discussed different treatment options.  Patient is on some chronic narcotics so we will need to be careful of what we potentially add.  Patient is also had some side effects to different medications so we will monitor as well.  Otherwise need to consider the possibility of epidurals.  Follow-up with me again in 6 weeks

## 2022-01-09 NOTE — Assessment & Plan Note (Signed)
Known arthritic changes.  We will need to continue to monitor.  Needs to have replacement.  BMI is under 40 so she should be a candidate.  Discussed potential referral to orthopedic surgery to discuss.

## 2022-02-08 ENCOUNTER — Ambulatory Visit: Payer: 59 | Admitting: Internal Medicine

## 2022-02-08 VITALS — BP 126/80 | HR 98 | Temp 98.8°F | Ht 64.0 in | Wt 236.2 lb

## 2022-02-08 DIAGNOSIS — H01002 Unspecified blepharitis right lower eyelid: Secondary | ICD-10-CM | POA: Diagnosis not present

## 2022-02-08 DIAGNOSIS — R1031 Right lower quadrant pain: Secondary | ICD-10-CM

## 2022-02-08 NOTE — Patient Instructions (Addendum)
I think this pain could  be  from your hip .  I want you to gtet back with  your orthopedist  ? New imaging.  ? ?I don't feel a hernia . Today . ? ?Eye uses warm compresses  and  gentle.  cleaning of lids  such as baby shampoo.  ? ?If get rash  or swelling   leg   then  contact us  for reevaluation.   ? ? ?
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
## 2022-02-16 ENCOUNTER — Other Ambulatory Visit: Payer: Self-pay | Admitting: Family Medicine

## 2022-02-16 DIAGNOSIS — M542 Cervicalgia: Secondary | ICD-10-CM

## 2022-02-16 NOTE — Progress Notes (Signed)
?Charlann Boxer D.O. ?[facility] ?[place] ?Phone: [phone] ?Subjective:   ?I, Lori [name], am serving as a scribe for Dr. Hulan Saas. ? ?This visit occurred during the SARS-CoV-2 public health emergency.  Safety protocols were in place, including screening questions prior to the visit, additional usage of staff PPE, and extensive cleaning of exam room while observing appropriate contact time as indicated for disinfecting solutions.  ? ?I'm seeing this patient by the request  of:  Koberlein, Steele Berg, MD ? ?CC: Back and knee pain, hip pain ? ?[id number]  ?KHLOE HUNKELE is a 56 y.o. female coming in with complaint of back and neck pain. OMT 01/09/2022. Patient states that her pain is worse since last visit. Spoke with Dr. Rhona Raider and she is suppose to get a hip replacement on R side.  Patient is still trying to hold off at this moment.  Knows that she does compensate and seems to give her more difficulty with her back. ? ?Medications patient has been prescribed: none ? ?Taking: ? ? ?  ? ? ? ? ?Reviewed prior external information including notes and imaging from previsou exam, outside providers and external EMR if available.  ? ?As well as notes that were available from care everywhere and other healthcare systems. ? ?Past medical history, social, surgical and family history all reviewed in electronic medical record.  No pertanent information unless stated regarding to the chief complaint.  ? ?Past Medical History:  ?Diagnosis Date  ? ALLERGIC RHINITIS 03/14/2010  ? BACK PAIN 01/24/2010  ? Buttock pain   ? Constipation   ? Diabetes ([place])   ? Fatty liver   ? GERD 12/01/2008  ? HBP (high blood pressure)   ? HIATAL HERNIA 05/27/2010  ? Hot flashes   ? Hyperlipidemia   ? Hypertrophy of tongue papillae 03/14/2010  ? IBS (irritable bowel syndrome)   ? Joint pain   ? Numbness and tingling of left upper and lower extremity   ? OSTEOARTHRITIS 12/01/2008  ? Osteoporosis 05/17/2020   ? Fragility fracture plus T score -1.8 on DEXA scan June 2021  ? Sciatica, left side   ? Sleep apnea   ? Sleep apnea   ? Thigh pain   ? 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  ? TOBACCO USE 12/01/2008  ? VITAMIN D DEFICIENCY 05/23/2010  ?  ?Allergies  ?Allergen Reactions  ? Amoxicillin Nausea Only  ?  Yeast Infection ?Has patient had a PCN reaction causing immediate rash, facial/tongue/throat swelling, SOB or lightheadedness with hypotension: no ?Has patient had a PCN reaction causing severe rash involving mucus membranes or skin necrosis: no ?Has patient had a PCN reaction that required hospitalization yes ?Has patient had a PCN reaction occurring within the last 10 years: yes ?If all of the above answers are "NO", then may proceed with Cephalosporin use. ? ?Yeast Infection ?Has patient had a PCN reaction causing immediate rash, facial/tongue/throat swelling, SOB or lightheadedness with hypotension: no ?Has patient had a PCN reaction causing severe rash involving mucus membranes or skin necrosis: no ?Has patient had a PCN reaction that required hospitalization yes ?Has patient had a PCN reaction occurring within the last 10 years: yes ?If all of the above answers are "NO", then may proceed with Cephalosporin use.  ? Eggs Or Egg-Derived Products   ? Morphine Other (See Comments)  ?  REACTION: hyper ?REACTION: hyper  ? Latex Rash  ? Thiethylperazine Other (See Comments) and Palpitations  ? ? ? ?Review of  Systems: ? No headache, visual changes, nausea, vomiting, diarrhea, constipation, dizziness, abdominal pain, skin rash, fevers, chills, night sweats, weight loss, swollen lymph nodes, body aches, joint swelling, chest pain, shortness of breath, mood changes. POSITIVE muscle aches ? ?Objective  ?Blood pressure 122/72, height '5\' 4"'$  (1.626 m), weight 230 lb (104.3 kg), last menstrual period 09/27/2016. ?  ?General: No apparent distress alert and oriented x3 mood and affect normal, dressed appropriately.  ?HEENT: Pupils equal,  extraocular movements intact  ?Respiratory: Patient's speak in full sentences and does not appear short of breath  ?Cardiovascular: No lower extremity edema, non tender, no erythema  ?Patient's neck exam significant tightness noted bilaterally.  Patient does have some arthritic changes noted with crepitus.  5 out of 5 strength of the upper extremities.  Severe tightness noted in the upper back today.  Low back also has limited range of motion in all planes.  Only has 5 degrees of extension. ? ?Osteopathic findings ? ?C3 flexed rotated and side bent right ?C5 flexed rotated and side bent left ?T9 extended rotated and side bent left with inhaled rib ?L1 flexed rotated and side bent right ?Sacrum right on right ?Pelvic shear noted ? ? ? ?  ?Assessment and Plan: ? ?Primary osteoarthritis of right hip ?Chronic problem with exacerbation.  I do think compensation is causing more of the back pain.  Does have the muscle relaxers as needed.  Responding well to osteopathic manipulation today.  Follow-up again in 4 to 6 weeks. ? ?Lumbar arthritis ?Chronic problem with exacerbation.  Encourage patient to continue to work on weight loss.  Discussed which activities to doing which wants to avoid.  Increase activity as tolerated.  Follow-up again 6 weeks ?  ? ?Nonallopathic problems ? ?Decision today to treat with OMT was based on Physical Exam ? ?After verbal consent patient was treated with HVLA, ME, FPR techniques in cervical, rib, thoracic, lumbar, and sacral  areas ? ?Patient tolerated the procedure well with improvement in symptoms ? ?Patient given exercises, stretches and lifestyle modifications ? ?See medications in patient instructions if given ? ?Patient will follow up in 4-8 weeks ? ?  ? ?The above documentation has been reviewed and is accurate and complete Lyndal Pulley, DO ? ? ? ?  ? ? Note: This dictation was prepared with Dragon dictation along with smaller phrase technology. Any transcriptional errors that result  from this process are unintentional.    ?  ?  ? ?

## 2022-02-17 MED ORDER — DIAZEPAM 5 MG PO TABS: ORAL_TABLET | ORAL | 0 refills | Status: DC

## 2022-02-20 ENCOUNTER — Ambulatory Visit: Payer: 59 | Admitting: Family Medicine

## 2022-02-20 ENCOUNTER — Other Ambulatory Visit: Payer: Self-pay | Admitting: Family Medicine

## 2022-02-20 ENCOUNTER — Other Ambulatory Visit: Payer: Self-pay

## 2022-02-20 DIAGNOSIS — M542 Cervicalgia: Secondary | ICD-10-CM

## 2022-02-20 DIAGNOSIS — M9902 Segmental and somatic dysfunction of thoracic region: Secondary | ICD-10-CM

## 2022-02-20 DIAGNOSIS — M1611 Unilateral primary osteoarthritis, right hip: Secondary | ICD-10-CM | POA: Diagnosis not present

## 2022-02-20 DIAGNOSIS — M9903 Segmental and somatic dysfunction of lumbar region: Secondary | ICD-10-CM | POA: Diagnosis not present

## 2022-02-20 DIAGNOSIS — M9904 Segmental and somatic dysfunction of sacral region: Secondary | ICD-10-CM

## 2022-02-20 DIAGNOSIS — M5136 Other intervertebral disc degeneration, lumbar region: Secondary | ICD-10-CM | POA: Diagnosis not present

## 2022-02-20 DIAGNOSIS — M9908 Segmental and somatic dysfunction of rib cage: Secondary | ICD-10-CM | POA: Diagnosis not present

## 2022-02-20 DIAGNOSIS — M9901 Segmental and somatic dysfunction of cervical region: Secondary | ICD-10-CM | POA: Diagnosis not present

## 2022-02-20 MED ORDER — DIAZEPAM 5 MG PO TABS: ORAL_TABLET | ORAL | 0 refills | Status: DC

## 2022-02-20 NOTE — Patient Instructions (Signed)
Good to see you! ?If side of hip keeps giving difficulty lets see you again in 4-6 weeks ? ?

## 2022-02-20 NOTE — Assessment & Plan Note (Signed)
Chronic problem with exacerbation.  Encourage patient to continue to work on weight loss.  Discussed which activities to doing which wants to avoid.  Increase activity as tolerated.  Follow-up again 6 weeks ?

## 2022-02-20 NOTE — Assessment & Plan Note (Signed)
Chronic problem with exacerbation.  I do think compensation is causing more of the back pain.  Does have the muscle relaxers as needed.  Responding well to osteopathic manipulation today.  Follow-up again in 4 to 6 weeks. ?

## 2022-03-10 ENCOUNTER — Encounter: Payer: Self-pay | Admitting: Family Medicine

## 2022-03-10 ENCOUNTER — Ambulatory Visit: Payer: 59 | Admitting: Family Medicine

## 2022-03-10 VITALS — BP 124/70 | HR 65 | Temp 98.1°F | Ht 64.0 in | Wt 226.6 lb

## 2022-03-10 DIAGNOSIS — E1169 Type 2 diabetes mellitus with other specified complication: Secondary | ICD-10-CM | POA: Diagnosis not present

## 2022-03-10 DIAGNOSIS — E785 Hyperlipidemia, unspecified: Secondary | ICD-10-CM

## 2022-03-10 DIAGNOSIS — R5383 Other fatigue: Secondary | ICD-10-CM

## 2022-03-10 DIAGNOSIS — I872 Venous insufficiency (chronic) (peripheral): Secondary | ICD-10-CM | POA: Diagnosis not present

## 2022-03-10 DIAGNOSIS — H6592 Unspecified nonsuppurative otitis media, left ear: Secondary | ICD-10-CM

## 2022-03-10 DIAGNOSIS — I10 Essential (primary) hypertension: Secondary | ICD-10-CM

## 2022-03-10 DIAGNOSIS — Z6838 Body mass index (BMI) 38.0-38.9, adult: Secondary | ICD-10-CM

## 2022-03-10 DIAGNOSIS — E1165 Type 2 diabetes mellitus with hyperglycemia: Secondary | ICD-10-CM | POA: Diagnosis not present

## 2022-03-10 MED ORDER — DOXYCYCLINE HYCLATE 100 MG PO TABS: 100.0000 mg | ORAL_TABLET | Freq: Two times a day (BID) | ORAL | 0 refills | Status: DC

## 2022-03-10 MED ORDER — FLUCONAZOLE 150 MG PO TABS: 150.0000 mg | ORAL_TABLET | Freq: Once | ORAL | 0 refills | Status: AC

## 2022-03-10 MED ORDER — AMOXICILLIN-POT CLAVULANATE 875-125 MG PO TABS: 1.0000 | ORAL_TABLET | Freq: Two times a day (BID) | ORAL | 0 refills | Status: DC

## 2022-03-10 MED ORDER — TIRZEPATIDE 2.5 MG/0.5ML ~~LOC~~ SOAJ: 2.5000 mg | SUBCUTANEOUS | 2 refills | Status: DC

## 2022-03-10 NOTE — Patient Instructions (Addendum)
Dr. [place], Dr. Marcell Anger ? ?If any worsening of breathing, coughing ?

## 2022-03-10 NOTE — Progress Notes (Signed)
?Lori [name] ?DOB: August 02, 1966 ?Encounter date: 03/10/2022 ? ?This is a 56 y.o. female who presents with ?Chief Complaint  ?Patient presents with  ? Follow-up  ? ? ?History of present illness: ?Had bronchitis about 3 weeks ago. Took doxy for eye infection and through she was getting better, but then 2 days ago started feeling bad again. Yesterday head hurting so badly, ears hurting, coughing, wheezing so she went to urgent care - given medrol pack, mucinex, tessalon, albuterol inhaler. Doesn't feel great today. Woke at 2am coughing. Did do cxr and she didn't hear from them about that. Feels that right side of chest is more congested. She is concerned that she has pneumonia. Completed doxycycline for eye on march 18th. Went back to work last week.  ? ?Legs, groin have been killing her. Pain doctor got her the lidocaine patches, which help some. Taking oxycodone 5 times/day. She can feel when legs are going to flare and it is wearing her down. Bursitis right hip, sciatica left side. Planning facet injections in may. Dr. Tamala Julian sent her to Dr. Novella Olive. Labrum right hip torn. She would like to lose weight before surgery, but surgeon would operate on her.  ? ?Feet burning all the time - asking about venous stasis dermatitis; she recalls having US done in the past and being dx with reflux in veins. Wondering about seeing vein specialist.  ? ?DM: appetite not great, not eating like she should. Trying to remind herself to get up and move regularly through work day.  ? ? ?Allergies  ?Allergen Reactions  ? Amoxicillin Nausea Only  ?  Yeast Infection ?Has patient had a PCN reaction causing immediate rash, facial/tongue/throat swelling, SOB or lightheadedness with hypotension: no ?Has patient had a PCN reaction causing severe rash involving mucus membranes or skin necrosis: no ?Has patient had a PCN reaction that required hospitalization yes ?Has patient had a PCN reaction occurring within the last 10 years: yes ?If all of the  above answers are "NO", then may proceed with Cephalosporin use. ? ?Yeast Infection ?Has patient had a PCN reaction causing immediate rash, facial/tongue/throat swelling, SOB or lightheadedness with hypotension: no ?Has patient had a PCN reaction causing severe rash involving mucus membranes or skin necrosis: no ?Has patient had a PCN reaction that required hospitalization yes ?Has patient had a PCN reaction occurring within the last 10 years: yes ?If all of the above answers are "NO", then may proceed with Cephalosporin use.  ? Eggs Or Egg-Derived Products   ? Morphine Other (See Comments)  ?  REACTION: hyper ?REACTION: hyper  ? Latex Rash  ? Thiethylperazine Other (See Comments) and Palpitations  ? ?Current Meds  ?Medication Sig  ? albuterol (VENTOLIN HFA) 108 (90 Base) MCG/ACT inhaler Inhale into the lungs.  ? benzonatate (TESSALON) 200 MG capsule Take by mouth.  ? cetirizine (ZYRTEC) 10 MG tablet Take 10 mg by mouth at bedtime.  ? diazepam (VALIUM) 5 MG tablet TAKE 1 TABLET BY MOUTH EVERY 12 HOURS AS NEEDED FOR ANXIETY OR MUSCLE SPASMS Strength: 5 mg  ? fluticasone (FLONASE) 50 MCG/ACT nasal spray Place 2 sprays into both nostrils daily.  ? guaiFENesin (MUCINEX) 600 MG 12 hr tablet Take by mouth 2 (two) times daily.  ? hydrochlorothiazide (HYDRODIURIL) 25 MG tablet TAKE 1 TABLET (25 MG TOTAL) BY MOUTH DAILY.  ? ketoconazole (NIZORAL) 2 % cream Apply topically 2 (two) times daily as needed for irritation.  ? lidocaine (LIDODERM) 5 % Place 1 patch onto the skin  daily. Remove & Discard patch within 12 hours or as directed by MD  ? losartan (COZAAR) 50 MG tablet TAKE 1 TABLET BY MOUTH EVERY DAY  ? methylPREDNISolone (MEDROL DOSEPAK) 4 MG TBPK tablet Take by mouth.  ? oxyCODONE-acetaminophen (PERCOCET) 10-325 MG tablet Take 1 tablet by mouth 5 (five) times daily as needed.  ? rosuvastatin (CRESTOR) 10 MG tablet TAKE 1 TABLET BY MOUTH EVERY DAY  ? ?Current Facility-Administered Medications for the 03/10/22 encounter  (Office Visit) with Caren Macadam, MD  ?Medication  ? 0.9 %  sodium chloride infusion  ? ? ?Review of Systems  ?Constitutional:  Negative for chills and fever.  ?HENT:  Positive for congestion, postnasal drip, sinus pressure, sinus pain and sore throat. Negative for ear pain.   ?Respiratory:  Positive for cough, shortness of breath and wheezing.   ?Cardiovascular:  Negative for chest pain.  ? ?Objective: ? ?BP 124/70 (BP Location: Left Arm, Patient Position: Sitting, Cuff Size: Large)   Pulse 65   Temp 98.1 ?F (36.7 ?C) (Oral)   Ht '5\' 4"'$  (1.626 m)   Wt 226 lb 9.6 oz (102.8 kg)   LMP 09/27/2016 (Approximate)   SpO2 94%   BMI 38.90 kg/m?   Weight: 226 lb 9.6 oz (102.8 kg)  ? ?BP Readings from Last 3 Encounters:  ?03/10/22 124/70  ?02/20/22 122/72  ?02/08/22 126/80  ? ?Wt Readings from Last 3 Encounters:  ?03/10/22 226 lb 9.6 oz (102.8 kg)  ?02/20/22 230 lb (104.3 kg)  ?02/08/22 236 lb 3.2 oz (107.1 kg)  ? ? ?Physical Exam ?Constitutional:   ?   General: She is not in acute distress. ?   Appearance: She is well-developed.  ?HENT:  ?   Head: Normocephalic and atraumatic.  ?   Right Ear: Tympanic membrane, ear canal and external ear normal. Right ear erythematous TM: dull TM.  ?   Left Ear: Ear canal and external ear normal. A middle ear effusion is present. Tympanic membrane is erythematous.  ?   Ears:  ? ?   Nose: Mucosal edema present.  ?   Right Sinus: Maxillary sinus tenderness and frontal sinus tenderness present.  ?   Left Sinus: Maxillary sinus tenderness and frontal sinus tenderness present.  ?   Mouth/Throat:  ?   Pharynx: Uvula midline. Posterior oropharyngeal erythema present.  ?Cardiovascular:  ?   Rate and Rhythm: Normal rate and regular rhythm.  ?Pulmonary:  ?   Effort: Pulmonary effort is normal. No respiratory distress.  ?   Breath sounds: Decreased breath sounds and wheezing (end exp) present. No rhonchi or rales.  ?   Comments: Frequent cough ? ? ?Assessment/Plan ? ?1. Venous  reflux ?Patient interested in vascular evaluation; told in past she had venousreflux. She does have some stasis dermatitis, although distribution is atypical. ?- Ambulatory referral to Vascular Surgery ? ?2. Controlled type 2 diabetes mellitus with hyperglycemia, without long-term current use of insulin ([place]) ?Discussed treatment options for diabetes.  Her goal is also to lose weight, and an effort to help with joint pain.  I feel that [facility] would be the best choice for her to help with all of the above. ?- tirzepatide [facility]) 2.5 MG/0.5ML Pen; Inject 2.5 mg into the skin once a week.  Dispense: 2 mL; Refill: 2 ?- Hemoglobin A1c; Future ?- Microalbumin / creatinine urine ratio; Future ? ?3. Hyperlipidemia associated with type 2 diabetes mellitus ([place]) ?Continue with Crestor 10 mg daily.  Recheck blood work today.  LDL goal  less than 50. ?- Lipid panel; Future ? ?4. Primary hypertension ?Blood pressure well controlled.  Continue with losartan 50 mg, hydrochlorothiazide 25 mg. ?- Comprehensive metabolic panel; Future ?- CBC with Differential/Platelet; Future ? ?5. Fatigue, unspecified type ?- TSH; Future ?- T4, free; Future ?- T3, free; Future ? ?6. Class 2 severe obesity due to excess calories with serious comorbidity and body mass index (BMI) of 38.0 to 38.9 in adult [facility]) ?See above.  Between insulin resistance, lack of ability to be active secondary to multiple joint issues as well as back pain, diabetes I do feel patient will benefit most from [facility] in terms of sugar control as well as potential weight loss. Discussed new medication(s) today with patient. Discussed potential side effects and patient verbalized understanding.  ?- tirzepatide [facility]) 2.5 MG/0.5ML Pen; Inject 2.5 mg into the skin once a week.  Dispense: 2 mL; Refill: 2 ? ?7. Otitis media with effusion, left ?Treating with Augmentin.  Continue with other treatments from urgent care.  Let me know if not improving after antibiotic  treatment. ?- amoxicillin-clavulanate (AUGMENTIN) 875-125 MG tablet; Take 1 tablet by mouth 2 (two) times daily.  Dispense: 20 tablet; Refill: 0 ? ?Return in about 6 weeks (around 04/21/2022) for Chronic condition visit. ?45 minute

## 2022-03-13 ENCOUNTER — Encounter: Payer: Self-pay | Admitting: Family Medicine

## 2022-03-14 MED ORDER — LEVOFLOXACIN 750 MG PO TABS: 750.0000 mg | ORAL_TABLET | Freq: Every day | ORAL | 0 refills | Status: DC

## 2022-03-20 NOTE — Progress Notes (Signed)
?Charlann Boxer D.O. ?[facility] ?[place] ?Phone: [phone] ?Subjective:   ?I, Jacqualin Combes, am serving as a scribe for Dr. Hulan Saas. ? ?This visit occurred during the SARS-CoV-2 public health emergency.  Safety protocols were in place, including screening questions prior to the visit, additional usage of staff PPE, and extensive cleaning of exam room while observing appropriate contact time as indicated for disinfecting solutions.  ? ? ?I'm seeing this patient by the request  of:  Koberlein, Steele Berg, MD ? ?CC: Neck and back pain follow-up ? ?ALP:[id number]  ?Lori [name] is a 56 y.o. female coming in with complaint of back and neck pain. OMT 02/20/2022. Patient states that she has not been feeling well since last visit. Was on medication to prevent pneumonia. Back and neck are achy due to dry heaving.  More back than  neck pain. ? ?Medications patient has been prescribed: None ? ?Taking: ? ? ?  ? ? ? ? ?Reviewed prior external information including notes and imaging from previsou exam, outside providers and external EMR if available.  ? ?As well as notes that were available from care everywhere and other healthcare systems. ? ?Past medical history, social, surgical and family history all reviewed in electronic medical record.  No pertanent information unless stated regarding to the chief complaint.  ? ?Past Medical History:  ?Diagnosis Date  ? ALLERGIC RHINITIS 03/14/2010  ? BACK PAIN 01/24/2010  ? Buttock pain   ? Constipation   ? Diabetes ([place])   ? Fatty liver   ? GERD 12/01/2008  ? HBP (high blood pressure)   ? HIATAL HERNIA 05/27/2010  ? Hot flashes   ? Hyperlipidemia   ? Hypertrophy of tongue papillae 03/14/2010  ? IBS (irritable bowel syndrome)   ? Joint pain   ? Numbness and tingling of left upper and lower extremity   ? OSTEOARTHRITIS 12/01/2008  ? Osteoporosis 05/17/2020  ? Fragility fracture plus T score -1.8 on DEXA scan June 2021  ? Sciatica, left side   ?  Sleep apnea   ? Sleep apnea   ? Thigh pain   ? 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  ? TOBACCO USE 12/01/2008  ? VITAMIN D DEFICIENCY 05/23/2010  ?  ?Allergies  ?Allergen Reactions  ? Amoxicillin Nausea And Vomiting  ?  Yeast Infection, severe nausea and vomiting ?  ? Eggs Or Egg-Derived Products   ? Morphine Other (See Comments)  ?  REACTION: hyper ?REACTION: hyper  ? Latex Rash  ? Thiethylperazine Other (See Comments) and Palpitations  ? ? ? ?Review of Systems: ? No headache, visual changes, nausea, vomiting, diarrhea, constipation, dizziness, abdominal pain, skin rash, fevers, chills, night sweats, weight loss, swollen lymph nodes, body aches, joint swelling, chest pain, shortness of breath, mood changes. POSITIVE muscle aches ? ?Objective  ?Blood pressure 110/64, pulse 98, height '5\' 4"'$  (1.626 m), weight 222 lb (100.7 kg), last menstrual period 09/27/2016, SpO2 97 %. ?  ?General: No apparent distress alert and oriented x3 mood and affect normal, dressed appropriately.  ?HEENT: Pupils equal, extraocular movements intact  ?Respiratory: Very mild wheeze noted today.  Pulse ox is normal.  Pulses normal as well as blood pressure.  Patient is able to speak in full sentences. ?Cardiovascular: No lower extremity edema, non tender, no erythema  ? ?Osteopathic findings ? ?T2 flexed rotated and side bent left with inhaled rib ?T3 extended rotated and side bent right inhaled rib ?T6 extended rotated and side bent left ?L2  flexed rotated and side bent right ?Sacrum right on right ? ? ? ? ?  ?Assessment and Plan: ? ?Degenerative disc disease, cervical ?Significant postsurgical changes in multiple different areas.  Discussed with patient about icing regimen and home exercises, which activities to do which wants to avoid.  Patient felt better after the manipulation.  Continues to have the significant cough.  We will do chest x-ray to make sure nothing else is contributing to it at the moment.  Patient knows if any worsening symptoms with  cough to seek medical attention immediately.  Follow-up with me again in 6 to 8 weeks patient is on chronic narcotics from another provider.  ? ?Nonallopathic problems ? ?Decision today to treat with OMT was based on Physical Exam ? ?After verbal consent patient was treated with HVLA, ME, FPR techniques in she rib, thoracic, lumbar, and sacral  areas ? ?Patient tolerated the procedure well with improvement in symptoms ? ?Patient given exercises, stretches and lifestyle modifications ? ?See medications in patient instructions if given ? ?Patient will follow up in 4-8 weeks ? ?  ? ? ?The above documentation has been reviewed and is accurate and complete Lyndal Pulley, DO ? ? ? ?  ? ? Note: This dictation was prepared with Dragon dictation along with smaller phrase technology. Any transcriptional errors that result from this process are unintentional.    ?  ?  ? ?

## 2022-03-21 ENCOUNTER — Ambulatory Visit: Payer: 59 | Admitting: Family Medicine

## 2022-03-21 ENCOUNTER — Ambulatory Visit (INDEPENDENT_AMBULATORY_CARE_PROVIDER_SITE_OTHER): Payer: 59

## 2022-03-21 VITALS — BP 110/64 | HR 98 | Ht 64.0 in | Wt 222.0 lb

## 2022-03-21 DIAGNOSIS — R079 Chest pain, unspecified: Secondary | ICD-10-CM

## 2022-03-21 DIAGNOSIS — M9903 Segmental and somatic dysfunction of lumbar region: Secondary | ICD-10-CM

## 2022-03-21 DIAGNOSIS — M5030 Other cervical disc degeneration, unspecified cervical region: Secondary | ICD-10-CM | POA: Diagnosis not present

## 2022-03-21 DIAGNOSIS — M9908 Segmental and somatic dysfunction of rib cage: Secondary | ICD-10-CM | POA: Diagnosis not present

## 2022-03-21 DIAGNOSIS — M9904 Segmental and somatic dysfunction of sacral region: Secondary | ICD-10-CM | POA: Diagnosis not present

## 2022-03-21 DIAGNOSIS — M9902 Segmental and somatic dysfunction of thoracic region: Secondary | ICD-10-CM | POA: Diagnosis not present

## 2022-03-21 NOTE — Assessment & Plan Note (Signed)
Significant postsurgical changes in multiple different areas.  Discussed with patient about icing regimen and home exercises, which activities to do which wants to avoid.  Patient felt better after the manipulation.  Continues to have the significant cough.  We will do chest x-ray to make sure nothing else is contributing to it at the moment.  Patient knows if any worsening symptoms with cough to seek medical attention immediately.  Follow-up with me again in 6 to 8 weeks patient is on chronic narcotics from another provider. ?

## 2022-03-21 NOTE — Patient Instructions (Addendum)
Good to see you  ?Chest x ray on the way out  ?Flonase 1 spray in each nostril daily for two weeks ?Zyrtec '10mg'$  at night ?If chest x ray shows anything I will contact you through Mychart  ?Any worsening shortening of breath seek medical attention or contact primary care ?See me again in 6-8 weeks  ?

## 2022-03-29 ENCOUNTER — Telehealth: Payer: Self-pay | Admitting: *Deleted

## 2022-03-29 NOTE — Telephone Encounter (Signed)
Left message for patient to call back to schedule follow up lung cancer screening CT scan.  

## 2022-04-03 ENCOUNTER — Encounter: Payer: Self-pay | Admitting: Family Medicine

## 2022-04-10 NOTE — Progress Notes (Signed)
? ?ACUTE VISIT ?Chief Complaint  ?Patient presents with  ? fluid in ears  ?  Both ears, left is worse; started 3/10. Has already been on a round of abx.   ? ?HPI: ?Lori [name] is a 56 y.o. female with hx of chronic pain,tobacco use disorder,OSA on CPAP ,HTN,and DM II here today complaining of persistent earache L>R. ?Mild sore throat sometimes in the morning, attributed to mouth breathing. ? ?Otalgia  ?There is pain in both ears. This is a recurrent problem. The current episode started more than 1 month ago. The problem occurs constantly. The problem has been gradually improving. There has been no fever. The pain is moderate. Associated symptoms include coughing (No more than usual), headaches (Frontal pressure, mild), hearing loss and a sore throat (occasionally.). Pertinent negatives include no abdominal pain, ear discharge, neck pain, rash or rhinorrhea. She has tried antibiotics for the symptoms. The treatment provided mild relief. Her past medical history is significant for hearing loss.  ?Left decreased hearing, "pop" relieves it temporarily.It has improved and closer to her baseline. Hx of hearing loss. ? ?Since symptoms onset she has been prescribed Augmentin, Levaquin,and doxycycline. She also tried Methylprednisolone. ?She has also tried Zyrtec,sudafed,and Flonase nasal spray. ?  ?Review of Systems  ?Constitutional:  Negative for activity change and appetite change.  ?HENT:  Positive for ear pain, hearing loss and sore throat (occasionally.). Negative for ear discharge, facial swelling and rhinorrhea.   ?Respiratory:  Positive for cough (No more than usual). Negative for shortness of breath and wheezing.   ?Cardiovascular:  Negative for chest pain.  ?Gastrointestinal:  Negative for abdominal pain.  ?Musculoskeletal:  Negative for neck pain.  ?Skin:  Negative for rash.  ?Allergic/Immunologic: Positive for environmental allergies.  ?Neurological:  Positive for headaches (Frontal pressure, mild).  Negative for syncope and weakness.  ?Rest see pertinent positives and negatives per HPI. ? ?Current Outpatient Medications on File Prior to Visit  ?Medication Sig Dispense Refill  ? cetirizine (ZYRTEC) 10 MG tablet Take 10 mg by mouth at bedtime.    ? diazepam (VALIUM) 5 MG tablet TAKE 1 TABLET BY MOUTH EVERY 12 HOURS AS NEEDED FOR ANXIETY OR MUSCLE SPASMS Strength: 5 mg 60 tablet 0  ? fluticasone (FLONASE) 50 MCG/ACT nasal spray Place 2 sprays into both nostrils daily.    ? hydrochlorothiazide (HYDRODIURIL) 25 MG tablet TAKE 1 TABLET (25 MG TOTAL) BY MOUTH DAILY. 90 tablet 1  ? lidocaine (LIDODERM) 5 % Place 1 patch onto the skin daily. Remove & Discard patch within 12 hours or as directed by MD    ? losartan (COZAAR) 50 MG tablet TAKE 1 TABLET BY MOUTH EVERY DAY 90 tablet 1  ? meloxicam (MOBIC) 15 MG tablet Take 15 mg by mouth daily.    ? oxyCODONE-acetaminophen (PERCOCET) 10-325 MG tablet Take 1 tablet by mouth 5 (five) times daily as needed.    ? rosuvastatin (CRESTOR) 10 MG tablet TAKE 1 TABLET BY MOUTH EVERY DAY 90 tablet 1  ? ?Current Facility-Administered Medications on File Prior to Visit  ?Medication Dose Route Frequency Provider Last Rate Last Admin  ? 0.9 %  sodium chloride infusion  500 mL Intravenous Once Thornton Park, MD      ? ?Past Medical History:  ?Diagnosis Date  ? ALLERGIC RHINITIS 03/14/2010  ? BACK PAIN 01/24/2010  ? Buttock pain   ? Constipation   ? Diabetes ([place])   ? Fatty liver   ? GERD 12/01/2008  ? HBP (high blood pressure)   ?  HIATAL HERNIA 05/27/2010  ? Hot flashes   ? Hyperlipidemia   ? Hypertrophy of tongue papillae 03/14/2010  ? IBS (irritable bowel syndrome)   ? Joint pain   ? Numbness and tingling of left upper and lower extremity   ? OSTEOARTHRITIS 12/01/2008  ? Osteoporosis 05/17/2020  ? Fragility fracture plus T score -1.8 on DEXA scan June 2021  ? Sciatica, left side   ? Sleep apnea   ? Sleep apnea   ? Thigh pain   ? 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  ? TOBACCO USE 12/01/2008  ? VITAMIN D  DEFICIENCY 05/23/2010  ? ?Allergies  ?Allergen Reactions  ? Amoxicillin Nausea And Vomiting  ?  Yeast Infection, severe nausea and vomiting ?  ? Eggs Or Egg-Derived Products   ? Morphine Other (See Comments)  ?  REACTION: hyper ?REACTION: hyper  ? Latex Rash  ? Thiethylperazine Other (See Comments) and Palpitations  ? ?Social History  ? ?Socioeconomic History  ? Marital status: Married  ?  Spouse name: Alver Sorrow  ? Number of children: 1  ? Years of education: Not on file  ? Highest education level: Some college, no degree  ?Occupational History  ? 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 Smoking status: Every Day  ?  Packs/day: 1.00  ?  Years: 36.00  ?  Pack years: 36.00  ?  Types: Cigarettes  ? Smokeless tobacco: Never  ?Vaping Use  ? Vaping Use: Former  ? Devices: tried them for a month or 2  ?Substance and Sexual Activity  ? Alcohol use: Yes  ?  Comment: social  ? Drug use: No  ? Sexual activity: Not on file  ?Other Topics Concern  ? Not on file  ?Social History Narrative  ? Not on file  ? ?Social Determinants of Health  ? ?Financial Resource Strain: Unknown  ? Difficulty of Paying Living Expenses: Patient refused  ?Food Insecurity: Unknown  ? Worried About Charity fundraiser in the Last Year: Patient refused  ? Ran Out of Food in the Last Year: Patient refused  ?Transportation Needs: No Transportation Needs  ? Lack of Transportation (Medical): No  ? Lack of Transportation (Non-Medical): No  ?Physical Activity: Unknown  ? Days of Exercise per Week: 0 days  ? Minutes of Exercise per Session: Not on file  ?Stress: Stress Concern Present  ? Feeling of Stress : Rather much  ?Social Connections: Moderately Integrated  ? Frequency of Communication with Friends and Family: Three times a week  ? Frequency of Social Gatherings with Friends and Family: Patient refused  ? Attends Religious Services: More than 4 times per year  ? Active Member of Clubs or Organizations: No  ? Attends Theatre manager Meetings: Not on file  ? Marital Status: Married  ? ?Vitals:  ? 04/11/22 1534  ?BP: 124/70  ?Pulse: 92  ?Resp: 16  ?Temp: 98.1 ?F (36.7 ?C)  ?SpO2: 97%  ? ?Body mass index is 38.36 kg/m?. ? ?Physical Exam ?Vitals and nursing note reviewed.  ?Constitutional:   ?   General: She is not in acute distress. ?   Appearance: She is well-developed. She is not ill-appearing.  ?HENT:  ?   Head: Normocephalic and atraumatic.  ?   Right Ear: Ear canal and external ear normal. A middle ear effusion is present. Tympanic membrane is not bulging.  ?   Left Ear: External ear normal. A middle ear effusion is present. Tympanic membrane is  bulging.  ?   Mouth/Throat:  ?   Mouth: Mucous membranes are moist.  ?   Pharynx: Oropharynx is clear.  ?Eyes:  ?   Conjunctiva/sclera: Conjunctivae normal.  ?Cardiovascular:  ?   Rate and Rhythm: Normal rate and regular rhythm.  ?   Heart sounds: No murmur heard. ?Pulmonary:  ?   Effort: Pulmonary effort is normal. No respiratory distress.  ?   Breath sounds: Normal breath sounds. No stridor.  ?Lymphadenopathy:  ?   Head:  ?   Right side of head: No submandibular, preauricular or posterior auricular adenopathy.  ?   Left side of head: No submandibular, preauricular or posterior auricular adenopathy.  ?   Cervical: No cervical adenopathy.  ?Skin: ?   General: Skin is warm.  ?   Findings: No erythema or rash.  ?Neurological:  ?   Mental Status: She is alert and oriented to person, place, and time.  ?Psychiatric:     ?   Speech: Speech normal.  ?   Comments: Well groomed, good eye contact.  ? ?ASSESSMENT AND PLAN: ? ?Ms.Mackenze was seen today for fluid in ears. ? ?Diagnoses and all orders for this visit: ?Orders Placed This Encounter  ?Procedures  ? Ambulatory referral to ENT  ? ?Dysfunction of left eustachian tube ?Left middle ear effusion. ?Continue auto inflation maneuvers. ?OTC decongestants may also help, some side effects discussed. ?May need tympanotomy and tube placement. ENT referral  placed. ?Smoking cessation encouraged. ? ?Hearing loss of left ear, unspecified hearing loss type ?Reporting hx of hearing loss due to noise exposure, closer to ehr baseline. She is not interested in hearing

## 2022-04-11 ENCOUNTER — Other Ambulatory Visit (INDEPENDENT_AMBULATORY_CARE_PROVIDER_SITE_OTHER): Payer: 59

## 2022-04-11 ENCOUNTER — Ambulatory Visit: Payer: 59 | Admitting: Family Medicine

## 2022-04-11 ENCOUNTER — Encounter: Payer: Self-pay | Admitting: Family Medicine

## 2022-04-11 VITALS — BP 124/70 | HR 92 | Temp 98.1°F | Resp 16 | Ht 64.0 in | Wt 223.5 lb

## 2022-04-11 DIAGNOSIS — E785 Hyperlipidemia, unspecified: Secondary | ICD-10-CM

## 2022-04-11 DIAGNOSIS — I10 Essential (primary) hypertension: Secondary | ICD-10-CM | POA: Diagnosis not present

## 2022-04-11 DIAGNOSIS — E1165 Type 2 diabetes mellitus with hyperglycemia: Secondary | ICD-10-CM | POA: Diagnosis not present

## 2022-04-11 DIAGNOSIS — E1169 Type 2 diabetes mellitus with other specified complication: Secondary | ICD-10-CM

## 2022-04-11 DIAGNOSIS — H9203 Otalgia, bilateral: Secondary | ICD-10-CM | POA: Diagnosis not present

## 2022-04-11 DIAGNOSIS — H6982 Other specified disorders of Eustachian tube, left ear: Secondary | ICD-10-CM

## 2022-04-11 DIAGNOSIS — R5383 Other fatigue: Secondary | ICD-10-CM

## 2022-04-11 DIAGNOSIS — H9192 Unspecified hearing loss, left ear: Secondary | ICD-10-CM

## 2022-04-11 DIAGNOSIS — H6992 Unspecified Eustachian tube disorder, left ear: Secondary | ICD-10-CM

## 2022-04-11 LAB — COMPREHENSIVE METABOLIC PANEL
ALT: 39 U/L — ABNORMAL HIGH (ref 0–35)
AST: 35 U/L (ref 0–37)
Albumin: 4.5 g/dL (ref 3.5–5.2)
Alkaline Phosphatase: 67 U/L (ref 39–117)
BUN: 18 mg/dL (ref 6–23)
CO2: 30 mEq/L (ref 19–32)
Calcium: 9.9 mg/dL (ref 8.4–10.5)
Chloride: 98 mEq/L (ref 96–112)
Creatinine, Ser: 0.85 mg/dL (ref 0.40–1.20)
GFR: 76.19 mL/min (ref >60.00)
Glucose, Bld: 116 mg/dL — ABNORMAL HIGH (ref 70–99)
Potassium: 3.6 mEq/L (ref 3.5–5.1)
Sodium: 136 mEq/L (ref 135–145)
Total Bilirubin: 0.4 mg/dL (ref 0.2–1.2)
Total Protein: 7.3 g/dL (ref 6.0–8.3)

## 2022-04-11 LAB — CBC WITH DIFFERENTIAL/PLATELET
Basophils Absolute: 0.1 10*3/uL (ref 0.0–0.1)
Basophils Relative: 0.9 % (ref 0.0–3.0)
Eosinophils Absolute: 0.2 10*3/uL (ref 0.0–0.7)
Eosinophils Relative: 2.4 % (ref 0.0–5.0)
HCT: 41.9 % (ref 36.0–46.0)
Hemoglobin: 14.2 g/dL (ref 12.0–15.0)
Lymphocytes Relative: 55.3 % — ABNORMAL HIGH (ref 12.0–46.0)
Lymphs Abs: 4.9 10*3/uL — ABNORMAL HIGH (ref 0.7–4.0)
MCHC: 34 g/dL (ref 30.0–36.0)
MCV: 91.9 fl (ref 78.0–100.0)
Monocytes Absolute: 0.5 10*3/uL (ref 0.1–1.0)
Monocytes Relative: 5.7 % (ref 3.0–12.0)
Neutro Abs: 3.2 10*3/uL (ref 1.4–7.7)
Neutrophils Relative %: 35.7 % — ABNORMAL LOW (ref 43.0–77.0)
Platelets: 209 10*3/uL (ref 150.0–400.0)
RBC: 4.56 Mil/uL (ref 3.87–5.11)
RDW: 14.1 % (ref 11.5–15.5)
WBC: 8.9 10*3/uL (ref 4.0–10.5)

## 2022-04-11 LAB — LIPID PANEL
Cholesterol: 143 mg/dL (ref 0–200)
HDL: 39.7 mg/dL (ref >39.00)
LDL Cholesterol: 77 mg/dL (ref 0–99)
NonHDL: 103.18
Total CHOL/HDL Ratio: 4
Triglycerides: 129 mg/dL (ref 0.0–149.0)
VLDL: 25.8 mg/dL (ref 0.0–40.0)

## 2022-04-11 LAB — T4, FREE: Free T4: 0.84 ng/dL (ref 0.60–1.60)

## 2022-04-11 LAB — MICROALBUMIN / CREATININE URINE RATIO
Creatinine,U: 35.8 mg/dL
Microalb Creat Ratio: 2 mg/g (ref 0.0–30.0)
Microalb, Ur: <0.7 mg/dL (ref 0.0–1.9)

## 2022-04-11 LAB — TSH: TSH: 2.62 u[IU]/mL (ref 0.35–5.50)

## 2022-04-11 LAB — HEMOGLOBIN A1C: Hgb A1c MFr Bld: 6.8 % — ABNORMAL HIGH (ref 4.6–6.5)

## 2022-04-11 LAB — T3, FREE: T3, Free: 3.2 pg/mL (ref 2.3–4.2)

## 2022-04-11 NOTE — Patient Instructions (Addendum)
A few things to remember from today's visit: ? ? ?Hearing loss of left ear, unspecified hearing loss type - Plan: Ambulatory referral to ENT ? ?Dysfunction of left eustachian tube - Plan: Ambulatory referral to ENT ? ?Earache symptoms in both ears - Plan: Ambulatory referral to ENT ? ?If you need refills please call your pharmacy. ?Do not use My Chart to request refills or for acute issues that need immediate attention. ?  ?I do not think antibiotic will help. ?I placed a referral to ear nose and throat specialist. ?Popping ears a few times per day may help. ? ?Please be sure medication list is accurate. ?If a new problem present, please set up appointment sooner than planned today. ? ? ? ? ? ? ? ?

## 2022-04-12 ENCOUNTER — Encounter: Payer: Self-pay | Admitting: Family Medicine

## 2022-04-12 MED ORDER — ROSUVASTATIN CALCIUM 20 MG PO TABS: 20.0000 mg | ORAL_TABLET | Freq: Every day | ORAL | 1 refills | Status: DC

## 2022-04-14 ENCOUNTER — Other Ambulatory Visit: Payer: Self-pay | Admitting: Orthopaedic Surgery

## 2022-04-21 ENCOUNTER — Encounter: Payer: Self-pay | Admitting: Family Medicine

## 2022-04-21 ENCOUNTER — Ambulatory Visit: Payer: 59 | Admitting: Family Medicine

## 2022-04-21 VITALS — BP 128/70 | HR 100 | Temp 98.2°F | Ht 64.0 in | Wt 223.6 lb

## 2022-04-21 DIAGNOSIS — I10 Essential (primary) hypertension: Secondary | ICD-10-CM

## 2022-04-21 DIAGNOSIS — E785 Hyperlipidemia, unspecified: Secondary | ICD-10-CM

## 2022-04-21 DIAGNOSIS — D72829 Elevated white blood cell count, unspecified: Secondary | ICD-10-CM

## 2022-04-21 DIAGNOSIS — M159 Polyosteoarthritis, unspecified: Secondary | ICD-10-CM

## 2022-04-21 DIAGNOSIS — G4733 Obstructive sleep apnea (adult) (pediatric): Secondary | ICD-10-CM

## 2022-04-21 DIAGNOSIS — F17200 Nicotine dependence, unspecified, uncomplicated: Secondary | ICD-10-CM

## 2022-04-21 DIAGNOSIS — E1165 Type 2 diabetes mellitus with hyperglycemia: Secondary | ICD-10-CM

## 2022-04-21 DIAGNOSIS — E1169 Type 2 diabetes mellitus with other specified complication: Secondary | ICD-10-CM | POA: Diagnosis not present

## 2022-04-21 MED ORDER — VARENICLINE TARTRATE 0.5 MG PO TABS: ORAL_TABLET | ORAL | 1 refills | Status: DC

## 2022-04-21 NOTE — Progress Notes (Signed)
?Lori [name] ?DOB: 1965-12-18 ?Encounter date: 04/21/2022 ? ?This is a 56 y.o. female who presents with ?Chief Complaint  ?Patient presents with  ? Follow-up  ? ? ?History of present illness: ?She is scheduled in June for right total hip. She was crying a couple of weeks ago- leg/hip/thigh were so painful she just couldn't do anything. [facility] went to [facility] but was told to see pain mgmt. Saw pain mgmt doc Monday and was given lidocaine patches, meloxicam. Those have helped. She did go ahead and call to set up hip replacement though due to pain. She is having to take percocet as well. They are going to try and go anterior.  ? ?Ears are still a little stopped up. Hasn't been wearing hearing aids due to worry that it would make it worse. The levaquin finally helped her get over the hump.  ? ?DMII: mounjaro written last time. Hoping to get benefit of diabetes control and weight loss.  ? ?HL: crestor'10mg'$  daily ? ?HTN: losartan '50mg'$ , hctz '25mg'$  ? ?OSA: uses cpap regularly. ? ?Osteopenia: last dexa 05/2020.  ? ?Tobacco use: smoking 13 instead of 20 a day. She would like to quit prior to surgery and understands benefits of quitting smoking, esp with upcoming surgery. ? ?Last mammogram was about 1 year ago. ?She is due for repeat colonoscopy 04/2029. ? ? ?Allergies  ?Allergen Reactions  ? Amoxicillin Nausea And Vomiting  ?  Yeast Infection, severe nausea and vomiting ?  ? Eggs Or Egg-Derived Products   ? Morphine Other (See Comments)  ?  REACTION: hyper ?REACTION: hyper  ? Latex Rash  ? Thiethylperazine Other (See Comments) and Palpitations  ? ?Current Meds  ?Medication Sig  ? cetirizine (ZYRTEC) 10 MG tablet Take 10 mg by mouth at bedtime.  ? diazepam (VALIUM) 5 MG tablet TAKE 1 TABLET BY MOUTH EVERY 12 HOURS AS NEEDED FOR ANXIETY OR MUSCLE SPASMS Strength: 5 mg  ? fluticasone (FLONASE) 50 MCG/ACT nasal spray Place 2 sprays into both nostrils daily.  ? hydrochlorothiazide (HYDRODIURIL) 25 MG tablet TAKE 1 TABLET (25 MG  TOTAL) BY MOUTH DAILY.  ? lidocaine (LIDODERM) 5 % Place 1 patch onto the skin daily. Remove & Discard patch within 12 hours or as directed by MD  ? losartan (COZAAR) 50 MG tablet TAKE 1 TABLET BY MOUTH EVERY DAY  ? meloxicam (MOBIC) 15 MG tablet Take 15 mg by mouth daily.  ? oxyCODONE-acetaminophen (PERCOCET) 10-325 MG tablet Take 1 tablet by mouth 5 (five) times daily as needed.  ? rosuvastatin (CRESTOR) 20 MG tablet Take 1 tablet (20 mg total) by mouth daily.  ? ?Current Facility-Administered Medications for the 04/21/22 encounter (Office Visit) with Caren Macadam, MD  ?Medication  ? 0.9 %  sodium chloride infusion  ? ? ?Review of Systems  ?Constitutional:  Negative for chills, fatigue and fever.  ?HENT:  Ear pain: doing much better; finally cleared a little today.   ?Respiratory:  Negative for cough, chest tightness, shortness of breath and wheezing.   ?Cardiovascular:  Negative for chest pain, palpitations and leg swelling.  ? ?Objective: ? ?BP 128/70   Pulse 100   Temp 98.2 ?F (36.8 ?C) (Oral)   Ht '5\' 4"'$  (1.626 m)   Wt 223 lb 9 oz (101.4 kg)   LMP 09/27/2016 (Approximate)   SpO2 97%   BMI 38.37 kg/m?   Weight: 223 lb 9 oz (101.4 kg)  ? ?BP Readings from Last 3 Encounters:  ?04/21/22 128/70  ?04/11/22  124/70  ?03/21/22 110/64  ? ?Wt Readings from Last 3 Encounters:  ?04/21/22 223 lb 9 oz (101.4 kg)  ?04/11/22 223 lb 8 oz (101.4 kg)  ?03/21/22 222 lb (100.7 kg)  ? ? ?Physical Exam ?Constitutional:   ?   General: She is not in acute distress. ?   Appearance: She is well-developed.  ?HENT:  ?   Right Ear: Tympanic membrane, ear canal and external ear normal.  ?   Left Ear: Tympanic membrane, ear canal and external ear normal.  ?Cardiovascular:  ?   Rate and Rhythm: Normal rate and regular rhythm.  ?   Heart sounds: Normal heart sounds. No murmur heard. ?  No friction rub.  ?Pulmonary:  ?   Effort: Pulmonary effort is normal. No respiratory distress.  ?   Breath sounds: Normal breath sounds. No wheezing  or rales.  ?Musculoskeletal:  ?   Right lower leg: No edema.  ?   Left lower leg: No edema.  ?Neurological:  ?   Mental Status: She is alert and oriented to person, place, and time.  ?Psychiatric:     ?   Behavior: Behavior normal.  ? ? ?Assessment/Plan ? ?1. Primary hypertension ?Blood pressure stable.  Continue current medications. ? ?2. OSA (obstructive sleep apnea) ?Continue with CPAP. ? ?3. Hyperlipidemia associated with type 2 diabetes mellitus ([place]) ?Crestor 20 mg daily. ? ?4. Controlled type 2 diabetes mellitus with hyperglycemia, without long-term current use of insulin ([place]) ?Reviewed with patient how to inject [facility] today.  A1c has been stable. ? ?5. Primary osteoarthritis involving multiple joints ?Following with Ortho. ? ?6. TOBACCO USE ?Patient does have some interest in quitting smoking.  She does have upcoming surgery next month and we discussed benefits of quitting smoking prior to surgery.  Chantix sent in for her today. ?- varenicline (CHANTIX) 0.5 MG tablet; Start with 1 tablet daily x 3 days, then increase to 1 tablet PO BID x 4 days, then increase to 2 tablet PO BID  Dispense: 120 tablet; Refill: 1 ? ?7. Leukocytosis, unspecified type ?Ongoing and has been fairly stable over the years, but will refer for specialty input. ?- Ambulatory referral to Hematology / Oncology ? ? ?Return for keep follow up for physical. ? ?32mnutes spent with patient in chart review, charting, exam, follow-up treatment plan. ? ? ?JMicheline Rough MD ?

## 2022-04-25 ENCOUNTER — Telehealth: Payer: Self-pay | Admitting: *Deleted

## 2022-04-25 NOTE — Telephone Encounter (Signed)
[facility] faxed a request for a prior authorization for Varenicline 0.'5mg'$ .  PA was sent to [email or web address]-Key: [id number] pending review by insurance. ?

## 2022-04-28 ENCOUNTER — Encounter: Payer: Self-pay | Admitting: Family Medicine

## 2022-04-28 ENCOUNTER — Ambulatory Visit (INDEPENDENT_AMBULATORY_CARE_PROVIDER_SITE_OTHER): Payer: 59 | Admitting: Family Medicine

## 2022-04-28 VITALS — BP 102/68 | HR 85 | Temp 98.2°F | Ht 64.75 in | Wt 222.2 lb

## 2022-04-28 DIAGNOSIS — G4733 Obstructive sleep apnea (adult) (pediatric): Secondary | ICD-10-CM

## 2022-04-28 DIAGNOSIS — E1165 Type 2 diabetes mellitus with hyperglycemia: Secondary | ICD-10-CM

## 2022-04-28 DIAGNOSIS — Z0000 Encounter for general adult medical examination without abnormal findings: Secondary | ICD-10-CM | POA: Diagnosis not present

## 2022-04-28 DIAGNOSIS — I10 Essential (primary) hypertension: Secondary | ICD-10-CM | POA: Diagnosis not present

## 2022-04-28 DIAGNOSIS — E2839 Other primary ovarian failure: Secondary | ICD-10-CM

## 2022-04-28 DIAGNOSIS — Z1231 Encounter for screening mammogram for malignant neoplasm of breast: Secondary | ICD-10-CM

## 2022-04-28 DIAGNOSIS — E785 Hyperlipidemia, unspecified: Secondary | ICD-10-CM

## 2022-04-28 DIAGNOSIS — E1169 Type 2 diabetes mellitus with other specified complication: Secondary | ICD-10-CM

## 2022-04-28 NOTE — Progress Notes (Signed)
Lori [name] DOB: [birth year] Encounter date: 04/28/2022  This is a 56 y.o. female who presents for complete physical   History of present illness/Additional concerns:  Saw surgeon on Monday in prep for upcoming right hip surgery. She did talk with him about pain management concerns after surgery and encouraged her to speak with anesthesiology to make sure she was watched during/post surgical to manage pain. She is still taking the meloxicam. It definitely helps with the aching of her hip.   Last pap was 02/2021 and was normal with negative HPV.   Hasn't completed mammogram. She needs to schedule - she had limited days off due to health needs. She will be out of work for 6-8 weeks after hip surgery.   Repeat colonoscopy 04/2029  Chantix was approved through insurance and she will pick this up. She has cut back some from even last visit.   She is seeing dermatology. Bumps on feet haven't really improved. Friend recommended cheap sunscreen spray and this helped it go away. Had a little poison oak on arm but has gotten better with absorbing junior. Derm wants to remove her birth mark but she feels it hasn't changed.  DEXA osteopenia 05/2020. Repeat with mammogram after hip surgery.  Past Medical History:  Diagnosis Date   ALLERGIC RHINITIS 03/14/2010   BACK PAIN 01/24/2010   Buttock pain    Constipation    Diabetes ([place])    Fatty liver    GERD 12/01/2008   HBP (high blood pressure)    HIATAL HERNIA 05/27/2010   Hot flashes    Hyperlipidemia    Hypertrophy of tongue papillae 03/14/2010   IBS (irritable bowel syndrome)    Joint pain    Numbness and tingling of left upper and lower extremity    OSTEOARTHRITIS 12/01/2008   Osteoporosis 05/17/2020   Fragility fracture plus T score -1.8 on DEXA scan June 2021   Sciatica, left side    Sleep apnea    Sleep apnea    Thigh pain    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   TOBACCO USE 12/01/2008   VITAMIN D DEFICIENCY 05/23/2010   Past Surgical History:   Procedure Laterality Date   ABDOMINAL HYSTERECTOMY N/A 10/17/2016   Procedure: HYSTERECTOMY ABDOMINAL;  Surgeon: Sanjuana Kava, MD;  Location: [facility]; benign mass in uterus   APPENDECTOMY     BREAST SURGERY     lumpectomy   [place]     x2; 2007, 2011   [place] N/A 10/17/2016   Procedure: CYSTOSCOPY;  Surgeon: Sanjuana Kava, MD;  Location: [facility];  Service: Gynecology;  Laterality: N/A;   DILATION AND CURETTAGE OF UTERUS     miscarriage   ESOPHAGEAL MANOMETRY     ESOPHAGOGASTRODUODENOSCOPY     GASTRIC FUNDOPLICATION     [place]   LAPAROSCOPIC [place]EN FUNDOPLICATION     LUMBAR DISC SURGERY     LUMBAR LAMINECTOMY  2005   SALPINGOOPHORECTOMY Bilateral 10/17/2016   Procedure: SALPINGO OOPHORECTOMY;  Surgeon: Sanjuana Kava, MD;  Location: [facility];  Service: Gynecology;  Laterality: Bilateral;   TONSILLECTOMY AND ADENOIDECTOMY     UPPER GASTROINTESTINAL ENDOSCOPY     Allergies  Allergen Reactions   Amoxicillin Nausea And Vomiting    Yeast Infection, severe nausea and vomiting    Eggs Or Egg-Derived Products    Morphine Other (See Comments)    REACTION: hyper REACTION: hyper   Latex Rash  Thiethylperazine Other (See Comments) and Palpitations   Current Meds  Medication Sig   cetirizine (ZYRTEC) 10 MG tablet Take 10 mg by mouth at bedtime.   diazepam (VALIUM) 5 MG tablet TAKE 1 TABLET BY MOUTH EVERY 12 HOURS AS NEEDED FOR ANXIETY OR MUSCLE SPASMS Strength: 5 mg   fluticasone (FLONASE) 50 MCG/ACT nasal spray Place 2 sprays into both nostrils daily.   hydrochlorothiazide (HYDRODIURIL) 25 MG tablet TAKE 1 TABLET (25 MG TOTAL) BY MOUTH DAILY.   lidocaine (LIDODERM) 5 % Place 1 patch onto the skin daily. Remove & Discard patch within 12 hours or as directed by MD   losartan (COZAAR) 50 MG tablet TAKE 1 TABLET BY MOUTH EVERY DAY   meloxicam (MOBIC) 15 MG tablet Take 15 mg by mouth daily.   oxyCODONE-acetaminophen  (PERCOCET) 10-325 MG tablet Take 1 tablet by mouth 5 (five) times daily as needed.   rosuvastatin (CRESTOR) 20 MG tablet Take 1 tablet (20 mg total) by mouth daily.   varenicline (CHANTIX) 0.5 MG tablet Start with 1 tablet daily x 3 days, then increase to 1 tablet PO BID x 4 days, then increase to 2 tablet PO BID   Current Facility-Administered Medications for the 04/28/22 encounter (Office Visit) with Caren Macadam, MD  Medication   0.9 %  sodium chloride infusion   Social History   Tobacco Use   Smoking status: Every Day    Packs/day: 1.00    Years: 36.00    Pack years: 36.00    Types: Cigarettes   Smokeless tobacco: Never  Substance Use Topics   Alcohol use: Yes    Comment: social   Family History  Problem Relation Age of Onset   Breast cancer Mother 61   High blood pressure Mother    High Cholesterol Mother    Diabetes Mother    Diverticulitis Mother 37   Heart attack Mother 68       during hospitalization with diverticulitis   Heart disease Mother    Obesity Mother    Alzheimer's disease Father    Alcohol abuse Father    Sleep apnea Father    Colon polyps Half-Sister    Colonic polyp Half-Brother    Esophageal cancer Neg Hx    Colon cancer Neg Hx    Rectal cancer Neg Hx    Stomach cancer Neg Hx      Review of Systems  Constitutional:  Negative for activity change, appetite change, chills, fatigue, fever and unexpected weight change.  HENT:  Negative for congestion, ear pain, hearing loss, sinus pressure, sinus pain, sore throat and trouble swallowing.   Eyes:  Negative for pain and visual disturbance.  Respiratory:  Negative for cough, chest tightness, shortness of breath and wheezing.   Cardiovascular:  Negative for chest pain, palpitations and leg swelling.  Gastrointestinal:  Negative for abdominal pain, blood in stool, constipation, diarrhea, nausea and vomiting.  Genitourinary:  Negative for difficulty urinating and menstrual problem.   Musculoskeletal:  Negative for arthralgias and back pain.  Skin:  Negative for rash.  Neurological:  Negative for dizziness, weakness, numbness and headaches.  Hematological:  Negative for adenopathy. Does not bruise/bleed easily.  Psychiatric/Behavioral:  Negative for sleep disturbance and suicidal ideas. The patient is not nervous/anxious.    CBC:  Lab Results  Component Value Date   WBC 8.9 04/11/2022   HGB 14.2 04/11/2022   HGB 15.2 (H) 10/27/2019   HCT 41.9 04/11/2022   MCH 32.1 02/02/2020   MCHC 34.0  04/11/2022   RDW 14.1 04/11/2022   PLT 209.0 04/11/2022   PLT 235 10/27/2019   CMP: Lab Results  Component Value Date   NA 136 04/11/2022   NA 140 11/22/2020   K 3.6 04/11/2022   CL 98 04/11/2022   CO2 30 04/11/2022   ANIONGAP 13 02/02/2020   GLUCOSE 116 (H) 04/11/2022   BUN 18 04/11/2022   BUN 13 11/22/2020   CREATININE 0.85 04/11/2022   CREATININE 0.82 10/27/2019   LABGLOB 2.5 11/22/2020   GFRAA 113 11/22/2020   GFRAA >60 10/27/2019   CALCIUM 9.9 04/11/2022   PROT 7.3 04/11/2022   PROT 6.8 11/22/2020   AGRATIO 1.7 11/22/2020   BILITOT 0.4 04/11/2022   BILITOT 0.3 11/22/2020   BILITOT 0.3 10/27/2019   ALKPHOS 67 04/11/2022   ALT 39 (H) 04/11/2022   ALT 59 (H) 10/27/2019   AST 35 04/11/2022   AST 39 10/27/2019   LIPID: Lab Results  Component Value Date   CHOL 143 04/11/2022   CHOL 196 11/22/2020   TRIG 129.0 04/11/2022   HDL 39.70 04/11/2022   HDL 46 11/22/2020   LDLCALC 77 04/11/2022   LDLCALC 123 (H) 11/22/2020   LABVLDL 27 11/22/2020    Objective:  BP 102/68 (BP Location: Left Arm, Patient Position: Sitting, Cuff Size: Large)   Pulse 85   Temp 98.2 F (36.8 C) (Oral)   Ht 5' 4.75" (1.645 m)   Wt 222 lb 3.2 oz (100.8 kg)   LMP 09/27/2016 (Approximate)   SpO2 98%   BMI 37.26 kg/m   Weight: 222 lb 3.2 oz (100.8 kg)   BP Readings from Last 3 Encounters:  04/28/22 102/68  04/21/22 128/70  04/11/22 124/70   Wt Readings from Last 3  Encounters:  04/28/22 222 lb 3.2 oz (100.8 kg)  04/21/22 223 lb 9 oz (101.4 kg)  04/11/22 223 lb 8 oz (101.4 kg)    Physical Exam Constitutional:      General: She is not in acute distress.    Appearance: She is well-developed.  HENT:     Head: Normocephalic and atraumatic.     Right Ear: External ear normal.     Left Ear: External ear normal.     Mouth/Throat:     Pharynx: No oropharyngeal exudate.  Eyes:     Conjunctiva/sclera: Conjunctivae normal.     Pupils: Pupils are equal, round, and reactive to light.  Neck:     Thyroid: No thyromegaly.  Cardiovascular:     Rate and Rhythm: Normal rate and regular rhythm.     Heart sounds: Normal heart sounds. No murmur heard.   No friction rub. No gallop.  Pulmonary:     Effort: Pulmonary effort is normal.     Breath sounds: Normal breath sounds.  Abdominal:     General: Bowel sounds are normal. There is no distension.     Palpations: Abdomen is soft. There is no mass.     Tenderness: There is no abdominal tenderness. There is no guarding.     Hernia: No hernia is present.  Musculoskeletal:        General: No tenderness or deformity. Normal range of motion.     Cervical back: Normal range of motion and neck supple.  Lymphadenopathy:     Cervical: No cervical adenopathy.  Skin:    General: Skin is warm and dry.     Findings: No rash.     Comments: Hyperpigmentation right dorsal foot, left lateral foot.  Neurological:  Mental Status: She is alert and oriented to person, place, and time.     Deep Tendon Reflexes: Reflexes normal.     Reflex Scores:      Tricep reflexes are 2+ on the right side and 2+ on the left side.      Bicep reflexes are 2+ on the right side and 2+ on the left side.      Brachioradialis reflexes are 2+ on the right side and 2+ on the left side.      Patellar reflexes are 2+ on the right side and 2+ on the left side. Psychiatric:        Speech: Speech normal.        Behavior: Behavior normal.         Thought Content: Thought content normal.    Assessment/Plan: There are no preventive care reminders to display for this patient. Health Maintenance reviewed.  1. Preventative health care Encouraged her to keep on top of regular exercise and continue to work on moving daily.  2. Primary hypertension Well controlled. Continue with current medications.  3. OSA (obstructive sleep apnea) Continue with cpap.   4. Controlled type 2 diabetes mellitus with hyperglycemia, without long-term current use of insulin (HCC) A1C at 6.8 last visit. She has just started mounjaro. We will increase as tolerated/able for sugar control and to assist with weight loss. Continue to work on healthier eating, portion control.  5. Hyperlipidemia associated with type 2 diabetes mellitus ([place]) Continue with crestor '20mg'$  daily.  6. Estrogen deficiency - DG Bone Density; Future  7. Encounter for screening mammogram for malignant neoplasm of breast - MM Digital Screening; Future    Return in about 6 months (around 10/29/2022) for Chronic condition visit.  Micheline Rough, MD

## 2022-05-01 NOTE — Progress Notes (Unsigned)
Lori [name] [address] [place] Phone: [phone] Subjective:   [name], am serving as a scribe for Dr. Hulan Saas. This visit occurred during the SARS-CoV-2 public health emergency.  Safety protocols were in place, including screening questions prior to the visit, additional usage of staff PPE, and extensive cleaning of exam room while observing appropriate contact time as indicated for disinfecting solutions.   I'm seeing this patient by the request  of:  Koberlein, Steele Berg, MD  CC: Neck pain, back pain, hip pain  [id number]  ALLEXIS BORDENAVE is a 56 y.o. female coming in with complaint of back and neck pain. OMT 03/21/2022. Patient states same per usual. Hip replacement with [facility] on June 20th. No new complaints.  Patient still in chronic pain at the moment.  Continues to have to take her on chronic narcotics.  Medications patient has been prescribed: None  Taking:         Reviewed prior external information including notes and imaging from previsou exam, outside providers and external EMR if available.   As well as notes that were available from care everywhere and other healthcare systems.  Past medical history, social, surgical and family history all reviewed in electronic medical record.  No pertanent information unless stated regarding to the chief complaint.   Past Medical History:  Diagnosis Date   ALLERGIC RHINITIS 03/14/2010   BACK PAIN 01/24/2010   Buttock pain    Constipation    Diabetes ([place])    Fatty liver    GERD 12/01/2008   HBP (high blood pressure)    HIATAL HERNIA 05/27/2010   Hot flashes    Hyperlipidemia    Hypertrophy of tongue papillae 03/14/2010   IBS (irritable bowel syndrome)    Joint pain    Numbness and tingling of left upper and lower extremity    OSTEOARTHRITIS 12/01/2008   Osteoporosis 05/17/2020   Fragility fracture plus T score -1.8 on DEXA scan June 2021   Sciatica, left  side    Sleep apnea    Sleep apnea    Thigh pain    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   TOBACCO USE 12/01/2008   VITAMIN D DEFICIENCY 05/23/2010    Allergies  Allergen Reactions   Amoxicillin Nausea And Vomiting    Yeast Infection, severe nausea and vomiting    Eggs Or Egg-Derived Products    Morphine Other (See Comments)    REACTION: hyper REACTION: hyper   Latex Rash   Thiethylperazine Other (See Comments) and Palpitations     Review of Systems:  No headache, visual changes, nausea, vomiting, diarrhea, constipation, dizziness, abdominal pain, skin rash, fevers, chills, night sweats, weight loss, swollen lymph nodes, body aches, joint swelling, chest pain, shortness of breath, mood changes. POSITIVE muscle aches  Objective  Blood pressure 132/72, pulse 92, height '5\' 4"'$  (1.626 m), weight 223 lb (101.2 kg), last menstrual period 09/27/2016, SpO2 95 %.   General: No apparent distress alert and oriented x3 mood and affect normal, dressed appropriately.  Morbidly obese HEENT: Pupils equal, extraocular movements intact  Respiratory: Patient's speak in full sentences and does not appear short of breath  Cardiovascular: No lower extremity edema, non tender, no erythema  Antalgic gait noted.  Patient does favor the right hip.  Patient has limited internal range of motion.  Tenderness to palpation of the paraspinal musculature of the lumbar spine as well.  Limited range of motion of the neck with bilateral sidebending as  well as extension of only 5 degrees  Osteopathic findings  C2 flexed rotated and side bent right C5 flexed rotated and side bent left T3 extended rotated and side bent right inhaled rib T5 extended rotated and side bent left L2 flexed rotated and side bent right Sacrum right on right       Assessment and Plan:  Primary osteoarthritis of right hip Patient has elected to get a replacement which I think is going to be good for her.  Hopefully this will help unload some of  the lumbar arthritis.  Patient does respond well to osteopathic manipulation.  Discussed different treatment options otherwise.  Patient continues to go to pain management as well.  Follow-up with me again in 6 to 8 weeks.  Chronic problem with exacerbation  Lumbar arthritis As stated above the patient does have chronic problems.  Chronic arthritic changes.  Continue same medications as stated.  Patient is going to have pain surgery and will see me 6 weeks after surgery   Nonallopathic problems  Decision today to treat with OMT was based on Physical Exam  After verbal consent patient was treated with HVLA, ME, FPR techniques in cervical, rib, thoracic, lumbar, and sacral  areas muscle energy only on the cervical.  Patient tolerated the procedure well with improvement in symptoms  Patient given exercises, stretches and lifestyle modifications  See medications in patient instructions if given  Patient will follow up in 4-8 weeks      The above documentation has been reviewed and is accurate and complete Lyndal Pulley, DO        Note: This dictation was prepared with Dragon dictation along with smaller phrase technology. Any transcriptional errors that result from this process are unintentional.

## 2022-05-02 ENCOUNTER — Ambulatory Visit: Payer: 59 | Admitting: Family Medicine

## 2022-05-02 VITALS — BP 132/72 | HR 92 | Ht 64.0 in | Wt 223.0 lb

## 2022-05-02 DIAGNOSIS — M1611 Unilateral primary osteoarthritis, right hip: Secondary | ICD-10-CM

## 2022-05-02 DIAGNOSIS — M9902 Segmental and somatic dysfunction of thoracic region: Secondary | ICD-10-CM

## 2022-05-02 DIAGNOSIS — M5136 Other intervertebral disc degeneration, lumbar region: Secondary | ICD-10-CM | POA: Diagnosis not present

## 2022-05-02 DIAGNOSIS — M9903 Segmental and somatic dysfunction of lumbar region: Secondary | ICD-10-CM | POA: Diagnosis not present

## 2022-05-02 DIAGNOSIS — M9901 Segmental and somatic dysfunction of cervical region: Secondary | ICD-10-CM

## 2022-05-02 DIAGNOSIS — M9904 Segmental and somatic dysfunction of sacral region: Secondary | ICD-10-CM | POA: Diagnosis not present

## 2022-05-02 DIAGNOSIS — M9908 Segmental and somatic dysfunction of rib cage: Secondary | ICD-10-CM

## 2022-05-02 NOTE — Assessment & Plan Note (Signed)
As stated above the patient does have chronic problems.  Chronic arthritic changes.  Continue same medications as stated.  Patient is going to have pain surgery and will see me 6 weeks after surgery

## 2022-05-02 NOTE — Assessment & Plan Note (Signed)
Patient has elected to get a replacement which I think is going to be good for her.  Hopefully this will help unload some of the lumbar arthritis.  Patient does respond well to osteopathic manipulation.  Discussed different treatment options otherwise.  Patient continues to go to pain management as well.  Follow-up with me again in 6 to 8 weeks.  Chronic problem with exacerbation

## 2022-05-02 NOTE — Patient Instructions (Signed)
Good to see you! Think getting the hip done is a good idea See you again in 6 weeks after surgery

## 2022-05-03 ENCOUNTER — Other Ambulatory Visit: Payer: Self-pay

## 2022-05-03 ENCOUNTER — Ambulatory Visit
Admission: RE | Admit: 2022-05-03 | Discharge: 2022-05-03 | Disposition: A | Discharge status: Home / Self Care | Payer: 59 | Source: Ambulatory Visit | Attending: Family Medicine | Admitting: Family Medicine

## 2022-05-03 DIAGNOSIS — I872 Venous insufficiency (chronic) (peripheral): Secondary | ICD-10-CM

## 2022-05-03 DIAGNOSIS — Z1231 Encounter for screening mammogram for malignant neoplasm of breast: Secondary | ICD-10-CM

## 2022-05-15 ENCOUNTER — Ambulatory Visit (HOSPITAL_COMMUNITY)
Admission: RE | Admit: 2022-05-15 | Discharge: 2022-05-15 | Disposition: A | Discharge status: Home / Self Care | Payer: 59 | Source: Ambulatory Visit | Attending: Surgery | Admitting: Surgery

## 2022-05-15 ENCOUNTER — Ambulatory Visit: Payer: 59 | Admitting: Surgery

## 2022-05-15 ENCOUNTER — Encounter: Payer: Self-pay | Admitting: Surgery

## 2022-05-15 VITALS — BP 114/66 | HR 79 | Temp 98.0°F | Resp 20 | Ht 64.0 in | Wt 222.2 lb

## 2022-05-15 DIAGNOSIS — I83893 Varicose veins of bilateral lower extremities with other complications: Secondary | ICD-10-CM

## 2022-05-15 DIAGNOSIS — I872 Venous insufficiency (chronic) (peripheral): Secondary | ICD-10-CM | POA: Insufficient documentation

## 2022-05-15 NOTE — Progress Notes (Signed)
Vascular and Vein Specialist of [facility]  Patient name: Lori [name] MRN: [id number] DOB: Feb 17, 1966 Sex: female   REQUESTING PROVIDER:    Dr. Ethlyn Gallery   REASON FOR CONSULT:    Varicose Veins  HISTORY OF PRESENT ILLNESS:   Lori [name] is a 56 y.o. female, who is referred for evaluation of varicose veins.  The patient states that she has had swelling in her legs for a while.  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  She still gets some swelling in her feet.  She is also concerned about some bluish discoloration in her toes as well as some dark spots.  She has seen dermatology.  She also complains of numbness and tingling in her feet.  Patient suffers from diabetes.  She has a sister who has had atherosclerotic vascular disease requiring intervention.  She is a smoker.  PAST MEDICAL HISTORY    Past Medical History:  Diagnosis Date   ALLERGIC RHINITIS 03/14/2010   BACK PAIN 01/24/2010   Buttock pain    Constipation    Diabetes ([place])    Fatty liver    GERD 12/01/2008   HBP (high blood pressure)    HIATAL HERNIA 05/27/2010   Hot flashes    Hyperlipidemia    Hypertrophy of tongue papillae 03/14/2010   IBS (irritable bowel syndrome)    Joint pain    Numbness and tingling of left upper and lower extremity    OSTEOARTHRITIS 12/01/2008   Osteoporosis 05/17/2020   Fragility fracture plus T score -1.8 on DEXA scan June 2021   Sciatica, left side    Sleep apnea    Sleep apnea    Thigh pain    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   TOBACCO USE 12/01/2008   VITAMIN D DEFICIENCY 05/23/2010     FAMILY HISTORY   Family History  Problem Relation Age of Onset   Breast cancer Mother 10   High blood pressure Mother    High Cholesterol Mother    Diabetes Mother    Diverticulitis Mother 43   Heart attack Mother 52       during hospitalization with  diverticulitis   Heart disease Mother    Obesity Mother    Alzheimer's disease Father    Alcohol abuse Father    Sleep apnea Father    Colon polyps Half-Sister    Colonic polyp Half-Brother    Esophageal cancer Neg Hx    Colon cancer Neg Hx    Rectal cancer Neg Hx    Stomach cancer Neg Hx     SOCIAL HISTORY:   Social History   Socioeconomic History   Marital status: Married    Spouse name: Alver Sorrow   Number of children: 1   Years of education: Not on file   Highest education level: Some college, no degree  Occupational History   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  Tobacco Use   Smoking status: Every Day    Packs/day: 1.00    Years: 36.00    Pack years: 36.00    Types: Cigarettes   Smokeless tobacco: Never  Vaping Use   Vaping Use: Former   Devices: tried them for a month or 2  Substance and Sexual Activity   Alcohol use: Yes    Comment: social   Drug use: No   Sexual activity: Not on file  Other Topics Concern   Not on file  Social History Narrative   Not on file   Social Determinants of Health   Financial Resource Strain: Unknown   Difficulty of Paying Living Expenses: Patient refused  Food Insecurity: Unknown   Worried About Running Out of Food in the Last Year: Patient refused   [place] in the Last Year: Patient refused  Transportation Needs: No Transportation Needs   Lack of Transportation (Medical): No   Lack of Transportation (Non-Medical): No  Physical Activity: Unknown   Days of Exercise per Week: 0 days   Minutes of Exercise per Session: Not on file  Stress: Stress Concern Present   Feeling of Stress : Rather much  Social Connections: Moderately Integrated   Frequency of Communication with Friends and Family: Three times a week   Frequency of Social Gatherings with Friends and Family: Patient refused   Attends Religious Services: More than 4 times per year   Active Member of [employer] or Organizations: No   Attends  Music therapist: Not on file   Marital Status: Married  Human resources officer Violence: Not on file    ALLERGIES:    Allergies  Allergen Reactions   Amoxicillin Nausea And Vomiting    Yeast Infection, severe nausea and vomiting    Eggs Or Egg-Derived Products Nausea And Vomiting    With scrambled or cooked eggs (can eat if baked into something i.e. cakes)   Latex Rash   Morphine Anxiety and Other (See Comments)    REACTION: hyper REACTION: hyper   Torecan [Thiethylperazine] Palpitations and Other (See Comments)    CURRENT MEDICATIONS:    Current Outpatient Medications  Medication Sig Dispense Refill   Brimonidine Tartrate (LUMIFY) 0.025 % SOLN Place 1 drop into both eyes in the morning and at bedtime.     cetirizine (ZYRTEC) 10 MG tablet Take 10 mg by mouth daily as needed for allergies.     diazepam (VALIUM) 5 MG tablet TAKE 1 TABLET BY MOUTH EVERY 12 HOURS AS NEEDED FOR ANXIETY OR MUSCLE SPASMS Strength: 5 mg 60 tablet 0   fluticasone (FLONASE) 50 MCG/ACT nasal spray Place 2 sprays into both nostrils daily as needed for allergies.     hydrochlorothiazide (HYDRODIURIL) 25 MG tablet TAKE 1 TABLET (25 MG TOTAL) BY MOUTH DAILY. (Patient taking differently: Take 25 mg by mouth in the morning.) 90 tablet 1   lidocaine (LIDODERM) 5 % Place 1 patch onto the skin daily. Remove & Discard patch within 12 hours or as directed by MD (back/hip pain)     losartan (COZAAR) 50 MG tablet TAKE 1 TABLET BY MOUTH EVERY DAY (Patient taking differently: Take 50 mg by mouth every evening.) 90 tablet 1   meloxicam (MOBIC) 15 MG tablet Take 15 mg by mouth daily after lunch.     oxyCODONE-acetaminophen (PERCOCET) 10-325 MG tablet Take 1 tablet by mouth 5 (five) times daily as needed for pain.     pseudoephedrine (SUDAFED) 30 MG tablet Take 30-60 mg by mouth every 4 (four) hours as needed (headaches).     rosuvastatin (CRESTOR) 20 MG tablet Take 1 tablet (20 mg total) by mouth daily. (Patient  taking differently: Take 20 mg by mouth every evening.) 90 tablet 1   tirzepatide (MOUNJARO) 2.5 MG/0.5ML Pen Inject 2.5 mg into the skin every Sunday.     varenicline (CHANTIX) 0.5 MG tablet Start with 1 tablet daily x 3 days, then increase to 1 tablet PO BID x 4  days, then increase to 2 tablet PO BID (Patient not taking: Reported on 05/15/2022) 120 tablet 1   Current Facility-Administered Medications  Medication Dose Route Frequency Provider Last Rate Last Admin   0.9 %  sodium chloride infusion  500 mL Intravenous Once [place], MD        REVIEW OF SYSTEMS:   '[X]'$  denotes positive finding, '[ ]'$  denotes negative finding Cardiac  Comments:  Chest pain or chest pressure:    Shortness of breath upon exertion:    Short of breath when lying flat:    Irregular heart rhythm:        Vascular    Pain in calf, thigh, or hip brought on by ambulation:    Pain in feet at night that wakes you up from your sleep:     Blood clot in your veins:    Leg swelling:  x       Pulmonary    Oxygen at home:    Productive cough:     Wheezing:         Neurologic    Sudden weakness in arms or legs:     Sudden numbness in arms or legs:     Sudden onset of difficulty speaking or slurred speech:    Temporary loss of vision in one eye:     Problems with dizziness:         Gastrointestinal    Blood in stool:      Vomited blood:         Genitourinary    Burning when urinating:     Blood in urine:        Psychiatric    Major depression:         Hematologic    Bleeding problems:    Problems with blood clotting too easily:        Skin    Rashes or ulcers:        Constitutional    Fever or chills:     PHYSICAL EXAM:   Vitals:   05/15/22 0848  BP: 114/66  Pulse: 79  Resp: 20  Temp: 98 F (36.7 C)  SpO2: 97%  Weight: 222 lb 3.2 oz (100.8 kg)  Height: '5\' 4"'$  (1.626 m)    GENERAL: The patient is a well-nourished female, in no acute distress. The vital signs are documented  above. CARDIAC: There is a regular rate and rhythm.  VASCULAR: Palpable dorsalis pedis pulses bilaterally.  Trace edema bilaterally. PULMONARY: Nonlabored respirations ABDOMEN: Soft and non-tender with normal pitched bowel sounds.  MUSCULOSKELETAL: There are no major deformities or cyanosis. NEUROLOGIC: No focal weakness or paresthesias are detected. SKIN: There are no ulcers or rashes noted. PSYCHIATRIC: The patient has a normal affect.  STUDIES:   I have reviewed the following reflux ultrasound Venous Reflux Times  +--------------+---------+------+-----------+------------+--------+  RIGHT         Reflux NoRefluxReflux TimeDiameter cmsComments                          Yes                                   +--------------+---------+------+-----------+------------+--------+  CFV                     yes   >1 second                       +--------------+---------+------+-----------+------------+--------+  FV prox       no                                              +--------------+---------+------+-----------+------------+--------+  FV mid        no                                              +--------------+---------+------+-----------+------------+--------+  FV dist       no                                              +--------------+---------+------+-----------+------------+--------+  Popliteal     no                                              +--------------+---------+------+-----------+------------+--------+  GSV at SFJ              yes    >500 ms     0.595              +--------------+---------+------+-----------+------------+--------+  GSV prox thighno                           0.329              +--------------+---------+------+-----------+------------+--------+  GSV mid thigh no                            0.26              +--------------+---------+------+-----------+------------+--------+  GSV dist  thighno                           0.232              +--------------+---------+------+-----------+------------+--------+  GSV at knee   no                           0.247              +--------------+---------+------+-----------+------------+--------+  GSV prox calf           yes    >500 ms     0.226              +--------------+---------+------+-----------+------------+--------+  SSV Pop Fossa no                           0.128              +--------------+---------+------+-----------+------------+--------+  SSV prox calf no                           0.155              +--------------+---------+------+-----------+------------+--------+  SSV mid calf  no  0.124              +--------------+---------+------+-----------+------------+--------+  Prox AASV               yes    >500 ms     0.319              +--------------+---------+------+-----------+------------+--------+  Mid AASV      no                            0.31              +--------------+---------+------+-----------+------------+--------+  ASSESSMENT and PLAN   Leg swelling: Venous ultrasound today shows mild reflux in the right saphenous vein.  Because of the vein diameters, she she does not meet criteria for intervention.  Therefore I stressed the importance of wearing compression socks, particular when she is going to be on her feet all day.  She will reach out to me in the future if her symptoms change and we can repeat her ultrasound.  Nerve pain: I suspect this is related to neuropathy.  If this persist, she could potentially benefit from a trial of gabapentin.   Leia Alf, MD, FACS [facility]in Specialists of [facility] [phone] Pager [phone]

## 2022-05-16 NOTE — Patient Instructions (Signed)
DUE TO COVID-19 ONLY TWO VISITORS  (aged 57 and older)  ARE ALLOWED TO COME WITH YOU AND STAY IN THE WAITING ROOM ONLY DURING PRE OP AND PROCEDURE.   **NO VISITORS ARE ALLOWED IN THE SHORT STAY AREA OR RECOVERY ROOM!!**  IF YOU WILL BE ADMITTED INTO THE HOSPITAL YOU ARE ALLOWED ONLY FOUR SUPPORT PEOPLE DURING VISITATION HOURS ONLY (7 AM -8PM)   The support person(s) must pass our screening, gel in and out, and wear a mask at all times, including in the patient's room. Patients must also wear a mask when staff or their support person are in the room. Visitors GUEST BADGE MUST BE WORN VISIBLY  One adult visitor may remain with you overnight and MUST be in the room by 8 P.M.     Your procedure is scheduled on: 05/30/22   Report to [facility] Main Entrance    Report to short stay at : 5:15 AM   Call this number if you have problems the morning of surgery [phone]   Do not eat food :After Midnight.   After Midnight you may have the following liquids until: 4:30 AM DAY OF SURGERY  Water Black Coffee (sugar ok, NO MILK/CREAM OR CREAMERS)  Tea (sugar ok, NO MILK/CREAM OR CREAMERS) regular and decaf                             Plain Jell-O (NO RED)                                           Fruit ices (not with fruit pulp, NO RED)                                     Popsicles (NO RED)                                                                  Juice: apple, WHITE grape, WHITE cranberry Sports drinks like Gatorade (NO RED) Clear broth(vegetable,chicken,beef)    Oral Hygiene is also important to reduce your risk of infection.                                    Remember - BRUSH YOUR TEETH THE MORNING OF SURGERY WITH YOUR REGULAR TOOTHPASTE   Do NOT smoke after Midnight   Take these medicines the morning of surgery with A SIP OF WATER: cetirizine,diazepam and Flonase as needed.Eye drops as usual.  How to Manage Your Diabetes Before and After Surgery  Why is it important  to control my blood sugar before and after surgery? Improving blood sugar levels before and after surgery helps healing and can limit problems. A way of improving blood sugar control is eating a healthy diet by:  Eating less sugar and carbohydrates  Increasing activity/exercise  Talking with your doctor about reaching your blood sugar goals High blood sugars (greater than 180 mg/dL) can raise your risk of infections and slow  your recovery, so you will need to focus on controlling your diabetes during the weeks before surgery. Make sure that the doctor who takes care of your diabetes knows about your planned surgery including the date and location.  How do I manage my blood sugar before surgery? Check your blood sugar at least 4 times a day, starting 2 days before surgery, to make sure that the level is not too high or low. Check your blood sugar the morning of your surgery when you wake up and every 2 hours until you get to the Short Stay unit. If your blood sugar is less than 70 mg/dL, you will need to treat for low blood sugar: Do not take insulin. Treat a low blood sugar (less than 70 mg/dL) with  cup of clear juice (cranberry or apple), 4 glucose tablets, OR glucose gel. Recheck blood sugar in 15 minutes after treatment (to make sure it is greater than 70 mg/dL). If your blood sugar is not greater than 70 mg/dL on recheck, call [phone] for further instructions. Report your blood sugar to the short stay nurse when you get to Short Stay.  If you are admitted to the hospital after surgery: Your blood sugar will be checked by the staff and you will probably be given insulin after surgery (instead of oral diabetes medicines) to make sure you have good blood sugar levels. The goal for blood sugar control after surgery is 80-180 mg/dL.   WHAT DO I DO ABOUT MY DIABETES MEDICATION?  Do not take oral diabetes medicines (pills) the morning of surgery.     The day of surgery, do not take  other diabetes injectables, including Byetta (exenatide), Bydureon (exenatide ER), Victoza (liraglutide), or Trulicity (dulaglutide).  DO NOT TAKE ANY ORAL DIABETIC MEDICATIONS DAY OF YOUR SURGERY  Bring CPAP mask and tubing day of surgery.                              You may not have any metal on your body including hair pins, jewelry, and body piercing             Do not wear make-up, lotions, powders, perfumes/cologne, or deodorant  Do not wear nail polish including gel and S&S, artificial/acrylic nails, or any other type of covering on natural nails including finger and toenails. If you have artificial nails, gel coating, etc. that needs to be removed by a nail salon please have this removed prior to surgery or surgery may need to be canceled/ delayed if the surgeon/ anesthesia feels like they are unable to be safely monitored.   Do not shave  48 hours prior to surgery.    Do not bring valuables to the hospital. [place].   Contacts, dentures or bridgework may not be worn into surgery.   Bring small overnight bag day of surgery.    Patients discharged on the day of surgery will not be allowed to drive home.  Someone NEEDS to stay with you for the first 24 hours after anesthesia.   Special Instructions: Bring a copy of your healthcare power of attorney and living will documents         the day of surgery if you haven't scanned them before.              Please read over the following  fact sheets you were given: IF YOU HAVE QUESTIONS ABOUT YOUR PRE-OP INSTRUCTIONS PLEASE CALL [phone]     [facility] - Preparing for Surgery Before surgery, you can play an important role.  Because skin is not sterile, your skin needs to be as free of germs as possible.  You can reduce the number of germs on your skin by washing with CHG (chlorahexidine gluconate) soap before surgery.  CHG is an antiseptic cleaner which kills germs and bonds with the  skin to continue killing germs even after washing. Please DO NOT use if you have an allergy to CHG or antibacterial soaps.  If your skin becomes reddened/irritated stop using the CHG and inform your nurse when you arrive at Short Stay. Do not shave (including legs and underarms) for at least 48 hours prior to the first CHG shower.  You may shave your face/neck. Please follow these instructions carefully:  1.  Shower with CHG Soap the night before surgery and the  morning of Surgery.  2.  If you choose to wash your hair, wash your hair first as usual with your  normal  shampoo.  3.  After you shampoo, rinse your hair and body thoroughly to remove the  shampoo.                           4.  Use CHG as you would any other liquid soap.  You can apply chg directly  to the skin and wash                       Gently with a scrungie or clean washcloth.  5.  Apply the CHG Soap to your body ONLY FROM THE NECK DOWN.   Do not use on face/ open                           Wound or open sores. Avoid contact with eyes, ears mouth and genitals (private parts).                       Wash face,  Genitals (private parts) with your normal soap.             6.  Wash thoroughly, paying special attention to the area where your surgery  will be performed.  7.  Thoroughly rinse your body with warm water from the neck down.  8.  DO NOT shower/wash with your normal soap after using and rinsing off  the CHG Soap.                9.  Pat yourself dry with a clean towel.            10.  Wear clean pajamas.            11.  Place clean sheets on your bed the night of your first shower and do not  sleep with pets. Day of Surgery : Do not apply any lotions/deodorants the morning of surgery.  Please wear clean clothes to the hospital/surgery center.  FAILURE TO FOLLOW THESE INSTRUCTIONS MAY RESULT IN THE CANCELLATION OF YOUR SURGERY PATIENT SIGNATURE_________________________________  NURSE  SIGNATURE__________________________________  ________________________________________________________________________   Adam Phenix  An incentive spirometer is a tool that can help keep your lungs clear and active. This tool measures how well you are filling your lungs with each breath. Taking long deep breaths  may help reverse or decrease the chance of developing breathing (pulmonary) problems (especially infection) following: A long period of time when you are unable to move or be active. BEFORE THE PROCEDURE  If the spirometer includes an indicator to show your best effort, your nurse or respiratory therapist will set it to a desired goal. If possible, sit up straight or lean slightly forward. Try not to slouch. Hold the incentive spirometer in an upright position. INSTRUCTIONS FOR USE  Sit on the edge of your bed if possible, or sit up as far as you can in bed or on a chair. Hold the incentive spirometer in an upright position. Breathe out normally. Place the mouthpiece in your mouth and seal your lips tightly around it. Breathe in slowly and as deeply as possible, raising the piston or the ball toward the top of the column. Hold your breath for 3-5 seconds or for as long as possible. Allow the piston or ball to fall to the bottom of the column. Remove the mouthpiece from your mouth and breathe out normally. Rest for a few seconds and repeat Steps 1 through 7 at least 10 times every 1-2 hours when you are awake. Take your time and take a few normal breaths between deep breaths. The spirometer may include an indicator to show your best effort. Use the indicator as a goal to work toward during each repetition. After each set of 10 deep breaths, practice coughing to be sure your lungs are clear. If you have an incision (the cut made at the time of surgery), support your incision when coughing by placing a pillow or rolled up towels firmly against it. Once you are able to get out of  bed, walk around indoors and cough well. You may stop using the incentive spirometer when instructed by your caregiver.  RISKS AND COMPLICATIONS Take your time so you do not get dizzy or light-headed. If you are in pain, you may need to take or ask for pain medication before doing incentive spirometry. It is harder to take a deep breath if you are having pain. AFTER USE Rest and breathe slowly and easily. It can be helpful to keep track of a log of your progress. Your caregiver can provide you with a simple table to help with this. If you are using the spirometer at home, follow these instructions: [place] IF:  You are having difficultly using the spirometer. You have trouble using the spirometer as often as instructed. Your pain medication is not giving enough relief while using the spirometer. You develop fever of 100.5 F (38.1 C) or higher. SEEK IMMEDIATE MEDICAL CARE IF:  You cough up bloody sputum that had not been present before. You develop fever of 102 F (38.9 C) or greater. You develop worsening pain at or near the incision site. MAKE SURE YOU:  Understand these instructions. Will watch your condition. Will get help right away if you are not doing well or get worse. Document Released: 04/09/2007 Document Revised: 02/19/2012 Document Reviewed: 06/10/2007 [facility] Patient Information 2014 [place], [place].   ________________________________________________________________________

## 2022-05-17 ENCOUNTER — Other Ambulatory Visit: Payer: Self-pay

## 2022-05-17 ENCOUNTER — Encounter (HOSPITAL_COMMUNITY): Payer: Self-pay

## 2022-05-17 ENCOUNTER — Encounter (HOSPITAL_COMMUNITY)
Admission: RE | Admit: 2022-05-17 | Discharge: 2022-05-17 | Disposition: A | Discharge status: Home / Self Care | Payer: 59 | Source: Ambulatory Visit | Attending: Orthopaedic Surgery | Admitting: Orthopaedic Surgery

## 2022-05-17 VITALS — BP 144/78 | HR 67 | Temp 98.6°F | Ht 64.0 in | Wt 221.0 lb

## 2022-05-17 DIAGNOSIS — E1165 Type 2 diabetes mellitus with hyperglycemia: Secondary | ICD-10-CM

## 2022-05-17 DIAGNOSIS — Z01818 Encounter for other preprocedural examination: Secondary | ICD-10-CM | POA: Diagnosis present

## 2022-05-17 DIAGNOSIS — I10 Essential (primary) hypertension: Secondary | ICD-10-CM

## 2022-05-17 DIAGNOSIS — E119 Type 2 diabetes mellitus without complications: Secondary | ICD-10-CM | POA: Diagnosis not present

## 2022-05-17 HISTORY — DX: Depression, unspecified: F32.A

## 2022-05-17 HISTORY — DX: Pneumonia, unspecified organism: J18.9

## 2022-05-17 LAB — CBC
HCT: 42.6 % (ref 36.0–46.0)
Hemoglobin: 14.4 g/dL (ref 12.0–15.0)
MCH: 31.4 pg (ref 26.0–34.0)
MCHC: 33.8 g/dL (ref 30.0–36.0)
MCV: 92.8 fL (ref 80.0–100.0)
Platelets: 205 10*3/uL (ref 150–400)
RBC: 4.59 MIL/uL (ref 3.87–5.11)
RDW: 13.3 % (ref 11.5–15.5)
WBC: 10.3 10*3/uL (ref 4.0–10.5)
nRBC: 0 % (ref 0.0–0.2)

## 2022-05-17 LAB — BASIC METABOLIC PANEL
Anion gap: 9 (ref 5–15)
BUN: 15 mg/dL (ref 6–20)
CO2: 25 mmol/L (ref 22–32)
Calcium: 9.5 mg/dL (ref 8.9–10.3)
Chloride: 107 mmol/L (ref 98–111)
Creatinine, Ser: 0.73 mg/dL (ref 0.44–1.00)
GFR, Estimated: >60 mL/min (ref >60)
Glucose, Bld: 107 mg/dL — ABNORMAL HIGH (ref 70–99)
Potassium: 4.4 mmol/L (ref 3.5–5.1)
Sodium: 141 mmol/L (ref 135–145)

## 2022-05-17 LAB — TYPE AND SCREEN
ABO/RH(D): O NEG
Antibody Screen: NEGATIVE

## 2022-05-17 LAB — SURGICAL PCR SCREEN
MRSA, PCR: NEGATIVE
Staphylococcus aureus: NEGATIVE

## 2022-05-17 LAB — GLUCOSE, CAPILLARY: Glucose-Capillary: 111 mg/dL — ABNORMAL HIGH (ref 70–99)

## 2022-05-17 NOTE — Progress Notes (Addendum)
For Short Stay: [place] appointment date: Date of COVID positive in last 70 days:  Bowel Prep reminder:   For Anesthesia: PCP - Dr. Micheline Rough Cardiologist -   Chest x-ray - 03/21/22 EKG -  Stress Test -  ECHO -  Cardiac Cath -  Pacemaker/ICD device last checked: Pacemaker orders received: Device Rep notified:  Spinal Cord Stimulator:  Sleep Study - Yes CPAP - Yes  Fasting Blood Sugar - N/A Checks Blood Sugar ___0__ times a day Date and result of last Hgb A1c- 6.8: 04/11/22  Blood Thinner Instructions: Aspirin Instructions: Last Dose:  Activity level: Can go up a flight of stairs and activities of daily living without stopping and without chest pain and/or shortness of breath   Able to exercise without chest pain and/or shortness of breath   Unable to go up a flight of stairs without chest pain and/or shortness of breath     Anesthesia review: Hx: DIA,HTN,Smoker,OSA (CPAP)  Patient denies shortness of breath, fever, cough and chest pain at PAT appointment   Patient verbalized understanding of instructions that were given to them at the PAT appointment. Patient was also instructed that they will need to review over the PAT instructions again at home before surgery.

## 2022-05-19 ENCOUNTER — Other Ambulatory Visit: Payer: Self-pay | Admitting: *Deleted

## 2022-05-19 DIAGNOSIS — D72829 Elevated white blood cell count, unspecified: Secondary | ICD-10-CM

## 2022-05-22 ENCOUNTER — Other Ambulatory Visit: Payer: Self-pay

## 2022-05-22 ENCOUNTER — Inpatient Hospital Stay: Payer: 59 | Attending: Oncology | Admitting: Oncology

## 2022-05-22 ENCOUNTER — Inpatient Hospital Stay: Payer: 59

## 2022-05-22 VITALS — BP 121/78 | HR 69 | Temp 98.1°F | Resp 18 | Ht 64.0 in | Wt 221.2 lb

## 2022-05-22 DIAGNOSIS — D72829 Elevated white blood cell count, unspecified: Secondary | ICD-10-CM

## 2022-05-22 DIAGNOSIS — E119 Type 2 diabetes mellitus without complications: Secondary | ICD-10-CM

## 2022-05-22 DIAGNOSIS — E785 Hyperlipidemia, unspecified: Secondary | ICD-10-CM

## 2022-05-22 DIAGNOSIS — G4733 Obstructive sleep apnea (adult) (pediatric): Secondary | ICD-10-CM | POA: Diagnosis not present

## 2022-05-22 DIAGNOSIS — I10 Essential (primary) hypertension: Secondary | ICD-10-CM

## 2022-05-22 LAB — CBC WITH DIFFERENTIAL (CANCER CENTER ONLY)
Abs Immature Granulocytes: 0.03 10*3/uL (ref 0.00–0.07)
Basophils Absolute: 0.1 10*3/uL (ref 0.0–0.1)
Basophils Relative: 1 %
Eosinophils Absolute: 0.2 10*3/uL (ref 0.0–0.5)
Eosinophils Relative: 2 %
HCT: 43.6 % (ref 36.0–46.0)
Hemoglobin: 14.4 g/dL (ref 12.0–15.0)
Immature Granulocytes: 0 %
Lymphocytes Relative: 42 %
Lymphs Abs: 4.7 10*3/uL — ABNORMAL HIGH (ref 0.7–4.0)
MCH: 30.5 pg (ref 26.0–34.0)
MCHC: 33 g/dL (ref 30.0–36.0)
MCV: 92.4 fL (ref 80.0–100.0)
Monocytes Absolute: 0.5 10*3/uL (ref 0.1–1.0)
Monocytes Relative: 4 %
Neutro Abs: 5.6 10*3/uL (ref 1.7–7.7)
Neutrophils Relative %: 51 %
Platelet Count: 200 10*3/uL (ref 150–400)
RBC: 4.72 MIL/uL (ref 3.87–5.11)
RDW: 13.5 % (ref 11.5–15.5)
WBC Count: 11 10*3/uL — ABNORMAL HIGH (ref 4.0–10.5)
nRBC: 0 % (ref 0.0–0.2)

## 2022-05-22 LAB — LACTATE DEHYDROGENASE: LDH: 127 U/L (ref 98–192)

## 2022-05-22 LAB — SAVE SMEAR(SSMR), FOR PROVIDER SLIDE REVIEW

## 2022-05-22 NOTE — Progress Notes (Signed)
[facility] New Patient Consult   Requesting MD: Caren Macadam, Md [place],  [place] [address]   Lori [name] 56 y.o.  Apr 14, 1966    Reason for Consult: Leukocytosis   HPI: Lori [name] had a CBC on 04/11/2022.  The white count returned at 8.9, hemoglobin 14.2, platelets 209,000 with the white cell differential confirming absolute neutrophil count of 3.2 and absolute lymphocyte count of 4.9. Review of the medical record indicates a chronic history of mild leukocytosis/lymphocytosis.  She is scheduled for right hip replaced meant surgery next week for management of a labrum tear.  She feels well at present.  Past Medical History:  Diagnosis Date   ALLERGIC RHINITIS 03/14/2010   BACK PAIN 01/24/2010   Buttock pain    Constipation    Depression    Diabetes ([place])    Fatty liver    GERD 12/01/2008   HBP (high blood pressure)    HIATAL HERNIA 05/27/2010   Hot flashes    Hyperlipidemia    Hypertrophy of tongue papillae 03/14/2010   IBS (irritable bowel syndrome)    Joint pain    Numbness and tingling of left upper and lower extremity    OSTEOARTHRITIS 12/01/2008   Osteoporosis 05/17/2020   Fragility fracture plus T score -1.8 on DEXA scan June 2021   Pneumonia    Sciatica, left side    Sleep apnea    Sleep apnea    Thigh pain    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   TOBACCO USE 12/01/2008   VITAMIN D DEFICIENCY 05/23/2010    .  G2, P1, 1 miscarriage  Past Surgical History:  Procedure Laterality Date   ABDOMINAL HYSTERECTOMY N/A 10/17/2016   Procedure: HYSTERECTOMY ABDOMINAL;  Surgeon: Sanjuana Kava, MD;  Location: [facility]; benign mass in uterus   APPENDECTOMY     BREAST BIOPSY Right    BREAST EXCISIONAL BIOPSY Left    BREAST SURGERY     lumpectomy   CARPAL TUNNEL RELEASE     CERVICAL FUSION     x2; 2007, 2011   [place] N/A 10/17/2016   Procedure: CYSTOSCOPY;  Surgeon: Sanjuana Kava, MD;   Location: [facility];  Service: Gynecology;  Laterality: N/A;   DILATION AND CURETTAGE OF UTERUS     miscarriage   ESOPHAGEAL MANOMETRY     ESOPHAGOGASTRODUODENOSCOPY     GASTRIC FUNDOPLICATION     Nissen   HERNIA REPAIR     LAPAROSCOPIC NISSEN FUNDOPLICATION     LUMBAR [place] SURGERY     LUMBAR LAMINECTOMY  2005   SALPINGOOPHORECTOMY Bilateral 10/17/2016   Procedure: SALPINGO OOPHORECTOMY;  Surgeon: Sanjuana Kava, MD;  Location: [facility];  Service: Gynecology;  Laterality: Bilateral;   TONSILLECTOMY AND ADENOIDECTOMY     UPPER GASTROINTESTINAL ENDOSCOPY      Medications: Reviewed  Allergies:  Allergies  Allergen Reactions   Amoxicillin Nausea And Vomiting    Yeast Infection, severe nausea and vomiting    Eggs Or Egg-Derived Products Nausea And Vomiting    With scrambled or cooked eggs (can eat if baked into something i.e. cakes)   Latex Rash   Morphine Anxiety and Other (See Comments)    REACTION: hyper REACTION: hyper   Torecan [Thiethylperazine] Palpitations and Other (See Comments)    Family history: Her mother had breast cancer  Social History:   She lives with her husband in [place].  She works in Libr[employer], academic.  She smokes 1/2 pack of cigarettes per day.  Rare alcohol use.  No transfusion history.  No risk factor for HIV or hepatitis.  ROS:   Positives include: Chronic low back pain, occasional solid odynophagia, chronic constipation, swelling in the bilateral parotid/submandibular areas, chronic "lump "at the left posterior neck  A complete ROS was otherwise negative.  Physical Exam:  Blood pressure 121/78, pulse 69, temperature 98.1 F (36.7 C), temperature source Oral, resp. rate 18, height '5\' 4"'$  (1.626 m), weight 221 lb 3.2 oz (100.3 kg), last menstrual period 09/27/2016, SpO2 99 %.  HEENT: Oropharynx without visible mass, neck without mass, the parotid glands appear symmetric Lungs: Clear bilaterally Cardiac: Regular rate and rhythm Abdomen: No  hepatosplenomegaly, nontender, no mass  Vascular: No leg edema Lymph nodes: No cervical, supraclavicular, axillary, or inguinal nodes.  1 cm subcutaneous nodular lesion at the left occiput-cyst versus lymph node Neurologic: Alert and oriented Skin: No rash Musculoskeletal: No spine tenderness   LAB:  CBC  Lab Results  Component Value Date   WBC 11.0 (H) 05/22/2022   HGB 14.4 05/22/2022   HCT 43.6 05/22/2022   MCV 92.4 05/22/2022   PLT 200 05/22/2022   NEUTROABS 5.6 05/22/2022    Blood smear: The red cell morphology is unremarkable.  The plates are normal in number.  The majority the white cells are mature neutrophils and lymphocytes.  Rare smudge cell.    CMP  Lab Results  Component Value Date   NA 141 05/17/2022   K 4.4 05/17/2022   CL 107 05/17/2022   CO2 25 05/17/2022   GLUCOSE 107 (H) 05/17/2022   BUN 15 05/17/2022   CREATININE 0.73 05/17/2022   CALCIUM 9.5 05/17/2022   PROT 7.3 04/11/2022   ALBUMIN 4.5 04/11/2022   AST 35 04/11/2022   ALT 39 (H) 04/11/2022   ALKPHOS 67 04/11/2022   BILITOT 0.4 04/11/2022   GFRNONAA >60 05/17/2022   GFRAA 113 11/22/2020       Assessment/Plan:   Lymphocytosis-chronic and mild  Ongoing tobacco use Chronic back pain Diabetes Hyperlipidemia Sleep apnea Hypertension   Disposition:   Lori [name] is referred for evaluation of mild lymphocytosis.  The lymphocytosis appears longstanding.  The differential diagnosis includes a benign normal variant, lymphocytosis related to tobacco use, a lymphoproliferative disorder such as CLL, or a monoclonal B-cell lymphocytosis.  We will submit peripheral blood for flow cytometry to look for evidence of chronic lymphocytic leukemia.  We will obtain quantitative immunoglobulin levels and a serum immunofixation today.  If she has a clonal B-cell population marking for CLL, the lymphocyte count is just below the level to establish a diagnosis of CLL.  She appears asymptomatic and no  treatment is indicated at present.  I recommended she stay up-to-date on pneumococcal, influenza, and COVID-19 vaccines.  She will return for an office visit and CBC in 3-4 months.  We will contact her with results from the laboratory evaluation today.   Betsy Coder, MD  05/22/2022, 11:06 AM

## 2022-05-23 ENCOUNTER — Ambulatory Visit
Admission: RE | Admit: 2022-05-23 | Discharge: 2022-05-23 | Disposition: A | Discharge status: Home / Self Care | Payer: 59 | Source: Ambulatory Visit | Attending: Family Medicine | Admitting: Family Medicine

## 2022-05-23 DIAGNOSIS — E2839 Other primary ovarian failure: Secondary | ICD-10-CM

## 2022-05-23 LAB — SURGICAL PATHOLOGY

## 2022-05-24 ENCOUNTER — Telehealth: Payer: Self-pay

## 2022-05-24 NOTE — Telephone Encounter (Signed)
-----   Message from Ladell Pier, MD sent at 05/23/2022  8:58 PM EDT ----- Please call patient, the peripheral blood flow cytometry does not reveal CLL or lymphoproliferative disorder, follow-up as scheduled

## 2022-05-24 NOTE — H&P (Signed)
TOTAL HIP ADMISSION H&P  Patient is admitted for right total hip arthroplasty.  Subjective:  Chief Complaint: right hip pain  HPI: Lori [name], 56 y.o. female, has a history of pain and functional disability in the right hip(s) due to arthritis and patient has failed non-surgical conservative treatments for greater than 12 weeks to include NSAID's and/or analgesics, corticosteriod injections, flexibility and strengthening excercises, supervised PT with diminished ADL's post treatment, use of assistive devices, weight reduction as appropriate, and activity modification.  Onset of symptoms was gradual starting 5 years ago with gradually worsening course since that time.The patient noted no past surgery on the right hip(s).  Patient currently rates pain in the right hip at 10 out of 10 with activity. Patient has night pain, worsening of pain with activity and weight bearing, trendelenberg gait, pain that interfers with activities of daily living, and pain with passive range of motion. Patient has evidence of subchondral cysts, subchondral sclerosis, periarticular osteophytes, and joint space narrowing by imaging studies. This condition presents safety issues increasing the risk of falls. There is no current active infection.  Patient Active Problem List   Diagnosis Date Noted   Degenerative arthritis of knee, bilateral 10/20/2021   Degenerative disc disease, cervical 07/06/2021   Lumbar arthritis 07/06/2021   Somatic dysfunction of spine, cervical 05/19/2021   ADD (attention deficit disorder) 02/28/2021   Primary osteoarthritis of right hip 06/09/2020   Osteoporosis 05/17/2020   Closed fracture of right pubis ([place]) 04/22/2020   Tear of right acetabular labrum 04/22/2020   Chronic right hip pain 04/19/2020   Diabetes mellitus type II, controlled ([place]) 02/20/2020   Hyperlipidemia associated with type 2 diabetes mellitus ([place]) 02/20/2020   Hypertension 02/03/2020   Hepatic steatosis 02/03/2020    Morbid obesity ([place]) 02/07/2018   S/P TAH-BSO (total abdominal hysterectomy and bilateral salpingo-oophorectomy) 10/17/2016   OSA (obstructive sleep apnea) 08/10/2016   HIATAL HERNIA 05/27/2010   Vitamin D deficiency 05/23/2010   Allergic rhinitis 03/14/2010   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   Backache 01/24/2010   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   TOBACCO USE 12/01/2008   GERD 12/01/2008   Osteoarthritis 12/01/2008   Past Medical History:  Diagnosis Date   ALLERGIC RHINITIS 03/14/2010   BACK PAIN 01/24/2010   Buttock pain    Constipation    Depression    Diabetes ([place])    Fatty liver    GERD 12/01/2008   HBP (high blood pressure)    HIATAL HERNIA 05/27/2010   Hot flashes    Hyperlipidemia    Hypertrophy of tongue papillae 03/14/2010   IBS (irritable bowel syndrome)    Joint pain    Numbness and tingling of left upper and lower extremity    OSTEOARTHRITIS 12/01/2008   Osteoporosis 05/17/2020   Fragility fracture plus T score -1.8 on DEXA scan June 2021   Pneumonia    Sciatica, left side    Sleep apnea    Sleep apnea    Thigh pain    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   TOBACCO USE 12/01/2008   VITAMIN D DEFICIENCY 05/23/2010    Past Surgical History:  Procedure Laterality Date   ABDOMINAL HYSTERECTOMY N/A 10/17/2016   Procedure: HYSTERECTOMY ABDOMINAL;  Surgeon: Sanjuana Kava, MD;  Location: [facility]; benign mass in uterus   APPENDECTOMY     BREAST BIOPSY Right    BREAST EXCISIONAL BIOPSY Left    BREAST SURGERY     lumpectomy   CARPAL TUNNEL RELEASE     CERVICAL  FUSION     x2; 2007, 2011   [place]   COLONOSCOPY     CYSTOSCOPY N/A 10/17/2016   Procedure: CYSTOSCOPY;  Surgeon: Sanjuana Kava, MD;  Location: [facility];  Service: Gynecology;  Laterality: N/A;   DILATION AND CURETTAGE OF UTERUS     miscarriage   ESOPHAGEAL MANOMETRY     ESOPHAGOGASTRODUODENOSCOPY     GASTRIC FUNDOPLICATION     [place]issen   HERNIA REPAIR     LAPAROSCOPIC NISSEN  FUNDOPLICATION     LUMBAR [place] SURGERY     LUMBAR LAMINECTOMY  2005   SALPINGOOPHORECTOMY Bilateral 10/17/2016   Procedure: SALPINGO OOPHORECTOMY;  Surgeon: Sanjuana Kava, MD;  Location: [facility];  Service: Gynecology;  Laterality: Bilateral;   TONSILLECTOMY AND ADENOIDECTOMY     UPPER GASTROINTESTINAL ENDOSCOPY      Current Facility-Administered Medications  Medication Dose Route Frequency Provider Last Rate Last Admin   0.9 %  sodium chloride infusion  500 mL Intravenous Once [place], MD       Current Outpatient Medications  Medication Sig Dispense Refill Last Dose   Brimonidine Tartrate (LUMIFY) 0.025 % SOLN Place 1 drop into both eyes in the morning and at bedtime.      cetirizine (ZYRTEC) 10 MG tablet Take 10 mg by mouth daily as needed for allergies.      diazepam (VALIUM) 5 MG tablet TAKE 1 TABLET BY MOUTH EVERY 12 HOURS AS NEEDED FOR ANXIETY OR MUSCLE SPASMS Strength: 5 mg 60 tablet 0    fluticasone (FLONASE) 50 MCG/ACT nasal spray Place 2 sprays into both nostrils daily as needed for allergies.      hydrochlorothiazide (HYDRODIURIL) 25 MG tablet TAKE 1 TABLET (25 MG TOTAL) BY MOUTH DAILY. (Patient taking differently: Take 25 mg by mouth in the morning.) 90 tablet 1    lidocaine (LIDODERM) 5 % Place 1 patch onto the skin daily. Remove & Discard patch within 12 hours or as directed by MD (back/hip pain)      losartan (COZAAR) 50 MG tablet TAKE 1 TABLET BY MOUTH EVERY DAY (Patient taking differently: Take 50 mg by mouth every evening.) 90 tablet 1    meloxicam (MOBIC) 15 MG tablet Take 15 mg by mouth daily after lunch.      oxyCODONE-acetaminophen (PERCOCET) 10-325 MG tablet Take 1 tablet by mouth 5 (five) times daily as needed for pain.      pseudoephedrine (SUDAFED) 30 MG tablet Take 30-60 mg by mouth every 4 (four) hours as needed (headaches).      rosuvastatin (CRESTOR) 20 MG tablet Take 1 tablet (20 mg total) by mouth daily. (Patient taking differently: Take 20 mg by mouth  every evening.) 90 tablet 1    tirzepatide (MOUNJARO) 2.5 MG/0.5ML Pen Inject 2.5 mg into the skin every Sunday.      varenicline (CHANTIX) 0.5 MG tablet Start with 1 tablet daily x 3 days, then increase to 1 tablet PO BID x 4 days, then increase to 2 tablet PO BID (Patient not taking: Reported on 05/15/2022) 120 tablet 1    Allergies  Allergen Reactions   Amoxicillin Nausea And Vomiting    Yeast Infection, severe nausea and vomiting    Eggs Or Egg-Derived Products Nausea And Vomiting    With scrambled or cooked eggs (can eat if baked into something i.e. cakes)   Latex Rash   Morphine Anxiety and Other (See Comments)    REACTION: hyper REACTION: hyper   Torecan [Thiethylperazine] Palpitations and Other (See  Comments)    Social History   Tobacco Use   Smoking status: Every Day    Packs/day: 1.00    Years: 36.00    Total pack years: 36.00    Types: Cigarettes   Smokeless tobacco: Never  Substance Use Topics   Alcohol use: Yes    Comment: social    Family History  Problem Relation Age of Onset   Breast cancer Mother 64   High blood pressure Mother    High Cholesterol Mother    Diabetes Mother    Diverticulitis Mother 75   Heart attack Mother 57       during hospitalization with diverticulitis   Heart disease Mother    Obesity Mother    Alzheimer's disease Father    Alcohol abuse Father    Sleep apnea Father    Colon polyps Half-Sister    Colonic polyp Half-Brother    Esophageal cancer Neg Hx    Colon cancer Neg Hx    Rectal cancer Neg Hx    Stomach cancer Neg Hx      Review of Systems  Musculoskeletal:  Positive for arthralgias.       Right hip  All other systems reviewed and are negative.   Objective:  Physical Exam Constitutional:      Appearance: Normal appearance. She is normal weight.  HENT:     Head: Normocephalic and atraumatic.     Nose: Nose normal.     Mouth/Throat:     Pharynx: Oropharynx is clear.  Eyes:     Extraocular Movements:  Extraocular movements intact.  Cardiovascular:     Rate and Rhythm: Normal rate and regular rhythm.  Pulmonary:     Effort: Pulmonary effort is normal.  Abdominal:     Palpations: Abdomen is soft.  Musculoskeletal:     Cervical back: Normal range of motion.     Comments: Right hip motion remains fairly good though very painful especially with internal rotation.  She has a great deal of groin pain and some lesser buttock pain.  Opposite hip moves well.  Her BMI is 38.  She has normal unlabored respirations.  Sensation and motor function are intact in her feet with palpable pulses on both sides.    Skin:    General: Skin is warm and dry.  Neurological:     General: No focal deficit present.     Mental Status: She is alert and oriented to person, place, and time. Mental status is at baseline.  Psychiatric:        Mood and Affect: Mood normal.        Behavior: Behavior normal.        Thought Content: Thought content normal.        Judgment: Judgment normal.     Vital signs in last 24 hours:    Labs:   Estimated body mass index is 37.97 kg/m as calculated from the following:   Height as of 05/22/22: '5\' 4"'$  (1.626 m).   Weight as of 05/22/22: 100.3 kg.   Imaging Review Plain radiographs demonstrate severe degenerative joint disease of the right hip(s). The bone quality appears to be good for age and reported activity level.      Assessment/Plan:  End stage primary arthritis, right hip(s)  The patient history, physical examination, clinical judgement of the provider and imaging studies are consistent with end stage degenerative joint disease of the right hip(s) and total hip arthroplasty is deemed medically necessary. The treatment options including medical  management, injection therapy, arthroscopy and arthroplasty were discussed at length. The risks and benefits of total hip arthroplasty were presented and reviewed. The risks due to aseptic loosening, infection, stiffness,  dislocation/subluxation,  thromboembolic complications and other imponderables were discussed.  The patient acknowledged the explanation, agreed to proceed with the plan and consent was signed. Patient is being admitted for inpatient treatment for surgery, pain control, PT, OT, prophylactic antibiotics, VTE prophylaxis, progressive ambulation and ADL's and discharge planning.The patient is planning to be discharged home with home health services

## 2022-05-24 NOTE — Telephone Encounter (Signed)
Pt verbalized understanding.

## 2022-05-25 ENCOUNTER — Other Ambulatory Visit: Payer: 59

## 2022-05-25 LAB — IMMUNOFIXATION ELECTROPHORESIS
IgA: 267 mg/dL (ref 87–352)
IgG (Immunoglobin G), Serum: 799 mg/dL (ref 586–1602)
IgM (Immunoglobulin M), Srm: 84 mg/dL (ref 26–217)
Total Protein ELP: 7 g/dL (ref 6.0–8.5)

## 2022-05-26 LAB — FLOW CYTOMETRY

## 2022-05-29 MED ORDER — TRANEXAMIC ACID 1000 MG/10ML IV SOLN
2000.0000 mg | INTRAVENOUS | Status: DC
  Filled 2022-05-29: qty 20

## 2022-05-29 NOTE — Anesthesia Preprocedure Evaluation (Signed)
Anesthesia Evaluation  Patient identified by MRN, date of birth, ID band Patient awake    Reviewed: Allergy & Precautions, H&P , NPO status , Patient's Chart, lab work & pertinent test results  Airway Mallampati: III  TM Distance: >3 FB Neck ROM: Full    Dental no notable dental hx. (+) Teeth Intact, Dental Advisory Given   Pulmonary sleep apnea and Continuous Positive Airway Pressure Ventilation , Current Smoker and Patient abstained from smoking.,    Pulmonary exam normal breath sounds clear to auscultation       Cardiovascular Exercise Tolerance: Good hypertension, Pt. on medications  Rhythm:Regular Rate:Normal     Neuro/Psych Depression negative neurological ROS     GI/Hepatic Neg liver ROS, GERD  ,  Endo/Other  diabetes, Type 2, Oral Hypoglycemic AgentsMorbid obesity  Renal/GU negative Renal ROS  negative genitourinary   Musculoskeletal  (+) Arthritis , Osteoarthritis,    Abdominal   Peds  Hematology negative hematology ROS (+)   Anesthesia Other Findings   Reproductive/Obstetrics negative OB ROS                            Anesthesia Physical Anesthesia Plan  ASA: 3  Anesthesia Plan: General   Post-op Pain Management: Tylenol PO (pre-op)* and Ketamine IV*   Induction: Intravenous  PONV Risk Score and Plan: 2 and Propofol infusion, Midazolam and Ondansetron  Airway Management Planned: Oral ETT  Additional Equipment:   Intra-op Plan:   Post-operative Plan: Extubation in OR  Informed Consent: I have reviewed the patients History and Physical, chart, labs and discussed the procedure including the risks, benefits and alternatives for the proposed anesthesia with the patient or authorized representative who has indicated his/her understanding and acceptance.     Dental advisory given  Plan Discussed with: CRNA  Anesthesia Plan Comments: (Pt refuses spinal due to previous  difficulty with her pain management physician placing epidural steroids.)      Anesthesia Quick Evaluation

## 2022-05-30 ENCOUNTER — Ambulatory Visit (HOSPITAL_COMMUNITY)
Admission: RE | Admit: 2022-05-30 | Discharge: 2022-05-30 | Disposition: A | Discharge status: Home / Self Care | Payer: 59 | Attending: Orthopaedic Surgery | Admitting: Orthopaedic Surgery

## 2022-05-30 ENCOUNTER — Encounter (HOSPITAL_COMMUNITY)
Admission: RE | Disposition: A | Discharge status: Home / Self Care | Payer: Self-pay | Source: Home / Self Care | Attending: Orthopaedic Surgery

## 2022-05-30 ENCOUNTER — Ambulatory Visit (HOSPITAL_COMMUNITY): Payer: 59 | Admitting: Certified Registered"

## 2022-05-30 ENCOUNTER — Other Ambulatory Visit: Payer: Self-pay

## 2022-05-30 ENCOUNTER — Encounter (HOSPITAL_COMMUNITY): Payer: Self-pay | Admitting: Orthopaedic Surgery

## 2022-05-30 ENCOUNTER — Ambulatory Visit (HOSPITAL_COMMUNITY): Payer: 59

## 2022-05-30 ENCOUNTER — Ambulatory Visit (HOSPITAL_BASED_OUTPATIENT_CLINIC_OR_DEPARTMENT_OTHER): Payer: 59 | Admitting: Certified Registered"

## 2022-05-30 DIAGNOSIS — Z7984 Long term (current) use of oral hypoglycemic drugs: Secondary | ICD-10-CM

## 2022-05-30 DIAGNOSIS — E785 Hyperlipidemia, unspecified: Secondary | ICD-10-CM | POA: Insufficient documentation

## 2022-05-30 DIAGNOSIS — M1611 Unilateral primary osteoarthritis, right hip: Secondary | ICD-10-CM | POA: Diagnosis present

## 2022-05-30 DIAGNOSIS — R2681 Unsteadiness on feet: Secondary | ICD-10-CM | POA: Insufficient documentation

## 2022-05-30 DIAGNOSIS — I10 Essential (primary) hypertension: Secondary | ICD-10-CM | POA: Insufficient documentation

## 2022-05-30 DIAGNOSIS — E119 Type 2 diabetes mellitus without complications: Secondary | ICD-10-CM

## 2022-05-30 DIAGNOSIS — K219 Gastro-esophageal reflux disease without esophagitis: Secondary | ICD-10-CM | POA: Diagnosis not present

## 2022-05-30 DIAGNOSIS — G4730 Sleep apnea, unspecified: Secondary | ICD-10-CM | POA: Insufficient documentation

## 2022-05-30 DIAGNOSIS — F17210 Nicotine dependence, cigarettes, uncomplicated: Secondary | ICD-10-CM

## 2022-05-30 HISTORY — PX: TOTAL HIP ARTHROPLASTY: SHX124

## 2022-05-30 LAB — GLUCOSE, CAPILLARY: Glucose-Capillary: 156 mg/dL — ABNORMAL HIGH (ref 70–99)

## 2022-05-30 LAB — ABO/RH: ABO/RH(D): O NEG

## 2022-05-30 SURGERY — ARTHROPLASTY, HIP, TOTAL, ANTERIOR APPROACH
Anesthesia: General | Site: Hip | Laterality: Right

## 2022-05-30 MED ORDER — ROCURONIUM BROMIDE 10 MG/ML (PF) SYRINGE
PREFILLED_SYRINGE | INTRAVENOUS | Status: AC
  Filled 2022-05-30: qty 10

## 2022-05-30 MED ORDER — ACETAMINOPHEN 10 MG/ML IV SOLN
1000.0000 mg | Freq: Once | INTRAVENOUS | Status: AC
Start: 2022-05-30 — End: 2022-05-30
  Administered 2022-05-30: 1000 mg via INTRAVENOUS

## 2022-05-30 MED ORDER — DEXAMETHASONE SODIUM PHOSPHATE 10 MG/ML IJ SOLN
INTRAMUSCULAR | Status: AC
  Filled 2022-05-30: qty 1

## 2022-05-30 MED ORDER — TRANEXAMIC ACID-NACL 1000-0.7 MG/100ML-% IV SOLN
INTRAVENOUS | Status: AC
  Filled 2022-05-30: qty 100

## 2022-05-30 MED ORDER — DEXAMETHASONE SODIUM PHOSPHATE 10 MG/ML IJ SOLN
INTRAMUSCULAR | Status: DC | PRN
  Administered 2022-05-30: 8 mg via INTRAVENOUS

## 2022-05-30 MED ORDER — LACTATED RINGERS IV SOLN: INTRAVENOUS | Status: DC

## 2022-05-30 MED ORDER — DIPHENHYDRAMINE HCL 50 MG/ML IJ SOLN
INTRAMUSCULAR | Status: DC | PRN
  Administered 2022-05-30: 6.25 mg via INTRAVENOUS

## 2022-05-30 MED ORDER — ACETAMINOPHEN 325 MG PO TABS: 325.0000 mg | ORAL_TABLET | Freq: Four times a day (QID) | ORAL | Status: DC | PRN

## 2022-05-30 MED ORDER — PROPOFOL 10 MG/ML IV BOLUS
INTRAVENOUS | Status: DC | PRN
  Administered 2022-05-30: 150 mg via INTRAVENOUS

## 2022-05-30 MED ORDER — BUPIVACAINE-EPINEPHRINE (PF) 0.25% -1:200000 IJ SOLN
INTRAMUSCULAR | Status: AC
  Filled 2022-05-30: qty 30

## 2022-05-30 MED ORDER — KETAMINE HCL 50 MG/5ML IJ SOSY
PREFILLED_SYRINGE | INTRAMUSCULAR | Status: AC
  Filled 2022-05-30: qty 5

## 2022-05-30 MED ORDER — HYDROMORPHONE HCL 1 MG/ML IJ SOLN
INTRAMUSCULAR | Status: AC
  Filled 2022-05-30: qty 1

## 2022-05-30 MED ORDER — DIPHENHYDRAMINE HCL 50 MG/ML IJ SOLN
INTRAMUSCULAR | Status: AC
  Filled 2022-05-30: qty 1

## 2022-05-30 MED ORDER — HYDROMORPHONE HCL 2 MG/ML IJ SOLN
INTRAMUSCULAR | Status: AC
  Filled 2022-05-30: qty 1

## 2022-05-30 MED ORDER — ACETAMINOPHEN 10 MG/ML IV SOLN
INTRAVENOUS | Status: AC
  Filled 2022-05-30: qty 100

## 2022-05-30 MED ORDER — MIDAZOLAM HCL 2 MG/2ML IJ SOLN
2.0000 mg | Freq: Once | INTRAMUSCULAR | Status: AC
  Administered 2022-05-30: 2 mg via INTRAVENOUS

## 2022-05-30 MED ORDER — FENTANYL CITRATE PF 50 MCG/ML IJ SOSY
25.0000 ug | PREFILLED_SYRINGE | INTRAMUSCULAR | Status: DC | PRN
  Administered 2022-05-30 (×3): 50 ug via INTRAVENOUS

## 2022-05-30 MED ORDER — ONDANSETRON HCL 4 MG/2ML IJ SOLN
INTRAMUSCULAR | Status: DC | PRN
  Administered 2022-05-30: 4 mg via INTRAVENOUS

## 2022-05-30 MED ORDER — BUPIVACAINE LIPOSOME 1.3 % IJ SUSP
INTRAMUSCULAR | Status: DC | PRN
  Administered 2022-05-30: 10 mL

## 2022-05-30 MED ORDER — METOCLOPRAMIDE HCL 5 MG PO TABS: 5.0000 mg | ORAL_TABLET | Freq: Three times a day (TID) | ORAL | Status: DC | PRN

## 2022-05-30 MED ORDER — LIDOCAINE HCL (PF) 2 % IJ SOLN
INTRAMUSCULAR | Status: AC
  Filled 2022-05-30: qty 5

## 2022-05-30 MED ORDER — ONDANSETRON HCL 4 MG/2ML IJ SOLN: 4.0000 mg | Freq: Four times a day (QID) | INTRAMUSCULAR | Status: DC | PRN

## 2022-05-30 MED ORDER — 0.9 % SODIUM CHLORIDE (POUR BTL) OPTIME
TOPICAL | Status: DC | PRN
  Administered 2022-05-30: 1000 mL

## 2022-05-30 MED ORDER — ROCURONIUM BROMIDE 100 MG/10ML IV SOLN
INTRAVENOUS | Status: DC | PRN
  Administered 2022-05-30: 60 mg via INTRAVENOUS

## 2022-05-30 MED ORDER — POVIDONE-IODINE 10 % EX SWAB
2.0000 | Freq: Once | CUTANEOUS | Status: AC
  Administered 2022-05-30: 2 via TOPICAL

## 2022-05-30 MED ORDER — LACTATED RINGERS IV BOLUS: 250.0000 mL | Freq: Once | INTRAVENOUS | Status: DC

## 2022-05-30 MED ORDER — FENTANYL CITRATE (PF) 250 MCG/5ML IJ SOLN
INTRAMUSCULAR | Status: AC
  Filled 2022-05-30: qty 5

## 2022-05-30 MED ORDER — VANCOMYCIN HCL IN DEXTROSE 1-5 GM/200ML-% IV SOLN
1000.0000 mg | INTRAVENOUS | Status: AC
  Administered 2022-05-30: 1000 mg via INTRAVENOUS
  Filled 2022-05-30: qty 200

## 2022-05-30 MED ORDER — OXYCODONE HCL 5 MG PO TABS
5.0000 mg | ORAL_TABLET | ORAL | Status: DC | PRN
  Administered 2022-05-30: 10 mg via ORAL

## 2022-05-30 MED ORDER — TRANEXAMIC ACID-NACL 1000-0.7 MG/100ML-% IV SOLN
1000.0000 mg | Freq: Once | INTRAVENOUS | Status: AC
  Administered 2022-05-30: 1000 mg via INTRAVENOUS

## 2022-05-30 MED ORDER — HYDROMORPHONE HCL 1 MG/ML IJ SOLN
0.2500 mg | INTRAMUSCULAR | Status: DC | PRN
  Administered 2022-05-30 (×5): 0.5 mg via INTRAVENOUS

## 2022-05-30 MED ORDER — FENTANYL CITRATE PF 50 MCG/ML IJ SOSY
PREFILLED_SYRINGE | INTRAMUSCULAR | Status: AC
  Filled 2022-05-30: qty 2

## 2022-05-30 MED ORDER — MIDAZOLAM HCL 2 MG/2ML IJ SOLN
INTRAMUSCULAR | Status: AC
  Filled 2022-05-30: qty 2

## 2022-05-30 MED ORDER — BUPIVACAINE LIPOSOME 1.3 % IJ SUSP
INTRAMUSCULAR | Status: AC
  Filled 2022-05-30: qty 10

## 2022-05-30 MED ORDER — OXYCODONE HCL 5 MG PO TABS
ORAL_TABLET | ORAL | Status: AC
  Filled 2022-05-30: qty 2

## 2022-05-30 MED ORDER — HYDROMORPHONE HCL 1 MG/ML IJ SOLN: 0.5000 mg | INTRAMUSCULAR | Status: DC | PRN

## 2022-05-30 MED ORDER — ORAL CARE MOUTH RINSE: 15.0000 mL | Freq: Once | OROMUCOSAL | Status: AC

## 2022-05-30 MED ORDER — TIZANIDINE HCL 4 MG PO TABS: 4.0000 mg | ORAL_TABLET | Freq: Four times a day (QID) | ORAL | 1 refills | Status: DC | PRN

## 2022-05-30 MED ORDER — DEXMEDETOMIDINE (PRECEDEX) IN NS 20 MCG/5ML (4 MCG/ML) IV SYRINGE
PREFILLED_SYRINGE | INTRAVENOUS | Status: DC | PRN
  Administered 2022-05-30 (×4): 4 ug via INTRAVENOUS

## 2022-05-30 MED ORDER — FENTANYL CITRATE (PF) 100 MCG/2ML IJ SOLN
INTRAMUSCULAR | Status: AC
  Filled 2022-05-30: qty 2

## 2022-05-30 MED ORDER — DEXMEDETOMIDINE (PRECEDEX) IN NS 20 MCG/5ML (4 MCG/ML) IV SYRINGE
PREFILLED_SYRINGE | INTRAVENOUS | Status: AC
  Filled 2022-05-30: qty 5

## 2022-05-30 MED ORDER — CEFAZOLIN SODIUM 1 G IJ SOLR
INTRAMUSCULAR | Status: AC
  Filled 2022-05-30: qty 20

## 2022-05-30 MED ORDER — METHOCARBAMOL 500 MG PO TABS: 500.0000 mg | ORAL_TABLET | Freq: Four times a day (QID) | ORAL | Status: DC | PRN

## 2022-05-30 MED ORDER — BUPIVACAINE-EPINEPHRINE (PF) 0.25% -1:200000 IJ SOLN
INTRAMUSCULAR | Status: DC | PRN
  Administered 2022-05-30: 30 mL

## 2022-05-30 MED ORDER — KETAMINE HCL 10 MG/ML IJ SOLN
INTRAMUSCULAR | Status: DC | PRN
  Administered 2022-05-30 (×3): 10 mg via INTRAVENOUS

## 2022-05-30 MED ORDER — CEFAZOLIN SODIUM-DEXTROSE 2-4 GM/100ML-% IV SOLN: 2.0000 g | Freq: Four times a day (QID) | INTRAVENOUS | Status: DC

## 2022-05-30 MED ORDER — OXYCODONE HCL 5 MG PO TABS: 10.0000 mg | ORAL_TABLET | ORAL | Status: DC | PRN

## 2022-05-30 MED ORDER — POVIDONE-IODINE 10 % EX SWAB: 2.0000 "application " | Freq: Once | CUTANEOUS | Status: DC

## 2022-05-30 MED ORDER — PROPOFOL 1000 MG/100ML IV EMUL
INTRAVENOUS | Status: AC
Start: 2022-05-30 — End: ?
  Filled 2022-05-30: qty 100

## 2022-05-30 MED ORDER — KETOROLAC TROMETHAMINE 15 MG/ML IJ SOLN: 15.0000 mg | Freq: Four times a day (QID) | INTRAMUSCULAR | Status: DC

## 2022-05-30 MED ORDER — CHLORHEXIDINE GLUCONATE 0.12 % MT SOLN
15.0000 mL | Freq: Once | OROMUCOSAL | Status: AC
  Administered 2022-05-30: 15 mL via OROMUCOSAL

## 2022-05-30 MED ORDER — METHOCARBAMOL 500 MG IVPB - SIMPLE MED
INTRAVENOUS | Status: AC
  Filled 2022-05-30: qty 50

## 2022-05-30 MED ORDER — FENTANYL CITRATE (PF) 100 MCG/2ML IJ SOLN
INTRAMUSCULAR | Status: DC | PRN
Start: 2022-05-30 — End: 2022-05-30
  Administered 2022-05-30: 100 ug via INTRAVENOUS
  Administered 2022-05-30: 50 ug via INTRAVENOUS
  Administered 2022-05-30: 100 ug via INTRAVENOUS
  Administered 2022-05-30 (×2): 25 ug via INTRAVENOUS

## 2022-05-30 MED ORDER — TRANEXAMIC ACID 1000 MG/10ML IV SOLN
INTRAVENOUS | Status: DC | PRN
  Administered 2022-05-30: 2000 mg via TOPICAL

## 2022-05-30 MED ORDER — METOCLOPRAMIDE HCL 5 MG/ML IJ SOLN: 5.0000 mg | Freq: Three times a day (TID) | INTRAMUSCULAR | Status: DC | PRN

## 2022-05-30 MED ORDER — METHOCARBAMOL 500 MG IVPB - SIMPLE MED
500.0000 mg | Freq: Four times a day (QID) | INTRAVENOUS | Status: DC | PRN
  Administered 2022-05-30: 500 mg via INTRAVENOUS

## 2022-05-30 MED ORDER — FENTANYL CITRATE PF 50 MCG/ML IJ SOSY
PREFILLED_SYRINGE | INTRAMUSCULAR | Status: AC
  Filled 2022-05-30: qty 1

## 2022-05-30 MED ORDER — MIDAZOLAM HCL 5 MG/5ML IJ SOLN
INTRAMUSCULAR | Status: DC | PRN
  Administered 2022-05-30: 2 mg via INTRAVENOUS

## 2022-05-30 MED ORDER — BUPIVACAINE LIPOSOME 1.3 % IJ SUSP: 10.0000 mL | Freq: Once | INTRAMUSCULAR | Status: DC

## 2022-05-30 MED ORDER — TRANEXAMIC ACID-NACL 1000-0.7 MG/100ML-% IV SOLN
1000.0000 mg | INTRAVENOUS | Status: AC
  Administered 2022-05-30: 1000 mg via INTRAVENOUS
  Filled 2022-05-30: qty 100

## 2022-05-30 MED ORDER — ACETAMINOPHEN 500 MG PO TABS: 1000.0000 mg | ORAL_TABLET | Freq: Four times a day (QID) | ORAL | Status: DC

## 2022-05-30 MED ORDER — CEFAZOLIN SODIUM-DEXTROSE 2-3 GM-%(50ML) IV SOLR
INTRAVENOUS | Status: DC | PRN
  Administered 2022-05-30: 2 g via INTRAVENOUS

## 2022-05-30 MED ORDER — ONDANSETRON HCL 4 MG/2ML IJ SOLN
INTRAMUSCULAR | Status: AC
  Filled 2022-05-30: qty 2

## 2022-05-30 MED ORDER — OXYCODONE HCL 5 MG PO TABS: 5.0000 mg | ORAL_TABLET | Freq: Four times a day (QID) | ORAL | 0 refills | Status: DC | PRN

## 2022-05-30 MED ORDER — ACETAMINOPHEN 500 MG PO TABS
1000.0000 mg | ORAL_TABLET | Freq: Once | ORAL | Status: AC
  Administered 2022-05-30: 1000 mg via ORAL
  Filled 2022-05-30: qty 2

## 2022-05-30 MED ORDER — HYDROMORPHONE HCL 1 MG/ML IJ SOLN
INTRAMUSCULAR | Status: DC | PRN
  Administered 2022-05-30 (×4): .5 mg via INTRAVENOUS

## 2022-05-30 MED ORDER — LACTATED RINGERS IV BOLUS
500.0000 mL | Freq: Once | INTRAVENOUS | Status: AC
  Administered 2022-05-30: 500 mL via INTRAVENOUS

## 2022-05-30 MED ORDER — ONDANSETRON HCL 4 MG PO TABS: 4.0000 mg | ORAL_TABLET | Freq: Four times a day (QID) | ORAL | Status: DC | PRN

## 2022-05-30 MED ORDER — ALBUTEROL SULFATE HFA 108 (90 BASE) MCG/ACT IN AERS
INHALATION_SPRAY | RESPIRATORY_TRACT | Status: DC | PRN
  Administered 2022-05-30: 2 via RESPIRATORY_TRACT

## 2022-05-30 MED ORDER — SUGAMMADEX SODIUM 200 MG/2ML IV SOLN
INTRAVENOUS | Status: DC | PRN
  Administered 2022-05-30: 200 mg via INTRAVENOUS

## 2022-05-30 MED ORDER — BUPIVACAINE LIPOSOME 1.3 % IJ SUSP
INTRAMUSCULAR | Status: AC
  Filled 2022-05-30: qty 20

## 2022-05-30 SURGICAL SUPPLY — 43 items
BAG COUNTER SPONGE SURGICOUNT (BAG) IMPLANT
BAG DECANTER FOR FLEXI CONT (MISCELLANEOUS) ×3 IMPLANT
BAG SPNG CNTER NS LX DISP (BAG)
BLADE SAW SGTL 18X1.27X75 (BLADE) ×3 IMPLANT
BOOTIES KNEE HIGH SLOAN (MISCELLANEOUS) ×3 IMPLANT
CELLS DAT CNTRL 66122 CELL SVR (MISCELLANEOUS) ×1 IMPLANT
COVER PERINEAL POST (MISCELLANEOUS) ×3 IMPLANT
COVER SURGICAL LIGHT HANDLE (MISCELLANEOUS) ×3 IMPLANT
DRAPE FOOT SWITCH (DRAPES) ×3 IMPLANT
DRAPE IMP U-DRAPE 54X76 (DRAPES) ×3 IMPLANT
DRAPE STERI IOBAN 125X83 (DRAPES) ×3 IMPLANT
DRAPE U-SHAPE 47X51 STRL (DRAPES) ×6 IMPLANT
DRSG AQUACEL AG ADV 3.5X 6 (GAUZE/BANDAGES/DRESSINGS) ×3 IMPLANT
DURAPREP 26ML APPLICATOR (WOUND CARE) ×3 IMPLANT
ELECT BLADE TIP CTD 4 INCH (ELECTRODE) ×3 IMPLANT
ELECT REM PT RETURN 15FT ADLT (MISCELLANEOUS) ×3 IMPLANT
ELIMINATOR HOLE APEX DEPUY (Hips) ×1 IMPLANT
GLOVE BIOGEL PI IND STRL 8 (GLOVE) ×4 IMPLANT
GLOVE BIOGEL PI INDICATOR 8 (GLOVE) ×2
GOWN STRL REUS W/ TWL XL LVL3 (GOWN DISPOSABLE) ×4 IMPLANT
GOWN STRL REUS W/TWL XL LVL3 (GOWN DISPOSABLE) ×4
HEAD FEMORAL 32 CERAMIC (Hips) ×1 IMPLANT
HOLDER FOLEY CATH W/STRAP (MISCELLANEOUS) ×3 IMPLANT
KIT TURNOVER KIT A (KITS) IMPLANT
LINER ACETABULAR 32X50 (Liner) ×1 IMPLANT
LINER PINN ACET GRIP 50X100 (Liner) ×1 IMPLANT
MANIFOLD NEPTUNE II (INSTRUMENTS) ×3 IMPLANT
NEEDLE HYPO 22GX1.5 SAFETY (NEEDLE) ×3 IMPLANT
NS IRRIG 1000ML POUR BTL (IV SOLUTION) ×3 IMPLANT
PACK ANTERIOR HIP CUSTOM (KITS) ×3 IMPLANT
PENCIL SMOKE EVACUATOR (MISCELLANEOUS) IMPLANT
PROTECTOR NERVE ULNAR (MISCELLANEOUS) ×3 IMPLANT
RETRACTOR WND ALEXIS 18 MED (MISCELLANEOUS) ×2 IMPLANT
RTRCTR WOUND ALEXIS 18CM MED (MISCELLANEOUS) ×2
SPIKE FLUID TRANSFER (MISCELLANEOUS) ×3 IMPLANT
STEM FEMORAL SZ5 HIGH ACTIS (Stem) ×1 IMPLANT
SUT ETHIBOND NAB CT1 #1 30IN (SUTURE) ×6 IMPLANT
SUT VIC AB 1 CT1 36 (SUTURE) ×3 IMPLANT
SUT VIC AB 2-0 CT1 27 (SUTURE) ×2
SUT VIC AB 2-0 CT1 TAPERPNT 27 (SUTURE) ×2 IMPLANT
SUT VICRYL AB 3-0 FS1 BRD 27IN (SUTURE) ×3 IMPLANT
SUT VLOC 180 0 24IN GS25 (SUTURE) ×3 IMPLANT
SYR 50ML LL SCALE MARK (SYRINGE) ×3 IMPLANT

## 2022-05-30 NOTE — Op Note (Signed)
PRE-OP DIAGNOSIS:  RIGHT HIP DEGENERATIVE JOINT DISEASE POST-OP DIAGNOSIS:  same PROCEDURE: RIGHT TOTAL HIP ARTHROPLASTY ANTERIOR APPROACH ANESTHESIA:  General SURGEON:  Melrose Nakayama MD ASSISTANT:  Loni Dolly PA-C   INDICATIONS FOR PROCEDURE:  The patient is a 56 y.o. female with a long history of a painful hip.  This has persisted despite multiple conservative measures.  The patient has persisted with pain and dysfunction making rest and activity difficult.  A total hip replacement is offered as surgical treatment.  Informed operative consent was obtained after discussion of possible complications including reaction to anesthesia, infection, neurovascular injury, dislocation, DVT, PE, and death.  The importance of the postoperative rehab program to optimize result was stressed with the patient.  SUMMARY OF FINDINGS AND PROCEDURE:  Under the above anesthesia through a anterior approach an the Hana table a right THR was performed.  The patient had severe degenerative change and good bone quality.  We used DePuy components to replace the hip and these were size 5 high offset Actis femur capped with a +1 25m ceramic hip ball.  On the acetabular side we used a size 50 Gription shell with a  plus 0 neutral polyethylene liner.  We did use a hole eliminator.  ALoni DollyPA-C assisted throughout and was invaluable to the completion of the case in that he helped position and retract while I performed the procedure.  He also closed simultaneously to help minimize OR time.  I used fluoroscopy throughout the case to check position of implants and leg lengths and read all of these views myself.  DESCRIPTION OF PROCEDURE:  The patient was taken to the OR suite where the above anesthetic was applied.  The patient was then positioned on the Hana table supine.  All bony prominences were appropriately padded.  Prep and drape was then performed in normal sterile fashion.  The patient was given vancomycin and kefzol  preoperative antibiotic and an appropriate time out was performed.  We then took an anterior approach to the right hip.  Dissection was taken through adipose to the tensor fascia lata fascia.  This structure was incised longitudinally and we dissected in the intermuscular interval just medial to this muscle.  Cobra retractors were placed superior and inferior to the femoral neck superficial to the capsule.  A capsular incision was then made and the retractors were placed along the femoral neck.  Xray was brought in to get a good level for the femoral neck cut which was made with an oscillating saw and osteotome.  The femoral head was removed with a corkscrew.  The acetabulum was exposed and some labral tissues were excised. Reaming was taken to the inside wall of the pelvis and sequentially up to 1 mm smaller than the actual component.  A trial of components was done and then the aforementioned acetabular shell was placed in appropriate tilt and anteversion confirmed by fluoroscopy. The liner was placed along with the hole eliminator and attention was turned to the femur.  The leg was brought down and over into adduction and the elevator bar was used to raise the femur up gently in the wound.  The piriformis was released with care taken to preserve the obturator internus attachment and all of the posterior capsule. The femur was reamed and then broached to the appropriate size.  A trial reduction was done and the aforementioned head and neck assembly gave u[place]oreathe best stability in extension with external rotation.  Leg lengths were felt to be  about equal by fluoroscopic exam.  The trial components were removed and the wound irrigated.  We then placed the femoral component in appropriate anteversion.  The head was applied to a dry stem neck and the hip again reduced.  It was again stable in the aforementioned position.  The would was irrigated again followed by re-approximation of anterior capsule with ethibond  suture. Tensor fascia was repaired with V-loc suture  followed by deep closure with #O and #2 undyed vicryl.  Skin was closed with subQ stitch and steristrips followed by a sterile dressing.  EBL and IOF can be obtained from anesthesia records.  DISPOSITION:  The patient was taken to PACU in stable condition to potentially go home same day depending on ability to walk and tolerate liquids.

## 2022-05-30 NOTE — Anesthesia Procedure Notes (Signed)
Procedure Name: Intubation Date/Time: 05/30/2022 7:43 AM  Performed by: Gwyndolyn Saxon, CRNAPre-anesthesia Checklist: Patient identified, Emergency Drugs available, Suction available and Patient being monitored Patient Re-evaluated:Patient Re-evaluated prior to induction Oxygen Delivery Method: Circle system utilized Preoxygenation: Pre-oxygenation with 100% oxygen Induction Type: IV induction Ventilation: Mask ventilation without difficulty Laryngoscope Size: Glidescope and 3 Grade View: Grade I Tube type: Oral Tube size: 7.0 mm Number of attempts: 2 Airway Equipment and Method: Rigid stylet Placement Confirmation: ETT inserted through vocal cords under direct vision, positive ETCO2 and breath sounds checked- equal and bilateral Secured at: 21 cm Tube secured with: Tape Dental Injury: Teeth and Oropharynx as per pre-operative assessment  Comments: DL #1 with Miller 2; grade 3 view and unable to easily pass ETT. DL #2 with glidescope

## 2022-05-30 NOTE — Transfer of Care (Signed)
Immediate Anesthesia Transfer of Care Note  Patient: Lori [name]  Procedure(s) Performed: RIGHT TOTAL HIP ARTHROPLASTY ANTERIOR APPROACH (Right: Hip)  Patient Location: PACU  Anesthesia Type:General  Level of Consciousness: drowsy and patient cooperative  Airway & Oxygen Therapy: Patient Spontanous Breathing and Patient connected to face mask oxygen  Post-op Assessment: Report given to RN and Post -op Vital signs reviewed and stable  Post vital signs: Reviewed and stable  Last Vitals:  Vitals Value Taken Time  BP 148/70 05/30/22 0916  Temp    Pulse 77 05/30/22 0918  Resp 14 05/30/22 0918  SpO2 100 % 05/30/22 0918  Vitals shown include unvalidated device data.  Last Pain:  Vitals:   05/30/22 0558  TempSrc: Oral  PainSc:          Complications: No notable events documented.

## 2022-05-30 NOTE — Evaluation (Signed)
Physical Therapy Evaluation Patient Details Name: Lori [name] MRN: [id number] DOB: Jul 09, 1966 Today's Date: 05/30/2022  History of Present Illness  56 YO female, S/P  R DA THA on 05/30/22. PMH: back surgeries, HTM, depression, IBS, and multiple other issues  Clinical Impression  The patient progressed to ambulation with RW and practiced steps. Provided HEP instruction. Spouse present. Patient has met PT goals for DC.        Recommendations for follow up therapy are one component of a multi-disciplinary discharge planning process, led by the attending physician.  Recommendations may be updated based on patient status, additional functional criteria and insurance authorization.  Follow Up Recommendations Follow physician's recommendations for discharge plan and follow up therapies    Assistance Recommended at Discharge Intermittent Supervision/Assistance  Patient can return home with the following  A little help with walking and/or transfers;Assistance with cooking/housework;Assist for transportation;Help with stairs or ramp for entrance;A little help with bathing/dressing/bathroom    Equipment Recommendations Rolling walker (2 wheels)  Recommendations for Other Services       Functional Status Assessment Patient has had a recent decline in their functional status and demonstrates the ability to make significant improvements in function in a reasonable and predictable amount of time.     Precautions / Restrictions Precautions Precautions: Fall Restrictions Weight Bearing Restrictions: No      Mobility  Bed Mobility Overal bed mobility: Needs Assistance Bed Mobility: Supine to Sit     Supine to sit: Min guard     General bed mobility comments: close guard    Transfers Overall transfer level: Needs assistance Equipment used: Rolling walker (2 wheels) Transfers: Sit to/from Stand Sit to Stand: Min assist, Min guard           General transfer comment: cues for  safety, from stretcher, toilet and recliner    Ambulation/Gait Ambulation/Gait assistance: Min guard Gait Distance (Feet): 60 Feet (then 40) Assistive device: Rolling walker (2 wheels) Gait Pattern/deviations: Step-to pattern, Step-through pattern, Antalgic       General Gait Details: cues for safety  Stairs Stairs: Yes Stairs assistance: Min guard Stair Management: One rail Right, Sideways Number of Stairs: 3 General stair comments: cues for sequence  Wheelchair Mobility    Modified Rankin (Stroke Patients Only)       Balance Overall balance assessment: Mild deficits observed, not formally tested                                           Pertinent Vitals/Pain Pain Assessment Pain Assessment: 0-10 Pain Score: 7  Pain Location: right hip,  right IV site Pain Descriptors / Indicators: Discomfort, Moaning Pain Intervention(s): Premedicated before session, Monitored during session    Home Living Family/patient expects to be discharged to:: Private residence Living Arrangements: Spouse/significant other Available Help at Discharge: Family;Available 24 hours/day Type of Home: House Home Access: Stairs to enter   Entrance Stairs-Number of Steps: 1 Alternate Level Stairs-Number of Steps: 7 Home Layout: Multi-level Home Equipment: None      Prior Function Prior Level of Function : Independent/Modified Independent                     Hand Dominance   Dominant Hand: Right    Extremity/Trunk Assessment   Upper Extremity Assessment Upper Extremity Assessment: Overall WFL for tasks assessed    Lower Extremity Assessment Lower Extremity Assessment:  RLE deficits/detail RLE Deficits / Details: able to advance th right leg    Cervical / Trunk Assessment Cervical / Trunk Assessment: Normal  Communication   Communication: No difficulties  Cognition Arousal/Alertness: Awake/alert Behavior During Therapy: WFL for tasks  assessed/performed Overall Cognitive Status: Within Functional Limits for tasks assessed                                          General Comments      Exercises Total Joint Exercises Ankle Circles/Pumps: AROM, Both Quad Sets: AROM, Both Hip ABduction/ADduction: AROM, Right Long Arc Quad: AROM, Right   Assessment/Plan    PT Assessment All further PT needs can be met in the next venue of care  PT Problem List Decreased strength;Decreased knowledge of precautions;Decreased mobility;Decreased range of motion;Decreased knowledge of use of DME;Decreased activity tolerance;Pain       PT Treatment Interventions      PT Goals (Current goals can be found in the Care Plan section)  Acute Rehab PT Goals Patient Stated Goal: go home PT Goal Formulation: All assessment and education complete, DC therapy    Frequency       Co-evaluation               AM-PAC PT "6 Clicks" Mobility  Outcome Measure Help needed turning from your back to your side while in a flat bed without using bedrails?: A Little Help needed moving from lying on your back to sitting on the side of a flat bed without using bedrails?: A Little Help needed moving to and from a bed to a chair (including a wheelchair)?: A Little Help needed standing up from a chair using your arms (e.g., wheelchair or bedside chair)?: A Little Help needed to walk in hospital room?: A Little Help needed climbing 3-5 steps with a railing? : A Little 6 Click Score: 18    End of Session Equipment Utilized During Treatment: Gait belt Activity Tolerance: Patient tolerated treatment well Patient left: in bed;with nursing/sitter in room;with family/visitor present Nurse Communication: Mobility status PT Visit Diagnosis: Unsteadiness on feet (R26.81);Pain Pain - Right/Left: Right Pain - part of body: Hip    Time: 1340-1410 PT Time Calculation (min) (ACUTE ONLY): 30 min   Charges:   PT Evaluation $PT Eval Low  Complexity: 1 Low PT Treatments $Gait Training: 8-22 mins        [place] Office [phone] Weekend [phone]   Claretha Cooper 05/30/2022, 2:26 PM

## 2022-05-30 NOTE — Interval H&P Note (Signed)
History and Physical Interval Note:  05/30/2022 7:28 AM  Lori [name]  has presented today for surgery, with the diagnosis of RIGHT HIP DEGENERATIVE JOINT DISEASE.  The various methods of treatment have been discussed with the patient and family. After consideration of risks, benefits and other options for treatment, the patient has consented to  Procedure(s): RIGHT TOTAL HIP ARTHROPLASTY ANTERIOR APPROACH (Right) as a surgical intervention.  The patient's history has been reviewed, patient examined, no change in status, stable for surgery.  I have reviewed the patient's chart and labs.  Questions were answered to the patient's satisfaction.     Hessie Dibble

## 2022-05-30 NOTE — Anesthesia Postprocedure Evaluation (Signed)
Anesthesia Post Note  Patient: Lori [name]  Procedure(s) Performed: RIGHT TOTAL HIP ARTHROPLASTY ANTERIOR APPROACH (Right: Hip)     Patient location during evaluation: PACU Anesthesia Type: General Level of consciousness: awake and alert Pain management: pain level controlled Vital Signs Assessment: post-procedure vital signs reviewed and stable Respiratory status: spontaneous breathing, nonlabored ventilation and respiratory function stable Cardiovascular status: blood pressure returned to baseline and stable Postop Assessment: no apparent nausea or vomiting Anesthetic complications: no   No notable events documented.  Last Vitals:  Vitals:   05/30/22 1205 05/30/22 1225  BP: 122/60   Pulse: 80 86  Resp: 12   Temp:    SpO2: 95% 96%    Last Pain:  Vitals:   05/30/22 1225  TempSrc:   PainSc: 5                  Jule Schlabach,W. EDMOND

## 2022-05-31 ENCOUNTER — Encounter (HOSPITAL_COMMUNITY): Payer: Self-pay | Admitting: Orthopaedic Surgery

## 2022-07-11 ENCOUNTER — Ambulatory Visit: Payer: 59 | Admitting: Family Medicine

## 2022-07-14 ENCOUNTER — Other Ambulatory Visit: Payer: Self-pay | Admitting: *Deleted

## 2022-07-14 DIAGNOSIS — I10 Essential (primary) hypertension: Secondary | ICD-10-CM

## 2022-07-14 MED ORDER — HYDROCHLOROTHIAZIDE 25 MG PO TABS: 25.0000 mg | ORAL_TABLET | Freq: Every day | ORAL | 0 refills | Status: DC

## 2022-07-19 ENCOUNTER — Encounter (INDEPENDENT_AMBULATORY_CARE_PROVIDER_SITE_OTHER): Payer: Self-pay

## 2022-07-24 ENCOUNTER — Other Ambulatory Visit: Payer: Self-pay | Admitting: *Deleted

## 2022-07-24 MED ORDER — ROSUVASTATIN CALCIUM 20 MG PO TABS: 20.0000 mg | ORAL_TABLET | Freq: Every day | ORAL | 0 refills | Status: DC

## 2022-07-27 ENCOUNTER — Telehealth: Payer: Self-pay | Admitting: *Deleted

## 2022-07-27 MED ORDER — TIRZEPATIDE 2.5 MG/0.5ML ~~LOC~~ SOAJ: 2.5000 mg | SUBCUTANEOUS | 1 refills | Status: DC

## 2022-07-27 NOTE — Telephone Encounter (Signed)
Ok to refill [employer]

## 2022-07-27 NOTE — Telephone Encounter (Signed)
Rx done. 

## 2022-07-27 NOTE — Telephone Encounter (Signed)
Fax received for a refill request on Mounjaro 2.5/0.69m pen-229minject 2.'5mg'$  SQ weekly.  Message sent to Dr MILegrand Como

## 2022-08-15 ENCOUNTER — Encounter: Payer: Self-pay | Admitting: Family Medicine

## 2022-08-15 ENCOUNTER — Ambulatory Visit (INDEPENDENT_AMBULATORY_CARE_PROVIDER_SITE_OTHER): Payer: 59 | Admitting: Family Medicine

## 2022-08-15 VITALS — BP 104/60 | HR 78 | Temp 98.3°F | Ht 64.0 in | Wt 213.0 lb

## 2022-08-15 DIAGNOSIS — E1165 Type 2 diabetes mellitus with hyperglycemia: Secondary | ICD-10-CM

## 2022-08-15 DIAGNOSIS — I10 Essential (primary) hypertension: Secondary | ICD-10-CM | POA: Diagnosis not present

## 2022-08-15 LAB — POCT GLYCOSYLATED HEMOGLOBIN (HGB A1C): Hemoglobin A1C: 5.8 % — AB (ref 4.0–5.6)

## 2022-08-15 MED ORDER — TIRZEPATIDE 2.5 MG/0.5ML ~~LOC~~ SOAJ: 2.5000 mg | SUBCUTANEOUS | 5 refills | Status: DC
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
## 2022-08-15 NOTE — Patient Instructions (Signed)
Check blood pressure daily, goal BP is anything less than 140/90  Try soaking you feet in warm water for 20 minutes every night then "flossing" the toenails to help reduce ingrown toenails.

## 2022-08-16 NOTE — Progress Notes (Signed)
Lori [name] [address] [place] Phone: [phone] Subjective:   IVilma [name], am serving as a scribe for Dr. Hulan Saas.  I'm seeing this patient by the request  of:  Farrel Conners, MD  CC: Back and neck pain follow-up  TOI:[id number]  Lori [name] is a 56 y.o. female coming in with complaint of back and neck pain. OMT 05/02/2022. R THR on 05/30/2022. Patient states doing well. Has lost a little weight and PCP has taken her off  her BP and cholesterol medication. No new issues.  Medications patient has been prescribed: None  Taking:         Reviewed prior external information including notes and imaging from previsou exam, outside providers and external EMR if available.   As well as notes that were available from care everywhere and other healthcare systems.  Past medical history, social, surgical and family history all reviewed in electronic medical record.  No pertanent information unless stated regarding to the chief complaint.   Past Medical History:  Diagnosis Date   ALLERGIC RHINITIS 03/14/2010   BACK PAIN 01/24/2010   Buttock pain    Constipation    Depression    Diabetes ([place])    Fatty liver    GERD 12/01/2008   HBP (high blood pressure)    HIATAL HERNIA 05/27/2010   Hot flashes    Hyperlipidemia    Hypertrophy of tongue papillae 03/14/2010   IBS (irritable bowel syndrome)    Joint pain    Numbness and tingling of left upper and lower extremity    OSTEOARTHRITIS 12/01/2008   Osteoporosis 05/17/2020   Fragility fracture plus T score -1.8 on DEXA scan June 2021   Pneumonia    Sciatica, left side    Sleep apnea    Sleep apnea    Thigh pain    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 cakes)   Latex Rash   Morphine Anxiety and Other (See Comments)    REACTION: hyper REACTION: hyper   Torecan [Thiethylperazine] Palpitations and Other (See Comments)     Review of Systems:  No headache, visual changes, nausea, vomiting, diarrhea, constipation, dizziness, abdominal pain, skin rash, fevers, chills, night sweats, weight loss, swollen lymph nodes, body aches, joint swelling, chest pain, shortness of breath, mood changes. POSITIVE muscle aches  Objective  Blood pressure 130/74, pulse 85, height '5\' 4"'$  (1.626 m), weight 212 lb (96.2 kg), last menstrual period 09/27/2016, SpO2 96 %.   General: No apparent distress alert and oriented x3 mood and affect normal, dressed appropriately.  HEENT: Pupils equal, extraocular movements intact  Respiratory: Patient's speak in full sentences and does not appear short of breath  Cardiovascular: No lower extremity edema, non tender, no erythema  Gait antalgic  MSK:  Back patient has lost some weight.  Does have still tightness noted in the paraspinal musculature.  Patient does have some lack of lordosis.  Difficulty with FABER test bilaterally.  Osteopathic findings  C6 flexed rotated and side bent left T3 extended rotated and side bent right inhaled rib T9 extended rotated and side bent left L2 flexed rotated and side bent  right 5 flexed rotated and side bent left Sacrum right on right       Assessment and Plan:  Degenerative disc disease, cervical Postsurgical changes with fairly large effusion noted still.  Avoided HVLA but did respond extremely well to osteopathic manipulation.  Discussed which activities to do and which ones to avoid.  Do believe that with patient increasing activity now should do better.  Follow-up with me again in 6 to 8 weeks.    Nonallopathic problems  Decision today to treat with OMT was based on Physical Exam  After  verbal consent patient was treated with  ME, FPR techniques in cervical, rib, thoracic, lumbar, and sacral  areas  Patient tolerated the procedure well with improvement in symptoms  Patient given exercises, stretches and lifestyle modifications  See medications in patient instructions if given  Patient will follow up in 4-8 weeks     The above documentation has been reviewed and is accurate and complete Lyndal Pulley, DO         Note: This dictation was prepared with Dragon dictation along with smaller phrase technology. Any transcriptional errors that result from this process are unintentional.

## 2022-08-17 ENCOUNTER — Ambulatory Visit (INDEPENDENT_AMBULATORY_CARE_PROVIDER_SITE_OTHER): Payer: 59 | Admitting: Family Medicine

## 2022-08-17 VITALS — BP 130/74 | HR 85 | Ht 64.0 in | Wt 212.0 lb

## 2022-08-17 DIAGNOSIS — M9904 Segmental and somatic dysfunction of sacral region: Secondary | ICD-10-CM | POA: Diagnosis not present

## 2022-08-17 DIAGNOSIS — M9902 Segmental and somatic dysfunction of thoracic region: Secondary | ICD-10-CM | POA: Diagnosis not present

## 2022-08-17 DIAGNOSIS — M9903 Segmental and somatic dysfunction of lumbar region: Secondary | ICD-10-CM

## 2022-08-17 DIAGNOSIS — M9908 Segmental and somatic dysfunction of rib cage: Secondary | ICD-10-CM

## 2022-08-17 DIAGNOSIS — M9901 Segmental and somatic dysfunction of cervical region: Secondary | ICD-10-CM | POA: Diagnosis not present

## 2022-08-17 DIAGNOSIS — M5030 Other cervical disc degeneration, unspecified cervical region: Secondary | ICD-10-CM | POA: Diagnosis not present

## 2022-08-17 NOTE — Assessment & Plan Note (Signed)
A1C is well controlled on the 2.5 mg weekly mounjaro. She has lost approximately 8 pounds since June 2023, this is likely also helping with her blood pressure. I have refilled her medication and encouraged her to continue increasing physical activity and limit sugar and starches in her diet. RTC in 6 months for follow up,

## 2022-08-17 NOTE — Assessment & Plan Note (Signed)
Postsurgical changes with fairly large effusion noted still.  Avoided HVLA but did respond extremely well to osteopathic manipulation.  Discussed which activities to do and which ones to avoid.  Do believe that with patient increasing activity now should do better.  Follow-up with me again in 6 to 8 weeks.

## 2022-08-17 NOTE — Assessment & Plan Note (Signed)
Current hypertension medications:    No hypertension medications on med list     Patient stopped her losartan and HCTZ due to feeling tired and having other symptoms of low blood pressure. BP today in office is <140, I advised that she continue to monitor her blood pressure at home and if she is getting elevated readings she should notify us so we can restart her medication.

## 2022-08-17 NOTE — Patient Instructions (Signed)
Good to see you   

## 2022-08-23 ENCOUNTER — Telehealth: Payer: Self-pay | Admitting: *Deleted

## 2022-08-23 NOTE — Telephone Encounter (Signed)
[facility] faxed a refill request for Losartan '50mg'$ .  Message sent to PCP as the Rx is not on the medication list.

## 2022-08-24 NOTE — Telephone Encounter (Signed)
Patient stopped the medication on her own so I discontinued it-- she reported feeling draggy, BP in offce last week was good without it.

## 2022-08-25 NOTE — Telephone Encounter (Signed)
Spoke with Caryl Pina at [facility] and informed her the Rx was discontinued as below.

## 2022-09-07 ENCOUNTER — Inpatient Hospital Stay (HOSPITAL_BASED_OUTPATIENT_CLINIC_OR_DEPARTMENT_OTHER): Payer: 59 | Admitting: Oncology

## 2022-09-07 ENCOUNTER — Telehealth: Payer: Self-pay | Admitting: *Deleted

## 2022-09-07 ENCOUNTER — Inpatient Hospital Stay: Payer: 59 | Attending: Oncology

## 2022-09-07 VITALS — BP 140/87 | HR 81 | Temp 98.1°F | Resp 18 | Ht 64.0 in | Wt 210.0 lb

## 2022-09-07 DIAGNOSIS — F17210 Nicotine dependence, cigarettes, uncomplicated: Secondary | ICD-10-CM | POA: Diagnosis not present

## 2022-09-07 DIAGNOSIS — E119 Type 2 diabetes mellitus without complications: Secondary | ICD-10-CM | POA: Diagnosis not present

## 2022-09-07 DIAGNOSIS — D72829 Elevated white blood cell count, unspecified: Secondary | ICD-10-CM

## 2022-09-07 LAB — CBC WITH DIFFERENTIAL (CANCER CENTER ONLY)
Abs Immature Granulocytes: 0.02 10*3/uL (ref 0.00–0.07)
Basophils Absolute: 0.1 10*3/uL (ref 0.0–0.1)
Basophils Relative: 1 %
Eosinophils Absolute: 0.2 10*3/uL (ref 0.0–0.5)
Eosinophils Relative: 2 %
HCT: 45.5 % (ref 36.0–46.0)
Hemoglobin: 15.4 g/dL — ABNORMAL HIGH (ref 12.0–15.0)
Immature Granulocytes: 0 %
Lymphocytes Relative: 39 %
Lymphs Abs: 4.2 10*3/uL — ABNORMAL HIGH (ref 0.7–4.0)
MCH: 30.4 pg (ref 26.0–34.0)
MCHC: 33.8 g/dL (ref 30.0–36.0)
MCV: 89.7 fL (ref 80.0–100.0)
Monocytes Absolute: 0.5 10*3/uL (ref 0.1–1.0)
Monocytes Relative: 5 %
Neutro Abs: 5.7 10*3/uL (ref 1.7–7.7)
Neutrophils Relative %: 53 %
Platelet Count: 223 10*3/uL (ref 150–400)
RBC: 5.07 MIL/uL (ref 3.87–5.11)
RDW: 14.6 % (ref 11.5–15.5)
WBC Count: 10.7 10*3/uL — ABNORMAL HIGH (ref 4.0–10.5)
nRBC: 0 % (ref 0.0–0.2)

## 2022-09-07 NOTE — Telephone Encounter (Signed)
[facility]ed a refill request for Fluoxetine '40mg'$ -take 1 daily and the Rx is not on the current med list.  Message sent to PCP.

## 2022-09-07 NOTE — Progress Notes (Signed)
  [place] OFFICE PROGRESS NOTE   Diagnosis: Leukocytosis  INTERVAL HISTORY:   Lori [name] returns as scheduled.  She feels well.  She underwent hip replacement surgery in June.  She relates weight loss to starting [facility] for diabetes.  She continues smoking approximately 1 pack of cigarettes per day. Peripheral blood flow cytometry 05/22/2022 revealed a predominance of T lymphocytes and no monoclonal B-cell population. She has noted a nodule at the left parietal scalp that waxes and wanes in size. Objective:  Vital signs in last 24 hours:  Blood pressure (!) 140/87, pulse 81, temperature 98.1 F (36.7 C), temperature source Oral, resp. rate 18, height '5\' 4"'$  (1.626 m), weight 210 lb (95.3 kg), last menstrual period 09/27/2016, SpO2 100 %.    HEENT: Neck without mass Lymphatics: No cervical, supraclavicular, axillary, or inguinal nodes Resp: Lungs clear bilaterally Cardio: Regular rate and rhythm GI: No hepatosplenomegaly Vascular: No leg edema  Skin: 3-4 mm subcutaneous nodular occipital scalp, no overlying erythema or discoloration.  Nontender.   Lab Results:  Lab Results  Component Value Date   WBC 10.7 (H) 09/07/2022   HGB 15.4 (H) 09/07/2022   HCT 45.5 09/07/2022   MCV 89.7 09/07/2022   PLT 223 09/07/2022   NEUTROABS 5.7 09/07/2022    CMP  Lab Results  Component Value Date   NA 141 05/17/2022   K 4.4 05/17/2022   CL 107 05/17/2022   CO2 25 05/17/2022   GLUCOSE 107 (H) 05/17/2022   BUN 15 05/17/2022   CREATININE 0.73 05/17/2022   CALCIUM 9.5 05/17/2022   PROT 7.3 04/11/2022   ALBUMIN 4.5 04/11/2022   AST 35 04/11/2022   ALT 39 (H) 04/11/2022   ALKPHOS 67 04/11/2022   BILITOT 0.4 04/11/2022   GFRNONAA >60 05/17/2022   GFRAA 113 11/22/2020     Medications: I have reviewed the patient's current medications.   Assessment/Plan:  Lymphocytosis-chronic and mild Peripheral blood flow cytometry 05/22/2022-no monoclonal B-cell population,  predominance of T cells with nonspecific changes  Ongoing tobacco use Chronic back pain Diabetes Hyperlipidemia Sleep apnea Hypertension    Disposition: Lori [name] has chronic mild lymphocytosis.  Lymphocytosis is most likely elated to tobacco use.  No clonal population was identified on peripheral blood flow cytometry in June.  The hemoglobin has been mildly elevated intermittently.  This is likely secondary to smoking.  I recommended she discontinue smoking.  The tiny scalp lesion is likely a benign finding such as an inclusion cyst.  She will seek medical attention if this area changes.  She will return for an office and lab visit in 8-9 months.  Betsy Coder, MD  09/07/2022  12:31 PM

## 2022-09-08 NOTE — Telephone Encounter (Signed)
It was stopped in March 2023 but she had 90 plus a refill at that time- it's ok to refill the medication as long as pt reports that she is still taking it daily.

## 2022-09-08 NOTE — Telephone Encounter (Signed)
Rx denial sent via escribe. 

## 2022-09-27 ENCOUNTER — Encounter: Payer: Self-pay | Admitting: *Deleted

## 2022-10-17 NOTE — Progress Notes (Unsigned)
Lori Lori [name] Phone: [phone] Subjective:   Lori Lori [name], am serving as a scribe for Dr. Hulan Saas.  I'm seeing this patient by the request  of:  Lori Conners, MD  CC: back and neck pain follow up   Lori [name]:[id number]  Lori Lori [name] is a 56 y.o. female coming in with complaint of back and neck pain. OMT 09/16/2022. Patient states that just below scapula her back is tight.   R knee pain is still grinding when she goes up the stairs. Numbness in quad over incision from THR. Pain can shoot down to the foot.   Medications patient has been prescribed: None          Reviewed prior external information including notes and imaging from previsou exam, outside providers and external EMR if available.   As well as notes that were available from care everywhere and other healthcare systems.  Past medical history, social, surgical and family history all reviewed in electronic medical record.  Lori [name] pertanent information unless stated regarding to the chief complaint.   Past Medical History:  Diagnosis Date   ALLERGIC RHINITIS 03/14/2010   BACK PAIN 01/24/2010   Buttock pain    Constipation    Depression    Diabetes ([place])    Fatty liver    GERD 12/01/2008   HBP (high blood pressure)    HIATAL HERNIA 05/27/2010   Hot flashes    Hyperlipidemia    Hypertrophy of tongue papillae 03/14/2010   IBS (irritable bowel syndrome)    Joint pain    Numbness and tingling of left upper and lower extremity    OSTEOARTHRITIS 12/01/2008   Osteoporosis 05/17/2020   Fragility fracture plus T score -1.8 on DEXA scan June 2021   Pneumonia    Sciatica, left side    Sleep apnea    Sleep apnea    Thigh pain    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 cakes)   Latex Rash   Morphine Anxiety and Other (See Comments)    REACTION: hyper REACTION: hyper   Torecan [Thiethylperazine] Palpitations and Other (See Comments)     Review of Systems:  Lori [name] headache, visual changes, nausea, vomiting, diarrhea, constipation, dizziness, abdominal pain, skin rash, fevers, chills, night sweats, weight loss, swollen lymph nodes, body aches, joint swelling, chest pain, shortness of breath, mood changes. POSITIVE muscle aches  Objective  Blood pressure 110/66, pulse 90, height '5\' 4"'$  (1.626 m), weight 212 lb (96.2 kg), last menstrual period 09/27/2016, SpO2 98 %.   General: Lori [name] apparent distress alert and oriented x3 mood and affect normal, dressed appropriately.  HEENT: Pupils equal, extraocular movements intact  Respiratory: Patient's speak in full sentences and does not appear short of breath  Cardiovascular: Lori [name] lower extremity edema, non tender, Lori [name] erythema  MSK:  Back low back does have some loss of lordosis tightness noted throughout.  Patient's neck exam does have some limited sidebending bilaterally.  Lacks 15 degrees of extension. Mild antalgic gait with the hip replacement.  Osteopathic findings  C3 flexed rotated and side bent right C5 flexed rotated and side bent left T3  extended rotated and side bent right inhaled rib T9 extended rotated and side bent left L2 flexed rotated and side bent right L5 F RS right  Sacrum right on right       Assessment and Plan:  Degenerative disc disease, cervical Degenerative disc disease disease noted.  We discussed again we will try more over the later muscle energy on the neck.  Responded extremely well in the lower back to more of the osteopathic manipulation.  Has the right hip replacement and has been attempting to lose weight which I think will be highly helpful.  Continue to be active and  follow-up again in 6 to 8 weeks    Nonallopathic problems  Decision today to treat with OMT was based on Physical Exam  After verbal consent patient was treated with  ME, FPR techniques in cervical, rib, thoracic, lumbar, and sacral  areas  Patient tolerated the procedure well with improvement in symptoms  Patient given exercises, stretches and lifestyle modifications  See medications in patient instructions if given  Patient will follow up in 4-8 weeks     The above documentation has been reviewed and is accurate and complete Lori Pulley, DO         Note: This dictation was prepared with Dragon dictation along with smaller phrase technology. Any transcriptional errors that result from this process are unintentional.

## 2022-10-19 ENCOUNTER — Encounter: Payer: Self-pay | Admitting: Family Medicine

## 2022-10-19 ENCOUNTER — Ambulatory Visit (INDEPENDENT_AMBULATORY_CARE_PROVIDER_SITE_OTHER): Payer: 59 | Admitting: Family Medicine

## 2022-10-19 VITALS — BP 110/66 | HR 90 | Ht 64.0 in | Wt 212.0 lb

## 2022-10-19 DIAGNOSIS — M9902 Segmental and somatic dysfunction of thoracic region: Secondary | ICD-10-CM

## 2022-10-19 DIAGNOSIS — M9903 Segmental and somatic dysfunction of lumbar region: Secondary | ICD-10-CM | POA: Diagnosis not present

## 2022-10-19 DIAGNOSIS — M9908 Segmental and somatic dysfunction of rib cage: Secondary | ICD-10-CM

## 2022-10-19 DIAGNOSIS — M5030 Other cervical disc degeneration, unspecified cervical region: Secondary | ICD-10-CM

## 2022-10-19 DIAGNOSIS — M9904 Segmental and somatic dysfunction of sacral region: Secondary | ICD-10-CM | POA: Diagnosis not present

## 2022-10-19 DIAGNOSIS — M9901 Segmental and somatic dysfunction of cervical region: Secondary | ICD-10-CM | POA: Diagnosis not present

## 2022-10-19 NOTE — Patient Instructions (Signed)
Good to see you See me again in 7-8 weeks

## 2022-10-19 NOTE — Assessment & Plan Note (Signed)
Degenerative disc disease disease noted.  We discussed again we will try more over the later muscle energy on the neck.  Responded extremely well in the lower back to more of the osteopathic manipulation.  Has the right hip replacement and has been attempting to lose weight which I think will be highly helpful.  Continue to be active and follow-up again in 6 to 8 weeks

## 2022-11-30 NOTE — Progress Notes (Deleted)
[place] Phone: [phone] Subjective:    I'm seeing this patient by the request  of:  Farrel Conners, MD  CC:   [id number]  Lori [name] is a 56 y.o. female coming in with complaint of back and neck pain. OMT 10/19/2022. Patient states   Medications patient has been prescribed:   Taking:         Reviewed prior external information including notes and imaging from previsou exam, outside providers and external EMR if available.   As well as notes that were available from care everywhere and other healthcare systems.  Past medical history, social, surgical and family history all reviewed in electronic medical record.  No pertanent information unless stated regarding to the chief complaint.   Past Medical History:  Diagnosis Date   ALLERGIC RHINITIS 03/14/2010   BACK PAIN 01/24/2010   Buttock pain    Constipation    Depression    Diabetes ([place])    Fatty liver    GERD 12/01/2008   HBP (high blood pressure)    HIATAL HERNIA 05/27/2010   Hot flashes    Hyperlipidemia    Hypertrophy of tongue papillae 03/14/2010   IBS (irritable bowel syndrome)    Joint pain    Numbness and tingling of left upper and lower extremity    OSTEOARTHRITIS 12/01/2008   Osteoporosis 05/17/2020   Fragility fracture plus T score -1.8 on DEXA scan June 2021   Pneumonia    Sciatica, left side    Sleep apnea    Sleep apnea    Thigh pain    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   TOBACCO USE 12/01/2008   VITAMIN D DEFICIENCY 05/23/2010    Allergies  Allergen Reactions   Amoxicillin Nausea And Vomiting    Yeast Infection, severe nausea and vomiting    Eggs Or Egg-Derived Products Nausea And Vomiting    With scrambled or cooked eggs (can eat if baked into something i.e. cakes)   Latex Rash   Morphine Anxiety and Other (See Comments)    REACTION: hyper REACTION: hyper   Torecan [Thiethylperazine] Palpitations and  Other (See Comments)     Review of Systems:  No headache, visual changes, nausea, vomiting, diarrhea, constipation, dizziness, abdominal pain, skin rash, fevers, chills, night sweats, weight loss, swollen lymph nodes, body aches, joint swelling, chest pain, shortness of breath, mood changes. POSITIVE muscle aches  Objective  Last menstrual period 09/27/2016.   General: No apparent distress alert and oriented x3 mood and affect normal, dressed appropriately.  HEENT: Pupils equal, extraocular movements intact  Respiratory: Patient's speak in full sentences and does not appear short of breath  Cardiovascular: No lower extremity edema, non tender, no erythema  Gait MSK:  Back   Osteopathic findings  C2 flexed rotated and side bent right C6 flexed rotated and side bent left T3 extended rotated and side bent right inhaled rib T9 extended rotated and side bent left L2 flexed rotated and side bent right Sacrum right on right       Assessment and Plan:  No problem-specific Assessment & Plan notes found for this encounter.    Nonallopathic problems  Decision today to treat with OMT was based on Physical Exam  After verbal consent patient was treated with HVLA, ME, FPR techniques in cervical, rib, thoracic, lumbar, and sacral  areas  Patient tolerated the procedure well with improvement in symptoms  Patient given exercises, stretches and lifestyle modifications  See medications in patient instructions if given  Patient will follow up in 4-8 weeks             Note: This dictation was prepared with Dragon dictation along with smaller phrase technology. Any transcriptional errors that result from this process are unintentional.

## 2022-12-07 ENCOUNTER — Ambulatory Visit: Payer: 59 | Admitting: Family Medicine

## 2022-12-07 NOTE — Progress Notes (Deleted)
[place] Phone: [phone] Subjective:    I'm seeing this patient by the request  of:  Farrel Conners, MD  CC:   [id number]  Lori [name] is a 56 y.o. female coming in with complaint of back and neck pain Patient states   Medications patient has been prescribed:   Taking:         Reviewed prior external information including notes and imaging from previsou exam, outside providers and external EMR if available.   As well as notes that were available from care everywhere and other healthcare systems.  Past medical history, social, surgical and family history all reviewed in electronic medical record.  No pertanent information unless stated regarding to the chief complaint.   Past Medical History:  Diagnosis Date   ALLERGIC RHINITIS 03/14/2010   BACK PAIN 01/24/2010   Buttock pain    Constipation    Depression    Diabetes ([place])    Fatty liver    GERD 12/01/2008   HBP (high blood pressure)    HIATAL HERNIA 05/27/2010   Hot flashes    Hyperlipidemia    Hypertrophy of tongue papillae 03/14/2010   IBS (irritable bowel syndrome)    Joint pain    Numbness and tingling of left upper and lower extremity    OSTEOARTHRITIS 12/01/2008   Osteoporosis 05/17/2020   Fragility fracture plus T score -1.8 on DEXA scan June 2021   Pneumonia    Sciatica, left side    Sleep apnea    Sleep apnea    Thigh pain    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   TOBACCO USE 12/01/2008   VITAMIN D DEFICIENCY 05/23/2010    Allergies  Allergen Reactions   Amoxicillin Nausea And Vomiting    Yeast Infection, severe nausea and vomiting    Eggs Or Egg-Derived Products Nausea And Vomiting    With scrambled or cooked eggs (can eat if baked into something i.e. cakes)   Latex Rash   Morphine Anxiety and Other (See Comments)    REACTION: hyper REACTION: hyper   Torecan [Thiethylperazine] Palpitations and Other (See Comments)      Review of Systems:  No headache, visual changes, nausea, vomiting, diarrhea, constipation, dizziness, abdominal pain, skin rash, fevers, chills, night sweats, weight loss, swollen lymph nodes, body aches, joint swelling, chest pain, shortness of breath, mood changes. POSITIVE muscle aches  Objective  Last menstrual period 09/27/2016.   General: No apparent distress alert and oriented x3 mood and affect normal, dressed appropriately.  HEENT: Pupils equal, extraocular movements intact  Respiratory: Patient's speak in full sentences and does not appear short of breath  Cardiovascular: No lower extremity edema, non tender, no erythema  Gait MSK:  Back   Osteopathic findings  C3 flexed rotated and side bent right C7 flexed rotated and side bent left T3 extended rotated and side bent right inhaled rib T8 extended rotated and side bent left L1 flexed rotated and side bent right Sacrum right on right     Assessment and Plan:  No problem-specific Assessment & Plan notes found for this encounter.    Nonallopathic problems  Decision today to treat with OMT was based on Physical Exam  After verbal consent patient was treated with HVLA, ME, FPR techniques in cervical, rib, thoracic, lumbar, and sacral  areas  Patient tolerated the procedure well with improvement in symptoms  Patient given exercises, stretches and lifestyle modifications  See medications in patient  instructions if given  Patient will follow up in 4-8 weeks     The above documentation has been reviewed and is accurate and complete Lyndal Pulley, DO         Note: This dictation was prepared with Dragon dictation along with smaller phrase technology. Any transcriptional errors that result from this process are unintentional.

## 2023-02-13 ENCOUNTER — Ambulatory Visit: Payer: 59 | Admitting: Family Medicine

## 2023-02-14 ENCOUNTER — Ambulatory Visit: Payer: 59 | Admitting: Family Medicine

## 2023-02-14 VITALS — BP 116/78 | HR 86 | Temp 98.4°F | Ht 64.0 in | Wt 209.0 lb

## 2023-02-14 DIAGNOSIS — E1165 Type 2 diabetes mellitus with hyperglycemia: Secondary | ICD-10-CM

## 2023-02-14 DIAGNOSIS — I10 Essential (primary) hypertension: Secondary | ICD-10-CM | POA: Diagnosis not present

## 2023-02-14 DIAGNOSIS — F17200 Nicotine dependence, unspecified, uncomplicated: Secondary | ICD-10-CM | POA: Diagnosis not present

## 2023-02-14 DIAGNOSIS — Z1231 Encounter for screening mammogram for malignant neoplasm of breast: Secondary | ICD-10-CM

## 2023-02-14 DIAGNOSIS — M5136 Other intervertebral disc degeneration, lumbar region: Secondary | ICD-10-CM

## 2023-02-14 LAB — POCT GLYCOSYLATED HEMOGLOBIN (HGB A1C): Hemoglobin A1C: 5.5 % (ref 4.0–5.6)

## 2023-02-14 MED ORDER — HYDROCODONE-ACETAMINOPHEN 10-325 MG PO TABS: 1.0000 | ORAL_TABLET | Freq: Four times a day (QID) | ORAL | 0 refills | Status: DC | PRN

## 2023-02-14 MED ORDER — TIRZEPATIDE 2.5 MG/0.5ML ~~LOC~~ SOAJ: 2.5000 mg | SUBCUTANEOUS | 11 refills | Status: DC

## 2023-02-14 MED ORDER — CYCLOBENZAPRINE HCL 10 MG PO TABS: 10.0000 mg | ORAL_TABLET | Freq: Every day | ORAL | 2 refills | Status: DC

## 2023-02-14 NOTE — Assessment & Plan Note (Signed)
A1C performed in office today and is 5.5, she is doing well with the 2.5 mg weekly mounjaro, will continue this, refills sent to pharmacy.

## 2023-02-14 NOTE — Assessment & Plan Note (Signed)
BP WNL today, not on medication for this but she is losing weight which is likely helping reduce BP. Will continue to monitor BP at every visit.

## 2023-02-14 NOTE — Patient Instructions (Addendum)
1200 mg of calcium per day  1000 unit vitamin D daily  800 mg ibuprofen for additional pain

## 2023-02-14 NOTE — Assessment & Plan Note (Signed)
Is being followed by pain management, on norco 10/325 mg 4 times daily written by them. She continues to complain of pain across her hips in the lower back, I recommended adding NSAIDS like ibuprofen 800 mg every 8 hours and will add cyclobenzaprine 10 mg at bedtime to help reduce muscle stiffness/spasms.

## 2023-02-14 NOTE — Progress Notes (Signed)
Established Patient Office Visit  Subjective   Patient ID: Lori [name], female    DOB: 1966-07-18  Age: 57 y.o. MRN: [id number]  Chief Complaint  Patient presents with   Medical Management of Chronic Issues    Pt is here for 6 month follow up.  DM-- continues on mounjaro 2.5 mg weekly. A1C performed in office today and is 5.5. pt reports she is doing well with the medication, reports she is still losing weight slowly, denies any side effects from the medication.   HTN-- history of, BP is WNL today without medication.   Tobacco dependence-- pt is due for her LDCT screening, she reports she continues to smoke about 1 ppd.    Current Outpatient Medications  Medication Instructions   Brimonidine Tartrate (LUMIFY) 0.025 % SOLN 1 drop, Both Eyes, 2 times daily   cetirizine (ZYRTEC) 10 mg, Oral, Daily PRN   cyclobenzaprine (FLEXERIL) 10 mg, Oral, Daily at bedtime   fluticasone (FLONASE) 50 MCG/ACT nasal spray 2 sprays, Each Nare, Daily PRN   HYDROcodone-acetaminophen (NORCO) 10-325 MG tablet 1 tablet, Oral, Every 6 hours PRN   lidocaine (LIDODERM) 5 % 1 patch, Transdermal, Every 24 hours, Remove & Discard patch within 12 hours or as directed by MD (back/hip pain)   pseudoephedrine (SUDAFED) 30-60 mg, Oral, Every 4 hours PRN   tirzepatide (MOUNJARO) 2.5 mg, Subcutaneous, Weekly    Patient Active Problem List   Diagnosis Date Noted   Degenerative arthritis of knee, bilateral 10/20/2021   Degenerative disc disease, cervical 07/06/2021   Lumbar arthritis 07/06/2021   Somatic dysfunction of spine, cervical 05/19/2021   ADD (attention deficit disorder) 02/28/2021   Primary osteoarthritis of right hip 06/09/2020   Osteoporosis 05/17/2020   Closed fracture of right pubis ([place]) 04/22/2020   Tear of right acetabular labrum 04/22/2020   Chronic right hip pain 04/19/2020   Diabetes mellitus type II, controlled ([place]) 02/20/2020   Hyperlipidemia associated with type 2 diabetes mellitus  ([place]) 02/20/2020   Hypertension 02/03/2020   Hepatic steatosis 02/03/2020   Morbid obesity (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     Objective:     BP 116/78 (BP Location: Left Arm, Patient Position: Sitting, Cuff Size: Large)   Pulse 86   Temp 98.4 F (36.9 C) (Oral)   Ht '5\' 4"'$  (1.626 m)   Wt 209 lb (94.8 kg)   LMP 09/27/2016 (Approximate)   SpO2 96%   BMI 35.87 kg/m    Physical Exam Vitals reviewed.  Constitutional:      Appearance: Normal appearance. She is well-groomed and normal weight.  Eyes:     Conjunctiva/sclera: Conjunctivae normal.  Neck:     Thyroid: No thyromegaly.  Cardiovascular:     Rate and Rhythm: Normal rate and regular rhythm.     Pulses: Normal pulses.     Heart sounds: S1 normal and S2 normal.  Pulmonary:     Effort: Pulmonary effort is normal.     Breath sounds: Normal breath sounds and air entry.  Abdominal:     General:  Bowel sounds are normal.  Musculoskeletal:     Right lower leg: No edema.     Left lower leg: No edema.  Neurological:     Mental Status: She is alert and oriented to person, place, and time. Mental status is at baseline.     Gait: Gait is intact.  Psychiatric:        Mood and Affect: Mood and affect normal.        Speech: Speech normal.        Behavior: Behavior normal.        Judgment: Judgment normal.      Results for orders placed or performed in visit on 02/14/23  POC HgB A1c  Result Value Ref Range   Hemoglobin A1C 5.5 4.0 - 5.6 %   HbA1c POC (<>  result, manual entry)     HbA1c, POC (prediabetic range)     HbA1c, POC (controlled diabetic range)        The 10-year ASCVD risk score (Arnett DK, et al., 2019) is: 9%    Assessment & Plan:   Problem List Items Addressed This Visit       Unprioritized   Hypertension    BP WNL today, not on medication for this but she is losing weight which is likely helping reduce BP. Will continue to monitor BP at every visit.       Diabetes mellitus type II, controlled ([place]) - Primary    A1C performed in office today and is 5.5, she is doing well with the 2.5 mg weekly mounjaro, will continue this, refills sent to pharmacy.      Relevant Medications   tirzepatide [facility]) 2.5 MG/0.5ML Pen   Other Relevant Orders   POC HgB A1c (Completed)   Lumbar arthritis    Is being followed by pain management, on norco 10/325 mg 4 times daily written by them. She continues to complain of pain across her hips in the lower back, I recommended adding NSAIDS like ibuprofen 800 mg every 8 hours and will add cyclobenzaprine 10 mg at bedtime to help reduce muscle stiffness/spasms.       Relevant Medications   HYDROcodone-acetaminophen (NORCO) 10-325 MG tablet   cyclobenzaprine (FLEXERIL) 10 MG tablet   Other Visit Diagnoses     Tobacco dependence       Relevant Orders   Low Dose CT Chest w/o Contrast for Lung Cancer Screening [id number]   Breast cancer screening by mammogram       Relevant Orders   MM Digital Screening       Return in about 6 months (around 08/17/2023) for DM, annual physical exam.    Farrel Conners, MD

## 2023-03-19 ENCOUNTER — Ambulatory Visit
Admission: RE | Admit: 2023-03-19 | Discharge: 2023-03-19 | Disposition: A | Discharge status: Home / Self Care | Payer: 59 | Source: Ambulatory Visit | Attending: Family Medicine | Admitting: Family Medicine

## 2023-03-19 DIAGNOSIS — F17200 Nicotine dependence, unspecified, uncomplicated: Secondary | ICD-10-CM

## 2023-04-04 ENCOUNTER — Encounter: Payer: Self-pay | Admitting: Family Medicine

## 2023-04-04 DIAGNOSIS — G4733 Obstructive sleep apnea (adult) (pediatric): Secondary | ICD-10-CM

## 2023-04-04 NOTE — Telephone Encounter (Signed)
Ok to send referral to [facility], ok to use dx code for OSA.

## 2023-04-16 ENCOUNTER — Encounter: Payer: Self-pay | Admitting: Primary Care

## 2023-04-16 ENCOUNTER — Ambulatory Visit (INDEPENDENT_AMBULATORY_CARE_PROVIDER_SITE_OTHER): Payer: 59 | Admitting: Primary Care

## 2023-04-16 VITALS — BP 136/78 | HR 90 | Temp 99.0°F | Ht 65.0 in | Wt 212.4 lb

## 2023-04-16 DIAGNOSIS — G4733 Obstructive sleep apnea (adult) (pediatric): Secondary | ICD-10-CM

## 2023-04-16 DIAGNOSIS — J329 Chronic sinusitis, unspecified: Secondary | ICD-10-CM

## 2023-04-16 NOTE — Patient Instructions (Addendum)
Recommendations: - Continue medicated nasal spray/ Use saline rinses before bedtime as needed for congestion  - Work on weight loss as able - Focus on side sleeping position or elevate head of bed 30 degress with wedge pillow  - If you still have sleep apnea we will place an order for you to receive new CPAP machine and will recommend you try hybrid full face mask   Orders: - Home sleep re: OSA  Referral: - ENT re: chronic sinusitis/ OSA   Follow-up: Call 2 weeks after turning in sleep study to review results  Follow up in 3 months with [facility] NP

## 2023-04-16 NOTE — Progress Notes (Signed)
@Patient  ID: Lori [name], female    DOB: 30-Jul-1966, 57 y.o.   MRN: [id number]  Chief Complaint  Patient presents with   Consult    Referring provider: Karie Georges, MD  HPI: 57 year old female, current every day smoker 1 ppd (35 pack year hx). PMH significant for OSA on CPAP, GERD, allergic rhinitis, hiatal hernia, TMJ, obesity, tobacco abuse. Former patient of Dr. Milana Obey, last seen by pulmonary NP on 02/07/18. HST 05/2016 AHI 5 but very symptomatic.   Previous [facility]:  07/15/2019 Patient presents today for annual OSA follow-up. She has moderate compliance with CPAP use. She knows her compliance is down. States that she has trouble applying mask d/t previous carpale tunnel and wakes up at night not wearing it. She is using nasal pillow and comes off easily. Had better compliance at last visit when she was wearing a different mask. Due for new head gear August 10th. She has noticed since using CPAP she is not as sleepy during the day. Epworth 10. Still smoking 1 ppd, has not started chantix.   [facility]view download 16/30 days; 40% >4 hours Average usage 4 hours 46 mins Pressure 5-15cm h20 (9.8) Leaks 10.0 L/min AHI 0.6  04/16/2023 Interim hx  Patient presents today for overdue OSA follow-up. She was using CPAP. She had been using full face mask but had issues with skin breakdown to bridge of her nose. She changed to nasal mask but sleeps with her mouth open and had issues with dry mouth.  She was using humidification. Getting sinus infections. Current CPAP machine is from 2017. Typical bedtime is between 9-9:30pm. Takes her 30 mins to fall asleep. She starts her day at 4am. Weight is up 30 lbs. Epworth score 15.    Allergies  Allergen Reactions   Amoxicillin Nausea And Vomiting    Yeast Infection, severe nausea and vomiting    Egg-Derived Products Nausea And Vomiting    With scrambled or cooked eggs (can eat if baked into something i.e. cakes)   Latex Rash   Morphine  Anxiety and Other (See Comments)    REACTION: hyper REACTION: hyper   Torecan [Thiethylperazine] Palpitations and Other (See Comments)    Immunization History  Administered Date(s) Administered   Moderna Sars-Covid-2 Vaccination 03/11/2020, 04/10/2020   PNEUMOCOCCAL CONJUGATE-20 02/28/2021   Tdap 12/23/2013   Zoster Recombinat (Shingrix) 02/28/2021    Past Medical History:  Diagnosis Date   ALLERGIC RHINITIS 03/14/2010   BACK PAIN 01/24/2010   Buttock pain    Constipation    Depression    Diabetes (HCC)    Fatty liver    GERD 12/01/2008   HBP (high blood pressure)    HIATAL HERNIA 05/27/2010   Hot flashes    Hyperlipidemia    Hypertrophy of tongue papillae 03/14/2010   IBS (irritable bowel syndrome)    Joint pain    Numbness and tingling of left upper and lower extremity    OSTEOARTHRITIS 12/01/2008   Osteoporosis 05/17/2020   Fragility fracture plus T score -1.8 on DEXA scan June 2021   Pneumonia    Sciatica, left side    Sleep apnea    Sleep apnea    Thigh pain    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   TOBACCO USE 12/01/2008   VITAMIN D DEFICIENCY 05/23/2010    Tobacco History: Social History   Tobacco Use  Smoking Status Every Day   Packs/day: 1.00   Years: 36.00   Additional pack years: 0.00  Total pack years: 36.00   Types: Cigarettes  Smokeless Tobacco Never   Ready to quit: Not Answered Counseling given: Not Answered   Outpatient Medications Prior to Visit  Medication Sig Dispense Refill   Brimonidine Tartrate (LUMIFY) 0.025 % SOLN Place 1 drop into both eyes in the morning and at bedtime.     cetirizine (ZYRTEC) 10 MG tablet Take 10 mg by mouth daily as needed for allergies.     cyclobenzaprine (FLEXERIL) 10 MG tablet Take 1 tablet (10 mg total) by mouth at bedtime. 30 tablet 2   diclofenac Sodium (VOLTAREN) 1 % GEL Apply 4 g topically 4 (four) times daily.     fluticasone (FLONASE) 50 MCG/ACT nasal spray Place 2 sprays into both nostrils daily as needed  for allergies.     HYDROcodone-acetaminophen (NORCO) 10-325 MG tablet Take 1 tablet by mouth every 6 (six) hours as needed for severe pain. 30 tablet 0   lidocaine (LIDODERM) 5 % Place 1 patch onto the skin daily. Remove & Discard patch within 12 hours or as directed by MD (back/hip pain)     pseudoephedrine (SUDAFED) 30 MG tablet Take 30-60 mg by mouth every 4 (four) hours as needed (headaches).     tirzepatide [facility]) 2.5 MG/0.5ML Pen Inject 2.5 mg into the skin once a week. 2 mL 11   Facility-Administered Medications Prior to Visit  Medication Dose Route Frequency Provider Last Rate Last Admin   0.9 %  sodium chloride infusion  500 mL Intravenous Once Tressia Danas, MD        Review of Systems  Review of Systems  Constitutional: Negative.   Respiratory: Negative.    Cardiovascular: Negative.    Physical Exam  BP 136/78 (BP Location: Left Arm, Patient Position: Sitting, Cuff Size: Large)   Pulse 90   Temp 99 F (37.2 C) (Oral)   Ht 5\' 5"  (1.651 m)   Wt 212 lb 6.4 oz (96.3 kg)   LMP 09/27/2016 (Approximate)   SpO2 96%   BMI 35.35 kg/m  Physical Exam Constitutional:      Appearance: Normal appearance.  HENT:     Head: Normocephalic and atraumatic.  Cardiovascular:     Rate and Rhythm: Normal rate and regular rhythm.  Pulmonary:     Effort: Pulmonary effort is normal.     Breath sounds: Normal breath sounds.  Musculoskeletal:        General: Normal range of motion.  Skin:    General: Skin is warm and dry.  Neurological:     General: No focal deficit present.     Mental Status: She is alert and oriented to person, place, and time. Mental status is at baseline.  Psychiatric:        Mood and Affect: Mood normal.        Behavior: Behavior normal.        Thought Content: Thought content normal.        Judgment: Judgment normal.      Lab Results:  CBC    Component Value Date/Time   WBC 10.7 (H) 09/07/2022 1146   WBC 10.3 05/17/2022 0854   RBC 5.07  09/07/2022 1146   HGB 15.4 (H) 09/07/2022 1146   HCT 45.5 09/07/2022 1146   PLT 223 09/07/2022 1146   MCV 89.7 09/07/2022 1146   MCH 30.4 09/07/2022 1146   MCHC 33.8 09/07/2022 1146   RDW 14.6 09/07/2022 1146   LYMPHSABS 4.2 (H) 09/07/2022 1146   MONOABS 0.5 09/07/2022 1146  EOSABS 0.2 09/07/2022 1146   BASOSABS 0.1 09/07/2022 1146    BMET    Component Value Date/Time   NA 141 05/17/2022 0854   NA 140 11/22/2020 0831   K 4.4 05/17/2022 0854   CL 107 05/17/2022 0854   CO2 25 05/17/2022 0854   GLUCOSE 107 (H) 05/17/2022 0854   BUN 15 05/17/2022 0854   BUN 13 11/22/2020 0831   CREATININE 0.73 05/17/2022 0854   CREATININE 0.82 10/27/2019 1429   CALCIUM 9.5 05/17/2022 0854   GFRNONAA >60 05/17/2022 0854   GFRNONAA >60 10/27/2019 1429   GFRAA 113 11/22/2020 0831   GFRAA >60 10/27/2019 1429    BNP No results found for: "BNP"  ProBNP No results found for: "PROBNP"  Imaging: No results found.   Assessment & Plan:   OSA (obstructive sleep apnea) - HST 05/2016 >> AHI 5  but very symptomatic. She was on CPAP for awhile but stopped due to mask issues. Continues to have sleep disruption and snoring symptoms. Due to break in therapy we will need to repeat home sleep study. She is likely due for new CPAP. FU after sleep study to review results and treatment options.   Recurrent sinusitis - Use saline rinses before bedtime as needed for congestion  - Refer to ENT    Recommendations: - Continue medicated nasal spray/ Use saline rinses before bedtime as needed for congestion  - Work on weight loss as able - Focus on side sleeping position or elevate head of bed 30 degress with wedge pillow  - If you still have sleep apnea we will place an order for you to receive new CPAP machine and will recommend you try hybrid full face mask   Orders: - Home sleep re: OSA  Referral: - ENT re: chronic sinusitis/ OSA   Follow-up: Call 2 weeks after turning in sleep study to review  results  Follow up in 3 months with Beth NP   Glenford Bayley, NP 04/30/2023

## 2023-04-30 DIAGNOSIS — J329 Chronic sinusitis, unspecified: Secondary | ICD-10-CM | POA: Insufficient documentation

## 2023-04-30 NOTE — Assessment & Plan Note (Signed)
-   Use saline rinses before bedtime as needed for congestion  - Refer to ENT

## 2023-04-30 NOTE — Assessment & Plan Note (Signed)
-   HST 05/2016 >> AHI 5  but very symptomatic. She was on CPAP for awhile but stopped due to mask issues. Continues to have sleep disruption and snoring symptoms. Due to break in therapy we will need to repeat home sleep study. She is likely due for new CPAP. FU after sleep study to review results and treatment options.

## 2023-05-08 ENCOUNTER — Ambulatory Visit: Payer: 59

## 2023-05-09 NOTE — Progress Notes (Signed)
Lori [name] Lori [name] [address] [place] [address] Phone: [phone]   Assessment and Plan:     1. Polyarthralgia 2. Neck pain 3. Acute left ankle pain 4. Somatic dysfunction of cervical region 5. Somatic dysfunction of thoracic region 6. Somatic dysfunction of lumbar region 7. Somatic dysfunction of pelvic region 8. Somatic dysfunction of lower extremities -Chronic with exacerbation, subsequent visit - Patient presents with multiple musculoskeletal complaints including left ankle pain, neck pain, upper back pain, lower back pain, right knee pain, and cramping, bilateral hip pain, right hip tightness.  Patient has had no new injury or MOI - Start meloxicam 15 mg daily x2 weeks.  If still having pain after 2 weeks, complete 3rd-week of meloxicam. May use remaining meloxicam as needed once daily for pain control.  Do not to use additional NSAIDs while taking meloxicam.  May use Tylenol [phone] mg 2 to 3 times a day for breakthrough pain. - Start HEP for ankle - Patient has received significant relief with OMT in the past.  Elects for repeat OMT today.  Tolerated well per note below. - Decision today to treat with OMT was based on Physical Exam  After verbal consent patient was treated with HVLA (high velocity low amplitude), ME (muscle energy), FPR (flex positional release), ST (soft tissue), PC/PD (Pelvic Compression/ Pelvic Decompression) techniques in cervical, lower extremity, thoracic, lumbar, and pelvic areas. Patient tolerated the procedure well with improvement in symptoms.  Patient educated on potential side effects of soreness and recommended to rest, hydrate, and use Tylenol as needed for pain control.   Other orders - meloxicam (MOBIC) 15 MG tablet; Take 1 tablet (15 mg total) by mouth daily.    Pertinent previous records reviewed include none   Follow Up: 4 weeks for reevaluation.  Could x-ray areas that remain painful.   Could consider repeat OMT   Subjective:   I, Lori [name], am serving as a Neurosurgeon for Doctor Richardean Sale  Chief Complaint: hip pain   HPI:   10/19/2022 Lori [name] is a 57 y.o. female coming in with complaint of back and neck pain. OMT 09/16/2022. Patient states that just below scapula her back is tight.    R knee pain is still grinding when she goes up the stairs. Numbness in quad over incision from THR. Pain can shoot down to the foot.    Medications patient has been prescribed: None   05/16/2023 Patient is a 57 year old female complaining of hip pain. Patient states that her low back has been bothering her and her right ankle she gets a cramp no MOI , states her "ankle was swollen her middle two toes it feels like something is under them " and when she moves them she feels a cramp coming on , she has neck tightness and left hand cramps, also has knee pain , she is here for an adjustment and to check out her left ankle   Relevant Historical Information: Hypertension, GERD, DM type II, chronic pain on opioid medication, morbid obesity  Additional pertinent review of systems negative.   Current Outpatient Medications:    Brimonidine Tartrate (LUMIFY) 0.025 % SOLN, Place 1 drop into both eyes in the morning and at bedtime., Disp: , Rfl:    cetirizine (ZYRTEC) 10 MG tablet, Take 10 mg by mouth daily as needed for allergies., Disp: , Rfl:    cyclobenzaprine (FLEXERIL) 10 MG tablet, Take 1 tablet (10 mg total) by mouth  at bedtime., Disp: 30 tablet, Rfl: 2   diclofenac Sodium (VOLTAREN) 1 % GEL, Apply 4 g topically 4 (four) times daily., Disp: , Rfl:    fluticasone (FLONASE) 50 MCG/ACT nasal spray, Place 2 sprays into both nostrils daily as needed for allergies., Disp: , Rfl:    HYDROcodone-acetaminophen (NORCO) 10-325 MG tablet, Take 1 tablet by mouth every 6 (six) hours as needed for severe pain., Disp: 30 tablet, Rfl: 0   lidocaine (LIDODERM) 5 %, Place 1 patch onto the skin  daily. Remove & Discard patch within 12 hours or as directed by MD (back/hip pain), Disp: , Rfl:    meloxicam (MOBIC) 15 MG tablet, Take 1 tablet (15 mg total) by mouth daily., Disp: 30 tablet, Rfl: 0   pseudoephedrine (SUDAFED) 30 MG tablet, Take 30-60 mg by mouth every 4 (four) hours as needed (headaches)., Disp: , Rfl:    tirzepatide (MOUNJARO) 2.5 MG/0.5ML Pen, Inject 2.5 mg into the skin once a week., Disp: 2 mL, Rfl: 11  Current Facility-Administered Medications:    0.9 %  sodium chloride infusion, 500 mL, Intravenous, Once, Tressia Danas, MD   Objective:     Vitals:   05/16/23 1503  BP: 110/72  Pulse: 81  SpO2: 97%  Weight: 215 lb (97.5 kg)  Height: 5\' 5"  (1.651 m)      Body mass index is 35.78 kg/m.    Physical Exam:     General: Well-appearing, cooperative, sitting comfortably in no acute distress.   OMT Physical Exam:  ASIS Compression Test: Positive Right Cervical: TTP paraspinal, reduced rotation bilaterally Lower extremity: TTP anterior left hip joint line, left ATFL, left CFL.  Pain with passive left ankle inversion Thoracic: TTP paraspinal, T6 RRSR flexed Lumbar: TTP paraspinal, L2 RLSL flexed Pelvis: Right anterior innominate    Electronically signed by:  Lori [name] Lori [name] 3:39 PM 05/16/23

## 2023-05-16 ENCOUNTER — Ambulatory Visit: Payer: 59 | Admitting: Sports Medicine

## 2023-05-16 VITALS — BP 110/72 | HR 81 | Ht 65.0 in | Wt 215.0 lb

## 2023-05-16 DIAGNOSIS — M9902 Segmental and somatic dysfunction of thoracic region: Secondary | ICD-10-CM

## 2023-05-16 DIAGNOSIS — M2550 Pain in unspecified joint: Secondary | ICD-10-CM

## 2023-05-16 DIAGNOSIS — M9905 Segmental and somatic dysfunction of pelvic region: Secondary | ICD-10-CM | POA: Diagnosis not present

## 2023-05-16 DIAGNOSIS — M9901 Segmental and somatic dysfunction of cervical region: Secondary | ICD-10-CM | POA: Diagnosis not present

## 2023-05-16 DIAGNOSIS — M542 Cervicalgia: Secondary | ICD-10-CM

## 2023-05-16 DIAGNOSIS — M25572 Pain in left ankle and joints of left foot: Secondary | ICD-10-CM | POA: Diagnosis not present

## 2023-05-16 DIAGNOSIS — M9903 Segmental and somatic dysfunction of lumbar region: Secondary | ICD-10-CM

## 2023-05-16 DIAGNOSIS — M9906 Segmental and somatic dysfunction of lower extremity: Secondary | ICD-10-CM

## 2023-05-16 MED ORDER — MELOXICAM 15 MG PO TABS: 15.0000 mg | ORAL_TABLET | Freq: Every day | ORAL | 0 refills | Status: DC

## 2023-05-16 NOTE — Patient Instructions (Signed)
-   Start meloxicam 15 mg daily x2 weeks.  If still having pain after 2 weeks, complete 3rd-week of meloxicam. May use remaining meloxicam as needed once daily for pain control.  Do not to use additional NSAIDs while taking meloxicam.  May use Tylenol [phone] mg 2 to 3 times a day for breakthrough pain. 4 week follow up  Ankle HEP

## 2023-05-20 ENCOUNTER — Other Ambulatory Visit: Payer: Self-pay | Admitting: Family Medicine

## 2023-05-20 DIAGNOSIS — E1165 Type 2 diabetes mellitus with hyperglycemia: Secondary | ICD-10-CM

## 2023-05-23 ENCOUNTER — Ambulatory Visit
Admission: RE | Admit: 2023-05-23 | Discharge: 2023-05-23 | Disposition: A | Discharge status: Home / Self Care | Payer: 59 | Source: Ambulatory Visit | Attending: Family Medicine | Admitting: Family Medicine

## 2023-05-23 DIAGNOSIS — Z1231 Encounter for screening mammogram for malignant neoplasm of breast: Secondary | ICD-10-CM

## 2023-05-25 ENCOUNTER — Other Ambulatory Visit: Payer: Self-pay | Admitting: *Deleted

## 2023-05-25 DIAGNOSIS — R928 Other abnormal and inconclusive findings on diagnostic imaging of breast: Secondary | ICD-10-CM

## 2023-05-25 NOTE — Progress Notes (Signed)
Needs diagnostic mammogram and [name] left breast- not sure if the breast center puts these orders in or we need to

## 2023-05-31 ENCOUNTER — Telehealth: Payer: Self-pay | Admitting: Nurse Practitioner

## 2023-06-01 ENCOUNTER — Ambulatory Visit (INDEPENDENT_AMBULATORY_CARE_PROVIDER_SITE_OTHER): Payer: 59 | Admitting: Primary Care

## 2023-06-01 DIAGNOSIS — G4733 Obstructive sleep apnea (adult) (pediatric): Secondary | ICD-10-CM

## 2023-06-05 ENCOUNTER — Ambulatory Visit: Admission: RE | Admit: 2023-06-05 | Payer: 59 | Source: Ambulatory Visit

## 2023-06-05 ENCOUNTER — Ambulatory Visit
Admission: RE | Admit: 2023-06-05 | Discharge: 2023-06-05 | Disposition: A | Discharge status: Home / Self Care | Payer: 59 | Source: Ambulatory Visit | Attending: Family Medicine | Admitting: Family Medicine

## 2023-06-05 DIAGNOSIS — R928 Other abnormal and inconclusive findings on diagnostic imaging of breast: Secondary | ICD-10-CM

## 2023-06-05 NOTE — Progress Notes (Signed)
Lori [name] D.[name] [address] [place] [address] Phone: [phone]   Assessment and Plan:     1. Chronic pain of left ankle -Chronic with exacerbation, initial sports medicine visit - Patient presented today with multiple musculoskeletal complaints, however her most prominent pain has been left ankle pain radiating around ankle, through posterior ankle, and up posterior leg through calf.  Patient had a relatively unremarkable physical exam and x-ray imaging, so I am not sure the cause of patient's ongoing left lower leg and ankle pain.  Patient does have chronic back pain with radicular symptoms that have required epidural CSI in the past.  We will treat patient's pain conservatively with calf and Achilles stretches to see if there is a contributing musculotendinous factor, but I recommend that patient follow-up with pain management to discuss further epidural injections - X-ray obtained in clinic.  My interpretation: No acute fracture or dislocation.  Inferior calcaneal spur that is not associated with patient's area of pain.  Otherwise relatively unremarkable exam -Continue following with pain management for pain medications  Pertinent previous records reviewed include none   Follow Up: As needed.  Could consider repeat OMT for areas of musculoskeletal pain   Subjective:   I, Lori [name], am serving as a Neurosurgeon for Doctor Lori [name]   Chief Complaint: hip pain    HPI:    10/19/2022 Lori [name] is a 58 y.o. female coming in with complaint of back and neck pain. OMT 09/16/2022. Patient states that just below scapula her back is tight.    R knee pain is still grinding when she goes up the stairs. Numbness in quad over incision from THR. Pain can shoot down to the foot.    Medications patient has been prescribed: None     05/16/2023 Patient is a 57 year old female complaining of hip pain. Patient states that her low back has been  bothering her and her right ankle she gets a cramp no MOI , states her "ankle was swollen her middle two toes it feels like something is under them " and when she moves them she feels a cramp coming on , she has neck tightness and left hand cramps, also has knee pain , she is here for an adjustment and to check out her left ankle   06/13/2023 Patient states left side and ankle tightness, meloxicam helped a little with the hands  and hip . but not much anywhere else     Relevant Historical Information: Hypertension, GERD, DM type II, chronic pain on opioid medication, morbid obesity  Additional pertinent review of systems negative.  Current Outpatient Medications  Medication Sig Dispense Refill   Brimonidine Tartrate (LUMIFY) 0.025 % SOLN Place 1 drop into both eyes in the morning and at bedtime.     cetirizine (ZYRTEC) 10 MG tablet Take 10 mg by mouth daily as needed for allergies.     cyclobenzaprine (FLEXERIL) 10 MG tablet Take 1 tablet (10 mg total) by mouth at bedtime. 30 tablet 2   diclofenac Sodium (VOLTAREN) 1 % GEL Apply 4 g topically 4 (four) times daily.     fluticasone (FLONASE) 50 MCG/ACT nasal spray Place 2 sprays into both nostrils daily as needed for allergies.     HYDROcodone-acetaminophen (NORCO) 10-325 MG tablet Take 1 tablet by mouth every 6 (six) hours as needed for severe pain. 30 tablet 0   lidocaine (LIDODERM) 5 % Place 1 patch onto the skin daily.  Remove & Discard patch within 12 hours or as directed by MD (back/hip pain)     meloxicam (MOBIC) 15 MG tablet Take 1 tablet (15 mg total) by mouth daily. 30 tablet 0   pseudoephedrine (SUDAFED) 30 MG tablet Take 30-60 mg by mouth every 4 (four) hours as needed (headaches).     tirzepatide (MOUNJARO) 2.5 MG/0.5ML Pen INJECT 2.5 MG SUBCUTANEOUSLY WEEKLY 2 mL 5   Current Facility-Administered Medications  Medication Dose Route Frequency Provider Last Rate Last Admin   0.9 %  sodium chloride infusion  500 mL Intravenous Once  Lori Danas, MD          Objective:     Vitals:   06/13/23 1559  Pulse: 89  SpO2: 97%  Weight: 217 lb (98.4 kg)  Height: 5\' 5"  (1.651 m)      Body mass index is 36.11 kg/m.    Physical Exam:     Gen: Appears well, nad, nontoxic and pleasant Psych: Alert and oriented, appropriate mood and affect Neuro: sensation intact, strength is 5/5 with df/pf/inv/ev, muscle tone wnl Skin: no susupicious lesions or rashes  Left ankle:  No deformity, no swelling or effusion TTP anterior joint line, lateral malleolus, medial malleolus, Achilles, gastroc, navicular NTTP over  base of 5th, ATFL, CFL, deltoid, calcaneous  ROM DF 30, PF 45, inv/ev intact Negative ant drawer, talar tilt, rotation test, squeeze test.  Electronically signed by:  Lori [name] D.[name] 4:37 PM 06/13/23

## 2023-06-08 ENCOUNTER — Inpatient Hospital Stay: Payer: 59

## 2023-06-08 ENCOUNTER — Inpatient Hospital Stay: Payer: 59 | Admitting: Nurse Practitioner

## 2023-06-12 ENCOUNTER — Other Ambulatory Visit: Payer: Self-pay | Admitting: Sports Medicine

## 2023-06-12 NOTE — Progress Notes (Signed)
Please let patient know HST showed 05/16/23 showed severe sleep apnea, AHI 58 events an hour. Please scheduled virtual visit to review results and discuss CPAP

## 2023-06-13 ENCOUNTER — Ambulatory Visit: Payer: 59 | Admitting: Sports Medicine

## 2023-06-13 ENCOUNTER — Ambulatory Visit (INDEPENDENT_AMBULATORY_CARE_PROVIDER_SITE_OTHER): Payer: 59

## 2023-06-13 VITALS — HR 89 | Ht 65.0 in | Wt 217.0 lb

## 2023-06-13 DIAGNOSIS — M25572 Pain in left ankle and joints of left foot: Secondary | ICD-10-CM

## 2023-06-13 DIAGNOSIS — G8929 Other chronic pain: Secondary | ICD-10-CM

## 2023-06-13 NOTE — Patient Instructions (Signed)
Calf and achilles HEP  As needed follow up Recommend talking with pain management about back injections

## 2023-06-15 ENCOUNTER — Inpatient Hospital Stay: Payer: 59 | Attending: Oncology

## 2023-06-15 ENCOUNTER — Encounter: Payer: Self-pay | Admitting: Nurse Practitioner

## 2023-06-15 ENCOUNTER — Inpatient Hospital Stay: Payer: 59 | Admitting: Nurse Practitioner

## 2023-06-15 VITALS — BP 147/88 | HR 81 | Temp 98.2°F | Resp 18 | Ht 65.0 in | Wt 213.4 lb

## 2023-06-15 DIAGNOSIS — E785 Hyperlipidemia, unspecified: Secondary | ICD-10-CM | POA: Insufficient documentation

## 2023-06-15 DIAGNOSIS — G8929 Other chronic pain: Secondary | ICD-10-CM | POA: Insufficient documentation

## 2023-06-15 DIAGNOSIS — I10 Essential (primary) hypertension: Secondary | ICD-10-CM | POA: Diagnosis not present

## 2023-06-15 DIAGNOSIS — D72829 Elevated white blood cell count, unspecified: Secondary | ICD-10-CM

## 2023-06-15 DIAGNOSIS — M549 Dorsalgia, unspecified: Secondary | ICD-10-CM | POA: Diagnosis not present

## 2023-06-15 DIAGNOSIS — D72820 Lymphocytosis (symptomatic): Secondary | ICD-10-CM | POA: Insufficient documentation

## 2023-06-15 DIAGNOSIS — Z720 Tobacco use: Secondary | ICD-10-CM | POA: Insufficient documentation

## 2023-06-15 DIAGNOSIS — E119 Type 2 diabetes mellitus without complications: Secondary | ICD-10-CM | POA: Insufficient documentation

## 2023-06-15 DIAGNOSIS — G4730 Sleep apnea, unspecified: Secondary | ICD-10-CM | POA: Insufficient documentation

## 2023-06-15 LAB — CBC WITH DIFFERENTIAL (CANCER CENTER ONLY)
Abs Immature Granulocytes: 0.02 10*3/uL (ref 0.00–0.07)
Basophils Absolute: 0.1 10*3/uL (ref 0.0–0.1)
Basophils Relative: 1 %
Eosinophils Absolute: 0.3 10*3/uL (ref 0.0–0.5)
Eosinophils Relative: 3 %
HCT: 45.9 % (ref 36.0–46.0)
Hemoglobin: 15.4 g/dL — ABNORMAL HIGH (ref 12.0–15.0)
Immature Granulocytes: 0 %
Lymphocytes Relative: 29 %
Lymphs Abs: 2.9 10*3/uL (ref 0.7–4.0)
MCH: 30.6 pg (ref 26.0–34.0)
MCHC: 33.6 g/dL (ref 30.0–36.0)
MCV: 91.1 fL (ref 80.0–100.0)
Monocytes Absolute: 0.6 10*3/uL (ref 0.1–1.0)
Monocytes Relative: 6 %
Neutro Abs: 6 10*3/uL (ref 1.7–7.7)
Neutrophils Relative %: 61 %
Platelet Count: 187 10*3/uL (ref 150–400)
RBC: 5.04 MIL/uL (ref 3.87–5.11)
RDW: 13.6 % (ref 11.5–15.5)
WBC Count: 9.8 10*3/uL (ref 4.0–10.5)
nRBC: 0 % (ref 0.0–0.2)

## 2023-06-15 NOTE — Progress Notes (Signed)
  [facility] OFFICE PROGRESS NOTE   Diagnosis: Leukocytosis  INTERVAL HISTORY:   Lori [name] returns as scheduled.  Stable chronic back pain.  She continues to smoke.  No fever.  Periodic sweats which have improved.  She relates the sweats to "hot flashes".  She has a good appetite.  No weight loss.  Objective:  Vital signs in last 24 hours:  Blood pressure (!) 147/88, pulse 81, temperature 98.2 F (36.8 C), temperature source Oral, resp. rate 18, height 5\' 5"  (1.651 m), weight 213 lb 6.4 oz (96.8 kg), last menstrual period 09/27/2016, SpO2 98 %.    HEENT: No thrush or ulcers. Lymphatics: No palpable cervical, supraclavicular, axillary or inguinal lymph nodes. Resp: Lungs clear bilaterally. Cardio: Regular rate and rhythm. GI: No hepatosplenomegaly. Vascular: No leg edema.   Lab Results:  Lab Results  Component Value Date   WBC 9.8 06/15/2023   HGB 15.4 (H) 06/15/2023   HCT 45.9 06/15/2023   MCV 91.1 06/15/2023   PLT 187 06/15/2023   NEUTROABS 6.0 06/15/2023    Imaging:  No results found.  Medications: I have reviewed the patient's current medications.  Assessment/Plan: Lymphocytosis-chronic and mild Peripheral blood flow cytometry 05/22/2022-no monoclonal B-cell population, predominance of T cells with nonspecific changes   Ongoing tobacco use Chronic back pain Diabetes Hyperlipidemia Sleep apnea Hypertension    Disposition: Lori [name] remains stable from a hematologic standpoint.  The white count/lymphocyte count in normal range today.  Plan to continue to follow with observation.  She continues to smoke.  I encouraged her to discontinue tobacco use.  She is having low-dose annual CT chest.  She will return for a CBC and office visit in 1 year.  We are available to see her sooner if needed.    Lori [name] ANP/GNP-BC   06/15/2023  9:06 AM

## 2023-07-11 ENCOUNTER — Ambulatory Visit (INDEPENDENT_AMBULATORY_CARE_PROVIDER_SITE_OTHER): Payer: 59 | Admitting: Primary Care

## 2023-07-11 ENCOUNTER — Encounter: Payer: Self-pay | Admitting: Primary Care

## 2023-07-11 VITALS — BP 138/74 | HR 103 | Ht 65.0 in | Wt 214.6 lb

## 2023-07-11 DIAGNOSIS — G4733 Obstructive sleep apnea (adult) (pediatric): Secondary | ICD-10-CM | POA: Diagnosis not present

## 2023-07-11 DIAGNOSIS — F17200 Nicotine dependence, unspecified, uncomplicated: Secondary | ICD-10-CM | POA: Diagnosis not present

## 2023-07-11 NOTE — Progress Notes (Signed)
@Patient  ID: Lori [name], female    DOB: 03/13/1966, 57 y.o.   MRN: [id number]  Chief Complaint  Patient presents with   Follow-up    HST 06/01/23    Referring provider: Karie Georges, MD  HPI: 57 year old female, current every day smoker 1 ppd (35 pack year hx). PMH significant for OSA on CPAP, GERD, allergic rhinitis, hiatal hernia, TMJ, obesity, tobacco abuse. Former patient of Dr. Milana Obey, last seen by pulmonary NP on 02/07/18. HST 05/2016 AHI 5 but very symptomatic.   Previous [facility]:  07/15/2019 Patient presents today for annual OSA follow-up. She has moderate compliance with CPAP use. She knows her compliance is down. States that she has trouble applying mask d/t previous carpale tunnel and wakes up at night not wearing it. She is using nasal pillow and comes off easily. Had better compliance at last visit when she was wearing a different mask. Due for new head gear August 10th. She has noticed since using CPAP she is not as sleepy during the day. Epworth 10. Still smoking 1 ppd, has not started chantix.   Airview download 16/30 days; 40% >4 hours Average usage 4 hours 46 mins Pressure 5-15cm h20 (9.8) Leaks 10.0 L/min AHI 0.6  04/16/2023  Patient presents today for overdue OSA follow-up. She was using CPAP. She had been using full face mask but had issues with skin breakdown to bridge of her nose. She changed to nasal mask but sleeps with her mouth open and had issues with dry mouth.  She was using humidification. Getting sinus infections. Current CPAP machine is from 2017. Typical bedtime is between 9-9:30pm. Takes her 30 mins to fall asleep. She starts her day at 4am. Weight is up 30 lbs. Epworth score 15.    07/11/2023-Interim hx  Patient presents today to review sleep study results. She has a history of sleep apnea, seen for sleep consult on 04/16/23. Original sleep study was done in 2017 which showed mild OSA, AHI 5.  Patient was on CPAP for a while but stopped  wearing due to mask issues. Nasal mask caused skin breakdown.  She continued to have sleep disruption and snoring symptoms. Epworth score 15/24. She wakes up around 1 or 2 am. She will fall back to sleep around 3 and needs to be up at 4am. She struggles getting up in the morning. She wakes up with dry mouth. She has some brain fog.  Repeat sleep study was obtained due to break in therapy. HST 05/16/23 showed severe OSA, AHI 58 with SpO2 low 84%.  Reviewed sleep study results with patient today, patient in agreement to restart CPAP.    Allergies  Allergen Reactions   Amoxicillin Nausea And Vomiting    Yeast Infection, severe nausea and vomiting    Egg-Derived Products Nausea And Vomiting    With scrambled or cooked eggs (can eat if baked into something i.e. cakes)   Latex Rash   Morphine Anxiety and Other (See Comments)    REACTION: hyper REACTION: hyper   Torecan [Thiethylperazine] Palpitations and Other (See Comments)    Immunization History  Administered Date(s) Administered   Moderna Sars-Covid-2 Vaccination 03/11/2020, 04/10/2020   PNEUMOCOCCAL CONJUGATE-20 02/28/2021   Tdap 12/23/2013   Zoster Recombinant(Shingrix) 02/28/2021    Past Medical History:  Diagnosis Date   ALLERGIC RHINITIS 03/14/2010   BACK PAIN 01/24/2010   Buttock pain    Constipation    Depression    Diabetes (HCC)    Fatty  liver    GERD 12/01/2008   HBP (high blood pressure)    HIATAL HERNIA 05/27/2010   Hot flashes    Hyperlipidemia    Hypertrophy of tongue papillae 03/14/2010   IBS (irritable bowel syndrome)    Joint pain    Numbness and tingling of left upper and lower extremity    OSTEOARTHRITIS 12/01/2008   Osteoporosis 05/17/2020   Fragility fracture plus T score -1.8 on DEXA scan June 2021   Pneumonia    Sciatica, left side    Sleep apnea    Sleep apnea    Thigh pain    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   TOBACCO USE 12/01/2008   VITAMIN D DEFICIENCY 05/23/2010    Tobacco History: Social  History   Tobacco Use  Smoking Status Every Day   Current packs/day: 1.00   Average packs/day: 1 pack/day for 36.0 years (36.0 ttl pk-yrs)   Types: Cigarettes  Smokeless Tobacco Never   Ready to quit: Not Answered Counseling given: Not Answered   Outpatient Medications Prior to Visit  Medication Sig Dispense Refill   Brimonidine Tartrate (LUMIFY) 0.025 % SOLN Place 1 drop into both eyes in the morning and at bedtime.     cetirizine (ZYRTEC) 10 MG tablet Take 10 mg by mouth daily as needed for allergies.     cyclobenzaprine (FLEXERIL) 10 MG tablet Take 1 tablet (10 mg total) by mouth at bedtime. 30 tablet 2   diclofenac Sodium (VOLTAREN) 1 % GEL Apply 4 g topically 4 (four) times daily.     fluticasone (FLONASE) 50 MCG/ACT nasal spray Place 2 sprays into both nostrils daily as needed for allergies.     HYDROcodone-acetaminophen (NORCO) 10-325 MG tablet Take 1 tablet by mouth every 6 (six) hours as needed for severe pain. 30 tablet 0   lidocaine (LIDODERM) 5 % Place 1 patch onto the skin daily. Remove & Discard patch within 12 hours or as directed by MD (back/hip pain)     pseudoephedrine (SUDAFED) 30 MG tablet Take 30-60 mg by mouth every 4 (four) hours as needed (headaches).     tirzepatide (MOUNJARO) 2.5 MG/0.5ML Pen INJECT 2.5 MG SUBCUTANEOUSLY WEEKLY 2 mL 5   Facility-Administered Medications Prior to Visit  Medication Dose Route Frequency Provider Last Rate Last Admin   0.9 %  sodium chloride infusion  500 mL Intravenous Once Tressia Danas, MD          Review of Systems  Review of Systems  Constitutional:  Positive for fatigue.  HENT: Negative.    Respiratory: Negative.    Psychiatric/Behavioral:  Positive for sleep disturbance.      Physical Exam  BP 138/74 (BP Location: Left Arm, Patient Position: Sitting, Cuff Size: Normal)   Pulse (!) 103   Ht 5\' 5"  (1.651 m)   Wt 214 lb 9.6 oz (97.3 kg)   LMP 09/27/2016 (Approximate)   SpO2 98%   BMI 35.71 kg/m   Physical Exam Constitutional:      Appearance: Normal appearance.  HENT:     Head: Normocephalic and atraumatic.     Mouth/Throat:     Mouth: Mucous membranes are moist.     Pharynx: Oropharynx is clear.  Cardiovascular:     Rate and Rhythm: Normal rate and regular rhythm.  Pulmonary:     Effort: Pulmonary effort is normal.     Breath sounds: Normal breath sounds.  Musculoskeletal:        General: Normal range of motion.  Skin:    General:  Skin is warm and dry.  Neurological:     General: No focal deficit present.     Mental Status: She is alert and oriented to person, place, and time. Mental status is at baseline.  Psychiatric:        Mood and Affect: Mood normal.        Behavior: Behavior normal.        Thought Content: Thought content normal.        Judgment: Judgment normal.      Lab Results:  CBC    Component Value Date/Time   WBC 9.8 06/15/2023 0822   WBC 10.3 05/17/2022 0854   RBC 5.04 06/15/2023 0822   HGB 15.4 (H) 06/15/2023 0822   HCT 45.9 06/15/2023 0822   PLT 187 06/15/2023 0822   MCV 91.1 06/15/2023 0822   MCH 30.6 06/15/2023 0822   MCHC 33.6 06/15/2023 0822   RDW 13.6 06/15/2023 0822   LYMPHSABS 2.9 06/15/2023 0822   MONOABS 0.6 06/15/2023 0822   EOSABS 0.3 06/15/2023 0822   BASOSABS 0.1 06/15/2023 0822    BMET    Component Value Date/Time   NA 141 05/17/2022 0854   NA 140 11/22/2020 0831   K 4.4 05/17/2022 0854   CL 107 05/17/2022 0854   CO2 25 05/17/2022 0854   GLUCOSE 107 (H) 05/17/2022 0854   BUN 15 05/17/2022 0854   BUN 13 11/22/2020 0831   CREATININE 0.73 05/17/2022 0854   CREATININE 0.82 10/27/2019 1429   CALCIUM 9.5 05/17/2022 0854   GFRNONAA >60 05/17/2022 0854   GFRNONAA >60 10/27/2019 1429   GFRAA 113 11/22/2020 0831   GFRAA >60 10/27/2019 1429    BNP No results found for: "BNP"  ProBNP No results found for: "PROBNP"  Imaging: DG Ankle Complete Left  Result Date: 06/19/2023 CLINICAL DATA:  Left ankle pain and  swelling.  No known injury. EXAM: LEFT ANKLE COMPLETE - 3+ VIEW COMPARISON:  Left foot radiographs 10/24/2021 FINDINGS: The ankle mortise is symmetric and intact. Mild-to-moderate plantar calcaneal heel spur. Minimal distal fibular degenerative spurring. No acute fracture or dislocation. IMPRESSION: 1. No acute fracture or dislocation. 2. Mild-to-moderate plantar calcaneal heel spur. Electronically Signed   By: Neita Garnet M.D.   On: 06/19/2023 17:29     Assessment & Plan:   OSA (obstructive sleep apnea) - History of sleep apnea.  Original sleep study in 2017 showed mild OSA but patient was very symptomatic.  She was started on auto CPAP but stopped wearing due to mask issues.  Repeat HST 05/16/2023 showed severe OSA, AHI 58/hour.  Reviewed sleep study results today, risks of untreated sleep apnea and treatment options.  Recommend resuming auto CPAP 5 to 15 cm H2O, recommend she try Philips DreamWear hybrid fullface mask.  If she does not tolerate this particular mask would send her for mask desensitization study.  Advised patient aim to wear CPAP nightly for 4 to 6 hours or longer.  Continue to work on weight loss and focus on side sleeping position.  Follow-up 31 to 90 days after receiving new CPAP for compliance check.  TOBACCO USE - Following with lung cancer screening program. LDCT 03/19/23>> Mild centrilobular emphysema. Smoking related respiratory bronchiolitis. Calcified granulomas. 2.6 mm nodule along the minor fissure. No new pulmonary nodules. She does not have daily respiratory symptoms. No active dx of COPD. No PFTs on file. Smoking cessation encouraged   Glenford Bayley, NP 07/11/2023

## 2023-07-11 NOTE — Assessment & Plan Note (Signed)
-   History of sleep apnea.  Original sleep study in 2017 showed mild OSA but patient was very symptomatic.  She was started on auto CPAP but stopped wearing due to mask issues.  Repeat HST 05/16/2023 showed severe OSA, AHI 58/hour.  Reviewed sleep study results today, risks of untreated sleep apnea and treatment options.  Recommend resuming auto CPAP 5 to 15 cm H2O, recommend she try Philips DreamWear hybrid fullface mask.  If she does not tolerate this particular mask would send her for mask desensitization study.  Advised patient aim to wear CPAP nightly for 4 to 6 hours or longer.  Continue to work on weight loss and focus on side sleeping position.  Follow-up 31 to 90 days after receiving new CPAP for compliance check.

## 2023-07-11 NOTE — Patient Instructions (Addendum)
Recommendations: Aim to wear CPAP nightly 4-6 hour or longer  Look at getting wedge pillow/ can look at medcline pillow  Try phillips dream wear hybrid full face mask  Smoking cessation strongly encouraged  Orders: CPAP auto settings 5-15cm h20   Follow-up: Please call to schedule a follow-up 31-90 days after getting new CPAP for compliance check (can be virtual)  CPAP and BIPAP Information CPAP and BIPAP are methods that use air pressure to keep your airways open and to help you breathe well. CPAP and BIPAP use different amounts of pressure. Your health care provider will tell you whether CPAP or BIPAP would be more helpful for you. CPAP stands for "continuous positive airway pressure." With CPAP, the amount of pressure stays the same while you breathe in (inhale) and out (exhale). BIPAP stands for "bi-level positive airway pressure." With BIPAP, the amount of pressure will be higher when you inhale and lower when you exhale. This allows you to take larger breaths. CPAP or BIPAP may be used in the hospital, or your health care provider may want you to use it at home. You may need to have a sleep study before your health care provider can order a machine for you to use at home. What are the advantages? CPAP or BIPAP can be helpful if you have: Sleep apnea. Chronic obstructive pulmonary disease (COPD). Heart failure. Medical conditions that cause muscle weakness, including muscular dystrophy or amyotrophic lateral sclerosis (ALS). Other problems that cause breathing to be shallow, weak, abnormal, or difficult. CPAP and BIPAP are most commonly used for obstructive sleep apnea (OSA) to keep the airways from collapsing when the muscles relax during sleep. What are the risks? Generally, this is a safe treatment. However, problems may occur, including: Irritated skin or skin sores if the mask does not fit properly. Dry or stuffy nose or nosebleeds. Dry mouth. Feeling gassy or bloated. Sinus  or lung infection if the equipment is not cleaned properly. When should CPAP or BIPAP be used? In most cases, the mask only needs to be worn during sleep. Generally, the mask needs to be worn throughout the night and during any daytime naps. People with certain medical conditions may also need to wear the mask at other times, such as when they are awake. Follow instructions from your health care provider about when to use the machine. What happens during CPAP or BIPAP?  Both CPAP and BIPAP are provided by a small machine with a flexible plastic tube that attaches to a plastic mask that you wear. Air is blown through the mask into your nose or mouth. The amount of pressure that is used to blow the air can be adjusted on the machine. Your health care provider will set the pressure setting and help you find the best mask for you. Tips for using the mask Because the mask needs to be snug, some people feel trapped or closed-in (claustrophobic) when first using the mask. If you feel this way, you may need to get used to the mask. One way to do this is to hold the mask loosely over your nose or mouth and then gradually apply the mask more snugly. You can also gradually increase the amount of time that you use the mask. Masks are available in various types and sizes. If your mask does not fit well, talk with your health care provider about getting a different one. Some common types of masks include: Full face masks, which fit over the mouth and nose. Nasal  masks, which fit over the nose. Nasal pillow or prong masks, which fit into the nostrils. If you are using a mask that fits over your nose and you tend to breathe through your mouth, a chin strap may be applied to help keep your mouth closed. Use a skin barrier to protect your skin as told by your health care provider. Some CPAP and BIPAP machines have alarms that may sound if the mask comes off or develops a leak. If you have trouble with the mask, it is  very important that you talk with your health care provider about finding a way to make the mask easier to tolerate. Do not stop using the mask. There could be a negative impact on your health if you stop using the mask. Tips for using the machine Place your CPAP or BIPAP machine on a secure table or stand near an electrical outlet. Know where the on/off switch is on the machine. Follow instructions from your health care provider about how to set the pressure on your machine and when you should use it. Do not eat or drink while the CPAP or BIPAP machine is on. Food or fluids could get pushed into your lungs by the pressure of the CPAP or BIPAP. For home use, CPAP and BIPAP machines can be rented or purchased through home health care companies. Many different brands of machines are available. Renting a machine before purchasing may help you find out which particular machine works well for you. Your health insurance company may also decide which machine you may get. Keep the CPAP or BIPAP machine and attachments clean. Ask your health care provider for specific instructions. Check the humidifier if you have a dry stuffy nose or nosebleeds. Make sure it is working correctly. Follow these instructions at home: Take over-the-counter and prescription medicines only as told by your health care provider. Ask if you can take sinus medicine if your sinuses are blocked. Do not use any products that contain nicotine or tobacco. These products include cigarettes, chewing tobacco, and vaping devices, such as e-cigarettes. If you need help quitting, ask your health care provider. Keep all follow-up visits. This is important. Contact a health care provider if: You have redness or pressure sores on your head, face, mouth, or nose from the mask or head gear. You have trouble using the CPAP or BIPAP machine. You cannot tolerate wearing the CPAP or BIPAP mask. Someone tells you that you snore even when wearing your  CPAP or BIPAP. Get help right away if: You have trouble breathing. You feel confused. Summary CPAP and BIPAP are methods that use air pressure to keep your airways open and to help you breathe well. If you have trouble with the mask, it is very important that you talk with your health care provider about finding a way to make the mask easier to tolerate. Do not stop using the mask. There could be a negative impact to your health if you stop using the mask. Follow instructions from your health care provider about when to use the machine. This information is not intended to replace advice given to you by your health care provider. Make sure you discuss any questions you have with your health care provider. Document Revised: 07/06/2021 Document Reviewed: 11/05/2020 Elsevier Patient Education  2023 [employer]  Steps to Quit Smoking Smoking tobacco is the leading cause of preventable death. It can affect almost every organ in the body. Smoking puts you and people around you at risk  for many serious, long-lasting (chronic) diseases. Quitting smoking can be hard, but it is one of the best things that you can do for your health. It is never too late to quit. Do not give up if you cannot quit the first time. Some people need to try many times to quit. Do your best to stick to your quit plan, and talk with your doctor if you have any questions or concerns. How do I get ready to quit? Pick a date to quit. Set a date within the next 2 weeks to give you time to prepare. Write down the reasons why you are quitting. Keep this list in places where you will see it often. Tell your family, friends, and co-workers that you are quitting. Their support is important. Talk with your doctor about the choices that may help you quit. Find out if your health insurance will pay for these treatments. Know the people, places, things, and activities that make you want to smoke (triggers). Avoid them. What first steps can  I take to quit smoking? Throw away all cigarettes at home, at work, and in your car. Throw away the things that you use when you smoke, such as ashtrays and lighters. Clean your car. Empty the ashtray. Clean your home, including curtains and carpets. What can I do to help me quit smoking? Talk with your doctor about taking medicines and seeing a counselor. You are more likely to succeed when you do both. If you are pregnant or breastfeeding: Talk with your doctor about counseling or other ways to quit smoking. Do not take medicine to help you quit smoking unless your doctor tells you to. Quit right away Quit smoking completely, instead of slowly cutting back on how much you smoke over a period of time. Stopping smoking right away may be more successful than slowly quitting. Go to counseling. In-person is best if this is an option. You are more likely to quit if you go to counseling sessions regularly. Take medicine You may take medicines to help you quit. Some medicines need a prescription, and some you can buy over-the-counter. Some medicines may contain a drug called nicotine to replace the nicotine in cigarettes. Medicines may: Help you stop having the desire to smoke (cravings). Help to stop the problems that come when you stop smoking (withdrawal symptoms). Your doctor may ask you to use: Nicotine patches, gum, or lozenges. Nicotine inhalers or sprays. Non-nicotine medicine that you take by mouth. Find resources Find resources and other ways to help you quit smoking and remain smoke-free after you quit. They include: Online chats with a Veterinary surgeon. Phone quitlines. Printed Materials engineer. Support groups or group counseling. Text messaging programs. Mobile phone apps. Use apps on your mobile phone or tablet that can help you stick to your quit plan. Examples of free services include Quit Guide from the CDC and smokefree.gov  What can I do to make it easier to quit?  Talk to  your family and friends. Ask them to support and encourage you. Call a phone quitline, such as 1-800-QUIT-NOW, reach out to support groups, or work with a Veterinary surgeon. Ask people who smoke to not smoke around you. Avoid places that make you want to smoke, such as: Bars. Parties. Smoke-break areas at work. Spend time with people who do not smoke. Lower the stress in your life. Stress can make you want to smoke. Try these things to lower stress: Getting regular exercise. Doing deep-breathing exercises. Doing yoga. Meditating. What benefits will  I see if I quit smoking? Over time, you may have: A better sense of smell and taste. Less coughing and sore throat. A slower heart rate. Lower blood pressure. Clearer skin. Better breathing. Fewer sick days. Summary Quitting smoking can be hard, but it is one of the best things that you can do for your health. Do not give up if you cannot quit the first time. Some people need to try many times to quit. When you decide to quit smoking, make a plan to help you succeed. Quit smoking right away, not slowly over a period of time. When you start quitting, get help and support to keep you smoke-free. This information is not intended to replace advice given to you by your health care provider. Make sure you discuss any questions you have with your health care provider. Document Revised: 11/18/2021 Document Reviewed: 11/18/2021 Elsevier Patient Education  2024 [employer].

## 2023-07-11 NOTE — Assessment & Plan Note (Addendum)
-   Following with lung cancer screening program. LDCT 03/19/23>> Mild centrilobular emphysema. Smoking related respiratory bronchiolitis. Calcified granulomas. 2.6 mm nodule along the minor fissure. No new pulmonary nodules. She does not have daily respiratory symptoms. No active dx of COPD. No PFTs on file. Smoking cessation encouraged

## 2023-07-20 NOTE — Progress Notes (Signed)
Reviewed and agree with assessment/plan.   Coralyn Helling, MD [facility] Pulmonary/Critical Care 07/20/2023, 3:45 PM Pager:  [phone]

## 2023-07-26 ENCOUNTER — Encounter (INDEPENDENT_AMBULATORY_CARE_PROVIDER_SITE_OTHER): Payer: Self-pay

## 2023-08-17 ENCOUNTER — Encounter: Payer: Self-pay | Admitting: Family Medicine

## 2023-08-17 ENCOUNTER — Ambulatory Visit (INDEPENDENT_AMBULATORY_CARE_PROVIDER_SITE_OTHER): Payer: 59 | Admitting: Family Medicine

## 2023-08-17 VITALS — BP 124/80 | HR 80 | Temp 98.2°F | Ht 64.75 in | Wt 218.2 lb

## 2023-08-17 DIAGNOSIS — Z7985 Long-term (current) use of injectable non-insulin antidiabetic drugs: Secondary | ICD-10-CM

## 2023-08-17 DIAGNOSIS — Z0001 Encounter for general adult medical examination with abnormal findings: Secondary | ICD-10-CM

## 2023-08-17 DIAGNOSIS — E1165 Type 2 diabetes mellitus with hyperglycemia: Secondary | ICD-10-CM | POA: Diagnosis not present

## 2023-08-17 DIAGNOSIS — F321 Major depressive disorder, single episode, moderate: Secondary | ICD-10-CM | POA: Diagnosis not present

## 2023-08-17 DIAGNOSIS — Z23 Encounter for immunization: Secondary | ICD-10-CM | POA: Diagnosis not present

## 2023-08-17 LAB — COMPREHENSIVE METABOLIC PANEL
ALT: 24 U/L (ref 0–35)
AST: 23 U/L (ref 0–37)
Albumin: 4.3 g/dL (ref 3.5–5.2)
Alkaline Phosphatase: 69 U/L (ref 39–117)
BUN: 13 mg/dL (ref 6–23)
CO2: 30 meq/L (ref 19–32)
Calcium: 9.6 mg/dL (ref 8.4–10.5)
Chloride: 104 meq/L (ref 96–112)
Creatinine, Ser: 0.82 mg/dL (ref 0.40–1.20)
GFR: 78.8 mL/min (ref >60.00)
Glucose, Bld: 89 mg/dL (ref 70–99)
Potassium: 4.2 meq/L (ref 3.5–5.1)
Sodium: 140 meq/L (ref 135–145)
Total Bilirubin: 0.4 mg/dL (ref 0.2–1.2)
Total Protein: 7.3 g/dL (ref 6.0–8.3)

## 2023-08-17 LAB — LIPID PANEL
Cholesterol: 214 mg/dL — ABNORMAL HIGH (ref 0–200)
HDL: 46.6 mg/dL (ref >39.00)
LDL Cholesterol: 126 mg/dL — ABNORMAL HIGH (ref 0–99)
NonHDL: 167.42
Total CHOL/HDL Ratio: 5
Triglycerides: 209 mg/dL — ABNORMAL HIGH (ref 0.0–149.0)
VLDL: 41.8 mg/dL — ABNORMAL HIGH (ref 0.0–40.0)

## 2023-08-17 LAB — POCT GLYCOSYLATED HEMOGLOBIN (HGB A1C): Hemoglobin A1C: 5.4 % (ref 4.0–5.6)

## 2023-08-17 LAB — MICROALBUMIN / CREATININE URINE RATIO
Creatinine,U: 30.3 mg/dL
Microalb Creat Ratio: 2.3 mg/g (ref 0.0–30.0)
Microalb, Ur: <0.7 mg/dL (ref 0.0–1.9)

## 2023-08-17 MED ORDER — DULOXETINE HCL 60 MG PO CPEP
60.0000 mg | ORAL_CAPSULE | Freq: Every day | ORAL | 3 refills | Status: DC
Start: 2023-08-17 — End: 2023-09-28

## 2023-08-17 MED ORDER — DULOXETINE HCL 30 MG PO CPEP
30.0000 mg | ORAL_CAPSULE | Freq: Every day | ORAL | 0 refills | Status: AC
Start: 2023-08-17 — End: 2023-08-31

## 2023-08-17 NOTE — Patient Instructions (Signed)

## 2023-08-17 NOTE — Progress Notes (Signed)
Complete physical exam  Patient: Lori [name]   DOB: 1966/11/14   57 y.o. Female  MRN: [id number]  Subjective:    Chief Complaint  Patient presents with   Annual Exam    Lori [name] is a 57 y.o. female who presents today for a complete physical exam. She reports consuming a general diet. Exercise is limited by orthopedic condition(s): chronic pain from arthritis/ possible fibromyalgia. She generally feels poorly. She reports sleeping fairly well. She does have additional problems to discuss today.   PHQ is scoring high today, pt reports depression symptoms related to her chronic pain issues. States that she is wearing her CPAP at night which helps with sleep but she is reporting anhedonia, worsening mood symptoms, crying spells, etc. We discussed medication that could help with both her mood and her chronic pain symptoms.   Most recent fall risk assessment:    04/21/2022    1:29 PM  Fall Risk   Falls in the past year? 0  Number falls in past yr: 0  Injury with Fall? 0  Risk for fall due to : No Fall Risks  Follow up Falls evaluation completed     Most recent depression screenings:    08/17/2023    7:58 AM 08/15/2022    2:59 PM  PHQ 2/9 Scores  PHQ - 2 Score 3 1  PHQ- 9 Score 15 7    Vision:Not within last year  and needs to make appt and Dental: No current dental problems and Receives regular dental care  Patient Active Problem List   Diagnosis Date Noted   Recurrent sinusitis 04/30/2023   Degenerative arthritis of knee, bilateral 10/20/2021   Degenerative disc disease, cervical 07/06/2021   Lumbar arthritis 07/06/2021   Somatic dysfunction of spine, cervical 05/19/2021   ADD (attention deficit disorder) 02/28/2021   Primary osteoarthritis of right hip 06/09/2020   Osteoporosis 05/17/2020   Closed fracture of right pubis (HCC) 04/22/2020   Tear of right acetabular labrum 04/22/2020   Chronic right hip pain 04/19/2020   Diabetes mellitus type II, controlled (HCC)  02/20/2020   Hyperlipidemia associated with type 2 diabetes mellitus (HCC) 02/20/2020   Hypertension 02/03/2020   Hepatic steatosis 02/03/2020   Morbid obesity (HCC) 02/07/2018   S/P TAH-BSO (total abdominal hysterectomy and bilateral salpingo-oophorectomy) 10/17/2016   OSA (obstructive sleep apnea) 08/10/2016   HIATAL HERNIA 05/27/2010   Vitamin D deficiency 05/23/2010   Allergic rhinitis 03/14/2010   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   Backache 01/24/2010   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   TOBACCO USE 12/01/2008   GERD 12/01/2008   Osteoarthritis 12/01/2008      Patient Care Team: Karie Georges, MD as PCP - General (Family Medicine) Sedalia Muta, PT as Physical Therapist (Physical Therapy)   Outpatient Medications Prior to Visit  Medication Sig   Brimonidine Tartrate (LUMIFY) 0.025 % SOLN Place 1 drop into both eyes in the morning and at bedtime.   cetirizine (ZYRTEC) 10 MG tablet Take 10 mg by mouth daily as needed for allergies.   cyclobenzaprine (FLEXERIL) 10 MG tablet Take 1 tablet (10 mg total) by mouth at bedtime.   diclofenac Sodium (VOLTAREN) 1 % GEL Apply 4 g topically 4 (four) times daily.   fluticasone (FLONASE) 50 MCG/ACT nasal spray Place 2 sprays into both nostrils daily as needed for allergies.   HYDROcodone-acetaminophen (NORCO) 10-325 MG tablet Take 1 tablet by mouth every 6 (six) hours as needed for severe pain.   lidocaine (  LIDODERM) 5 % Place 1 patch onto the skin daily. Remove & Discard patch within 12 hours or as directed by MD (back/hip pain)   pseudoephedrine (SUDAFED) 30 MG tablet Take 30-60 mg by mouth every 4 (four) hours as needed (headaches).   tirzepatide (MOUNJARO) 2.5 MG/0.5ML Pen INJECT 2.5 MG SUBCUTANEOUSLY WEEKLY    Review of Systems  Constitutional:  Positive for malaise/fatigue.  HENT:  Negative for hearing loss.   Eyes:  Negative for blurred vision.  Respiratory:  Negative for shortness of breath.    Cardiovascular:  Negative for chest pain.  Gastrointestinal: Negative.   Genitourinary: Negative.   Musculoskeletal:  Positive for back pain and joint pain (chronic).  Neurological:  Negative for headaches.  Psychiatric/Behavioral:  Positive for depression. Negative for suicidal ideas.   All other systems reviewed and are negative.      Objective:     BP 124/80 (BP Location: Left Arm, Patient Position: Sitting, Cuff Size: Large)   Pulse 80   Temp 98.2 F (36.8 C) (Oral)   Ht 5' 4.75" (1.645 m)   Wt 218 lb 3.2 oz (99 kg)   LMP 09/27/2016 (Approximate)   SpO2 95%   BMI 36.59 kg/m    Physical Exam Vitals reviewed.  Constitutional:      Appearance: Normal appearance. She is well-groomed. She is obese.  HENT:     Right Ear: Tympanic membrane and ear canal normal.     Left Ear: Tympanic membrane and ear canal normal.     Mouth/Throat:     Mouth: Mucous membranes are moist.     Pharynx: No posterior oropharyngeal erythema.  Eyes:     Conjunctiva/sclera: Conjunctivae normal.  Neck:     Thyroid: No thyromegaly.  Cardiovascular:     Rate and Rhythm: Normal rate and regular rhythm.     Pulses: Normal pulses.     Heart sounds: S1 normal and S2 normal.  Pulmonary:     Effort: Pulmonary effort is normal.     Breath sounds: Normal breath sounds and air entry.  Abdominal:     General: Bowel sounds are normal.     Palpations: Abdomen is soft.  Musculoskeletal:     Right lower leg: No edema.     Left lower leg: No edema.  Lymphadenopathy:     Cervical: No cervical adenopathy.  Neurological:     Mental Status: She is alert and oriented to person, place, and time. Mental status is at baseline.     Gait: Gait is intact.  Psychiatric:        Mood and Affect: Mood is depressed. Affect is tearful.        Speech: Speech normal.        Behavior: Behavior normal.        Judgment: Judgment normal.         Assessment & Plan:    Routine Health Maintenance and Physical  Exam  Immunization History  Administered Date(s) Administered   Moderna Sars-Covid-2 Vaccination 03/11/2020, 04/10/2020   PNEUMOCOCCAL CONJUGATE-20 02/28/2021   Tdap 12/23/2013   Zoster Recombinant(Shingrix) 02/28/2021, 08/17/2023    Health Maintenance  Topic Date Due   OPHTHALMOLOGY EXAM  07/16/2023   COVID-19 Vaccine (3 - 2023-24 season) 09/02/2023 (Originally 08/12/2023)   INFLUENZA VACCINE  03/10/2024 (Originally 07/12/2023)   DTaP/Tdap/Td (2 - Td or Tdap) 12/24/2023   HEMOGLOBIN A1C  02/14/2024   PAP SMEAR-Modifier  02/29/2024   Lung Cancer Screening  03/18/2024   MAMMOGRAM  05/22/2024   Diabetic  kidney evaluation - eGFR measurement  08/16/2024   Diabetic kidney evaluation - Urine ACR  08/16/2024   FOOT EXAM  08/16/2024   Colonoscopy  04/21/2029   Hepatitis C Screening  Completed   HIV Screening  Completed   Zoster Vaccines- Shingrix  Completed   HPV VACCINES  Aged Out    Discussed health benefits of physical activity, and encouraged her to engage in regular exercise appropriate for her age and condition.  Controlled type 2 diabetes mellitus with hyperglycemia, without long-term current use of insulin (HCC) -     POCT glycosylated hemoglobin (Hb A1C) -     Comprehensive metabolic panel -     Lipid panel -     Microalbumin / creatinine urine ratio  Depression, major, single episode, moderate (HCC) -     DULoxetine HCl; Take 1 capsule (30 mg total) by mouth daily for 14 days.  Dispense: 14 capsule; Refill: 0 -     DULoxetine HCl; Take 1 capsule (60 mg total) by mouth daily.  Dispense: 30 capsule; Refill: 3  Immunization due -     Varicella-zoster vaccine IM  Encounter for routine adult health examination with abnormal findings  Normal physical exam findings except for depression screening is positive. We had a long discussion about medications and she is agreeable to starting cymbalta, will start with 30 mg daily for 14 days then increase to 60 mg daily. I will follow up  with her in 6 weeks to reassess her symptoms.   Return in about 6 weeks (around 09/28/2023) for video visit for follow up on mood.     Karie Georges, MD

## 2023-09-08 ENCOUNTER — Other Ambulatory Visit: Payer: Self-pay | Admitting: Family Medicine

## 2023-09-08 DIAGNOSIS — F321 Major depressive disorder, single episode, moderate: Secondary | ICD-10-CM

## 2023-09-10 ENCOUNTER — Encounter: Payer: Self-pay | Admitting: Internal Medicine

## 2023-09-10 ENCOUNTER — Ambulatory Visit: Payer: 59 | Admitting: Internal Medicine

## 2023-09-10 VITALS — BP 150/82 | HR 94 | Temp 98.6°F | Resp 16 | Ht 64.37 in | Wt 217.4 lb

## 2023-09-10 DIAGNOSIS — J3089 Other allergic rhinitis: Secondary | ICD-10-CM | POA: Diagnosis not present

## 2023-09-10 MED ORDER — FLUTICASONE PROPIONATE 50 MCG/ACT NA SUSP: 1.0000 | Freq: Every day | NASAL | 5 refills | Status: DC

## 2023-09-10 NOTE — Progress Notes (Signed)
NEW PATIENT  Date of Service/Encounter:  09/10/23  Consult requested by: Karie Georges, MD   Subjective:   Lori [name] (DOB: [birth year]) is a 57 y.o. female who presents to the clinic on 09/10/2023 with a chief complaint of Allergic Rhinitis  (Says Dr. Robyne Askew from ENT sent her over for allergies. ) .    History obtained from: chart review and patient.  Discussed the use of AI scribe software for clinical note transcription with the patient, who gave verbal consent to proceed.  History of Present Illness   The patient was referred to ENT due to persistent facial puffiness, red eyes, and a stuffy nose. She reports that these symptoms improve when taking antibiotics for unrelated conditions, such as UTIs, but is reluctant to rely on antibiotics for relief. The patient has been using Zyrtec and Flonase to manage what has been assumed to be allergies, but these medications have not been used in the past two to three weeks.  The patient denies experiencing a runny nose, instead describing it as dry and stuffy. She also reports facial pain and pressure, particularly around the eyes, which are often puffy and watery, especially in the mornings. Sneezing is reported, but not excessively, and seems to be more prevalent in the spring and fall seasons.  In addition to the aforementioned symptoms, the patient has been dealing with dry eyes for a long time, which she attributes to her pain medication. . The patient acknowledges that she does not drink enough water, which could potentially contribute to her dryness symptoms.         Past Medical History: Past Medical History:  Diagnosis Date   ALLERGIC RHINITIS 03/14/2010   BACK PAIN 01/24/2010   Buttock pain    Constipation    Depression    Diabetes (HCC)    Fatty liver    GERD 12/01/2008   HBP (high blood pressure)    HIATAL HERNIA 05/27/2010   Hot flashes    Hyperlipidemia    Hypertrophy of tongue papillae 03/14/2010   IBS  (irritable bowel syndrome)    Joint pain    Numbness and tingling of left upper and lower extremity    OSTEOARTHRITIS 12/01/2008   Osteoporosis 05/17/2020   Fragility fracture plus T score -1.8 on DEXA scan June 2021   Pneumonia    Sciatica, left side    Sleep apnea    Sleep apnea    Thigh pain    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   TOBACCO USE 12/01/2008   VITAMIN D DEFICIENCY 05/23/2010     Past Surgical History: Past Surgical History:  Procedure Laterality Date   ABDOMINAL HYSTERECTOMY N/A 10/17/2016   Procedure: HYSTERECTOMY ABDOMINAL;  Surgeon: Essie Hart, MD;  Location: [facility]; benign mass in uterus   APPENDECTOMY     BREAST BIOPSY Right    BREAST EXCISIONAL BIOPSY Left    BREAST SURGERY     lumpectomy   CARPAL TUNNEL RELEASE     CERVICAL FUSION     x2; 2007, 2011   CESAREAN SECTION  1988   COLONOSCOPY     CYSTOSCOPY N/A 10/17/2016   Procedure: CYSTOSCOPY;  Surgeon: Essie Hart, MD;  Location: [facility];  Service: Gynecology;  Laterality: N/A;   DILATION AND CURETTAGE OF UTERUS     miscarriage   ESOPHAGEAL MANOMETRY     ESOPHAGOGASTRODUODENOSCOPY     GASTRIC FUNDOPLICATION     Nissen   HERNIA REPAIR     LAPAROSCOPIC NISSEN FUNDOPLICATION  LUMBAR DISC SURGERY     LUMBAR LAMINECTOMY  2005   SALPINGOOPHORECTOMY Bilateral 10/17/2016   Procedure: SALPINGO OOPHORECTOMY;  Surgeon: Essie Hart, MD;  Location: [facility];  Service: Gynecology;  Laterality: Bilateral;   TONSILLECTOMY AND ADENOIDECTOMY     TOTAL HIP ARTHROPLASTY Right 05/30/2022   Procedure: RIGHT TOTAL HIP ARTHROPLASTY ANTERIOR APPROACH;  Surgeon: Marcene Corning, MD;  Location: [facility];  Service: Orthopedics;  Laterality: Right;   UPPER GASTROINTESTINAL ENDOSCOPY      Family History: Family History  Problem Relation Age of Onset   Breast cancer Mother 68   High blood pressure Mother    High Cholesterol Mother    Diabetes Mother    Diverticulitis Mother 82   Heart attack Mother 59       during hospitalization  with diverticulitis   Heart disease Mother    Obesity Mother    Alzheimer's disease Father    Alcohol abuse Father    Sleep apnea Father    Colon polyps Half-Sister    Colonic polyp Half-Brother    Esophageal cancer Neg Hx    Colon cancer Neg Hx    Rectal cancer Neg Hx    Stomach cancer Neg Hx      Medication List:  Allergies as of 09/10/2023       Reactions   Amoxicillin Nausea And Vomiting, Other (See Comments)   Yeast Infection   Egg-derived Products Nausea And Vomiting   With scrambled or cooked eggs (can eat if baked into something i.e. cakes)   Latex Rash   Morphine Anxiety   REACTION: hyper REACTION: hyper   Torecan [thiethylperazine] Palpitations        Medication List        Accurate as of September 10, 2023  2:21 PM. If you have any questions, ask your nurse or doctor.          cetirizine 10 MG tablet Commonly known as: ZYRTEC Take 10 mg by mouth daily as needed for allergies.   cyclobenzaprine 10 MG tablet Commonly known as: FLEXERIL Take 1 tablet (10 mg total) by mouth at bedtime.   diclofenac Sodium 1 % Gel Commonly known as: VOLTAREN Apply 4 g topically 4 (four) times daily.   DULoxetine 60 MG capsule Commonly known as: Cymbalta Take 1 capsule (60 mg total) by mouth daily. What changed: Another medication with the same name was removed. Continue taking this medication, and follow the directions you see here. Changed by: Birder Robson   fluticasone 50 MCG/ACT nasal spray Commonly known as: FLONASE Place 2 sprays into both nostrils daily as needed for allergies.   HYDROcodone-acetaminophen 10-325 MG tablet Commonly known as: NORCO Take 1 tablet by mouth every 6 (six) hours as needed for severe pain.   lidocaine 5 % Commonly known as: LIDODERM Place 1 patch onto the skin daily. Remove & Discard patch within 12 hours or as directed by MD (back/hip pain)   Lumify 0.025 % Soln Generic drug: Brimonidine Tartrate Place 1 drop into both  eyes in the morning and at bedtime.   Mounjaro 2.5 MG/0.5ML Pen Generic drug: tirzepatide INJECT 2.5 MG SUBCUTANEOUSLY WEEKLY   nitrofurantoin (macrocrystal-monohydrate) 100 MG capsule Commonly known as: MACROBID Take 100 mg by mouth 2 (two) times daily.   pseudoephedrine 30 MG tablet Commonly known as: SUDAFED Take 30-60 mg by mouth every 4 (four) hours as needed (headaches).         REVIEW OF SYSTEMS: Pertinent positives and negatives discussed in HPI.  Objective:   Physical Exam: BP (!) 150/82   Pulse 94   Temp 98.6 F (37 C) (Temporal)   Resp 16   Ht 5' 4.37" (1.635 m)   Wt 217 lb 6.4 oz (98.6 kg)   LMP 09/27/2016 (Approximate)   SpO2 96%   BMI 36.89 kg/m  Body mass index is 36.89 kg/m. GEN: alert, well developed HEENT: clear conjunctiva, TM grey and translucent, nose with + inferior turbinate hypertrophy, pink nasal mucosa, slight clear rhinorrhea, + cobblestoning HEART: regular rate and rhythm, no murmur LUNGS: clear to auscultation bilaterally, no coughing, unlabored respiration ABDOMEN: soft, non distended  SKIN: no rashes or lesions  Reviewed:  07/12/2023: seen by [facility] Clovis Cao PA for chronic rhinitis, deviated nasal septum. Had mucopurulent discharge on endoscopy. Started on clindamycin and mupirocin ointment.  Discussed avoiding Flonase/Afrin and instead using saline spray.    04/16/2023: seen by Clent Ridges NP at [facility] for OSA.  Previously on CPAP but stopped due to mask issues.  Discussed repeat sleepy study and new CPAP.    06/15/2023: seen by Onc for leukocytosis.  Flow showed predominant T cells, nonspecific. No monoclonal B cells.  Plan to observe.    Assessment:   1. Other allergic rhinitis     Plan/Recommendations:  Other Allergic Rhinitis: - Due to turbinate hypertrophy, seasonal symptoms and unresponsive to over the counter meds, performed skin testing to identify aeroallergen triggers.   - Use nasal saline rinses before nose sprays  such as with Neilmed Sinus Rinse.  Use distilled water.   - Use Flonase 1-2 sprays each nostril daily. Aim upward and outward. - Hold all anti histamines (Zyrtec, Benadryl, Claritin, Allegra, Xyzal) 3 days prior to next visit.    Follow up: overbook with me at 2:30 PM next Monday for skin testing.      Return in about 1 week (around 09/17/2023).  Alesia Morin, MD [facility]

## 2023-09-10 NOTE — Patient Instructions (Addendum)
Other Allergic Rhinitis: - Use nasal saline rinses before nose sprays such as with Neilmed Sinus Rinse.  Use distilled water.   - Use Flonase 1-2 sprays each nostril daily. Aim upward and outward. - Hold all anti histamines (Zyrtec, Benadryl, Claritin, Allegra, Xyzal) 3 days prior to next visit.    Follow up: overbook with me at 2:30 PM next Monday for skin testing.

## 2023-09-17 ENCOUNTER — Other Ambulatory Visit: Payer: Self-pay

## 2023-09-17 ENCOUNTER — Ambulatory Visit: Payer: 59 | Admitting: Internal Medicine

## 2023-09-17 VITALS — BP 116/72 | HR 89 | Temp 99.1°F | Resp 16 | Ht 64.0 in | Wt 217.0 lb

## 2023-09-17 DIAGNOSIS — J301 Allergic rhinitis due to pollen: Secondary | ICD-10-CM | POA: Diagnosis not present

## 2023-09-17 DIAGNOSIS — J3081 Allergic rhinitis due to animal (cat) (dog) hair and dander: Secondary | ICD-10-CM

## 2023-09-17 DIAGNOSIS — J3089 Other allergic rhinitis: Secondary | ICD-10-CM | POA: Diagnosis not present

## 2023-09-17 MED ORDER — AZELASTINE HCL 0.1 % NA SOLN: 2.0000 | Freq: Two times a day (BID) | NASAL | 5 refills | Status: DC | PRN

## 2023-09-17 MED ORDER — CETIRIZINE HCL 10 MG PO TABS: 10.0000 mg | ORAL_TABLET | Freq: Every day | ORAL | 5 refills | Status: DC

## 2023-09-17 NOTE — Patient Instructions (Addendum)
Allergic Rhinitis: - Positive skin test 09/2023: grasses, weeds, trees, molds, dust mites, cats, dogs.  - Avoidance measures discussed. - Use nasal saline rinses before nose sprays such as with Neilmed Sinus Rinse.  Use distilled water.   - Use Flonase 2 sprays each nostril daily. Aim upward and outward. - Use Azelastine 1-2 sprays each nostril twice daily as needed for runny nose, drainage, sneezing, congestion. Aim upward and outward. - Use Zyrtec 10 mg daily.  - Consider allergy shots as long term control of your symptoms by teaching your immune system to be more tolerant of your allergy triggers   ALLERGEN AVOIDANCE MEASURES   Dust Mites Use central air conditioning and heat; and change the filter monthly.  Pleated filters work better than mesh filters.  Electrostatic filters may also be used; wash the filter monthly.  Window air conditioners may be used, but do not clean the air as well as a central air conditioner.  Change or wash the filter monthly. Keep windows closed.  Do not use attic fans.   Encase the mattress, box springs and pillows with zippered, dust proof covers. Wash the bed linens in hot water weekly.   Remove carpet, especially from the bedroom. Remove stuffed animals, throw pillows, dust ruffles, heavy drapes and other items that collect dust from the bedroom. Do not use a humidifier.   Use wood, vinyl or leather furniture instead of cloth furniture in the bedroom. Keep the indoor humidity at 30 - 40%.  Monitor with a humidity gauge.  Molds - Indoor avoidance Use air conditioning to reduce indoor humidity.  Do not use a humidifier. Keep indoor humidity at 30 - 40%.  Use a dehumidifier if needed. In the bathroom use an exhaust fan or open a window after showering.  Wipe down damp surfaces after showering.  Clean bathrooms with a mold-killing solution (diluted bleach, or products like Tilex, etc) at least once a month. In the kitchen use an exhaust fan to remove steam  from cooking.  Throw away spoiled foods immediately, and empty garbage daily.  Empty water pans below self-defrosting refrigerators frequently. Vent the clothes dryer to the outside. Limit indoor houseplants; mold grows in the dirt.  No houseplants in the bedroom. Remove carpet from the bedroom. Encase the mattress and box springs with a zippered encasing.  Molds - Outdoor avoidance Avoid being outside when the grass is being mowed, or the ground is tilled. Avoid playing in leaves, pine straw, hay, etc.  Dead plant materials contain mold. Avoid going into barns or grain storage areas. Remove leaves, clippings and compost from around the home.  Pollen Avoidance Pollen levels are highest during the mid-day and afternoon.  Consider this when planning outdoor activities. Avoid being outside when the grass is being mowed, or wear a mask if the pollen-allergic person must be the one to mow the grass. Keep the windows closed to keep pollen outside of the home. Use an air conditioner to filter the air. Take a shower, wash hair, and change clothing after working or playing outdoors during pollen season. Pet Dander Keep the pet out of your bedroom and restrict it to only a few rooms. Be advised that keeping the pet in only one room will not limit the allergens to that room. Don't pet, hug or kiss the pet; if you do, wash your hands with soap and water. High-efficiency particulate air (HEPA) cleaners run continuously in a bedroom or living room can reduce allergen levels over time. Regular use  of a high-efficiency vacuum cleaner or a central vacuum can reduce allergen levels. Giving your pet a bath at least once a week can reduce airborne allergen.

## 2023-09-17 NOTE — Progress Notes (Signed)
FOLLOW UP Date of Service/Encounter:  09/17/23   Subjective:  Lori [name] (DOB: 11/07/1966) is a 57 y.o. female who [facility] for follow up for skin testing.   History obtained from: chart review and patient.  Anti histamines held.   Past Medical History: Past Medical History:  Diagnosis Date   ALLERGIC RHINITIS 03/14/2010   BACK PAIN 01/24/2010   Buttock pain    Constipation    Depression    Diabetes (HCC)    Fatty liver    GERD 12/01/2008   HBP (high blood pressure)    HIATAL HERNIA 05/27/2010   Hot flashes    Hyperlipidemia    Hypertrophy of tongue papillae 03/14/2010   IBS (irritable bowel syndrome)    Joint pain    Numbness and tingling of left upper and lower extremity    OSTEOARTHRITIS 12/01/2008   Osteoporosis 05/17/2020   Fragility fracture plus T score -1.8 on DEXA scan June 2021   Pneumonia    Sciatica, left side    Sleep apnea    Sleep apnea    Thigh pain    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   TOBACCO USE 12/01/2008   VITAMIN D DEFICIENCY 05/23/2010    Objective:  BP 116/72   Pulse 89   Temp 99.1 F (37.3 C) (Temporal)   Resp 16   Ht 5\' 4"  (1.626 m)   Wt 217 lb (98.4 kg)   LMP 09/27/2016 (Approximate)   SpO2 96%   BMI 37.25 kg/m  Body mass index is 37.25 kg/m. Physical Exam: GEN: alert, well developed HEENT: clear conjunctiva, MMM HEART: regular rate LUNGS: no coughing, unlabored respiration SKIN: no rashes or lesions  Skin Testing:  Skin prick testing was placed, which includes aeroallergens/foods, histamine control, and saline control.  Verbal consent was obtained prior to placing test.  Patient tolerated procedure well.  Allergy testing results were read and interpreted by myself, documented by clinical staff. Adequate positive and negative control.  Positive results to:  Results discussed with patient/family.  Airborne Adult Perc - 09/17/23 1357     Time Antigen Placed 1357    Allergen  Manufacturer Waynette Buttery    Location Back    Number of Test 55    1. Control-Buffer 50% Glycerol Negative    2. Control-Histamine 3+    3. Bahia Negative    4. French Southern Territories Negative    5. Johnson Negative    6. Kentucky Blue Negative    7. Meadow Fescue 2+    8. Perennial Rye 2+    9. Timothy 2+    10. Ragweed Mix Negative    11. Cocklebur 2+    12. Plantain,  English 2+    13. Baccharis 3+    14. Dog Fennel Negative    15. Russian Thistle 3+    16. Lamb's Quarters 3+    17. Sheep Sorrell Negative    18. Rough Pigweed Negative    19. Marsh Elder, Rough 3+    20. Mugwort, Common 3+    21. Box, Elder Negative    22. Cedar, red Negative    23. Sweet Gum Negative    24. Pecan Pollen Negative    25. Pine Mix Negative    26. Walnut, Black Pollen Negative    27. Red Mulberry Negative    28. Ash Mix Negative    29. Birch Mix Negative    30. Beech American Negative    31. Cottonwood, Guinea-Bissau  Negative    32. Hickory, White Negative    33. Maple Mix Negative    34. Oak, Guinea-[place]issau Mix Negative    35. Sycamore Eastern Negative    36. Alternaria Alternata Negative    37. Cladosporium Herbarum Negative    38. Aspergillus Mix Negative    39. Penicillium Mix Negative    40. Bipolaris Sorokiniana (Helminthosporium) Negative    41. Drechslera Spicifera (Curvularia) Negative    42. Mucor Plumbeus Negative    43. Fusarium Moniliforme Negative    44. Aureobasidium Pullulans (pullulara) Negative    45. Rhizopus Oryzae Negative    46. Botrytis Cinera Negative    47. Epicoccum Nigrum Negative    48. Phoma Betae Negative    49. Dust Mite Mix Negative    50. Cat Hair 10,000 BAU/ml Negative    51.  Dog Epithelia Negative    52. Mixed Feathers Negative    53. Horse Epithelia Negative    54. Cockroach, German Negative    55. Tobacco Leaf Negative             Intradermal - 09/17/23 1441     Time Antigen Placed 1441    Allergen Manufacturer Waynette Buttery    Location Arm    Number of Test 14     Control Negative    Bahia 2+    French Southern Territ[place] 2+    Johnson 2+    Ragweed Mix 2+    Tree Mix 3+    Mold 1 3+    Mold 2 Negative    Mold 3 3+    Mold 4 Negative    Mite Mix 2+    Cat 2+    Dog 2+    Cockroach Negative              Assessment:   1. Seasonal allergic rhinitis due to pollen   2. Allergic rhinitis caused by mold   3. Allergic rhinitis due to animal hair or dander   4. Allergic rhinitis due to dust mite     Plan/Recommendations:  Allergic Rhinitis: - Due to turbinate hypertrophy, seasonal symptoms and unresponsive to over the counter meds, will perform skin testing to identify aeroallergen triggers.   - Positive skin test 09/2023: grasses, weeds, trees, molds, dust mites, cats, dogs.  - Avoidance measures discussed. - Use nasal saline rinses before nose sprays such as with Neilmed Sinus Rinse.  Use distilled water.   - Use Flonase 2 sprays each nostril daily. Aim upward and outward. - Use Azelastine 1-2 sprays each nostril twice daily as needed for runny nose, drainage, sneezing, congestion. Aim upward and outward. - Use Zyrtec 10 mg daily.  - Consider allergy shots as long term control of your symptoms by teaching your immune system to be more tolerant of your allergy triggers     Return in about 6 weeks (around 10/29/2023).  Alesia Morin, MD [facility]

## 2023-09-28 ENCOUNTER — Telehealth: Payer: 59 | Admitting: Family Medicine

## 2023-09-28 ENCOUNTER — Encounter: Payer: Self-pay | Admitting: Family Medicine

## 2023-09-28 DIAGNOSIS — F321 Major depressive disorder, single episode, moderate: Secondary | ICD-10-CM

## 2023-09-28 MED ORDER — DULOXETINE HCL 60 MG PO CPEP
60.0000 mg | ORAL_CAPSULE | Freq: Every day | ORAL | 1 refills | Status: DC
Start: 2023-09-28 — End: 2024-02-26

## 2023-09-28 NOTE — Progress Notes (Signed)
Pt lives in [place] so I am unable to bill an official visit today. I did reach out to her via telephone today to follow up on her symptoms.  She reports improvement in her depression and pain symptoms on the 60 mg duloxetine. Still having some pain but it appears to be better. She denies any side effects to the medication. We discussed that the longer she is on the medication the more efficacious it will be. No SI or HI today. Continue duloxetine 60 mg daily, 90 day supply sent to her pharmacy.

## 2023-11-13 ENCOUNTER — Other Ambulatory Visit: Payer: Self-pay | Admitting: Family Medicine

## 2023-11-13 DIAGNOSIS — E1165 Type 2 diabetes mellitus with hyperglycemia: Secondary | ICD-10-CM

## 2023-11-26 ENCOUNTER — Ambulatory Visit: Payer: 59 | Admitting: Internal Medicine

## 2023-11-26 ENCOUNTER — Encounter: Payer: Self-pay | Admitting: Internal Medicine

## 2023-11-26 VITALS — BP 122/76 | HR 111 | Temp 98.1°F | Resp 18 | Wt 228.5 lb

## 2023-11-26 DIAGNOSIS — J3089 Other allergic rhinitis: Secondary | ICD-10-CM | POA: Diagnosis not present

## 2023-11-26 DIAGNOSIS — J302 Other seasonal allergic rhinitis: Secondary | ICD-10-CM

## 2023-11-26 MED ORDER — FLUTICASONE PROPIONATE 50 MCG/ACT NA SUSP: 2.0000 | Freq: Every day | NASAL | 5 refills | Status: DC

## 2023-11-26 MED ORDER — AZELASTINE HCL 0.1 % NA SOLN: 2.0000 | Freq: Two times a day (BID) | NASAL | 5 refills | Status: DC | PRN

## 2023-11-26 MED ORDER — CETIRIZINE HCL 10 MG PO TABS: 10.0000 mg | ORAL_TABLET | Freq: Every day | ORAL | 5 refills | Status: DC

## 2023-11-26 NOTE — Progress Notes (Signed)
   FOLLOW UP Date of Service/Encounter:  11/26/23   Subjective:  Lori [name] (DOB: [birth year]) is a 57 y.o. female who [facility] for follow up for allergic rhinitis.   History obtained from: chart review and patient. Last visit was with me on 09/17/2023 and at the time was SPT positive to multiple allergens. Started on Flonase, Azelastine, Zyrtec.   Has noted some improvement in congestion, runny nose but still having symptoms.  Using Flonase/Azelastine PRN.  Also on Zyrtec daily; had to switch to nighttime due to sleepiness and now noted some weird dreams but she likes how much it is helping and does not wish to change/stop it.  Past Medical History: Past Medical History:  Diagnosis Date   ALLERGIC RHINITIS 03/14/2010   BACK PAIN 01/24/2010   Buttock pain    Constipation    Depression    Diabetes (HCC)    Fatty liver    GERD 12/01/2008   HBP (high blood pressure)    HIATAL HERNIA 05/27/2010   Hot flashes    Hyperlipidemia    Hypertrophy of tongue papillae 03/14/2010   IBS (irritable bowel syndrome)    Joint pain    Numbness and tingling of left upper and lower extremity    OSTEOARTHRITIS 12/01/2008   Osteoporosis 05/17/2020   Fragility fracture plus T score -1.8 on DEXA scan June 2021   Pneumonia    Sciatica, left side    Sleep apnea    Sleep apnea    Thigh pain    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   TOBACCO USE 12/01/2008   VITAMIN D DEFICIENCY 05/23/2010    Objective:  BP 122/76   Pulse (!) 111   Temp 98.1 F (36.7 C)   Resp 18   Wt 228 lb 8 oz (103.6 kg)   LMP 09/27/2016 (Approximate)   SpO2 95%   BMI 39.22 kg/m  Body mass index is 39.22 kg/m. Physical Exam: GEN: alert, well developed HEENT: clear conjunctiva, nose with mild inferior turbinate hypertrophy, pink nasal mucosa, + clear rhinorrhea, + cobblestoning HEART: regular rate and rhythm, no murmur LUNGS: clear to auscultation bilaterally, no coughing, unlabored  respiration SKIN: no rashes or lesions  Assessment:   1. Seasonal and perennial allergic rhinitis     Plan/Recommendations:   Allergic Rhinitis: - Uncontrolled, discussed daily use of Flonase/Zyrtec with PRN Azelastine and to consider AIT.   - Positive skin test 09/2023: grasses, weeds, trees, molds, dust mites, cats, dogs.  - Avoidance measures discussed. - Use nasal saline rinses before nose sprays such as with Neilmed Sinus Rinse.  Use distilled water.   - Use Flonase 2 sprays each nostril daily. Aim upward and outward. - Use Azelastine 1-2 sprays each nostril twice daily as needed for runny nose, drainage, sneezing, congestion. Aim upward and outward. - Use Zyrtec 10 mg daily.  - Consider allergy shots as long term control of your symptoms by teaching your immune system to be more tolerant of your allergy triggers. Discuss with insurance company and if interested in starting shots, call us back and we can get the vials started and would send in Epipen.  Risks, benefits, alternatives discussed.      Return in about 4 months (around 03/26/2024).  Alesia Morin, MD [facility]

## 2023-11-26 NOTE — Patient Instructions (Addendum)
Allergic Rhinitis: - Positive skin test 09/2023: grasses, weeds, trees, molds, dust mites, cats, dogs.  - Avoidance measures discussed. - Use nasal saline rinses before nose sprays such as with Neilmed Sinus Rinse.  Use distilled water.   - Use Flonase 2 sprays each nostril daily. Aim upward and outward. - Use Azelastine 1-2 sprays each nostril twice daily as needed for runny nose, drainage, sneezing, congestion. Aim upward and outward. - Use Zyrtec 10 mg daily.  - Consider allergy shots as long term control of your symptoms by teaching your immune system to be more tolerant of your allergy triggers. Discussed with insurance company and if interested in starting shots, call us back and we can get the vials started and would send in Epipen.

## 2024-01-03 ENCOUNTER — Other Ambulatory Visit: Payer: Self-pay | Admitting: *Deleted

## 2024-01-03 DIAGNOSIS — Z122 Encounter for screening for malignant neoplasm of respiratory organs: Secondary | ICD-10-CM

## 2024-01-03 DIAGNOSIS — F17210 Nicotine dependence, cigarettes, uncomplicated: Secondary | ICD-10-CM

## 2024-01-03 DIAGNOSIS — Z87891 Personal history of nicotine dependence: Secondary | ICD-10-CM

## 2024-02-26 ENCOUNTER — Ambulatory Visit (INDEPENDENT_AMBULATORY_CARE_PROVIDER_SITE_OTHER): Admitting: Family Medicine

## 2024-02-26 VITALS — BP 108/68 | HR 100 | Temp 98.2°F | Ht 64.0 in | Wt 225.7 lb

## 2024-02-26 DIAGNOSIS — Z7985 Long-term (current) use of injectable non-insulin antidiabetic drugs: Secondary | ICD-10-CM

## 2024-02-26 DIAGNOSIS — H66001 Acute suppurative otitis media without spontaneous rupture of ear drum, right ear: Secondary | ICD-10-CM | POA: Diagnosis not present

## 2024-02-26 DIAGNOSIS — E119 Type 2 diabetes mellitus without complications: Secondary | ICD-10-CM

## 2024-02-26 DIAGNOSIS — J209 Acute bronchitis, unspecified: Secondary | ICD-10-CM | POA: Diagnosis not present

## 2024-02-26 DIAGNOSIS — F321 Major depressive disorder, single episode, moderate: Secondary | ICD-10-CM

## 2024-02-26 LAB — POCT GLYCOSYLATED HEMOGLOBIN (HGB A1C): Hemoglobin A1C: 5.7 % — AB (ref 4.0–5.6)

## 2024-02-26 MED ORDER — DULOXETINE HCL 60 MG PO CPEP: 60.0000 mg | ORAL_CAPSULE | Freq: Every day | ORAL | 1 refills | Status: DC

## 2024-02-26 MED ORDER — DOXYCYCLINE HYCLATE 100 MG PO TABS: 100.0000 mg | ORAL_TABLET | Freq: Two times a day (BID) | ORAL | 0 refills | Status: AC

## 2024-02-26 MED ORDER — ALBUTEROL SULFATE HFA 108 (90 BASE) MCG/ACT IN AERS: 2.0000 | INHALATION_SPRAY | Freq: Four times a day (QID) | RESPIRATORY_TRACT | 0 refills | Status: DC | PRN

## 2024-02-26 MED ORDER — METHYLPREDNISOLONE 4 MG PO TBPK: ORAL_TABLET | ORAL | 0 refills | Status: DC

## 2024-02-26 NOTE — Progress Notes (Signed)
 Established Patient Office Visit  Subjective   Patient ID: Lori [name], female    DOB: 26-Feb-1966  Age: 58 y.o. MRN: [id number]  Chief Complaint  Patient presents with   Cough    Non-productive x1 week, tried Nyquil, Sudafed with no relief   Hand Pain    Patient complains of bilateral hand pain and right index finger pain x1 month    Pt reports 1 week history of sinus congestion, coughing, some subjective fever, ear pain BL, headaches, states that she might have had sick contacts at work. Pt is a smoker, has a history of chronic sinusitis and OSA, obesity. Has been using her antihistamines at home, nyquil, etc without any improvement.   Diabetes -- currently on mounjaro 2.5 mg weekly, reports she is doing well on this medication, no side effects reported. Reviewed health maintenance, she is UTD on her labs and foot exam, reported to have completed her eye exam, A1C today is 5.7 which is well controlled.   Depression - pt reports stable symptoms on the cymbalta, denies any side effects to the medication. Reports compliance with the medication.   Flowsheet Row Video Visit from 09/28/2023 in [facility] [place]  PHQ-9 Total Score 8        Current Outpatient Medications  Medication Instructions   albuterol (VENTOLIN HFA) 108 (90 Base) MCG/ACT inhaler 2 puffs, Inhalation, Every 6 hours PRN   azelastine (ASTELIN) 0.1 % nasal spray 2 sprays, Each Nare, 2 times daily PRN, Use in each nostril as directed   Brimonidine Tartrate (LUMIFY) 0.025 % SOLN 1 drop, 2 times daily   cetirizine (ZYRTEC) 10 mg, Oral, Daily   cyclobenzaprine (FLEXERIL) 10 mg, Oral, Daily at bedtime   diclofenac Sodium (VOLTAREN) 4 g, 4 times daily   doxycycline (VIBRA-TABS) 100 mg, Oral, 2 times daily   DULoxetine (CYMBALTA) 60 mg, Oral, Daily   fluticasone (FLONASE) 50 MCG/ACT nasal spray 2 sprays, Each Nare, Daily   HYDROcodone-acetaminophen (NORCO) 10-325 MG tablet 1 tablet, Oral,  Every 6 hours PRN   lidocaine (LIDODERM) 5 % 1 patch, Every 24 hours   methylPREDNISolone (MEDROL DOSEPAK) 4 MG TBPK tablet Take package as directed.   pseudoephedrine (SUDAFED) 30-60 mg, Every 4 hours PRN   tirzepatide (MOUNJARO) 2.5 MG/0.5ML Pen INJECT 2.5 MG SUBCUTANEOUSLY WEEKLY    Patient Active Problem List   Diagnosis Date Noted   Depression, major, single episode, moderate (HCC) 03/05/2024   Recurrent sinusitis 04/30/2023   Degenerative arthritis of knee, bilateral 10/20/2021   Degenerative disc disease, cervical 07/06/2021   Lumbar arthritis 07/06/2021   Somatic dysfunction of spine, cervical 05/19/2021   ADD (attention deficit disorder) 02/28/2021   Primary osteoarthritis of right hip 06/09/2020   Osteoporosis 05/17/2020   Closed fracture of right pubis (HCC) 04/22/2020   Tear of right acetabular labrum 04/22/2020   Chronic right hip pain 04/19/2020   Diabetes mellitus type II, controlled (HCC) 02/20/2020   Hyperlipidemia associated with type 2 diabetes mellitus (HCC) 02/20/2020   Hypertension 02/03/2020   Hepatic steatosis 02/03/2020   Morbid obesity (HCC) 02/07/2018   S/P TAH-BSO (total abdominal hysterectomy and bilateral salpingo-oophorectomy) 10/17/2016   OSA (obstructive sleep apnea) 08/10/2016   Diaphragmatic hernia 05/27/2010   Vitamin D deficiency 05/23/2010   Allergic rhinitis 03/14/2010   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   Backache 01/24/2010   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     Objective:     BP 108/68   Pulse 100   Temp 98.2 F (36.8 C) (Oral)   Ht 5\' 4"  (1.626 m)   Wt 225 lb 11.2 oz (102.4 kg)   LMP 09/27/2016 (Approximate)   SpO2 100%   BMI 38.74 kg/m    Physical Exam Vitals reviewed.  Constitutional:      Appearance: Normal appearance. She is obese.  HENT:     Right  Ear: Tympanic membrane is injected, erythematous and bulging.     Left Ear: Tympanic membrane normal.     Nose: Congestion present.     Mouth/Throat:     Pharynx: Posterior oropharyngeal erythema present.  Cardiovascular:     Rate and Rhythm: Normal rate and regular rhythm.     Heart sounds: Normal heart sounds.  Pulmonary:     Effort: Pulmonary effort is normal.     Breath sounds: Wheezing (inspiratory and expiratory, scattered throughout) present.  Neurological:     Mental Status: She is alert and oriented to person, place, and time. Mental status is at baseline.      Results for orders placed or performed in visit on 02/26/24  POC HgB A1c  Result Value Ref Range   Hemoglobin A1C 5.7 (A) 4.0 - 5.6 %   HbA1c POC (<> result, manual entry)     HbA1c, POC (prediabetic range)     HbA1c, POC (controlled diabetic range)        The 10-year ASCVD risk score (Arnett DK, et al., 2019) is: 10.4%    Assessment & Plan:  Controlled type 2 diabetes mellitus without complication, without long-term current use of insulin (HCC) Assessment & Plan: A1C performed in office today and is 5.7, she is doing well with the 2.5 mg weekly mounjaro, will continue this, refills sent to pharmacy.  Orders: -     POCT glycosylated hemoglobin (Hb A1C)  Depression, major, single episode, moderate (HCC) Assessment & Plan: Chronic, well controlled on the cymbalta 60 mg daily, will refill today  Orders: -     DULoxetine HCl; Take 1 capsule (60 mg total) by mouth daily.  Dispense: 90 capsule; Refill: 1  Non-recurrent acute suppurative otitis media of right ear without spontaneous rupture of tympanic membrane  Acute bronchitis, unspecified organism -     Doxycycline Hyclate; Take 1 tablet (100 mg total) by mouth 2 (two) times daily for 10 days.  Dispense: 20 tablet; Refill: 0 -     methylPREDNISolone; Take package as directed.  Dispense: 21 each; Refill: 0 -     Albuterol Sulfate HFA; Inhale 2 puffs into  the lungs every 6 (six) hours as needed for wheezing or shortness of breath.  Dispense: 8 g; Refill: 0  Acute bronchitis and ear infection, will treat with abx, steroids and inhalers . I spent 30 mintues today on 2 chronic medical problems plus new acute problems today   Return in about 6 months (around 08/28/2024).    Karie Georges, MD

## 2024-03-05 DIAGNOSIS — F321 Major depressive disorder, single episode, moderate: Secondary | ICD-10-CM | POA: Insufficient documentation

## 2024-03-05 NOTE — Assessment & Plan Note (Signed)
 Chronic, well controlled on the cymbalta 60 mg daily, will refill today

## 2024-03-05 NOTE — Assessment & Plan Note (Signed)
 A1C performed in office today and is 5.7, she is doing well with the 2.5 mg weekly mounjaro, will continue this, refills sent to pharmacy.

## 2024-03-19 ENCOUNTER — Other Ambulatory Visit: Payer: Self-pay | Admitting: Family Medicine

## 2024-03-19 DIAGNOSIS — J209 Acute bronchitis, unspecified: Secondary | ICD-10-CM

## 2024-03-26 ENCOUNTER — Encounter: Payer: Self-pay | Admitting: Internal Medicine

## 2024-03-26 ENCOUNTER — Ambulatory Visit: Payer: 59 | Admitting: Internal Medicine

## 2024-03-26 ENCOUNTER — Other Ambulatory Visit: Payer: Self-pay

## 2024-03-26 VITALS — BP 126/72 | HR 94 | Temp 98.0°F | Wt 227.7 lb

## 2024-03-26 DIAGNOSIS — J302 Other seasonal allergic rhinitis: Secondary | ICD-10-CM | POA: Diagnosis not present

## 2024-03-26 DIAGNOSIS — H6993 Unspecified Eustachian tube disorder, bilateral: Secondary | ICD-10-CM

## 2024-03-26 DIAGNOSIS — J3089 Other allergic rhinitis: Secondary | ICD-10-CM | POA: Diagnosis not present

## 2024-03-26 MED ORDER — AZELASTINE HCL 0.1 % NA SOLN: 2.0000 | Freq: Two times a day (BID) | NASAL | 11 refills | Status: AC | PRN

## 2024-03-26 MED ORDER — CETIRIZINE HCL 10 MG PO TABS: 10.0000 mg | ORAL_TABLET | Freq: Every day | ORAL | 11 refills | Status: AC

## 2024-03-26 MED ORDER — FLUTICASONE PROPIONATE 50 MCG/ACT NA SUSP: 2.0000 | Freq: Every day | NASAL | 11 refills | Status: AC

## 2024-03-26 NOTE — Patient Instructions (Addendum)
 Allergic Rhinitis: - Positive skin test 09/2023: grasses, weeds, trees, molds, dust mites, cats, dogs.  - Use nasal saline rinses before nose sprays such as with Neilmed Sinus Rinse.  Use distilled water.   - If symptoms worsen, use Flonase 2 sprays each nostril daily. Aim upward and outward. - Use Azelastine 2 sprays each nostril twice daily as needed for runny nose, drainage, sneezing, congestion. Aim upward and outward. - Use Zyrtec 10 mg daily.  - Consider allergy shots as long term control of your symptoms by teaching your immune system to be more tolerant of your allergy triggers.  - For nasal dryness, use saline gel or vaseline.

## 2024-03-26 NOTE — Progress Notes (Signed)
   FOLLOW UP Date of Service/Encounter:  03/26/24   Subjective:  Lori [name] (DOB: 03/06/1966) is a 58 y.o. female who [facility]24 for follow up for allergic rhinitis.   History obtained from: chart review and patient. Last visit was on 11/26/2023 with me with uncontrolled allergies and Zyrtec causing sleepiness so switched to nighttime. Discussed considering AIT, information given.   Doing well overall.  Not as much congestion, drainage, runny nose. Using Zyrtec nightly and Azelastine as needed.  Stopped Flonase due to it drying out her nose.  Tried using chapstix to help with moisturizing nose.  Sometimes gets ear pain and fluid behind ears; reports seeing ENT in the past and they had discussed ear tubes with her.   Past Medical History: Past Medical History:  Diagnosis Date   ALLERGIC RHINITIS 03/14/2010   BACK PAIN 01/24/2010   Buttock pain    Constipation    Depression    Diabetes (HCC)    Fatty liver    GERD 12/01/2008   HBP (high blood pressure)    HIATAL HERNIA 05/27/2010   Hot flashes    Hyperlipidemia    Hypertrophy of tongue papillae 03/14/2010   IBS (irritable bowel syndrome)    Joint pain    Numbness and tingling of left upper and lower extremity    OSTEOARTHRITIS 12/01/2008   Osteoporosis 05/17/2020   Fragility fracture plus T score -1.8 on DEXA scan June 2021   Pneumonia    Sciatica, left side    Sleep apnea    Sleep apnea    Thigh pain    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   TOBACCO USE 12/01/2008   VITAMIN D DEFICIENCY 05/23/2010    Objective:  BP 126/72 (BP Location: Left Arm, Patient Position: Sitting, Cuff Size: Normal)   Pulse 94   Temp 98 F (36.7 C) (Temporal)   Wt 227 lb 11.2 oz (103.3 kg)   LMP 09/27/2016 (Approximate)   SpO2 97%   BMI 39.08 kg/m  Body mass index is 39.08 kg/m. Physical Exam: GEN: alert, well developed HEENT: clear conjunctiva, TM grey and translucent, nose with mild inferior  turbinate hypertrophy, pink nasal mucosa, + clear rhinorrhea, + cobblestoning HEART: regular rate and rhythm, no murmur LUNGS: clear to auscultation bilaterally, no coughing, unlabored respiration SKIN: no rashes or lesions Assessment:   1. Seasonal and perennial allergic rhinitis   2. Dysfunction of both eustachian tubes     Plan/Recommendations:  Allergic Rhinitis: ET Dysfunction - Positive skin test 09/2023: grasses, weeds, trees, molds, dust mites, cats, dogs.  - Avoidance measures discussed. - Use nasal saline rinses before nose sprays such as with Neilmed Sinus Rinse.  Use distilled water.   - If symptoms worsen, use Flonase 2 sprays each nostril daily. Aim upward and outward. - Use Azelastine 2 sprays each nostril twice daily as needed for runny nose, drainage, sneezing, congestion. Aim upward and outward. - Use Zyrtec 10 mg daily.  - Consider allergy shots as long term control of your symptoms by teaching your immune system to be more tolerant of your allergy triggers.  - For nasal dryness, use saline gel or vaseline.     Return in about 1 year (around 03/26/2025).  Alesia Morin, MD [facility]

## 2024-04-02 ENCOUNTER — Telehealth: Payer: Self-pay | Admitting: Primary Care

## 2024-04-02 NOTE — Telephone Encounter (Signed)
 Rc'd fax for CPAP mask w/[employer]. Putting in Ms. Walsh's box to sign.

## 2024-04-03 ENCOUNTER — Encounter: Payer: Self-pay | Admitting: Acute Care

## 2024-04-04 NOTE — Telephone Encounter (Signed)
 CMN signed and successfully faxed 04/04/24.

## 2024-04-25 ENCOUNTER — Other Ambulatory Visit: Payer: Self-pay | Admitting: Physical Medicine and Rehabilitation

## 2024-04-25 DIAGNOSIS — M5412 Radiculopathy, cervical region: Secondary | ICD-10-CM

## 2024-05-12 ENCOUNTER — Other Ambulatory Visit

## 2024-05-13 ENCOUNTER — Ambulatory Visit
Admission: RE | Admit: 2024-05-13 | Discharge: 2024-05-13 | Disposition: A | Discharge status: Home / Self Care | Source: Ambulatory Visit | Attending: Physical Medicine and Rehabilitation | Admitting: Physical Medicine and Rehabilitation

## 2024-05-13 DIAGNOSIS — M5412 Radiculopathy, cervical region: Secondary | ICD-10-CM

## 2024-05-13 MED ORDER — GADOPICLENOL 0.5 MMOL/ML IV SOLN
10.0000 mL | Freq: Once | INTRAVENOUS | Status: AC | PRN
  Administered 2024-05-13: 10 mL via INTRAVENOUS

## 2024-05-30 NOTE — Progress Notes (Unsigned)
 [facility] [address] [place] [address] Phone: [phone] Subjective:    I'm seeing this patient by the request  of:  Aida House, MD  CC:   [id number]  Lori [name] is a 58 y.o. female coming in with complaint of back and neck pain. OMT on 10/19/2022. Patient states   Medications patient has been prescribed:   Taking:         Reviewed prior external information including notes and imaging from previsou exam, outside providers and external EMR if available.   As well as notes that were available from care everywhere and other healthcare systems.  Past medical history, social, surgical and family history all reviewed in electronic medical record.  No pertanent information unless stated regarding to the chief complaint.   Past Medical History:  Diagnosis Date   ALLERGIC RHINITIS 03/14/2010   BACK PAIN 01/24/2010   Buttock pain    Constipation    Depression    Diabetes (HCC)    Fatty liver    GERD 12/01/2008   HBP (high blood pressure)    HIATAL HERNIA 05/27/2010   Hot flashes    Hyperlipidemia    Hypertrophy of tongue papillae 03/14/2010   IBS (irritable bowel syndrome)    Joint pain    Numbness and tingling of left upper and lower extremity    OSTEOARTHRITIS 12/01/2008   Osteoporosis 05/17/2020   Fragility fracture plus T score -1.8 on DEXA scan June 2021   Pneumonia    Sciatica, left side    Sleep apnea    Sleep apnea    Thigh pain    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 cakes)   Latex Rash   Morphine Anxiety    REACTION: hyper REACTION: hyper   Torecan [Thiethylperazine] Palpitations     Review of Systems:  No headache,  visual changes, nausea, vomiting, diarrhea, constipation, dizziness, abdominal pain, skin rash, fevers, chills, night sweats, weight loss, swollen lymph nodes, body aches, joint swelling, chest pain, shortness of breath, mood changes. POSITIVE muscle aches  Objective  Last menstrual period 09/27/2016.   General: No apparent distress alert and oriented x3 mood and affect normal, dressed appropriately.  HEENT: Pupils equal, extraocular movements intact  Respiratory: Patient's speak in full sentences and does not appear short of breath  Cardiovascular: No lower extremity edema, non tender, no erythema  Gait MSK:  Back   Osteopathic findings  C2 flexed rotated and side bent right C6 flexed rotated and side bent left T3 extended rotated and side bent right inhaled rib T9 extended rotated and side bent left L2 flexed rotated and side bent right Sacrum right on right       Assessment and Plan:  No problem-specific Assessment & Plan notes found for this encounter.    Nonallopathic problems  Decision today to treat with OMT was based on Physical Exam  After verbal consent patient was treated with HVLA, ME, FPR techniques in cervical, rib, thoracic, lumbar, and sacral  areas  Patient tolerated the procedure well with improvement in symptoms  Patient given exercises, stretches and lifestyle modifications  See medications in patient instructions if given  Patient will  follow up in 4-8 weeks             Note: This dictation was prepared with Dragon dictation along with smaller phrase technology. Any transcriptional errors that result from this process are unintentional.

## 2024-06-04 ENCOUNTER — Encounter: Payer: Self-pay | Admitting: Family Medicine

## 2024-06-04 ENCOUNTER — Ambulatory Visit: Admitting: Family Medicine

## 2024-06-04 VITALS — HR 80 | Ht 64.0 in | Wt 227.0 lb

## 2024-06-04 DIAGNOSIS — M2550 Pain in unspecified joint: Secondary | ICD-10-CM

## 2024-06-04 DIAGNOSIS — M5030 Other cervical disc degeneration, unspecified cervical region: Secondary | ICD-10-CM | POA: Diagnosis not present

## 2024-06-04 DIAGNOSIS — E559 Vitamin D deficiency, unspecified: Secondary | ICD-10-CM

## 2024-06-04 DIAGNOSIS — M51362 Other intervertebral disc degeneration, lumbar region with discogenic back pain and lower extremity pain: Secondary | ICD-10-CM

## 2024-06-04 NOTE — Assessment & Plan Note (Signed)
 Recheck labs today.

## 2024-06-04 NOTE — Assessment & Plan Note (Signed)
 Per MRI very mild progression noted.  Patient did state that there was some potential urinary incontinence but will get a UA.  Patient does not have any findings on MRI that was taken in May that is consistent with this finding.  On exam today to seems to be neurovascularly intact in the lower extremity with 4 out of 5 strength of the lower extremities but symmetric.  Deep tendon reflexes are intact.  Patient has been seeing a pain medicine specialist and having injections in the back.  Will continue to monitor.  Continue work on weight loss.  Do believe if patient could get weight back down under 200 would feel significantly better and patient will continue to work with this.  Discussed with patient at great length about the medications and with her being on chronic narcotics this can be significantly difficult.  Total time today 36 minutes

## 2024-06-04 NOTE — Patient Instructions (Addendum)
 Labs on the way out  Will send note to [employer] like a lot of your symptoms are from C4-5 but some are also coming from C7-T1 We will contact you when we get your MRI  No surgery needed  Injections still good , not likely causing urinary problems

## 2024-06-04 NOTE — Assessment & Plan Note (Signed)
 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  Patient's C4-C5 has significant nerve impingement that I think is contributing to the bilateral shoulder pain.  Concern that a possible surgical intervention is needed with some weakness noted with abduction of the shoulders.  Patient is scheduled to see neurosurgery in the relatively near future and discussed with her worsening pain to seek medical attention.  Hold on any other medication changes, discussed the potential for an epidural in the neck with patient having some radicular symptoms in the C8 distribution with Spurling's but do not think that that is the majority of her discomfort with the neck.

## 2024-06-05 LAB — COMPREHENSIVE METABOLIC PANEL WITH GFR
ALT: 29 U/L (ref 0–35)
AST: 28 U/L (ref 0–37)
Albumin: 4.4 g/dL (ref 3.5–5.2)
Alkaline Phosphatase: 66 U/L (ref 39–117)
BUN: 13 mg/dL (ref 6–23)
CO2: 28 meq/L (ref 19–32)
Calcium: 9.9 mg/dL (ref 8.4–10.5)
Chloride: 103 meq/L (ref 96–112)
Creatinine, Ser: 0.83 mg/dL (ref 0.40–1.20)
GFR: 77.22 mL/min (ref >60.00)
Glucose, Bld: 100 mg/dL — ABNORMAL HIGH (ref 70–99)
Potassium: 3.6 meq/L (ref 3.5–5.1)
Sodium: 139 meq/L (ref 135–145)
Total Bilirubin: 0.3 mg/dL (ref 0.2–1.2)
Total Protein: 7 g/dL (ref 6.0–8.3)

## 2024-06-05 LAB — IBC PANEL
Iron: 131 ug/dL (ref 42–145)
Saturation Ratios: 40.7 % (ref 20.0–50.0)
TIBC: 322 ug/dL (ref 250.0–450.0)
Transferrin: 230 mg/dL (ref 212.0–360.0)

## 2024-06-05 LAB — URINALYSIS
Bilirubin Urine: NEGATIVE
Ketones, ur: NEGATIVE
Leukocytes,Ua: NEGATIVE
Nitrite: NEGATIVE
Specific Gravity, Urine: 1.015 (ref 1.000–1.030)
Total Protein, Urine: NEGATIVE
Urine Glucose: NEGATIVE
Urobilinogen, UA: 0.2 (ref 0.0–1.0)
pH: 6.5 (ref 5.0–8.0)

## 2024-06-05 LAB — CBC WITH DIFFERENTIAL/PLATELET
Basophils Absolute: 0 10*3/uL (ref 0.0–0.1)
Basophils Relative: 0.5 % (ref 0.0–3.0)
Eosinophils Absolute: 0.3 10*3/uL (ref 0.0–0.7)
Eosinophils Relative: 3 % (ref 0.0–5.0)
HCT: 40.9 % (ref 36.0–46.0)
Hemoglobin: 13.9 g/dL (ref 12.0–15.0)
Lymphocytes Relative: 48.2 % — ABNORMAL HIGH (ref 12.0–46.0)
Lymphs Abs: 4.3 10*3/uL — ABNORMAL HIGH (ref 0.7–4.0)
MCHC: 34 g/dL (ref 30.0–36.0)
MCV: 89.7 fl (ref 78.0–100.0)
Monocytes Absolute: 0.5 10*3/uL (ref 0.1–1.0)
Monocytes Relative: 5.7 % (ref 3.0–12.0)
Neutro Abs: 3.8 10*3/uL (ref 1.4–7.7)
Neutrophils Relative %: 42.6 % — ABNORMAL LOW (ref 43.0–77.0)
Platelets: 196 10*3/uL (ref 150.0–400.0)
RBC: 4.55 Mil/uL (ref 3.87–5.11)
RDW: 14.5 % (ref 11.5–15.5)
WBC: 9 10*3/uL (ref 4.0–10.5)

## 2024-06-05 LAB — VITAMIN D 25 HYDROXY (VIT D DEFICIENCY, FRACTURES): VITD: 23.48 ng/mL — ABNORMAL LOW (ref 30.00–100.00)

## 2024-06-05 LAB — TSH: TSH: 1.24 u[IU]/mL (ref 0.35–5.50)

## 2024-06-05 LAB — FERRITIN: Ferritin: 45.7 ng/mL (ref 10.0–291.0)

## 2024-06-05 LAB — SEDIMENTATION RATE: Sed Rate: 11 mm/h (ref 0–30)

## 2024-06-05 LAB — PTH, INTACT AND CALCIUM
Calcium: 9.9 mg/dL (ref 8.6–10.4)
PTH: 24 pg/mL (ref 16–77)

## 2024-06-05 LAB — C-REACTIVE PROTEIN: CRP: <1 mg/dL (ref 0.5–20.0)

## 2024-06-05 LAB — URIC ACID: Uric Acid, Serum: 5.5 mg/dL (ref 2.4–7.0)

## 2024-06-05 LAB — VITAMIN B12: Vitamin B-12: 449 pg/mL (ref 211–911)

## 2024-06-06 ENCOUNTER — Ambulatory Visit: Payer: Self-pay | Admitting: Family Medicine

## 2024-06-07 LAB — RHEUMATOID FACTOR: Rheumatoid fact SerPl-aCnc: <10 [IU]/mL (ref <14)

## 2024-06-07 LAB — ANTI-NUCLEAR AB-TITER (ANA TITER): ANA Titer 1: 1:40 {titer} — ABNORMAL HIGH

## 2024-06-07 LAB — ANGIOTENSIN CONVERTING ENZYME: Angiotensin-Converting Enzyme: 54 U/L (ref 9–67)

## 2024-06-07 LAB — CYCLIC CITRUL PEPTIDE ANTIBODY, IGG: Cyclic Citrullin Peptide Ab: <16 U

## 2024-06-07 LAB — CALCIUM, IONIZED: Calcium, Ion: 5.4 mg/dL (ref 4.7–5.5)

## 2024-06-07 LAB — ANA: Anti Nuclear Antibody (ANA): POSITIVE — AB

## 2024-06-09 ENCOUNTER — Other Ambulatory Visit: Payer: Self-pay | Admitting: Family Medicine

## 2024-06-09 ENCOUNTER — Other Ambulatory Visit: Payer: Self-pay

## 2024-06-09 DIAGNOSIS — Z1231 Encounter for screening mammogram for malignant neoplasm of breast: Secondary | ICD-10-CM

## 2024-06-09 MED ORDER — VITAMIN D (ERGOCALCIFEROL) 1.25 MG (50000 UNIT) PO CAPS: 50000.0000 [IU] | ORAL_CAPSULE | ORAL | 0 refills | Status: DC

## 2024-06-11 ENCOUNTER — Ambulatory Visit
Admission: RE | Admit: 2024-06-11 | Discharge: 2024-06-11 | Disposition: A | Discharge status: Home / Self Care | Source: Ambulatory Visit

## 2024-06-11 DIAGNOSIS — Z1231 Encounter for screening mammogram for malignant neoplasm of breast: Secondary | ICD-10-CM

## 2024-06-12 ENCOUNTER — Ambulatory Visit (HOSPITAL_BASED_OUTPATIENT_CLINIC_OR_DEPARTMENT_OTHER)
Admission: RE | Admit: 2024-06-12 | Discharge: 2024-06-12 | Disposition: A | Discharge status: Home / Self Care | Source: Ambulatory Visit | Attending: Acute Care | Admitting: Acute Care

## 2024-06-12 ENCOUNTER — Other Ambulatory Visit: Payer: 59

## 2024-06-12 ENCOUNTER — Inpatient Hospital Stay: Payer: 59 | Attending: Oncology | Admitting: Oncology

## 2024-06-12 ENCOUNTER — Inpatient Hospital Stay: Payer: Self-pay

## 2024-06-12 VITALS — BP 142/92 | HR 80 | Temp 98.2°F | Resp 18 | Ht 64.0 in | Wt 225.0 lb

## 2024-06-12 DIAGNOSIS — G8929 Other chronic pain: Secondary | ICD-10-CM | POA: Insufficient documentation

## 2024-06-12 DIAGNOSIS — Z87891 Personal history of nicotine dependence: Secondary | ICD-10-CM | POA: Insufficient documentation

## 2024-06-12 DIAGNOSIS — D72829 Elevated white blood cell count, unspecified: Secondary | ICD-10-CM

## 2024-06-12 DIAGNOSIS — Z122 Encounter for screening for malignant neoplasm of respiratory organs: Secondary | ICD-10-CM | POA: Insufficient documentation

## 2024-06-12 DIAGNOSIS — F17210 Nicotine dependence, cigarettes, uncomplicated: Secondary | ICD-10-CM | POA: Insufficient documentation

## 2024-06-12 DIAGNOSIS — M549 Dorsalgia, unspecified: Secondary | ICD-10-CM | POA: Diagnosis not present

## 2024-06-12 DIAGNOSIS — D72820 Lymphocytosis (symptomatic): Secondary | ICD-10-CM | POA: Diagnosis present

## 2024-06-12 NOTE — Progress Notes (Signed)
  [facility] OFFICE PROGRESS NOTE   Diagnosis: Lymphocytosis  INTERVAL HISTORY:   Lori [name] returns as scheduled.  She has chronic back pain.  She is followed by a pain clinic.  She reports having an upper respiratory infection approximately 4 months ago.  No fever or night sweats.  There is a small nodule at the left occiput that varies in size.  She reports this lesion has been present for years. She continues smoking 1/2-1 pack of cigarettes per day.  She is scheduled for a screening chest CT today.  Objective:  Vital signs in last 24 hours:  Blood pressure (!) 142/92, pulse 80, temperature 98.2 F (36.8 C), temperature source Temporal, resp. rate 18, height 5' 4 (1.626 m), weight 225 lb (102.1 kg), last menstrual period 09/27/2016, SpO2 100%.    HEENT: Oropharynx without visible mass Lymphatics: 1/2-1 cm subcutaneous soft nodular lesion at the left occiput, no cervical, supraclavicular, axillary, or inguinal nodes Resp: Lungs clear bilaterally Cardio: Regular rate and rhythm GI: No hepatosplenomegaly Vascular: No leg edema   Lab Results:  Lab Results  Component Value Date   WBC 9.0 06/04/2024   HGB 13.9 06/04/2024   HCT 40.9 06/04/2024   MCV 89.7 06/04/2024   PLT 196.0 06/04/2024   NEUTROABS 3.8 06/04/2024    CMP  Lab Results  Component Value Date   NA 139 06/04/2024   K 3.6 06/04/2024   CL 103 06/04/2024   CO2 28 06/04/2024   GLUCOSE 100 (H) 06/04/2024   BUN 13 06/04/2024   CREATININE 0.83 06/04/2024   CALCIUM  9.9 06/04/2024   CALCIUM  9.9 06/04/2024   PROT 7.0 06/04/2024   ALBUMIN 4.4 06/04/2024   AST 28 06/04/2024   ALT 29 06/04/2024   ALKPHOS 66 06/04/2024   BILITOT 0.3 06/04/2024   GFRNONAA >60 05/17/2022   GFRAA 113 11/22/2020   Medications: I have reviewed the patient's current medications.   Assessment/Plan: Lymphocytosis-chronic and mild Peripheral blood flow cytometry 05/22/2022-no monoclonal B-cell population,  predominance of T cells with nonspecific changes   Ongoing tobacco use Chronic back pain Diabetes Hyperlipidemia Sleep apnea Hypertension     Disposition: Ms. Wilmott has chronic mild lymphocytosis.  The lymphocytosis is likely secondary to tobacco use.  I have a low clinical suspicion for a lymphoproliferative disorder, though she could have very early CLL. She continues smoking.  She is followed with a yearly screening chest CT.  She appears to have a small occipital lymph node versus a cyst.  She will seek medical attention if this area enlarges.  She reports she has been referred to urology to evaluate microscopic hematuria.  Ms. Manzella will continue clinical follow-up with Dr. Ozell.  She is not scheduled for follow-up appointment in the hematology clinic.  I am available to see her if she develops progressive lymphocytosis or a new hematologic abnormality.  Arley Hof, MD  06/12/2024  9:02 AM

## 2024-06-16 ENCOUNTER — Telehealth: Payer: Self-pay

## 2024-06-16 NOTE — Telephone Encounter (Signed)
 [facility] Clinical Social Work  Clinical Social Work was referred by medical provider for [employer].  Clinical Social Worker contacted patient by phone to offer support and assess for needs.     Interventions: Provided education and assistance to client regarding Advanced Directives.       Follow Up Plan:  No follow up scheduled. Patient is scheduled for Advance Directive Clinic at [facility] on 8/11    Lizbeth Sprague, [place]  Clinical Social Worker [facility]

## 2024-06-18 ENCOUNTER — Ambulatory Visit: Payer: Self-pay | Admitting: Family Medicine

## 2024-06-24 ENCOUNTER — Telehealth: Payer: Self-pay | Admitting: Acute Care

## 2024-06-24 NOTE — Telephone Encounter (Signed)
 LDCT results from 06/12/2024 exam reviewed by Lauraine Lites NP. Patient is fine to have annual scan due 06/2025. The incidental finding of a possible atrial septal defect. Will need to call the patient with results and offer pt for us  to refer the pt to cardiology to perform the echo or her PCP can order the echo and then refer to cardiology if echo confirms the defect. Patient will need to be informed this needs to evaluated. Will also need to order 12 month annual LDCT.      IMPRESSION: Lung-RADS 2S, benign appearance or behavior. Continue annual screening with low-dose chest CT without contrast in 12 months.   Mild coronary artery calcification.   Possible atrial septal defect. This could be confirmed with echocardiography if indicated.   Aortic Atherosclerosis (ICD10-I70.0).     Electronically Signed   By: Dorethia Molt M.D.   On: 06/20/2024 23:52

## 2024-06-25 NOTE — Telephone Encounter (Signed)
 Called and left VM for pt

## 2024-06-26 NOTE — Telephone Encounter (Signed)
 Returned VM. LVM to call office and review results.

## 2024-06-27 ENCOUNTER — Other Ambulatory Visit: Payer: Self-pay

## 2024-06-27 DIAGNOSIS — F17210 Nicotine dependence, cigarettes, uncomplicated: Secondary | ICD-10-CM

## 2024-06-27 DIAGNOSIS — Z87891 Personal history of nicotine dependence: Secondary | ICD-10-CM

## 2024-06-27 DIAGNOSIS — Z122 Encounter for screening for malignant neoplasm of respiratory organs: Secondary | ICD-10-CM

## 2024-07-03 ENCOUNTER — Other Ambulatory Visit: Payer: Self-pay | Admitting: Family Medicine

## 2024-07-03 DIAGNOSIS — E1165 Type 2 diabetes mellitus with hyperglycemia: Secondary | ICD-10-CM

## 2024-07-14 ENCOUNTER — Emergency Department (HOSPITAL_BASED_OUTPATIENT_CLINIC_OR_DEPARTMENT_OTHER)
Admission: EM | Admit: 2024-07-14 | Discharge: 2024-07-14 | Disposition: A | Discharge status: Home / Self Care | Attending: Emergency Medicine | Admitting: Emergency Medicine

## 2024-07-14 ENCOUNTER — Other Ambulatory Visit: Payer: Self-pay

## 2024-07-14 DIAGNOSIS — H00015 Hordeolum externum left lower eyelid: Secondary | ICD-10-CM | POA: Insufficient documentation

## 2024-07-14 DIAGNOSIS — E119 Type 2 diabetes mellitus without complications: Secondary | ICD-10-CM | POA: Insufficient documentation

## 2024-07-14 DIAGNOSIS — H5712 Ocular pain, left eye: Secondary | ICD-10-CM | POA: Diagnosis present

## 2024-07-14 DIAGNOSIS — Z91040 Latex allergy status: Secondary | ICD-10-CM | POA: Diagnosis not present

## 2024-07-14 MED ORDER — ERYTHROMYCIN 5 MG/GM OP OINT
TOPICAL_OINTMENT | Freq: Once | OPHTHALMIC | Status: AC
Start: 2024-07-14 — End: 2024-07-14
  Administered 2024-07-14: 1 via OPHTHALMIC
  Filled 2024-07-14: qty 3.5

## 2024-07-14 NOTE — ED Provider Notes (Signed)
 [facility]PART[facility] AT [facility] Provider Note   CSN: [id number] Arrival date & time: 07/14/24  1556     Patient presents with: Eye Pain   Lori [name] is a 58 y.o. female patient with past medical history of allergies, GERD, obstructive sleep apnea, well-controlled diabetes, hyperlipidemia presents emergency room with complaint of stye.  Patient reports that for the last week she has had stye of left lower eyelid.  She reports that it is painful causing some swelling to the lower eyelid.  No vision changes.  No fever associated with this.    Eye Pain       Prior to Admission medications   Medication Sig Start Date End Date Taking? Authorizing Provider  azelastine  (ASTELIN ) 0.1 % nasal spray Place 2 sprays into both nostrils 2 (two) times daily as needed for allergies or rhinitis. Use in each nostril as directed 03/26/24   Tobie Arleta SQUIBB, MD  Brimonidine Tartrate (LUMIFY) 0.025 % SOLN Place 1 drop into both eyes in the morning and at bedtime.    [provider]  cetirizine  (ZYRTEC ) 10 MG tablet Take 1 tablet (10 mg total) by mouth daily. 03/26/24   Tobie Arleta SQUIBB, MD  cyclobenzaprine  (FLEXERIL ) 10 MG tablet Take 1 tablet (10 mg total) by mouth at bedtime. 02/14/23   Ozell Heron HERO, MD  diclofenac Sodium (VOLTAREN) 1 % GEL Apply 4 g topically 4 (four) times daily. 02/26/23   [provider]  fluticasone  (FLONASE ) 50 MCG/ACT nasal spray Place 2 sprays into both nostrils daily. 03/26/24   Tobie Arleta SQUIBB, MD  HYDROcodone -acetaminophen  (NORCO) 10-325 MG tablet Take 1 tablet by mouth every 6 (six) hours as needed for severe pain. 02/14/23   Ozell Heron HERO, MD  lidocaine  (LIDODERM ) 5 % Place 1 patch onto the skin daily. Remove & Discard patch within 12 hours or as directed by MD (back/hip pain)    [provider]  modafinil (PROVIGIL) 200 MG tablet Take 200 mg by mouth daily. 02/04/24   [provider]  pseudoephedrine (SUDAFED) 30  MG tablet Take 30-60 mg by mouth every 4 (four) hours as needed (headaches).    [provider]  tirzepatide  (MOUNJARO ) 2.5 MG/0.5ML Pen INJECT 2.5 MG SUBCUTANEOUSLY WEEKLY 07/07/24   Ozell Heron HERO, MD  Vitamin D , Ergocalciferol , (DRISDOL ) 1.25 MG (50000 UNIT) CAPS capsule Take 1 capsule (50,000 Units total) by mouth every 7 (seven) days. 06/09/24   Claudene Arthea HERO, DO    Allergies: Amoxicillin , Egg-derived products, Latex, Morphine, and Torecan [thiethylperazine]    Review of Systems  Eyes:  Positive for pain.    Updated Vital Signs BP (!) 163/86   Pulse 87   Temp 97.8 F (36.6 C) (Temporal)   Resp 17   Ht 5' 5 (1.651 m)   Wt 102.1 kg   LMP 09/27/2016 (Approximate)   SpO2 100%   BMI 37.44 kg/m   Physical Exam Vitals and nursing note reviewed.  Constitutional:      General: She is not in acute distress.    Appearance: She is not toxic-appearing.  HENT:     Head: Normocephalic and atraumatic.  Eyes:     General: No scleral icterus.    Conjunctiva/sclera: Conjunctivae normal.   Cardiovascular:     Rate and Rhythm: Normal rate and regular rhythm.     Pulses: Normal pulses.     Heart sounds: Normal heart sounds.  Pulmonary:     Effort: Pulmonary effort is normal. No respiratory  distress.     Breath sounds: Normal breath sounds.  Abdominal:     General: Abdomen is flat. Bowel sounds are normal.     Palpations: Abdomen is soft.     Tenderness: There is no abdominal tenderness.  Skin:    General: Skin is warm and dry.     Findings: No lesion.  Neurological:     General: No focal deficit present.     Mental Status: She is alert and oriented to person, place, and time. Mental status is at baseline.     (all labs ordered are listed, but only abnormal results are displayed) Labs Reviewed - No data to display  EKG: None  Radiology: No results found.   Procedures   Medications Ordered in the ED - No data to display                                   Medical Decision Making Risk Prescription drug management.   This patient presents to the ED for concern of eye pain, this involves an extensive number of treatment options, and is a complaint that carries with it a high risk of complications and morbidity.  The differential diagnosis includes orbital cellulitis periorbital cellulitis, stye, corneal abrasion, conjunctivitis, acute glaucoma   Problem List / ED Course / Critical interventions / Medication management  Patient presenting with complaint of left eye pain.  She reports she has had styes in the past and this one has not gone away with warm compress at home.  She says it has been present for approximately 1 week.  On exam she has no sign of cellulitis.  She has no pain with extraocular movements.  Offered eye exam to check intraocular pressure and fluorescein dye exam to rule out corneal abrasion however patient declined.  Recommend following up with ophthalmology for recheck of symptoms.  She has been wearing make-up which probably exacerbated symptoms show she will discontinue wearing eye make-up in the meantime and continue warm compress.  Given return precautions. I have reviewed the patients home medicines and have made adjustments as needed Stable and well appearing feel appropriate for discharge with outpatient follow up.       Final diagnoses:  Hordeolum externum of left lower eyelid    ED Discharge Orders     None          Shermon Warren LOISE DEVONNA 07/14/24 1841    Dean Clarity, MD 07/14/24 WINDELL

## 2024-07-14 NOTE — ED Triage Notes (Signed)
 Pt POV reporting worsening swelling and pain of L lower eyelid, possible stye, first noticed a few days ago.

## 2024-07-14 NOTE — Discharge Instructions (Addendum)
 I would try warm compress, avoid eye make-up around the eye.  If you start having significant swelling or redness around the eye you may need to come back for antibiotic.  Otherwise please follow-up with ophthalmology if symptoms have not started to improve in about a week.  Return to emergency room with new or worsening symptoms.

## 2024-07-14 NOTE — ED Notes (Signed)
 Pt given discharge instructions and reviewed prescriptions. Opportunities given for questions. Pt verbalizes understanding. Jillyn Hidden, RN

## 2024-07-16 ENCOUNTER — Encounter: Payer: Self-pay | Admitting: Family Medicine

## 2024-07-16 DIAGNOSIS — Q211 Atrial septal defect, unspecified: Secondary | ICD-10-CM

## 2024-07-21 ENCOUNTER — Other Ambulatory Visit: Admitting: Licensed Clinical Social Worker

## 2024-07-28 ENCOUNTER — Ambulatory Visit: Admitting: Family Medicine

## 2024-07-28 ENCOUNTER — Encounter: Payer: Self-pay | Admitting: Family Medicine

## 2024-07-28 VITALS — BP 138/80 | HR 52 | Temp 97.9°F | Ht 65.0 in | Wt 225.5 lb

## 2024-07-28 DIAGNOSIS — E1165 Type 2 diabetes mellitus with hyperglycemia: Secondary | ICD-10-CM | POA: Diagnosis not present

## 2024-07-28 DIAGNOSIS — R319 Hematuria, unspecified: Secondary | ICD-10-CM | POA: Diagnosis not present

## 2024-07-28 DIAGNOSIS — F321 Major depressive disorder, single episode, moderate: Secondary | ICD-10-CM | POA: Diagnosis not present

## 2024-07-28 DIAGNOSIS — Z7985 Long-term (current) use of injectable non-insulin antidiabetic drugs: Secondary | ICD-10-CM | POA: Diagnosis not present

## 2024-07-28 LAB — POC URINALSYSI DIPSTICK (AUTOMATED)
Bilirubin, UA: NEGATIVE
Glucose, UA: NEGATIVE
Ketones, UA: NEGATIVE
Leukocytes, UA: NEGATIVE
Nitrite, UA: NEGATIVE
Protein, UA: NEGATIVE
Spec Grav, UA: 1.01 (ref 1.010–1.025)
Urobilinogen, UA: 0.2 U/dL
pH, UA: 7 (ref 5.0–8.0)

## 2024-07-28 LAB — LIPID PANEL
Cholesterol: 229 mg/dL — ABNORMAL HIGH (ref 0–200)
HDL: 55.3 mg/dL (ref >39.00)
LDL Cholesterol: 133 mg/dL — ABNORMAL HIGH (ref 0–99)
NonHDL: 174.08
Total CHOL/HDL Ratio: 4
Triglycerides: 206 mg/dL — ABNORMAL HIGH (ref 0.0–149.0)
VLDL: 41.2 mg/dL — ABNORMAL HIGH (ref 0.0–40.0)

## 2024-07-28 LAB — URINALYSIS, ROUTINE W REFLEX MICROSCOPIC
Bilirubin Urine: NEGATIVE
Ketones, ur: NEGATIVE
Leukocytes,Ua: NEGATIVE
Nitrite: NEGATIVE
Specific Gravity, Urine: <=1.005 — AB (ref 1.000–1.030)
Total Protein, Urine: NEGATIVE
Urine Glucose: NEGATIVE
Urobilinogen, UA: 0.2 (ref 0.0–1.0)
WBC, UA: NONE SEEN (ref 0–?)
pH: 7.5 (ref 5.0–8.0)

## 2024-07-28 LAB — MICROALBUMIN / CREATININE URINE RATIO
Creatinine,U: 31 mg/dL
Microalb Creat Ratio: 25.5 mg/g (ref 0.0–30.0)
Microalb, Ur: 0.8 mg/dL (ref 0.0–1.9)

## 2024-07-28 LAB — HEMOGLOBIN A1C: Hgb A1c MFr Bld: 6.1 % (ref 4.6–6.5)

## 2024-07-28 NOTE — Progress Notes (Signed)
 Established Patient Office Visit  Subjective   Patient ID: Lori [name], female    DOB: 1966/05/30  Age: 58 y.o. MRN: [id number]  Chief Complaint  Patient presents with   Medical Management of Chronic Issues   Results    Patient requests follow up from Dr Theressa visit which revealed hematuria,denies any urinary symptoms, also discuss results from back specialist-injections given and PT recommended and recently diagnosed with carpal tunnel  syndrome by hand specialist    Pt is here to discuss multiple issues.  Chronic pain-- pt states she has had her MRI's. Is seeing Dr. Cozetta for injections, states that she is continuing to see her specialists. Also reports that her carpal tunnel symptoms are returning. Patient is asking for FMLA papers to be done so that she can go to her physical therapy. States that her specialists also told her she might have small fiber polyneuropathy. I reviewed her medication list and her PDMP. Of note the patient stopped the duloxetine  for her chronic depression/chronic pain symptoms, unclear reason why.   Hematuria-- pt reports she has had small blood in her urine for years. States that she doesn't really have a lot of symptoms, states that she does have incomplete bladder emptying and some incontinence occasionally.  pt reports she had a UA that Dr. Claudene did  that had trace blood in it and needs follow up on this, she rpeorts no dysuria, no fever or chills, no pelvic pain.  Diabetes -- pt reports she is tolerating the mounjaro  well today, no GI issues, needs microalbumin and A1C today. Reviewed health maintenance today. She is also due for her lipid panel.      Flowsheet Row Office Visit from 06/12/2024 in [facility] [place]  PHQ-9 Total Score 0     Current Outpatient Medications  Medication Instructions   azelastine  (ASTELIN ) 0.1 % nasal spray 2 sprays, Each Nare, 2 times daily PRN, Use in each nostril as  directed   Brimonidine Tartrate (LUMIFY) 0.025 % SOLN 1 drop, 2 times daily   cetirizine  (ZYRTEC ) 10 mg, Oral, Daily   cyclobenzaprine  (FLEXERIL ) 10 mg, Oral, Daily at bedtime   diclofenac Sodium (VOLTAREN) 4 g, 4 times daily   fluticasone  (FLONASE ) 50 MCG/ACT nasal spray 2 sprays, Each Nare, Daily   lidocaine  (LIDODERM ) 5 % 1 patch, Every 24 hours   modafinil (PROVIGIL) 200 mg, Daily   pseudoephedrine (SUDAFED) 30-60 mg, Every 4 hours PRN   tirzepatide  (MOUNJARO ) 2.5 MG/0.5ML Pen INJECT 2.5 MG SUBCUTANEOUSLY WEEKLY   Vitamin D  (Ergocalciferol ) (DRISDOL ) 50,000 Units, Oral, Every 7 days    Patient Active Problem List   Diagnosis Date Noted   Depression, major, single episode, moderate (HCC) 03/05/2024   Recurrent sinusitis 04/30/2023   Degenerative arthritis of knee, bilateral 10/20/2021   Degenerative disc disease, cervical 07/06/2021   Lumbar arthritis 07/06/2021   Somatic dysfunction of spine, cervical 05/19/2021   ADD (attention deficit disorder) 02/28/2021   Primary osteoarthritis of right hip 06/09/2020   Osteoporosis 05/17/2020   Closed fracture of right pubis (HCC) 04/22/2020   Tear of right acetabular labrum 04/22/2020   Chronic right hip pain 04/19/2020   Diabetes mellitus type II, controlled (HCC) 02/20/2020   Hyperlipidemia associated with type 2 diabetes mellitus (HCC) 02/20/2020   Hypertension 02/03/2020   Hepatic steatosis 02/03/2020   Morbid obesity (HCC) 02/07/2018   S/P TAH-BSO (total abdominal hysterectomy and bilateral salpingo-oophorectomy) 10/17/2016   OSA (obstructive  sleep apnea) 08/10/2016   Diaphragmatic hernia 05/27/2010   Vitamin D  deficiency 05/23/2010   Allergic rhinitis 03/14/2010   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   Backache 01/24/2010   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      Objective:     BP 138/80   Pulse (!) 52   Temp 97.9 F (36.6 C) (Oral)   Ht 5' 5 (1.651 m)   Wt 225 lb 8 oz (102.3 kg)   LMP 09/27/2016 (Approximate)   SpO2 100%   BMI 37.53 kg/m    Physical Exam Vitals reviewed.  Constitutional:      Appearance: Normal appearance. She is well-groomed. She is obese.  Cardiovascular:     Rate and Rhythm: Normal rate and regular rhythm.     Pulses: Normal pulses.     Heart sounds: S1 normal and S2 normal.  Pulmonary:     Effort: Pulmonary effort is normal.     Breath sounds: Normal breath sounds and air entry.  Abdominal:     General: Bowel sounds are normal.  Musculoskeletal:     Right lower leg: No edema.     Left lower leg: No edema.  Neurological:     Mental Status: She is alert and oriented to person, place, and time. Mental status is at baseline.     Gait: Gait is intact.  Psychiatric:        Mood and Affect: Mood and affect normal.        Speech: Speech normal.        Behavior: Behavior normal.        Judgment: Judgment normal.    Urine dipstick shows positive for RBC's.  Micro exam: sending for micro    The 10-year ASCVD risk score (Arnett DK, et al., 2019) is: 15.3%    Assessment & Plan:  Hematuria, unspecified type Per the patient this is a chronic finding for her, Trace blood today on POC UA, will send for full urinalysis and microscopy to further evaluation. I counseled the patient on this and further recommendations TBD.  -     POCT Urinalysis Dipstick (Automated) -     Urinalysis, Routine w reflex microscopic; Future    Problem List Items Addressed This Visit       Unprioritized   Diabetes mellitus type II, controlled (HCC)   Doing well on the mounjaro  2.5 mg weekly, will order lipid panel and microallbumin testing today. Continue mounjaro  as prescribed      Relevant Orders   Microalbumin / creatinine urine ratio (Completed)   Hemoglobin A1c (Completed)   Lipid panel (Completed)   Depression, major,  single episode, moderate (HCC)   Pt stopped the cymbalta  for an unknown reason, however her last PHQ score is 0, will continue to monitor for symptoms. She is seeing a pain mgt specialist at this time.       Other Visit Diagnoses       Hematuria, unspecified type    -  Primary   Relevant Orders   POCT Urinalysis Dipstick (Automated) (Completed)   Urinalysis, Routine w reflex microscopic (Completed)        Return in about 6 months (around 01/28/2025) for annual physical exam.    Heron CHRISTELLA Sharper, MD

## 2024-08-01 ENCOUNTER — Other Ambulatory Visit: Admitting: Licensed Clinical Social Worker

## 2024-08-03 ENCOUNTER — Other Ambulatory Visit: Payer: Self-pay | Admitting: Family Medicine

## 2024-08-04 NOTE — Assessment & Plan Note (Signed)
 Doing well on the mounjaro  2.5 mg weekly, will order lipid panel and microallbumin testing today. Continue mounjaro  as prescribed

## 2024-08-04 NOTE — Assessment & Plan Note (Signed)
 Pt stopped the cymbalta  for an unknown reason, however her last PHQ score is 0, will continue to monitor for symptoms. She is seeing a pain mgt specialist at this time.

## 2024-08-13 ENCOUNTER — Ambulatory Visit (HOSPITAL_BASED_OUTPATIENT_CLINIC_OR_DEPARTMENT_OTHER)

## 2024-08-13 ENCOUNTER — Ambulatory Visit: Payer: Self-pay | Admitting: Family Medicine

## 2024-08-13 DIAGNOSIS — Q211 Atrial septal defect, unspecified: Secondary | ICD-10-CM

## 2024-08-13 LAB — ECHOCARDIOGRAM COMPLETE BUBBLE STUDY
Area-P 1/2: 4.86 cm2
S' Lateral: 1.91 cm

## 2024-09-05 ENCOUNTER — Encounter: Payer: Self-pay | Admitting: Family Medicine

## 2024-09-05 ENCOUNTER — Ambulatory Visit (INDEPENDENT_AMBULATORY_CARE_PROVIDER_SITE_OTHER): Admitting: Family Medicine

## 2024-09-05 VITALS — BP 140/88 | HR 92 | Temp 98.4°F | Ht 65.0 in | Wt 229.0 lb

## 2024-09-05 DIAGNOSIS — R3121 Asymptomatic microscopic hematuria: Secondary | ICD-10-CM | POA: Diagnosis not present

## 2024-09-05 DIAGNOSIS — E1165 Type 2 diabetes mellitus with hyperglycemia: Secondary | ICD-10-CM

## 2024-09-05 DIAGNOSIS — M51362 Other intervertebral disc degeneration, lumbar region with discogenic back pain and lower extremity pain: Secondary | ICD-10-CM

## 2024-09-05 DIAGNOSIS — Z0000 Encounter for general adult medical examination without abnormal findings: Secondary | ICD-10-CM | POA: Diagnosis not present

## 2024-09-05 LAB — URINALYSIS, ROUTINE W REFLEX MICROSCOPIC
Bilirubin Urine: NEGATIVE
Ketones, ur: NEGATIVE
Leukocytes,Ua: NEGATIVE
Nitrite: NEGATIVE
Specific Gravity, Urine: 1.015 (ref 1.000–1.030)
Total Protein, Urine: NEGATIVE
Urine Glucose: NEGATIVE
Urobilinogen, UA: 0.2 (ref 0.0–1.0)
pH: 7 (ref 5.0–8.0)

## 2024-09-05 MED ORDER — TIZANIDINE HCL 4 MG PO TABS: 4.0000 mg | ORAL_TABLET | Freq: Three times a day (TID) | ORAL | 5 refills | Status: AC | PRN

## 2024-09-05 MED ORDER — TIRZEPATIDE 5 MG/0.5ML ~~LOC~~ SOAJ: 5.0000 mg | SUBCUTANEOUS | 5 refills | Status: AC

## 2024-09-05 NOTE — Progress Notes (Signed)
 Complete physical exam  Patient: Lori [name]   DOB: 05-27-1966   58 y.o. Female  MRN: [id number]  Subjective:    Chief Complaint  Patient presents with   Annual Exam    Pt is not fasting    Lori [name] is a 58 y.o. female who presents today for a complete physical exam. She reports consuming a general diet. Exercise is limited by chronic pain condition. She generally feels fairly well. She reports sleeping well. She does have additional problems to discuss today. She is asking for FMLA papers to be filled out so she can attend physical therapy which was ordered by her neurosurgeon. Patient continues to have chronic neck, back and shoulder pain, is seeing pain management for this as well.   Pt had a recent UTI again, had gone to urgent care, was given cephalexin  course. Finished those 2 days ago. Took it for 5 days BID.  Most recent fall risk assessment:    04/21/2022    1:29 PM  Fall Risk   Falls in the past year? 0  Number falls in past yr: 0  Injury with Fall? 0  Risk for fall due to : No Fall Risks  Follow up Falls evaluation completed      Data saved with a previous flowsheet row definition     Most recent depression screenings:    06/12/2024    8:26 AM 09/28/2023    3:24 PM  PHQ 2/9 Scores  PHQ - 2 Score 0 1  PHQ- 9 Score 0 8    Vision:Within last year and goes to [facility] [place] or [place] and Dental: No current dental problems and Receives regular dental care  Patient Active Problem List   Diagnosis Date Noted   Depression, major, single episode, moderate (HCC) 03/05/2024   Recurrent sinusitis 04/30/2023   Degenerative arthritis of knee, bilateral 10/20/2021   Degenerative disc disease, cervical 07/06/2021   Lumbar arthritis 07/06/2021   Somatic dysfunction of spine, cervical 05/19/2021   ADD (attention deficit disorder) 02/28/2021   Primary osteoarthritis of right hip 06/09/2020   Osteoporosis 05/17/2020   Closed fracture of right pubis (HCC)  04/22/2020   Tear of right acetabular labrum 04/22/2020   Chronic right hip pain 04/19/2020   Diabetes mellitus type II, controlled (HCC) 02/20/2020   Hyperlipidemia associated with type 2 diabetes mellitus (HCC) 02/20/2020   Hypertension 02/03/2020   Hepatic steatosis 02/03/2020   Morbid obesity (HCC) 02/07/2018   S/P TAH-BSO (total abdominal hysterectomy and bilateral salpingo-oophorectomy) 10/17/2016   OSA (obstructive sleep apnea) 08/10/2016   Diaphragmatic hernia 05/27/2010   Vitamin D  deficiency 05/23/2010   Allergic rhinitis 03/14/2010   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   Backache 01/24/2010   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   TOBACCO USE 12/01/2008   GERD 12/01/2008   Osteoarthritis 12/01/2008      Patient Care Team: Ozell Heron HERO, MD as PCP - General (Family Medicine) Dow Maxwell, PT as Physical Therapist (Physical Therapy)   Outpatient Medications Prior to Visit  Medication Sig   azelastine  (ASTELIN ) 0.1 % nasal spray Place 2 sprays into both nostrils 2 (two) times daily as needed for allergies or rhinitis. Use in each nostril as directed   Brimonidine Tartrate (LUMIFY) 0.025 % SOLN Place 1 drop into both eyes in the morning and at bedtime.   cetirizine  (ZYRTEC ) 10 MG tablet Take 1 tablet (10 mg total) by mouth daily.   diclofenac Sodium (VOLTAREN) 1 % GEL Apply  4 g topically 4 (four) times daily.   fluticasone  (FLONASE ) 50 MCG/ACT nasal spray Place 2 sprays into both nostrils daily.   lidocaine  (LIDODERM ) 5 % Place 1 patch onto the skin daily. Remove & Discard patch within 12 hours or as directed by MD (back/hip pain)   modafinil (PROVIGIL) 200 MG tablet Take 200 mg by mouth daily.   pseudoephedrine (SUDAFED) 30 MG tablet Take 30-60 mg by mouth every 4 (four) hours as needed (headaches).   Vitamin D , Ergocalciferol , (DRISDOL ) 1.25 MG (50000 UNIT) CAPS capsule Take 1 capsule (50,000 Units total) by mouth every 7 (seven) days.    [DISCONTINUED] cyclobenzaprine  (FLEXERIL ) 10 MG tablet Take 1 tablet (10 mg total) by mouth at bedtime.   [DISCONTINUED] tirzepatide  (MOUNJARO ) 2.5 MG/0.5ML Pen INJECT 2.5 MG SUBCUTANEOUSLY WEEKLY   No facility-administered medications prior to visit.    Review of Systems  HENT:  Negative for hearing loss.   Eyes:  Negative for blurred vision.  Respiratory:  Negative for shortness of breath.   Cardiovascular:  Negative for chest pain.  Gastrointestinal: Negative.   Genitourinary: Negative.   Musculoskeletal:  Negative for back pain.  Neurological:  Negative for headaches.  Psychiatric/Behavioral:  Negative for depression.        Objective:     BP (!) 140/88   Pulse 92   Temp 98.4 F (36.9 C)   Ht 5' 5 (1.651 m)   Wt 229 lb (103.9 kg)   LMP 09/27/2016 (Approximate)   SpO2 97%   BMI 38.11 kg/m    Physical Exam Vitals reviewed.  Constitutional:      Appearance: Normal appearance. She is well-groomed. She is obese.  Cardiovascular:     Rate and Rhythm: Normal rate and regular rhythm.     Pulses: Normal pulses.     Heart sounds: S1 normal and S2 normal.  Pulmonary:     Effort: Pulmonary effort is normal.     Breath sounds: Normal breath sounds and air entry.  Abdominal:     General: Bowel sounds are normal.  Musculoskeletal:     Right lower leg: No edema.     Left lower leg: No edema.  Neurological:     Mental Status: She is alert and oriented to person, place, and time. Mental status is at baseline.     Gait: Gait is intact.  Psychiatric:        Mood and Affect: Mood and affect normal.        Speech: Speech normal.        Behavior: Behavior normal.        Judgment: Judgment normal.     Diabetic Foot Exam - Simple   Simple Foot Form Diabetic Foot exam was performed with the following findings: Yes 09/05/2024  9:51 AM  Visual Inspection No deformities, no ulcerations, no other skin breakdown bilaterally: Yes Sensation Testing Intact to touch and  monofilament testing bilaterally: Yes Pulse Check Posterior Tibialis and Dorsalis pulse intact bilaterally: Yes Comments      Results for orders placed or performed in visit on 09/05/24  Urinalysis, Routine w reflex microscopic  Result Value Ref Range   Color, Urine YELLOW Yellow;Lt. Yellow;Straw;Dark Yellow;Amber;Green;Red;Brown   APPearance CLEAR Clear;Turbid;Slightly Cloudy;Cloudy   Specific Gravity, Urine 1.015 1.000 - 1.030   pH 7.0 5.0 - 8.0   Total Protein, Urine NEGATIVE Negative   Urine Glucose NEGATIVE Negative   Ketones, ur NEGATIVE Negative   Bilirubin Urine NEGATIVE Negative   Hgb urine dipstick TRACE-INTACT (A)  Negative   Urobilinogen, UA 0.2 0.0 - 1.0   Leukocytes,Ua NEGATIVE Negative   Nitrite NEGATIVE Negative   WBC, UA 0-2/hpf 0-2/hpf   RBC / HPF 3-6/hpf (A) 0-2/hpf   Squamous Epithelial / HPF Rare(0-4/hpf) Rare(0-4/hpf)       Assessment & Plan:    Routine Health Maintenance and Physical Exam  Immunization History  Administered Date(s) Administered   Moderna Sars-Covid-2 Vaccination 03/11/2020, 04/10/2020   PNEUMOCOCCAL CONJUGATE-20 02/28/2021   Tdap 12/23/2013   Zoster Recombinant(Shingrix ) 02/28/2021, 08/17/2023    Health Maintenance  Topic Date Due   OPHTHALMOLOGY EXAM  07/16/2023   COVID-19 Vaccine (3 - 2025-26 season) 09/21/2024 (Originally 08/11/2024)   Influenza Vaccine  03/10/2025 (Originally 07/11/2024)   DTaP/Tdap/Td (2 - Td or Tdap) 09/05/2025 (Originally 12/24/2023)   Hepatitis B Vaccines 19-59 Average Risk (1 of 3 - 19+ 3-dose series) 09/05/2025 (Originally 02/12/1984)   HEMOGLOBIN A1C  01/28/2025   Diabetic kidney evaluation - eGFR measurement  06/04/2025   Mammogram  06/11/2025   Lung Cancer Screening  06/12/2025   Diabetic kidney evaluation - Urine ACR  07/28/2025   FOOT EXAM  09/05/2025   Colonoscopy  04/21/2029   Pneumococcal Vaccine: 50+ Years  Completed   Hepatitis C Screening  Completed   HIV Screening  Completed   Zoster  Vaccines- Shingrix   Completed   HPV VACCINES  Aged Out   Meningococcal B Vaccine  Aged Out    Discussed health benefits of physical activity, and encouraged her to engage in regular exercise appropriate for her age and condition.  Routine adult health maintenance  -- General physical exam findings are normal today. I reviewed the patient's preventative testing, immunizations, and lifestyle habits. I made appropriate recommendations and placed orders for the appropriate tests and/or vaccinations. I counseled the patient on the CDC's recommendations for healthy exercise and diet. I counseled the patient on healthy sleep habits and stress management. Handouts to reinforce the counseling were given at the conclusion of the visit.    Asymptomatic microscopic hematuria -     Urinalysis, Routine w reflex microscopic; Future  Controlled type 2 diabetes mellitus with hyperglycemia, without long-term current use of insulin (HCC) Patient would greatly benefit from weight loss therapy, I advised we begin increasing the dose of the mounjaro  to help assist with weight loss and she is agreeable. This will also improve glycemic control since she is a diabetic as well.   -     Tirzepatide ; Inject 5 mg into the skin once a week.  Dispense: 2 mL; Refill: 5  Degeneration of intervertebral disc of lumbar region with discogenic back pain and lower extremity pain -     tiZANidine  HCl; Take 1 tablet (4 mg total) by mouth every 8 (eight) hours as needed for muscle spasms.  Dispense: 90 tablet; Refill: 5    Return in about 4 months (around 01/05/2025) for DM.     Heron CHRISTELLA Sharper, MD

## 2024-09-08 ENCOUNTER — Ambulatory Visit: Payer: Self-pay | Admitting: Family Medicine

## 2024-09-08 DIAGNOSIS — R3121 Asymptomatic microscopic hematuria: Secondary | ICD-10-CM

## 2024-09-30 ENCOUNTER — Telehealth: Payer: Self-pay

## 2024-09-30 NOTE — Telephone Encounter (Signed)
 CMN received from [employer] regarding a CPAP mask cushion. This was only requiring a signature from Almarie Ferrari, NP. This was already signed. Sent for fax. NFN

## 2024-10-23 ENCOUNTER — Ambulatory Visit: Admitting: Family Medicine

## 2024-10-23 VITALS — BP 136/80 | HR 80 | Temp 98.3°F | Ht 65.0 in | Wt 220.6 lb

## 2024-10-23 DIAGNOSIS — G894 Chronic pain syndrome: Secondary | ICD-10-CM

## 2024-10-23 DIAGNOSIS — F321 Major depressive disorder, single episode, moderate: Secondary | ICD-10-CM | POA: Diagnosis not present

## 2024-10-23 MED ORDER — DULOXETINE HCL 60 MG PO CPEP: 60.0000 mg | ORAL_CAPSULE | Freq: Every day | ORAL | 1 refills | Status: AC

## 2024-10-23 MED ORDER — GABAPENTIN 300 MG PO CAPS: ORAL_CAPSULE | ORAL | 2 refills | Status: AC

## 2024-10-23 NOTE — Progress Notes (Signed)
 Established Patient Office Visit  Subjective   Patient ID: Lori [name], female    DOB: 06/29/1966  Age: 58 y.o. MRN: [id number]  Chief Complaint  Patient presents with   Pain    HPI Discussed the use of AI scribe software for clinical note transcription with the patient, who gave verbal consent to proceed.  History of Present Illness   Lori [name] is a 58 year old female with chronic back pain and arthritis who presents with persistent pain and burning sensations.  She experiences constant burning and shooting pain throughout her body, especially in her back, thighs, knees, and feet. The pain disrupts her sleep, causing frequent awakenings. Burning sensations also occur in her thighs, back, chest, and hands, sometimes with swelling. Her hands are affected by carpal tunnel syndrome.  Her condition significantly impacts her work, as she struggles to meet the demands of working over 40 hours a week. She feels overwhelmed and is considering FMLA to manage her workload.  She has a history of arthritis with MRI findings of 'bone on bone' at L2 and L5. She has undergone multiple spine surgeries and received injections with limited relief. Physical therapy and manipulation have not alleviated her pain.  She previously used Cymbalta , which helped with depression and pain but caused 'brain zaps' when doses were missed. She is hesitant to try gabapentin or Lyrica due to concerns about weight gain. She currently takes tizanidine  for muscle relaxation.      MRI cervical spine:  IMPRESSION: 1. C5-T1 ACDF without residual spinal stenosis. Moderate left neural foraminal stenosis at C7-T1. 2. Mild spinal stenosis and moderate right and mild left neural foraminal stenosis at C4-5. 3. Severe left facet arthrosis at C2-3 with a joint effusion and mild edema. Moderate left foraminal stenosis.   MRI Lumbar Spine:  Disc levels:   L1-L2:  No canal or foraminal stenosis.   L2-L3: Disc bulge  with new small right foraminal protrusion. No canal or left foraminal stenosis. New mild right foraminal stenosis.   L3-L4: Disc bulge and mild facet arthropathy. Increased mild canal stenosis. No foraminal stenosis.   L4-L5: Disc bulge with small central disc protrusion and mild facet arthropathy. Mild canal stenosis. No foraminal stenosis.   L5-S1: Prior right laminotomy. Disc bulge with endplate osteophytic ridging and mild facet arthropathy. No canal stenosis. Mild foraminal stenosis.   IMPRESSION: Multilevel degenerative changes as detailed above with mild progression since 2019. No high-grade stenosis. There is degenerative endplate marrow edema eccentric to the right at L2-L3 where there is a new small foraminal protrusion. Current Outpatient Medications  Medication Instructions   azelastine  (ASTELIN ) 0.1 % nasal spray 2 sprays, Each Nare, 2 times daily PRN, Use in each nostril as directed   Brimonidine Tartrate (LUMIFY) 0.025 % SOLN 1 drop, 2 times daily   cetirizine  (ZYRTEC ) 10 mg, Oral, Daily   diclofenac Sodium (VOLTAREN) 4 g, 4 times daily   DULoxetine  (CYMBALTA ) 60 mg, Oral, Daily   fluticasone  (FLONASE ) 50 MCG/ACT nasal spray 2 sprays, Each Nare, Daily   gabapentin (NEURONTIN) 300 MG capsule Take 1 capsule (300 mg total) by mouth at bedtime for 7 days, THEN 1 capsule (300 mg total) 2 (two) times daily for 7 days, THEN 1 capsule (300 mg total) 3 (three) times daily.   lidocaine  (LIDODERM ) 5 % 1 patch, Every 24 hours   modafinil (PROVIGIL) 200 mg, Daily   pseudoephedrine (SUDAFED) 30-60 mg, Every 4 hours PRN   tirzepatide  (MOUNJARO ) 5 mg, Subcutaneous,  Weekly   tiZANidine  (ZANAFLEX ) 4 mg, Oral, Every 8 hours PRN    Patient Active Problem List   Diagnosis Date Noted   Chronic pain syndrome 10/23/2024   Depression, major, single episode, moderate (HCC) 03/05/2024   Recurrent sinusitis 04/30/2023   Degenerative arthritis of knee, bilateral 10/20/2021   Degenerative  disc disease, cervical 07/06/2021   Lumbar arthritis 07/06/2021   Somatic dysfunction of spine, cervical 05/19/2021   ADD (attention deficit disorder) 02/28/2021   Primary osteoarthritis of right hip 06/09/2020   Osteoporosis 05/17/2020   Closed fracture of right pubis (HCC) 04/22/2020   Tear of right acetabular labrum 04/22/2020   Chronic right hip pain 04/19/2020   Diabetes mellitus type II, controlled (HCC) 02/20/2020   Hyperlipidemia associated with type 2 diabetes mellitus (HCC) 02/20/2020   Hypertension 02/03/2020   Hepatic steatosis 02/03/2020   Morbid obesity (HCC) 02/07/2018   S/P TAH-BSO (total abdominal hysterectomy and bilateral salpingo-oophorectomy) 10/17/2016   OSA (obstructive sleep apnea) 08/10/2016   Diaphragmatic hernia 05/27/2010   Vitamin D  deficiency 05/23/2010   Allergic rhinitis 03/14/2010   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   Backache 01/24/2010   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   TOBACCO USE 12/01/2008   GERD 12/01/2008   Osteoarthritis 12/01/2008     Review of Systems  All other systems reviewed and are negative.     Objective:     BP 136/80   Pulse 80   Temp 98.3 F (36.8 C) (Oral)   Ht 5' 5 (1.651 m)   Wt 220 lb 9.6 oz (100.1 kg)   LMP 09/27/2016 (Approximate)   SpO2 98%   BMI 36.71 kg/m    Physical Exam Vitals reviewed.  Constitutional:      Appearance: Normal appearance. She is obese.  Eyes:     Conjunctiva/sclera: Conjunctivae normal.  Pulmonary:     Effort: Pulmonary effort is normal.  Neurological:     Mental Status: She is alert and oriented to person, place, and time. Mental status is at baseline.  Psychiatric:        Mood and Affect: Mood normal. Affect is tearful.        Speech: Speech normal.        Cognition and Memory: Cognition normal.      No results found for any visits on 10/23/24.    The 10-year ASCVD risk score (Arnett DK, et al., 2019) is: 14.9%    Assessment & Plan:   Depression, major, single episode, moderate (HCC) -     DULoxetine  HCl; Take 1 capsule (60 mg total) by mouth daily.  Dispense: 90 capsule; Refill: 1  Chronic pain syndrome -     DULoxetine  HCl; Take 1 capsule (60 mg total) by mouth daily.  Dispense: 90 capsule; Refill: 1 -     Gabapentin; Take 1 capsule (300 mg total) by mouth at bedtime for 7 days, THEN 1 capsule (300 mg total) 2 (two) times daily for 7 days, THEN 1 capsule (300 mg total) 3 (three) times daily.  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 Symptoms include burning sensations, swelling, and shooting pains, particularly in the lower back, thighs, and feet. Previous treatments include physical therapy and steroid injections with limited relief. Differential diagnosis includes nerve-related pain due to arthritis and possible neuropathy. Discussed potential benefits of Cymbalta   and gabapentin for pain management. Cymbalta  previously improved pain and depression symptoms. Gabapentin may aid in calming nerve pain and improving sleep. Discussed potential side effects, including weight gain with gabapentin, but emphasized benefits of pain reduction and improved function. - Restart Cymbalta  for chronic pain management. - Initiate gabapentin 300 mg at bedtime, with potential increase to twice or three times daily based on response. - Consider referral to physical medicine and rehabilitation for myofascial injections. - Explore aquatic therapy options for pain management.  Depression Symptoms previously improved with Cymbalta . Current symptoms may be exacerbated by chronic pain and insomnia. - Restart Cymbalta  for depression management.  Insomnia Likely secondary to chronic pain and neuropathic symptoms. Previous use of  Cymbalta  and gabapentin may aid in improving sleep quality. - Initiate gabapentin at bedtime to aid sleep. - Restart Cymbalta  to potentially improve sleep quality.        No follow-ups on file.    Heron CHRISTELLA Sharper, MD

## 2024-11-12 ENCOUNTER — Ambulatory Visit: Admitting: Urology

## 2024-11-12 ENCOUNTER — Encounter: Payer: Self-pay | Admitting: Urology

## 2024-11-12 VITALS — BP 119/79 | HR 58

## 2024-11-12 DIAGNOSIS — R3129 Other microscopic hematuria: Secondary | ICD-10-CM

## 2024-11-12 NOTE — Patient Instructions (Signed)
 Blood in the Pee (Hematuria) in Adults: What to Know  Hematuria is blood in the pee. You may be able to see blood in the pee. In some cases, a health care provider may find blood with a test.  Blood in the pee can be caused by infections of the kidney, bladder, or the urethra. The urethra is the tube that drains pee from the bladder.  Other causes may include: Kidney stones. Infection of the prostate. Cancer. Too much calcium in the pee. Conditions that are passed from parent to child. Too much exercise. Infections can be treated with medicine. A kidney stone will usually leave your body when you pee. If infections or kidney stones didn't cause the blood in the urine, then more tests may be needed. It is very important to tell your provider about any blood in your pee, even if you have no pain or the blood stops with no treatment. Blood in the pee can be a sign of a very serious problem, such as cancer. Follow these instructions at home: Medicines Take your medicines only as told. If you were given antibiotics, take them as told. Do not stop taking them even if you start to feel better. Eating and drinking Drink more fluids as told. Aim to drink 3-4 quarts (2.8-3.8 L) a day. Avoid caffeine, tea, and carbonated drinks. These can bother the bladder. Avoid alcohol if a female because it may irritate the prostate. General instructions If you have been diagnosed with a kidney stone, strain your pee to catch the stone if told by your provider. Empty your bladder often. Avoid holding pee for a long time. If you're female, make sure that: You wipe from front to back after using the bathroom. You use each piece of toilet paper only once. You pee before and after sex. It's up to you to get the results of any tests. Ask when your results will be ready and how to get them. You may need to call or meet with your provider to get your results. Keep all follow-up visits. Your provider will need to know  about any changes or any new symptoms. Contact a health care provider if: Your symptoms don't get better after 3 days. Your symptoms get worse. You have back pain or belly pain. You have a fever or chills. You throw up or feel like you may throw up. You throw up every time you take medicine. Get help right away if: You pass blood clots in your pee. You pass out. These symptoms may be an emergency. Call 911 right away. Do not wait to see if the symptoms will go away. Do not drive yourself to the hospital. This information is not intended to replace advice given to you by your health care provider. Make sure you discuss any questions you have with your health care provider. Document Revised: 09/13/2023 Document Reviewed: 08/23/2023 Elsevier Patient Education  2024 [employer].

## 2024-11-12 NOTE — Progress Notes (Signed)
 11/12/2024 2:46 PM   BRIELE [name] January 03, 1966 [id number]  Referring provider: Ozell Heron HERO, MD [address] [place],  [place] [address]  microhematuria   HPI: Ms Lori [name] is a 58yo here for evaluation of microscopic hematuria. She has had microhematuria for the past 2-3 years. No history of nephrolithiasis. She has a 40pk year smoking hx. She works for [employer]. She denies any dysuria or gross hematuria. She has occasional urge incontinence. She has had 2 UTIs in the past year.    PMH: Past Medical History:  Diagnosis Date   ALLERGIC RHINITIS 03/14/2010   BACK PAIN 01/24/2010   Buttock pain    Constipation    Depression    Diabetes (HCC)    Fatty liver    GERD 12/01/2008   HBP (high blood pressure)    HIATAL HERNIA 05/27/2010   Hot flashes    Hyperlipidemia    Hypertrophy of tongue papillae 03/14/2010   IBS (irritable bowel syndrome)    Joint pain    Numbness and tingling of left upper and lower extremity    OSTEOARTHRITIS 12/01/2008   Osteoporosis 05/17/2020   Fragility fracture plus T score -1.8 on DEXA scan June 2021   Pneumonia    Sciatica, left side    Sleep apnea    Sleep apnea    Thigh pain    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   TOBACCO USE 12/01/2008   VITAMIN D  DEFICIENCY 05/23/2010    Surgical History: Past Surgical History:  Procedure Laterality Date   ABDOMINAL HYSTERECTOMY N/A 10/17/2016   Procedure: HYSTERECTOMY ABDOMINAL;  Surgeon: Gigi Botts, MD;  Location: [facility]; benign mass in uterus   APPENDECTOMY     BREAST BIOPSY Right    BREAST EXCISIONAL BIOPSY Left    BREAST SURGERY     lumpectomy   CARPAL TUNNEL RELEASE     CERVICAL FUSION     x2; 2007, 2011   CESAREAN SECTION  1988   COLONOSCOPY     CYSTOSCOPY N/A 10/17/2016   Procedure: CYSTOSCOPY;  Surgeon: Gigi Botts, MD;  Location: [facility];  Service: Gynecology;  Laterality: N/A;   DILATION AND CURETTAGE OF UTERUS     miscarriage   ESOPHAGEAL MANOMETRY      ESOPHAGOGASTRODUODENOSCOPY     GASTRIC FUNDOPLICATION     Nissen   HERNIA REPAIR     LAPAROSCOPIC NISSEN FUNDOPLICATION     LUMBAR DISC SURGERY     LUMBAR LAMINECTOMY  2005   SALPINGOOPHORECTOMY Bilateral 10/17/2016   Procedure: SALPINGO OOPHORECTOMY;  Surgeon: Gigi Botts, MD;  Location: [facility];  Service: Gynecology;  Laterality: Bilateral;   TONSILLECTOMY AND ADENOIDECTOMY     TOTAL HIP ARTHROPLASTY Right 05/30/2022   Procedure: RIGHT TOTAL HIP ARTHROPLASTY ANTERIOR APPROACH;  Surgeon: Sheril Coy, MD;  Location: [facility];  Service: Orthopedics;  Laterality: Right;   UPPER GASTROINTESTINAL ENDOSCOPY      Home Medications:  Allergies as of 11/12/2024       Reactions   Amoxicillin  Nausea And Vomiting, Other (See Comments)   Yeast Infection   Egg Protein-containing Drug Products Nausea And Vomiting   With scrambled or cooked eggs (can eat if baked into something i.e. cakes)   Latex Rash   Morphine Anxiety   REACTION: hyper REACTION: hyper   Torecan [thiethylperazine] Palpitations        Medication List        Accurate as of November 12, 2024  2:46 PM. If you have any questions, ask your  nurse or doctor.          azelastine  0.1 % nasal spray Commonly known as: ASTELIN  Place 2 sprays into both nostrils 2 (two) times daily as needed for allergies or rhinitis. Use in each nostril as directed   cetirizine  10 MG tablet Commonly known as: ZYRTEC  Take 1 tablet (10 mg total) by mouth daily.   diclofenac Sodium 1 % Gel Commonly known as: VOLTAREN Apply 4 g topically 4 (four) times daily.   DULoxetine  60 MG capsule Commonly known as: CYMBALTA  Take 1 capsule (60 mg total) by mouth daily.   fluticasone  50 MCG/ACT nasal spray Commonly known as: FLONASE  Place 2 sprays into both nostrils daily.   gabapentin  300 MG capsule Commonly known as: NEURONTIN  Take 1 capsule (300 mg total) by mouth at bedtime for 7 days, THEN 1 capsule (300 mg total) 2 (two) times daily for 7 days,  THEN 1 capsule (300 mg total) 3 (three) times daily. Start taking on: October 23, 2024   lidocaine  5 % Commonly known as: LIDODERM  Place 1 patch onto the skin daily. Remove & Discard patch within 12 hours or as directed by MD (back/hip pain)   Lumify 0.025 % Soln Generic drug: Brimonidine Tartrate Place 1 drop into both eyes in the morning and at bedtime.   modafinil 200 MG tablet Commonly known as: PROVIGIL Take 200 mg by mouth daily.   pseudoephedrine 30 MG tablet Commonly known as: SUDAFED Take 30-60 mg by mouth every 4 (four) hours as needed (headaches).   tirzepatide  5 MG/0.5ML Pen Commonly known as: MOUNJARO  Inject 5 mg into the skin once a week.   tiZANidine  4 MG tablet Commonly known as: Zanaflex  Take 1 tablet (4 mg total) by mouth every 8 (eight) hours as needed for muscle spasms.        Allergies:  Allergies  Allergen Reactions   Amoxicillin  Nausea And Vomiting and Other (See Comments)    Yeast Infection   Egg Protein-Containing Drug Products Nausea And Vomiting    With scrambled or cooked eggs (can eat if baked into something i.e. cakes)   Latex Rash   Morphine Anxiety    REACTION: hyper REACTION: hyper   Torecan [Thiethylperazine] Palpitations    Family History: Family History  Problem Relation Age of Onset   Breast cancer Mother 89   High blood pressure Mother    High Cholesterol Mother    Diabetes Mother    Diverticulitis Mother 41   Heart attack Mother 33       during hospitalization with diverticulitis   Heart disease Mother    Obesity Mother    Alzheimer's disease Father    Alcohol abuse Father    Sleep apnea Father    Colon polyps Half-Sister    Colonic polyp Half-Brother    Esophageal cancer Neg Hx    Colon cancer Neg Hx    Rectal cancer Neg Hx    Stomach cancer Neg Hx     Social History:  reports that she has been smoking cigarettes. She has a 36 pack-year smoking history. She has been exposed to tobacco smoke. She has never  used smokeless tobacco. She reports current alcohol use. She reports that she does not use drugs.  ROS: All other review of systems were reviewed and are negative except what is noted above in HPI  Physical Exam: BP 119/79   Pulse (!) 58   LMP 09/27/2016 (Approximate)   Constitutional:  Alert and oriented, No acute distress. HEENT: [place]  AT, moist mucus membranes.  Trachea midline, no masses. Cardiovascular: No clubbing, cyanosis, or edema. Respiratory: Normal respiratory effort, no increased work of breathing. GI: Abdomen is soft, nontender, nondistended, no abdominal masses GU: No CVA tenderness.  Lymph: No cervical or inguinal lymphadenopathy. Skin: No rashes, bruises or suspicious lesions. Neurologic: Grossly intact, no focal deficits, moving all 4 extremities. Psychiatric: Normal mood and affect.  Laboratory Data: Lab Results  Component Value Date   WBC 9.0 06/04/2024   HGB 13.9 06/04/2024   HCT 40.9 06/04/2024   MCV 89.7 06/04/2024   PLT 196.0 06/04/2024    Lab Results  Component Value Date   CREATININE 0.83 06/04/2024    No results found for: PSA  No results found for: TESTOSTERONE  Lab Results  Component Value Date   HGBA1C 6.1 07/28/2024    Urinalysis    Component Value Date/Time   COLORURINE YELLOW 09/05/2024 1005   APPEARANCEUR CLEAR 09/05/2024 1005   LABSPEC 1.015 09/05/2024 1005   PHURINE 7.0 09/05/2024 1005   GLUCOSEU NEGATIVE 09/05/2024 1005   HGBUR TRACE-INTACT (A) 09/05/2024 1005   HGBUR 1+ 07/22/2010 1011   BILIRUBINUR NEGATIVE 09/05/2024 1005   BILIRUBINUR negative 07/28/2024 0853   KETONESUR NEGATIVE 09/05/2024 1005   PROTEINUR Negative 07/28/2024 0853   PROTEINUR NEGATIVE 10/13/2007 1247   UROBILINOGEN 0.2 09/05/2024 1005   NITRITE NEGATIVE 09/05/2024 1005   LEUKOCYTESUR NEGATIVE 09/05/2024 1005    Lab Results  Component Value Date   BACTERIA RARE 10/13/2007    Pertinent Imaging:  No results found for this or any previous  visit.  No results found for this or any previous visit.  No results found for this or any previous visit.  No results found for this or any previous visit.  No results found for this or any previous visit.  No results found for this or any previous visit.  No results found for this or any previous visit.  No results found for this or any previous visit.   Assessment & Plan:    1. Microscopic hematuria (Primary) Urine cytology Ct hematuria Office cystoscopy - Urinalysis, Routine w reflex microscopic   No follow-ups on file.  Belvie Clara, MD  [facility] [place]

## 2024-11-13 LAB — CYTOLOGY, URINE

## 2024-11-13 LAB — URINALYSIS, ROUTINE W REFLEX MICROSCOPIC
Bilirubin, UA: NEGATIVE
Glucose, UA: NEGATIVE
Leukocytes,UA: NEGATIVE
Nitrite, UA: NEGATIVE
Specific Gravity, UA: 1.025 (ref 1.005–1.030)
Urobilinogen, Ur: 0.2 mg/dL (ref 0.2–1.0)
pH, UA: 6 (ref 5.0–7.5)

## 2024-11-13 LAB — MICROSCOPIC EXAMINATION: Bacteria, UA: NONE SEEN

## 2024-11-14 ENCOUNTER — Ambulatory Visit (HOSPITAL_COMMUNITY)

## 2024-11-19 ENCOUNTER — Ambulatory Visit (HOSPITAL_COMMUNITY)
Admission: RE | Admit: 2024-11-19 | Discharge: 2024-11-19 | Disposition: A | Discharge status: Home / Self Care | Source: Ambulatory Visit | Attending: Urology | Admitting: Urology

## 2024-11-19 DIAGNOSIS — R3129 Other microscopic hematuria: Secondary | ICD-10-CM | POA: Insufficient documentation

## 2024-11-19 MED ORDER — IOHEXOL 300 MG/ML  SOLN
100.0000 mL | Freq: Once | INTRAMUSCULAR | Status: AC | PRN
  Administered 2024-11-19: 100 mL via INTRAVENOUS

## 2024-11-21 ENCOUNTER — Ambulatory Visit: Admitting: Family Medicine

## 2024-12-09 ENCOUNTER — Ambulatory Visit: Admitting: Family Medicine

## 2024-12-17 ENCOUNTER — Ambulatory Visit: Admitting: Urology

## 2024-12-17 VITALS — BP 133/76 | HR 101

## 2024-12-17 DIAGNOSIS — R3129 Other microscopic hematuria: Secondary | ICD-10-CM

## 2024-12-17 LAB — URINALYSIS, ROUTINE W REFLEX MICROSCOPIC
Bilirubin, UA: NEGATIVE
Glucose, UA: NEGATIVE
Leukocytes,UA: NEGATIVE
Nitrite, UA: NEGATIVE
RBC, UA: NEGATIVE
Specific Gravity, UA: 1.015 (ref 1.005–1.030)
Urobilinogen, Ur: 1 mg/dL (ref 0.2–1.0)
pH, UA: 8 — ABNORMAL HIGH (ref 5.0–7.5)

## 2024-12-17 MED ORDER — CIPROFLOXACIN HCL 500 MG PO TABS
500.0000 mg | ORAL_TABLET | Freq: Once | ORAL | Status: AC
  Administered 2024-12-17: 500 mg via ORAL

## 2024-12-23 ENCOUNTER — Encounter: Payer: Self-pay | Admitting: Urology

## 2024-12-23 NOTE — Patient Instructions (Signed)
 Blood in the Pee (Hematuria) in Adults: What to Know  Hematuria is blood in the pee. You may be able to see blood in the pee. In some cases, a health care provider may find blood with a test.  Blood in the pee can be caused by infections of the kidney, bladder, or the urethra. The urethra is the tube that drains pee from the bladder.  Other causes may include: Kidney stones. Infection of the prostate. Cancer. Too much calcium in the pee. Conditions that are passed from parent to child. Too much exercise. Infections can be treated with medicine. A kidney stone will usually leave your body when you pee. If infections or kidney stones didn't cause the blood in the urine, then more tests may be needed. It is very important to tell your provider about any blood in your pee, even if you have no pain or the blood stops with no treatment. Blood in the pee can be a sign of a very serious problem, such as cancer. Follow these instructions at home: Medicines Take your medicines only as told. If you were given antibiotics, take them as told. Do not stop taking them even if you start to feel better. Eating and drinking Drink more fluids as told. Aim to drink 3-4 quarts (2.8-3.8 L) a day. Avoid caffeine, tea, and carbonated drinks. These can bother the bladder. Avoid alcohol if a female because it may irritate the prostate. General instructions If you have been diagnosed with a kidney stone, strain your pee to catch the stone if told by your provider. Empty your bladder often. Avoid holding pee for a long time. If you're female, make sure that: You wipe from front to back after using the bathroom. You use each piece of toilet paper only once. You pee before and after sex. It's up to you to get the results of any tests. Ask when your results will be ready and how to get them. You may need to call or meet with your provider to get your results. Keep all follow-up visits. Your provider will need to know  about any changes or any new symptoms. Contact a health care provider if: Your symptoms don't get better after 3 days. Your symptoms get worse. You have back pain or belly pain. You have a fever or chills. You throw up or feel like you may throw up. You throw up every time you take medicine. Get help right away if: You pass blood clots in your pee. You pass out. These symptoms may be an emergency. Call 911 right away. Do not wait to see if the symptoms will go away. Do not drive yourself to the hospital. This information is not intended to replace advice given to you by your health care provider. Make sure you discuss any questions you have with your health care provider. Document Revised: 09/13/2023 Document Reviewed: 08/23/2023 Elsevier Patient Education  2024 [employer].

## 2024-12-23 NOTE — Progress Notes (Signed)
" ° °  12/17/2024  CC: microhematuria   HPI: Lori [name] is a 58yo here for cystoscopy for microhematuria Blood pressure 133/76, pulse (!) 101, last menstrual period 09/27/2016. NED. A&Ox3.   No respiratory distress   Abd soft, NT, ND Normal external genitalia with patent urethral meatus  Cystoscopy Procedure Note  Patient identification was confirmed, informed consent was obtained, and patient was prepped using Betadine  solution.  Lidocaine  jelly was administered per urethral meatus.    Procedure: - Flexible cystoscope introduced, without any difficulty.   - Thorough search of the bladder revealed:    normal urethral meatus    normal urothelium    no stones    no ulcers     no tumors    no urethral polyps    no trabeculation  - Ureteral orifices were normal in position and appearance.  Post-Procedure: - Patient tolerated the procedure well  Assessment/ Plan: Followup 6 months with a UA   No follow-ups on file.  Lori Clara, MD  "

## 2025-01-05 ENCOUNTER — Ambulatory Visit: Admitting: Family Medicine

## 2025-01-06 NOTE — Progress Notes (Unsigned)
 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  Patient's C4-C5 has significant nerve impingement that I think is contributing to the bilateral shoulder pain.  Concern that a possible surgical intervention is needed with some weakness noted with abduction of the shoulders.  Patient is scheduled to see neurosurgery in the relatively near future and discussed with her worsening pain to seek medical attention.  Hold on any other medication changes, discussed the potential for an epidural in the neck with patient having some radicular symptoms in the C8 distribution with Spurling's but do not think that that is the majority of her discomfort with the neck.  Per MRI very mild progression noted. Patient did state that there was some potential urinary incontinence but will get a UA. Patient does not have any findings on MRI that was taken in May that is consistent with this finding. On exam today to seems to be neurovascularly intact in the lower extremity with 4 out of 5 strength of the lower extremities but symmetric. Deep tendon reflexes are intact. Patient has been seeing a pain medicine specialist and having injections in the back. Will continue to monitor. Continue work on weight loss. Do believe if patient could get weight back down under 200 would feel significantly better and patient will continue to work with this. Discussed with patient at great length about the medications and with her being on chronic narcotics this can be significantly difficult. Total time today 36 minutes   Updated 01/07/2025 Lori [name] is a 59 y.o. female coming in with complaint of ?       Past Medical History:  Diagnosis Date   ALLERGIC  RHINITIS 03/14/2010   BACK PAIN 01/24/2010   Buttock pain    Constipation    Depression    Diabetes (HCC)    Fatty liver    GERD 12/01/2008   HBP (high blood pressure)    HIATAL HERNIA 05/27/2010   Hot flashes    Hyperlipidemia    Hypertrophy of tongue papillae 03/14/2010   IBS (irritable bowel syndrome)    Joint pain    Numbness and tingling of left upper and lower extremity    OSTEOARTHRITIS 12/01/2008   Osteoporosis 05/17/2020   Fragility fracture plus T score -1.8 on DEXA scan June 2021   Pneumonia    Sciatica, left side    Sleep apnea    Sleep apnea    Thigh pain    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   TOBACCO USE 12/01/2008   VITAMIN D  DEFICIENCY 05/23/2010   Past Surgical History:  Procedure Laterality Date   ABDOMINAL HYSTERECTOMY N/A 10/17/2016   Procedure: HYSTERECTOMY ABDOMINAL;  Surgeon: Gigi Botts, MD;  Location: [facility]; benign mass in uterus   APPENDECTOMY     BREAST BIOPSY Right    BREAST EXCISIONAL BIOPSY Left    BREAST SURGERY     lumpectomy   CARPAL TUNNEL RELEASE     CERVICAL FUSION     x2; 2007, 2011   CESAREAN SECTION  1988   COLONOSCOPY     CYSTOSCOPY N/A 10/17/2016   Procedure: CYSTOSCOPY;  Surgeon: Gigi Botts, MD;  Location: [facility];  Service: Gynecology;  Laterality: N/A;   DILATION AND CURETTAGE  OF UTERUS     miscarriage   ESOPHAGEAL MANOMETRY     ESOPHAGOGASTRODUODENOSCOPY     GASTRIC FUNDOPLICATION     Nissen   HERNIA REPAIR     LAPAROSCOPIC NISSEN FUNDOPLICATION     LUMBAR DISC SURGERY     LUMBAR LAMINECTOMY  2005   SALPINGOOPHORECTOMY Bilateral 10/17/2016   Procedure: SALPINGO OOPHORECTOMY;  Surgeon: Gigi Botts, MD;  Location: [facility];  Service: Gynecology;  Laterality: Bilateral;   TONSILLECTOMY AND ADENOIDECTOMY     TOTAL HIP ARTHROPLASTY Right 05/30/2022   Procedure: RIGHT TOTAL HIP ARTHROPLASTY ANTERIOR APPROACH;  Surgeon: Sheril Coy, MD;  Location: [facility];  Service: Orthopedics;  Laterality: Right;   UPPER GASTROINTESTINAL ENDOSCOPY      Social History   Socioeconomic History   Marital status: Married    Spouse name: Vicenta Coup   Number of children: 1   Years of education: Not on file   Highest education level: Some college, no degree  Occupational History   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  Tobacco Use   Smoking status: Every Day    Current packs/day: 1.00    Average packs/day: 1 pack/day for 36.0 years (36.0 ttl pk-yrs)    Types: Cigarettes    Passive exposure: Current   Smokeless tobacco: Never  Vaping Use   Vaping status: Former   Devices: tried them for a month or 2  Substance and Sexual Activity   Alcohol use: Yes    Comment: social   Drug use: No   Sexual activity: Yes  Other Topics Concern   Not on file  Social History Narrative   Not on file   Social Drivers of Health   Tobacco Use: High Risk (12/23/2024)   Patient History    Smoking Tobacco Use: Every Day    Smokeless Tobacco Use: Never    Passive Exposure: Current  Financial Resource Strain: Low Risk (10/23/2024)   Overall Financial Resource Strain (CARDIA)    Difficulty of Paying Living Expenses: Not hard at all  Food Insecurity: No Food Insecurity (10/23/2024)   Epic    Worried About Programme Researcher, Broadcasting/film/video in the Last Year: Never true    Ran Out of Food in the Last Year: Never true  Transportation Needs: No Transportation Needs (10/23/2024)   Epic    Lack of Transportation (Medical): No    Lack of Transportation (Non-Medical): No  Physical Activity: Inactive (10/23/2024)   Exercise Vital Sign    Days of Exercise per Week: 0 days    Minutes of Exercise per Session: Not on file  Stress: Stress Concern Present (10/23/2024)   [employer] of Occupational Health - Occupational Stress Questionnaire    Feeling of Stress: Very much  Social Connections: Unknown (10/23/2024)   Social Connection and Isolation Panel    Frequency of Communication with Friends and Family: Once a week    Frequency of Social Gatherings  with Friends and Family: Patient declined    Attends Religious Services: More than 4 times per year    Active Member of Clubs or Organizations: Yes    Attends Banker Meetings: More than 4 times per year    Marital Status: Married  Depression (PHQ2-9): Medium Risk (10/23/2024)   Depression (PHQ2-9)    PHQ-2 Score: 7  Alcohol Screen: Low Risk (10/23/2024)   Alcohol Screen    Last Alcohol Screening Score (AUDIT): 1  Housing: Low Risk (10/23/2024)   Epic    Unable to Pay for  Housing in the Last Year: No    Number of Times Moved in the Last Year: 0    Homeless in the Last Year: No  Utilities: Low Risk (07/12/2023)   Received from [facility]   Utilities    In the past 12 months has the electric, gas, oil, or water  company threatened to shut off services in your home? : No  Health Literacy: Not on file   Allergies[1] Family History  Problem Relation Age of Onset   Breast cancer Mother 68   High blood pressure Mother    High Cholesterol Mother    Diabetes Mother    Diverticulitis Mother 99   Heart attack Mother 53       during hospitalization with diverticulitis   Heart disease Mother    Obesity Mother    Alzheimer's disease Father    Alcohol abuse Father    Sleep apnea Father    Colon polyps Half-Sister    Colonic polyp Half-Brother    Esophageal cancer Neg Hx    Colon cancer Neg Hx    Rectal cancer Neg Hx    Stomach cancer Neg Hx     Current Outpatient Medications (Endocrine & Metabolic):    tirzepatide  (MOUNJARO ) 5 MG/0.5ML Pen, Inject 5 mg into the skin once a week.  Current Outpatient Medications (Respiratory):    azelastine  (ASTELIN ) 0.1 % nasal spray, Place 2 sprays into both nostrils 2 (two) times daily as needed for allergies or rhinitis. Use in each nostril as directed   cetirizine  (ZYRTEC ) 10 MG tablet, Take 1 tablet (10 mg total) by mouth daily.   fluticasone  (FLONASE ) 50 MCG/ACT nasal spray, Place 2 sprays into both nostrils daily.    pseudoephedrine (SUDAFED) 30 MG tablet, Take 30-60 mg by mouth every 4 (four) hours as needed (headaches).  Current Outpatient Medications (Other):    Brimonidine Tartrate (LUMIFY) 0.025 % SOLN, Place 1 drop into both eyes in the morning and at bedtime.   diclofenac Sodium (VOLTAREN) 1 % GEL, Apply 4 g topically 4 (four) times daily.   DULoxetine  (CYMBALTA ) 60 MG capsule, Take 1 capsule (60 mg total) by mouth daily.   gabapentin  (NEURONTIN ) 300 MG capsule, Take 1 capsule (300 mg total) by mouth at bedtime for 7 days, THEN 1 capsule (300 mg total) 2 (two) times daily for 7 days, THEN 1 capsule (300 mg total) 3 (three) times daily.   lidocaine  (LIDODERM ) 5 %, Place 1 patch onto the skin daily. Remove & Discard patch within 12 hours or as directed by MD (back/hip pain)   modafinil (PROVIGIL) 200 MG tablet, Take 200 mg by mouth daily.   tiZANidine  (ZANAFLEX ) 4 MG tablet, Take 1 tablet (4 mg total) by mouth every 8 (eight) hours as needed for muscle spasms.   Reviewed prior external information including notes and imaging from  primary care provider As well as notes that were available from care everywhere and other healthcare systems.  Past medical history, social, surgical and family history all reviewed in electronic medical record.  No pertanent information unless stated regarding to the chief complaint.   Review of Systems:  No headache, visual changes, nausea, vomiting, diarrhea, constipation, dizziness, abdominal pain, skin rash, fevers, chills, night sweats, weight loss, swollen lymph nodes, body aches, joint swelling, chest pain, shortness of breath, mood changes. POSITIVE muscle aches  Objective  Last menstrual period 09/27/2016.   General: No apparent distress alert and oriented x3 mood and affect normal, dressed appropriately.  HEENT: Pupils equal,  extraocular movements intact  Respiratory: Patient's speak in full sentences and does not appear short of breath  Cardiovascular: No  lower extremity edema, non tender, no erythema      Impression and Recommendations:     The above documentation has been reviewed and is accurate and complete Lori CHRISTELLA Sharps, DO       [1]  Allergies Allergen Reactions   Amoxicillin  Nausea And Vomiting and Other (See Comments)    Yeast Infection   Egg Protein-Containing Drug Products Nausea And Vomiting    With scrambled or cooked eggs (can eat if baked into something i.e. cakes)   Latex Rash   Morphine Anxiety    REACTION: hyper REACTION: hyper   Torecan [Thiethylperazine] Palpitations   "

## 2025-01-07 ENCOUNTER — Ambulatory Visit: Admitting: Family Medicine

## 2025-01-15 NOTE — Progress Notes (Unsigned)
 " [name] [address] [place] [address] Phone: [phone] Subjective:   Lori [name], am serving as a scribe for Dr. Arthea Claudene.  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  Patient's C4-C5 has significant nerve impingement that I think is contributing to the bilateral shoulder pain.  Concern that a possible surgical intervention is needed with some weakness noted with abduction of the shoulders.  Patient is scheduled to see neurosurgery in the relatively near future and discussed with her worsening pain to seek medical attention.  Hold on any other medication changes, discussed the potential for an epidural in the neck with patient having some radicular symptoms in the C8 distribution with Spurling's but do not think that that is the majority of her discomfort with the neck.  Per MRI very mild progression noted. Patient did state that there was some potential urinary incontinence but will get a UA. Patient does not have any findings on MRI that was taken in May that is consistent with this finding. On exam today to seems to be neurovascularly intact in the lower extremity with 4 out of 5 strength of the lower extremities but symmetric. Deep tendon reflexes are intact. Patient has been seeing a pain medicine specialist and having injections in the back. Will continue to monitor. Continue work on weight loss. Do believe if patient could get weight back down under 200 would feel significantly better and patient will continue to work with this. Discussed with patient at great length about the medications and with her being on chronic narcotics this can be significantly difficult. Total time today 36 minutes   Updated 01/07/2025 Lori [name] is a 59 y.o. female coming in with complaint of  lumbar and cervical spine pain. Patient states since the last appointment has been having a hard time. Has been trying cymbalta  and gabapentin . Restless leg got really bad so stopped talking cymbalta  about a week ago. No time for PT. Does heat and ice for management. Most of the pain is on the R side. Shoulder blade also getting stabbing feeling. Overall stiffness and aches. L ankle having pains and sometimes locks. Worse going down.       Past Medical History:  Diagnosis Date   ALLERGIC RHINITIS 03/14/2010   BACK PAIN 01/24/2010   Buttock pain    Constipation    Depression    Diabetes (HCC)    Fatty liver    GERD 12/01/2008   HBP (high blood pressure)    HIATAL HERNIA 05/27/2010   Hot flashes    Hyperlipidemia    Hypertrophy of tongue papillae 03/14/2010   IBS (irritable bowel syndrome)    Joint pain    Numbness and tingling of left upper and lower extremity    OSTEOARTHRITIS 12/01/2008   Osteoporosis 05/17/2020   Fragility fracture plus T score -1.8 on DEXA scan June 2021   Pneumonia    Sciatica, left side    Sleep apnea    Sleep apnea    Thigh pain    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   TOBACCO USE 12/01/2008   VITAMIN D  DEFICIENCY 05/23/2010   Past Surgical History:  Procedure Laterality Date   ABDOMINAL HYSTERECTOMY N/A 10/17/2016   Procedure: HYSTERECTOMY ABDOMINAL;  Surgeon: Gigi Botts, MD;  Location: [facility]; benign mass in  uterus   APPENDECTOMY     BREAST BIOPSY Right    BREAST EXCISIONAL BIOPSY Left    BREAST SURGERY     lumpectomy   CARPAL TUNNEL RELEASE     CERVICAL FUSION     x2; 2007, 2011   CESAREAN SECTION  1988   COLONOSCOPY     CYSTOSCOPY N/A 10/17/2016   Procedure: CYSTOSCOPY;  Surgeon: Gigi Botts, MD;  Location: [facility];  Service: Gynecology;  Laterality: N/A;   DILATION AND CURETTAGE OF UTERUS     miscarriage   ESOPHAGEAL MANOMETRY     ESOPHAGOGASTRODUODENOSCOPY     GASTRIC FUNDOPLICATION     Nissen   HERNIA REPAIR     LAPAROSCOPIC NISSEN  FUNDOPLICATION     LUMBAR DISC SURGERY     LUMBAR LAMINECTOMY  2005   SALPINGOOPHORECTOMY Bilateral 10/17/2016   Procedure: SALPINGO OOPHORECTOMY;  Surgeon: Gigi Botts, MD;  Location: [facility];  Service: Gynecology;  Laterality: Bilateral;   TONSILLECTOMY AND ADENOIDECTOMY     TOTAL HIP ARTHROPLASTY Right 05/30/2022   Procedure: RIGHT TOTAL HIP ARTHROPLASTY ANTERIOR APPROACH;  Surgeon: Sheril Coy, MD;  Location: [facility];  Service: Orthopedics;  Laterality: Right;   UPPER GASTROINTESTINAL ENDOSCOPY     Social History   Socioeconomic History   Marital status: Married    Spouse name: Vicenta Coup   Number of children: 1   Years of education: Not on file   Highest education level: Some college, no degree  Occupational History   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  Tobacco Use   Smoking status: Every Day    Current packs/day: 1.00    Average packs/day: 1 pack/day for 36.0 years (36.0 ttl pk-yrs)    Types: Cigarettes    Passive exposure: Current   Smokeless tobacco: Never  Vaping Use   Vaping status: Former   Devices: tried them for a month or 2  Substance and Sexual Activity   Alcohol use: Yes    Comment: social   Drug use: No   Sexual activity: Yes  Other Topics Concern   Not on file  Social History Narrative   Not on file   Social Drivers of Health   Tobacco Use: High Risk (12/23/2024)   Patient History    Smoking Tobacco Use: Every Day    Smokeless Tobacco Use: Never    Passive Exposure: Current  Financial Resource Strain: Low Risk (10/23/2024)   Overall Financial Resource Strain (CARDIA)    Difficulty of Paying Living Expenses: Not hard at all  Food Insecurity: No Food Insecurity (10/23/2024)   Epic    Worried About Programme Researcher, Broadcasting/film/video in the Last Year: Never true    Ran Out of Food in the Last Year: Never true  Transportation Needs: No Transportation Needs (10/23/2024)   Epic    Lack of Transportation (Medical): No    Lack of Transportation  (Non-Medical): No  Physical Activity: Inactive (10/23/2024)   Exercise Vital Sign    Days of Exercise per Week: 0 days    Minutes of Exercise per Session: Not on file  Stress: Stress Concern Present (10/23/2024)   [employer] of Occupational Health - Occupational Stress Questionnaire    Feeling of Stress: Very much  Social Connections: Unknown (10/23/2024)   Social Connection and Isolation Panel    Frequency of Communication with Friends and Family: Once a week    Frequency of Social Gatherings with Friends and Family: Patient declined    Attends Religious Services:  More than 4 times per year    Active Member of Clubs or Organizations: Yes    Attends Club or Organization Meetings: More than 4 times per year    Marital Status: Married  Depression (PHQ2-9): Medium Risk (10/23/2024)   Depression (PHQ2-9)    PHQ-2 Score: 7  Alcohol Screen: Low Risk (10/23/2024)   Alcohol Screen    Last Alcohol Screening Score (AUDIT): 1  Housing: Low Risk (10/23/2024)   Epic    Unable to Pay for Housing in the Last Year: No    Number of Times Moved in the Last Year: 0    Homeless in the Last Year: No  Utilities: Low Risk (07/12/2023)   Received from [facility]   Utilities    In the past 12 months has the electric, gas, oil, or water  company threatened to shut off services in your home? : No  Health Literacy: Not on file   [Allergies]  [Allergies] Allergen Reactions   Amoxicillin  Nausea And Vomiting and Other (See Comments)    Yeast Infection   Egg Protein-Containing Drug Products Nausea And Vomiting    With scrambled or cooked eggs (can eat if baked into something i.e. cakes)   Latex Rash   Morphine Anxiety    REACTION: hyper REACTION: hyper   Torecan [Thiethylperazine] Palpitations   Family History  Problem Relation Age of Onset   Breast cancer Mother 69   High blood pressure Mother    High Cholesterol Mother    Diabetes Mother    Diverticulitis Mother 98   Heart attack  Mother 97       during hospitalization with diverticulitis   Heart disease Mother    Obesity Mother    Alzheimer's disease Father    Alcohol abuse Father    Sleep apnea Father    Colon polyps Half-Sister    Colonic polyp Half-Brother    Esophageal cancer Neg Hx    Colon cancer Neg Hx    Rectal cancer Neg Hx    Stomach cancer Neg Hx     Current Outpatient Medications (Endocrine & Metabolic):    tirzepatide  (MOUNJARO ) 5 MG/0.5ML Pen, Inject 5 mg into the skin once a week.  Current Outpatient Medications (Respiratory):    azelastine  (ASTELIN ) 0.1 % nasal spray, Place 2 sprays into both nostrils 2 (two) times daily as needed for allergies or rhinitis. Use in each nostril as directed   cetirizine  (ZYRTEC ) 10 MG tablet, Take 1 tablet (10 mg total) by mouth daily.   fluticasone  (FLONASE ) 50 MCG/ACT nasal spray, Place 2 sprays into both nostrils daily.   pseudoephedrine (SUDAFED) 30 MG tablet, Take 30-60 mg by mouth every 4 (four) hours as needed (headaches).  Current Outpatient Medications (Other):    Brimonidine Tartrate (LUMIFY) 0.025 % SOLN, Place 1 drop into both eyes in the morning and at bedtime.   diclofenac Sodium (VOLTAREN) 1 % GEL, Apply 4 g topically 4 (four) times daily.   DULoxetine  (CYMBALTA ) 60 MG capsule, Take 1 capsule (60 mg total) by mouth daily.   gabapentin  (NEURONTIN ) 300 MG capsule, Take 1 capsule (300 mg total) by mouth at bedtime for 7 days, THEN 1 capsule (300 mg total) 2 (two) times daily for 7 days, THEN 1 capsule (300 mg total) 3 (three) times daily.   lidocaine  (LIDODERM ) 5 %, Place 1 patch onto the skin daily. Remove & Discard patch within 12 hours or as directed by MD (back/hip pain)   modafinil (PROVIGIL) 200 MG tablet, Take 200  mg by mouth daily.   tiZANidine  (ZANAFLEX ) 4 MG tablet, Take 1 tablet (4 mg total) by mouth every 8 (eight) hours as needed for muscle spasms.   Reviewed prior external information including notes and imaging from  primary care  provider As well as notes that were available from care everywhere and other healthcare systems.  Past medical history, social, surgical and family history all reviewed in electronic medical record.  No pertanent information unless stated regarding to the chief complaint.   Review of Systems:  No headache, visual changes, nausea, vomiting, diarrhea, constipation, dizziness, abdominal pain, skin rash, fevers, chills, night sweats, weight loss, swollen lymph nodes, body aches, joint swelling, chest pain, shortness of breath, mood changes. POSITIVE muscle aches  Objective  Last menstrual period 09/27/2016.Blood pressure 122/72, pulse 88, height 5' 5 (1.651 m), weight 220 lb (99.8 kg), last menstrual period 09/27/2016, SpO2 96%.    General: No apparent distress alert and oriented x3 mood and affect normal, dressed appropriately.  HEENT: Pupils equal, extraocular movements intact  Respiratory: Patient's speak in full sentences and does not appear short of breath  Cardiovascular: No lower extremity edema, non tender, no erythema  Low back does have some facet arthropathy noted.  Some tenderness to palpation in the paraspinal musculature.  Neck exam does have some limited sidebending bilaterally.    Impression and Recommendations:     The above documentation has been reviewed and is accurate and complete Marli Diego M Sophiya Morello, DO   "

## 2025-01-16 ENCOUNTER — Ambulatory Visit: Admitting: Family Medicine

## 2025-01-16 VITALS — BP 122/72 | HR 88 | Ht 65.0 in | Wt 220.0 lb

## 2025-01-16 DIAGNOSIS — G894 Chronic pain syndrome: Secondary | ICD-10-CM

## 2025-01-16 DIAGNOSIS — M51362 Other intervertebral disc degeneration, lumbar region with discogenic back pain and lower extremity pain: Secondary | ICD-10-CM

## 2025-01-16 DIAGNOSIS — E559 Vitamin D deficiency, unspecified: Secondary | ICD-10-CM

## 2025-01-16 LAB — COMPREHENSIVE METABOLIC PANEL WITH GFR
ALT: 22 U/L (ref 3–35)
AST: 21 U/L (ref 5–37)
Albumin: 4.5 g/dL (ref 3.5–5.2)
Alkaline Phosphatase: 75 U/L (ref 39–117)
BUN: 10 mg/dL (ref 6–23)
CO2: 26 meq/L (ref 19–32)
Calcium: 9.5 mg/dL (ref 8.4–10.5)
Chloride: 104 meq/L (ref 96–112)
Creatinine, Ser: 0.72 mg/dL (ref 0.40–1.20)
GFR: 91.19 mL/min
Glucose, Bld: 73 mg/dL (ref 70–99)
Potassium: 3.5 meq/L (ref 3.5–5.1)
Sodium: 138 meq/L (ref 135–145)
Total Bilirubin: 0.4 mg/dL (ref 0.2–1.2)
Total Protein: 7.6 g/dL (ref 6.0–8.3)

## 2025-01-16 LAB — IBC PANEL
Iron: 79 ug/dL (ref 42–145)
Saturation Ratios: 21.1 % (ref 20.0–50.0)
TIBC: 375.2 ug/dL (ref 250.0–450.0)
Transferrin: 268 mg/dL (ref 212.0–360.0)

## 2025-01-16 LAB — CBC WITH DIFFERENTIAL/PLATELET
Basophils Absolute: 0.1 10*3/uL (ref 0.0–0.1)
Basophils Relative: 1 % (ref 0.0–3.0)
Eosinophils Absolute: 0.2 10*3/uL (ref 0.0–0.7)
Eosinophils Relative: 1.7 % (ref 0.0–5.0)
HCT: 44.7 % (ref 36.0–46.0)
Hemoglobin: 14.9 g/dL (ref 12.0–15.0)
Lymphocytes Relative: 42.2 % (ref 12.0–46.0)
Lymphs Abs: 4.4 10*3/uL — ABNORMAL HIGH (ref 0.7–4.0)
MCHC: 33.3 g/dL (ref 30.0–36.0)
MCV: 91.6 fl (ref 78.0–100.0)
Monocytes Absolute: 0.7 10*3/uL (ref 0.1–1.0)
Monocytes Relative: 6.2 % (ref 3.0–12.0)
Neutro Abs: 5.1 10*3/uL (ref 1.4–7.7)
Neutrophils Relative %: 48.9 % (ref 43.0–77.0)
Platelets: 210 10*3/uL (ref 150.0–400.0)
RBC: 4.87 Mil/uL (ref 3.87–5.11)
RDW: 14.1 % (ref 11.5–15.5)
WBC: 10.4 10*3/uL (ref 4.0–10.5)

## 2025-01-16 LAB — TSH: TSH: 1.05 u[IU]/mL (ref 0.35–5.50)

## 2025-01-16 LAB — VITAMIN D 25 HYDROXY (VIT D DEFICIENCY, FRACTURES): VITD: 19.68 ng/mL — ABNORMAL LOW (ref 30.00–100.00)

## 2025-01-16 LAB — URIC ACID: Uric Acid, Serum: 5.1 mg/dL (ref 2.4–7.0)

## 2025-01-16 LAB — VITAMIN B12: Vitamin B-12: 469 pg/mL (ref 211–911)

## 2025-01-16 LAB — FERRITIN: Ferritin: 44.7 ng/mL (ref 10.0–291.0)

## 2025-01-16 LAB — LIPASE: Lipase: 36 U/L (ref 11.0–59.0)

## 2025-01-16 LAB — SEDIMENTATION RATE: Sed Rate: 16 mm/h (ref 0–30)

## 2025-01-16 LAB — CK: Total CK: 68 U/L (ref 17–177)

## 2025-01-16 MED ORDER — METHYLPREDNISOLONE ACETATE 80 MG/ML IJ SUSP
80.0000 mg | Freq: Once | INTRAMUSCULAR | Status: AC
  Administered 2025-01-16: 80 mg via INTRAMUSCULAR

## 2025-01-16 MED ORDER — KETOROLAC TROMETHAMINE 60 MG/2ML IM SOLN
60.0000 mg | Freq: Once | INTRAMUSCULAR | Status: AC
  Administered 2025-01-16: 60 mg via INTRAMUSCULAR

## 2025-01-16 NOTE — Patient Instructions (Addendum)
 Referral to [facility] today Full cocktail injections in backside. No NSAIDs for 24 hours.  See you again in 2 months

## 2025-01-16 NOTE — Assessment & Plan Note (Signed)
Recheck values.  

## 2025-01-16 NOTE — Assessment & Plan Note (Addendum)
 Known significant arthritis and facet arthritis.  Patient has been seeing multiple providers.  Is considering seeing pain management as well.  Discussed icing regimen and home exercises, discussed which activities to do and which ones to avoid.  Increase activity slowly.  Discussed icing regimen.  Follow-up again in 6 to 8 weeks we will get nerve conduction studies for the lower extremities to further evaluate if any nerve involvement is back or any peripheral neuropathy could be potentially contributing.  Difficult to assess secondary to so many different pathologies.  Follow-up again in 6 to 8 weeks.  Toradol  and Depo-Medrol  injections given.

## 2025-03-17 ENCOUNTER — Ambulatory Visit: Admitting: Family Medicine

## 2025-03-27 ENCOUNTER — Ambulatory Visit: Admitting: Internal Medicine

## 2025-06-24 ENCOUNTER — Ambulatory Visit: Admitting: Urology
